# Patient Record
Sex: Female | Born: 1935 | Race: White | Hispanic: No | Smoking: Former smoker
Health system: Southern US, Community
[De-identification: ages and names within clinical notes are randomized; demographics above are authoritative.]

## PROBLEM LIST (undated history)

## (undated) DIAGNOSIS — M1990 Unspecified osteoarthritis, unspecified site: Secondary | ICD-10-CM

## (undated) DIAGNOSIS — E079 Disorder of thyroid, unspecified: Secondary | ICD-10-CM

## (undated) DIAGNOSIS — I639 Cerebral infarction, unspecified: Secondary | ICD-10-CM

## (undated) DIAGNOSIS — J449 Chronic obstructive pulmonary disease, unspecified: Secondary | ICD-10-CM

## (undated) DIAGNOSIS — H409 Unspecified glaucoma: Secondary | ICD-10-CM

## (undated) DIAGNOSIS — I10 Essential (primary) hypertension: Secondary | ICD-10-CM

## (undated) DIAGNOSIS — I499 Cardiac arrhythmia, unspecified: Secondary | ICD-10-CM

## (undated) DIAGNOSIS — M810 Age-related osteoporosis without current pathological fracture: Secondary | ICD-10-CM

## (undated) DIAGNOSIS — E785 Hyperlipidemia, unspecified: Secondary | ICD-10-CM

## (undated) HISTORY — PX: WRIST SURGERY: SHX841

## (undated) HISTORY — PX: COLONOSCOPY: SHX174

## (undated) HISTORY — DX: Age-related osteoporosis without current pathological fracture: M81.0

## (undated) HISTORY — DX: Unspecified glaucoma: H40.9

## (undated) HISTORY — DX: Disorder of thyroid, unspecified: E07.9

## (undated) HISTORY — DX: Chronic obstructive pulmonary disease, unspecified: J44.9

## (undated) HISTORY — DX: Unspecified osteoarthritis, unspecified site: M19.90

## (undated) HISTORY — DX: Essential (primary) hypertension: I10

## (undated) HISTORY — DX: Hyperlipidemia, unspecified: E78.5

## (undated) HISTORY — PX: OTHER SURGICAL HISTORY: SHX169

## (undated) HISTORY — DX: Cardiac arrhythmia, unspecified: I49.9

## (undated) HISTORY — PX: APPENDECTOMY: SHX54

---

## 2017-04-26 LAB — COMPREHENSIVE METABOLIC PANEL
Albumin: 4.2 (ref 3.5–5.0)
Calcium: 10.2 (ref 8.7–10.7)
GFR calc Af Amer: 85
GFR calc non Af Amer: 73
Globulin: 2.6

## 2017-04-26 LAB — CBC AND DIFFERENTIAL
HCT: 39 (ref 36–46)
Hemoglobin: 12.2 (ref 12.0–16.0)
Neutrophils Absolute: 5
WBC: 7.3

## 2017-04-26 LAB — BASIC METABOLIC PANEL
BUN: 12 (ref 4–21)
Chloride: 102 (ref 99–108)
Creatinine: 0.7 (ref 0.5–1.1)
Glucose: 90
Potassium: 4.9 (ref 3.4–5.3)
Sodium: 141 (ref 137–147)

## 2017-04-26 LAB — LIPID PANEL
Cholesterol: 139 (ref 0–200)
HDL: 55 (ref 35–70)
LDL Cholesterol: 57
Triglycerides: 133 (ref 40–160)

## 2017-04-26 LAB — CBC: RBC: 3.97 (ref 3.87–5.11)

## 2017-04-26 LAB — HEPATIC FUNCTION PANEL
ALT: 17 (ref 7–35)
AST: 30 (ref 13–35)
Bilirubin, Total: 0.3

## 2017-07-18 LAB — CBC AND DIFFERENTIAL
Neutrophils Absolute: 7
WBC: 8.4

## 2017-07-18 LAB — BASIC METABOLIC PANEL
BUN: 8 (ref 4–21)
Creatinine: 0.7 (ref 0.5–1.1)
Glucose: 94
Sodium: 140 (ref 137–147)

## 2017-07-18 LAB — HEPATIC FUNCTION PANEL: Bilirubin, Total: 0.2

## 2017-07-18 LAB — VITAMIN D 25 HYDROXY (VIT D DEFICIENCY, FRACTURES): Vit D, 25-Hydroxy: 37

## 2017-07-18 LAB — TSH: TSH: 0.4 — AB (ref 0.41–5.90)

## 2017-08-14 HISTORY — PX: UPPER GASTROINTESTINAL ENDOSCOPY: SHX188

## 2018-10-09 ENCOUNTER — Encounter: Payer: Self-pay | Admitting: Family Medicine

## 2018-10-09 ENCOUNTER — Ambulatory Visit (INDEPENDENT_AMBULATORY_CARE_PROVIDER_SITE_OTHER): Payer: Medicare Other

## 2018-10-09 ENCOUNTER — Ambulatory Visit (INDEPENDENT_AMBULATORY_CARE_PROVIDER_SITE_OTHER): Payer: Medicare Other | Admitting: Family Medicine

## 2018-10-09 VITALS — BP 148/68 | HR 73 | Temp 98.0°F | Ht 64.5 in | Wt 117.0 lb

## 2018-10-09 DIAGNOSIS — E88819 Insulin resistance, unspecified: Secondary | ICD-10-CM

## 2018-10-09 DIAGNOSIS — M546 Pain in thoracic spine: Secondary | ICD-10-CM | POA: Diagnosis not present

## 2018-10-09 DIAGNOSIS — K219 Gastro-esophageal reflux disease without esophagitis: Secondary | ICD-10-CM

## 2018-10-09 DIAGNOSIS — I10 Essential (primary) hypertension: Secondary | ICD-10-CM

## 2018-10-09 DIAGNOSIS — I351 Nonrheumatic aortic (valve) insufficiency: Secondary | ICD-10-CM

## 2018-10-09 DIAGNOSIS — E559 Vitamin D deficiency, unspecified: Secondary | ICD-10-CM | POA: Diagnosis not present

## 2018-10-09 DIAGNOSIS — E785 Hyperlipidemia, unspecified: Secondary | ICD-10-CM

## 2018-10-09 DIAGNOSIS — I4719 Other supraventricular tachycardia: Secondary | ICD-10-CM

## 2018-10-09 DIAGNOSIS — K589 Irritable bowel syndrome without diarrhea: Secondary | ICD-10-CM

## 2018-10-09 DIAGNOSIS — Z593 Problems related to living in residential institution: Secondary | ICD-10-CM

## 2018-10-09 DIAGNOSIS — I6529 Occlusion and stenosis of unspecified carotid artery: Secondary | ICD-10-CM

## 2018-10-09 DIAGNOSIS — H9193 Unspecified hearing loss, bilateral: Secondary | ICD-10-CM

## 2018-10-09 DIAGNOSIS — E8881 Metabolic syndrome: Secondary | ICD-10-CM

## 2018-10-09 DIAGNOSIS — I471 Supraventricular tachycardia: Secondary | ICD-10-CM

## 2018-10-09 DIAGNOSIS — J310 Chronic rhinitis: Secondary | ICD-10-CM

## 2018-10-09 DIAGNOSIS — G8929 Other chronic pain: Secondary | ICD-10-CM

## 2018-10-09 DIAGNOSIS — M810 Age-related osteoporosis without current pathological fracture: Secondary | ICD-10-CM

## 2018-10-09 DIAGNOSIS — R2681 Unsteadiness on feet: Secondary | ICD-10-CM

## 2018-10-09 DIAGNOSIS — Z8639 Personal history of other endocrine, nutritional and metabolic disease: Secondary | ICD-10-CM

## 2018-10-09 DIAGNOSIS — D509 Iron deficiency anemia, unspecified: Secondary | ICD-10-CM | POA: Diagnosis not present

## 2018-10-09 DIAGNOSIS — Z748 Other problems related to care provider dependency: Secondary | ICD-10-CM

## 2018-10-09 DIAGNOSIS — R5383 Other fatigue: Secondary | ICD-10-CM | POA: Diagnosis not present

## 2018-10-09 DIAGNOSIS — I361 Nonrheumatic tricuspid (valve) insufficiency: Secondary | ICD-10-CM

## 2018-10-09 DIAGNOSIS — M533 Sacrococcygeal disorders, not elsewhere classified: Secondary | ICD-10-CM

## 2018-10-09 DIAGNOSIS — F341 Dysthymic disorder: Secondary | ICD-10-CM

## 2018-10-09 LAB — CBC WITH DIFFERENTIAL/PLATELET
Basophils Absolute: 0 10*3/uL (ref 0.0–0.1)
Basophils Relative: 0.3 % (ref 0.0–3.0)
Eosinophils Absolute: 0.1 10*3/uL (ref 0.0–0.7)
Eosinophils Relative: 1.9 % (ref 0.0–5.0)
HCT: 39.2 % (ref 36.0–46.0)
Hemoglobin: 13.3 g/dL (ref 12.0–15.0)
Lymphocytes Relative: 20.3 % (ref 12.0–46.0)
Lymphs Abs: 1.6 10*3/uL (ref 0.7–4.0)
MCHC: 33.9 g/dL (ref 30.0–36.0)
MCV: 95.9 fl (ref 78.0–100.0)
Monocytes Absolute: 0.5 10*3/uL (ref 0.1–1.0)
Monocytes Relative: 6.9 % (ref 3.0–12.0)
Neutro Abs: 5.5 10*3/uL (ref 1.4–7.7)
Neutrophils Relative %: 70.6 % (ref 43.0–77.0)
Platelets: 229 10*3/uL (ref 150.0–400.0)
RBC: 4.09 Mil/uL (ref 3.87–5.11)
RDW: 13.1 % (ref 11.5–15.5)
WBC: 7.8 10*3/uL (ref 4.0–10.5)

## 2018-10-09 LAB — COMPREHENSIVE METABOLIC PANEL
ALT: 16 U/L (ref 0–35)
AST: 24 U/L (ref 0–37)
Albumin: 4.2 g/dL (ref 3.5–5.2)
Alkaline Phosphatase: 90 U/L (ref 39–117)
BUN: 19 mg/dL (ref 6–23)
CO2: 32 mEq/L (ref 19–32)
Calcium: 10.3 mg/dL (ref 8.4–10.5)
Chloride: 102 mEq/L (ref 96–112)
Creatinine, Ser: 0.81 mg/dL (ref 0.40–1.20)
GFR: 66.05 mL/min (ref >60.00)
Glucose, Bld: 95 mg/dL (ref 70–99)
Potassium: 4.1 mEq/L (ref 3.5–5.1)
Sodium: 140 mEq/L (ref 135–145)
Total Bilirubin: 0.3 mg/dL (ref 0.2–1.2)
Total Protein: 6.8 g/dL (ref 6.0–8.3)

## 2018-10-09 LAB — LIPID PANEL
Cholesterol: 160 mg/dL (ref 0–200)
HDL: 62.2 mg/dL (ref >39.00)
LDL Cholesterol: 66 mg/dL (ref 0–99)
NonHDL: 98.14
Total CHOL/HDL Ratio: 3
Triglycerides: 159 mg/dL — ABNORMAL HIGH (ref 0.0–149.0)
VLDL: 31.8 mg/dL (ref 0.0–40.0)

## 2018-10-09 IMAGING — DX DG THORACIC SPINE 3V
3 series · 3 of 3 positions shown · non-contrast
Comparison: None.

CLINICAL DATA: Chronic thoracic back pain.

EXAM:
THORACIC SPINE - 3 VIEWS

[thoracic spine ap]
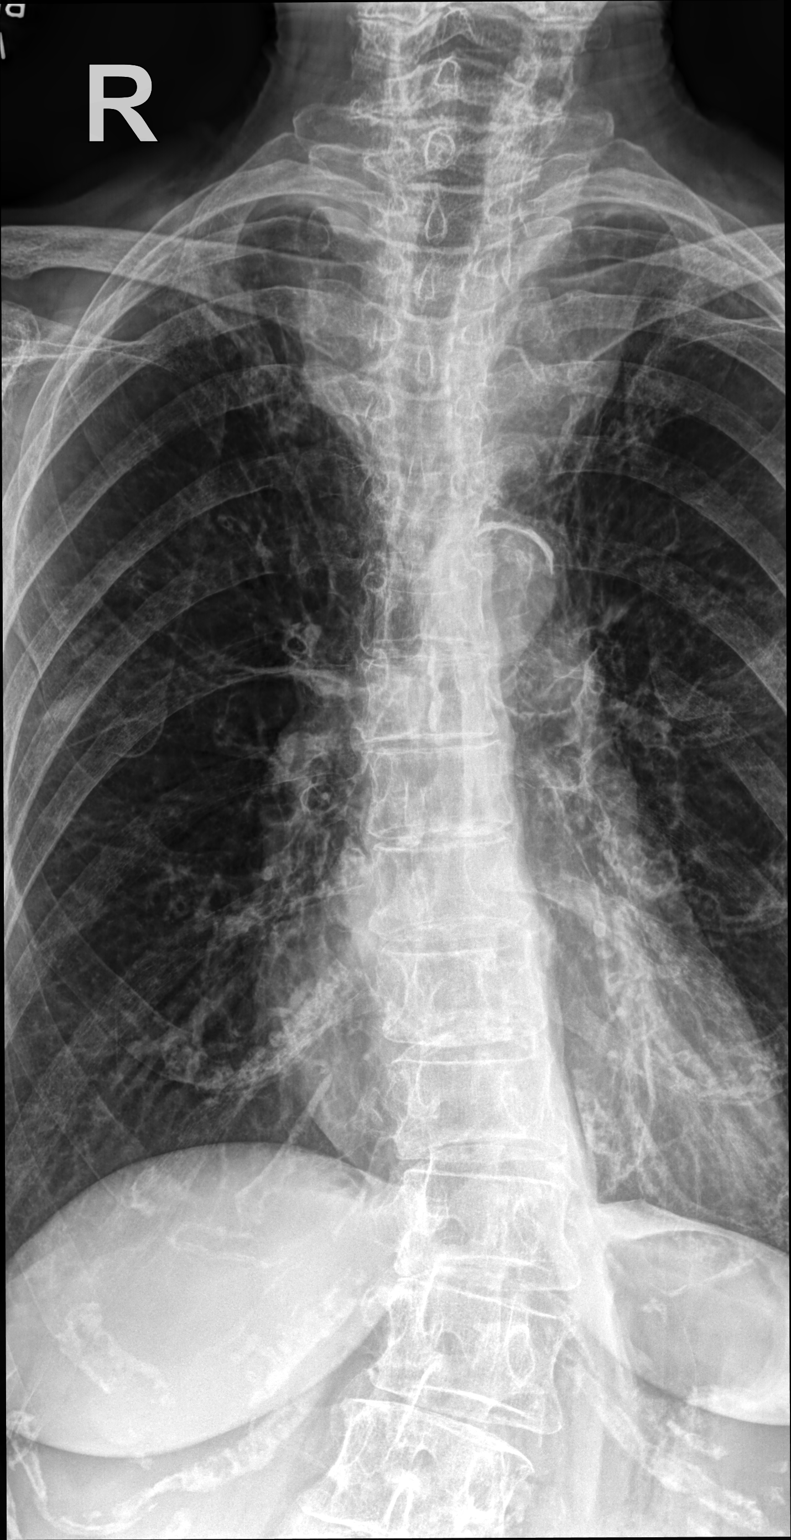

[thoracic spine lat (1 of 2)]
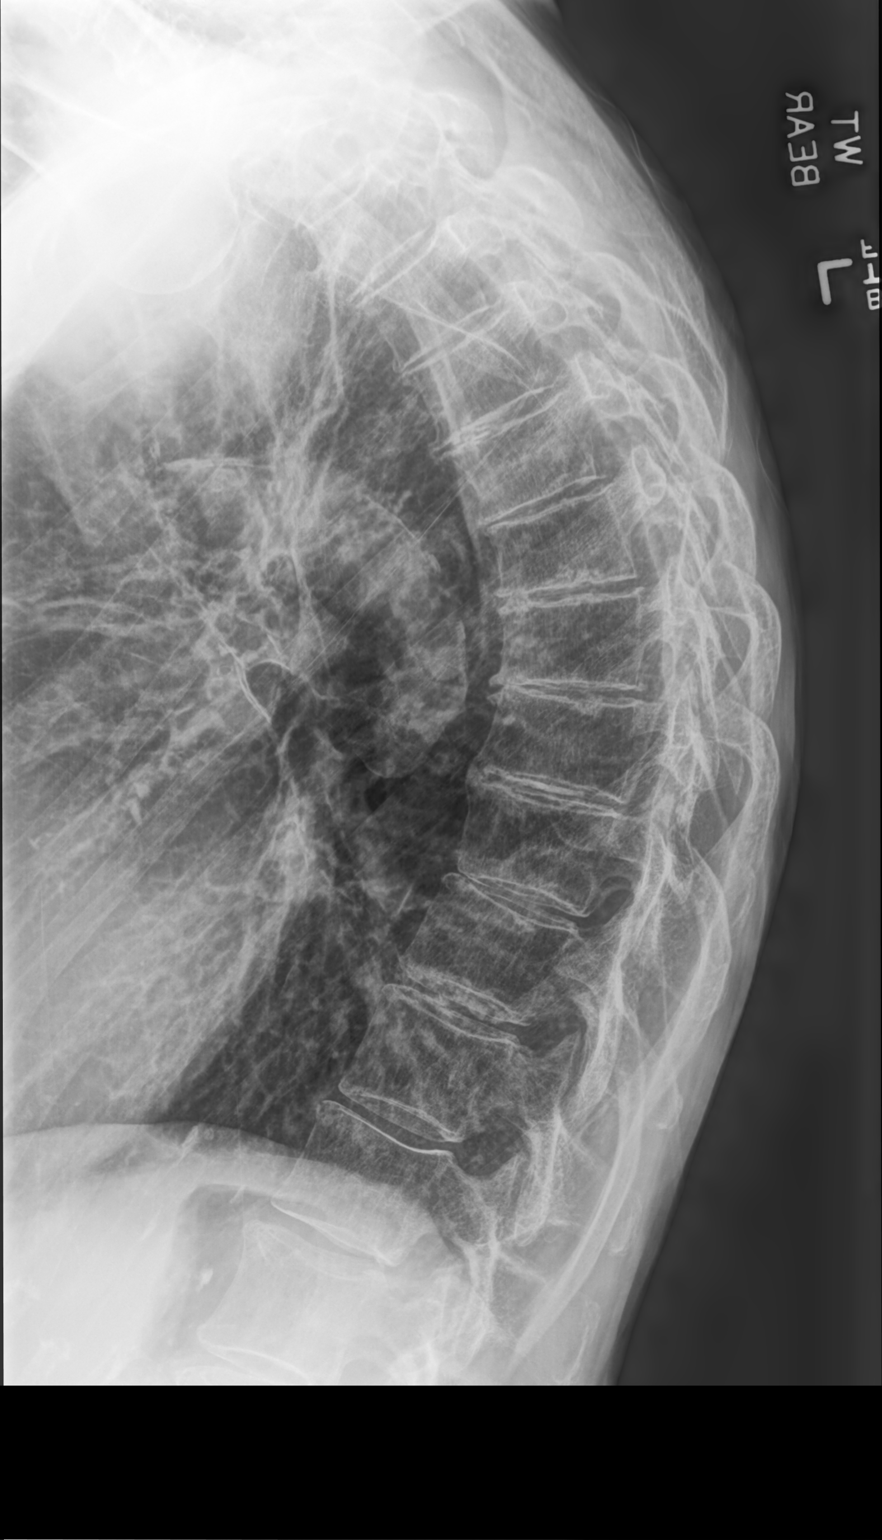

[thoracic spine lat (2 of 2)]
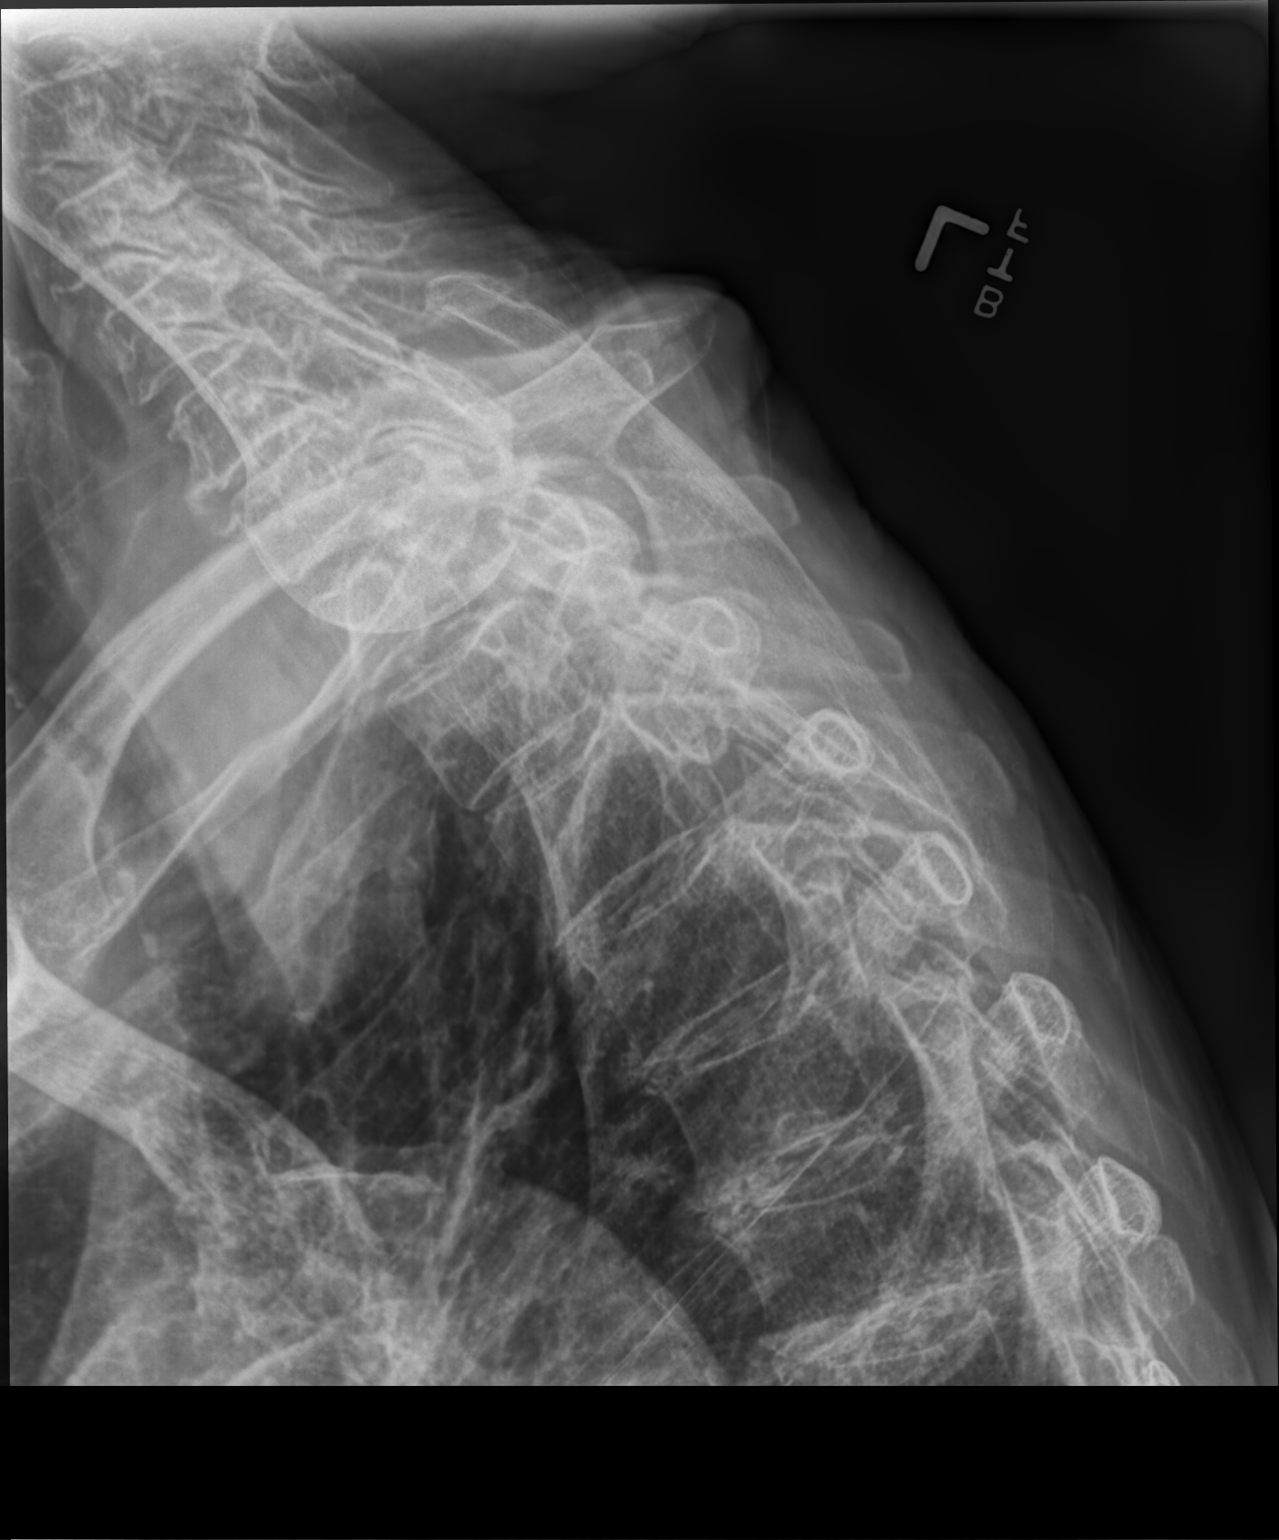

[3 of 3 positions shown; findings below may reference images not displayed]

FINDINGS: No fracture or spondylolisthesis is noted. Multilevel degenerative
disc disease is noted in midthoracic spine. Mild dextroscoliosis is
noted.
IMPRESSION: Mild multilevel degenerative disc disease. No acute abnormality seen
in the thoracic spine.

## 2018-10-09 IMAGING — DX DG LUMBAR SPINE 2-3V
3 series · 3 of 3 positions shown · non-contrast
Comparison: None.

CLINICAL DATA: Chronic lower back pain without known injury.

EXAM:
LUMBAR SPINE - 2-3 VIEW

[lumbar spine ap]
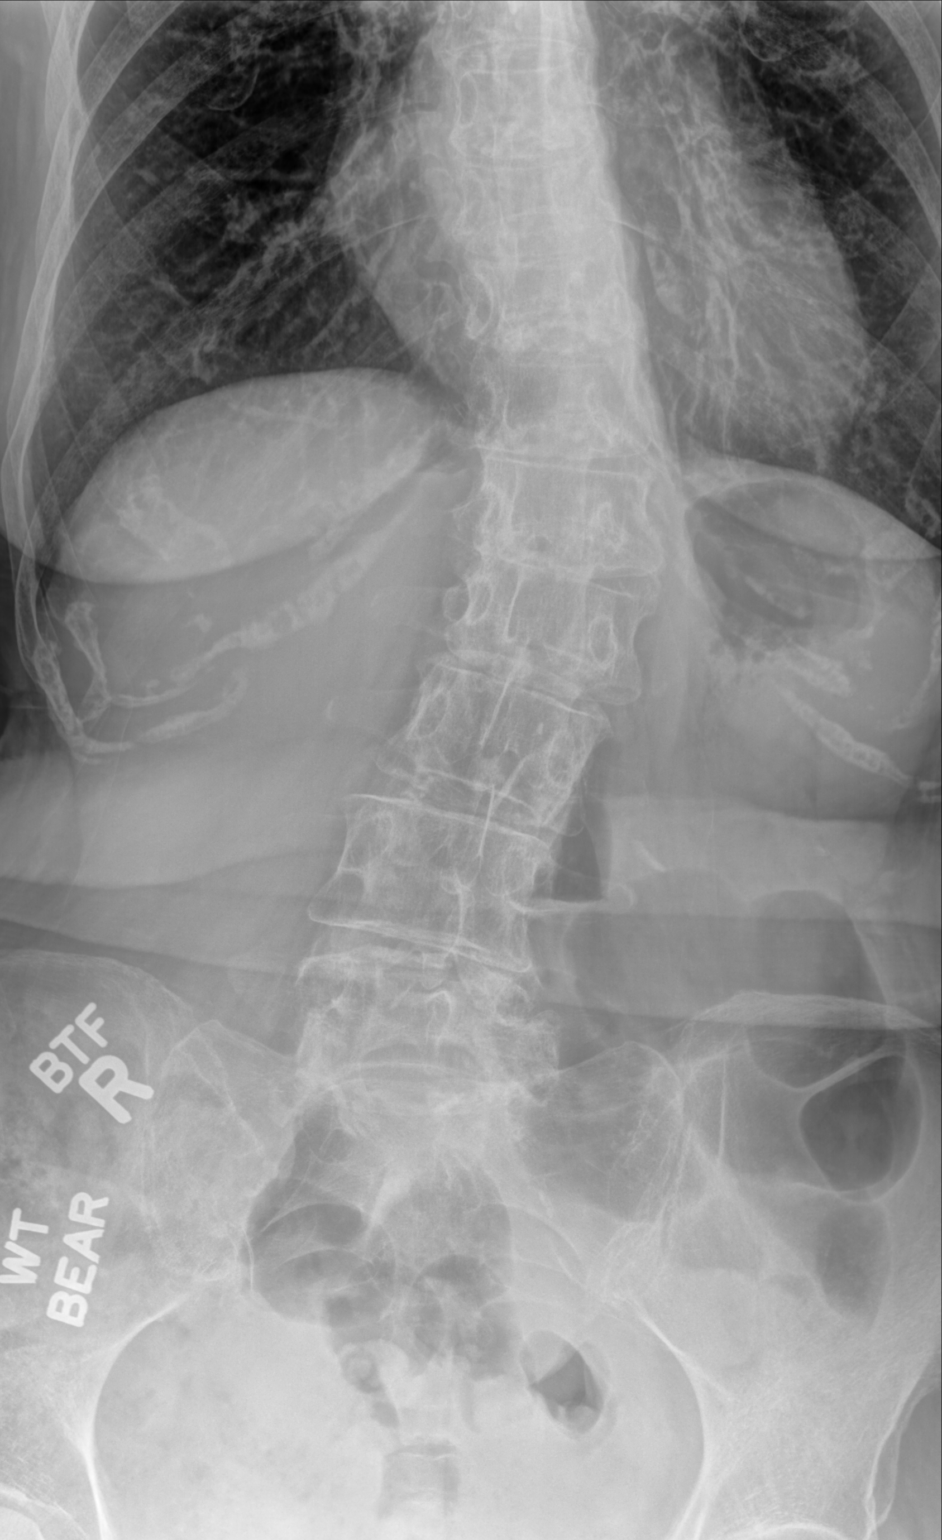

[lumbar spine lat (1 of 2)]
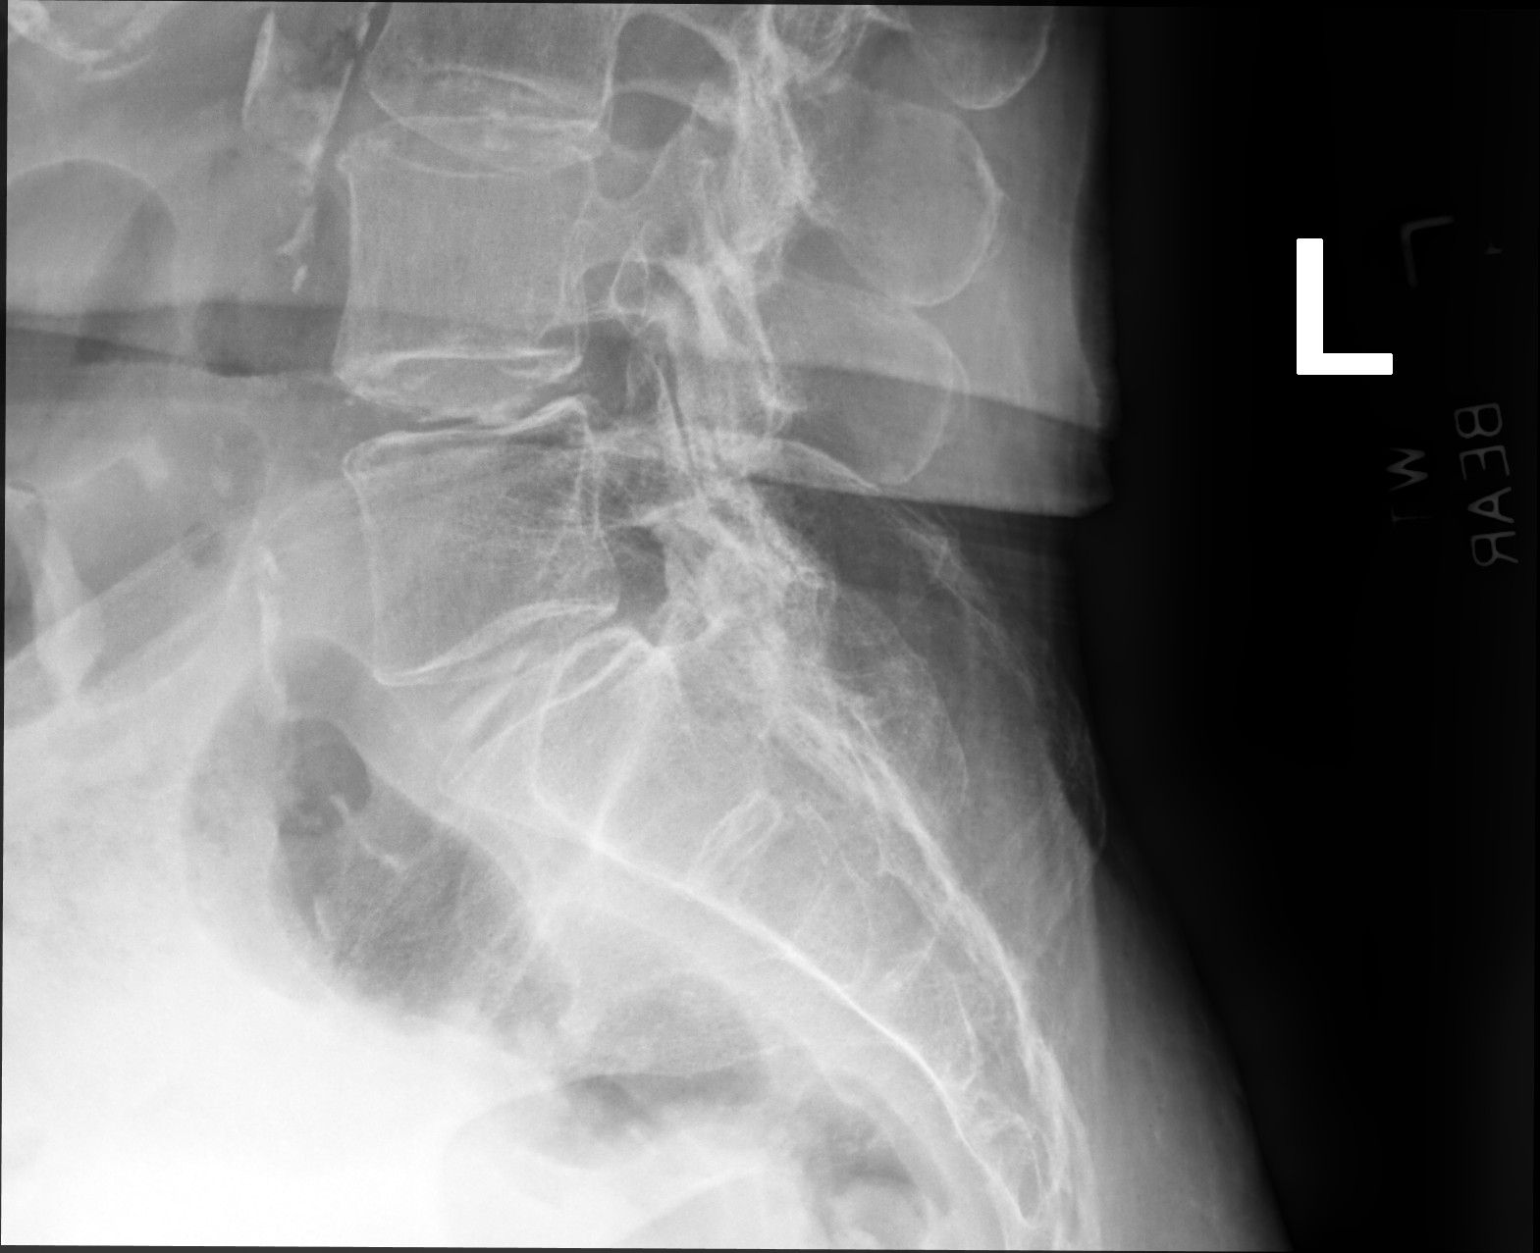

[lumbar spine lat (2 of 2)]
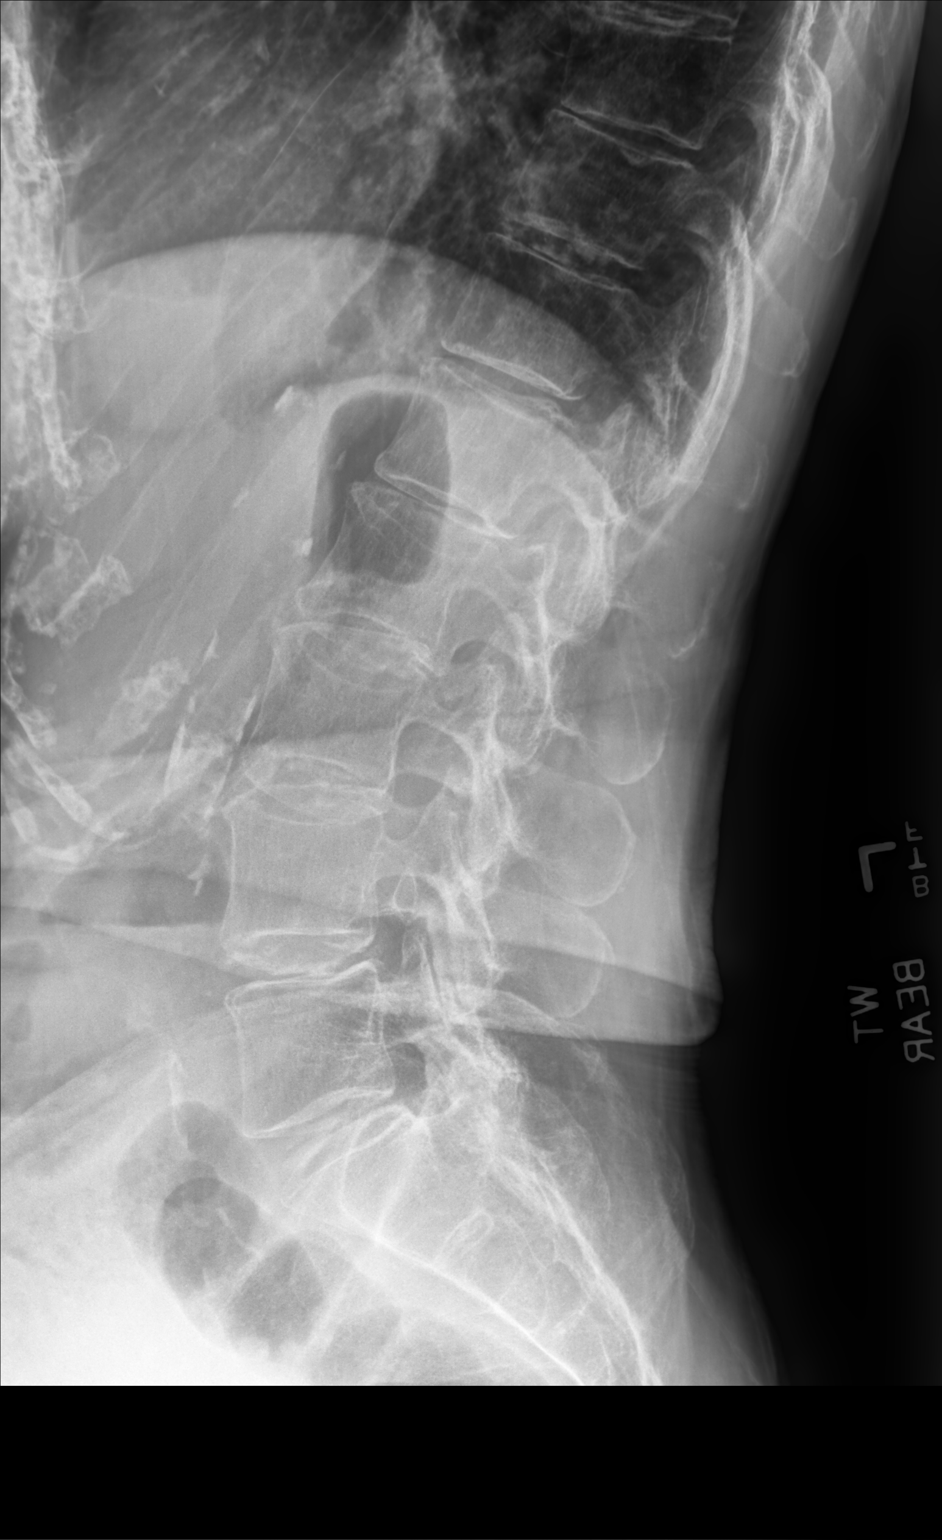

[3 of 3 positions shown; findings below may reference images not displayed]

FINDINGS: Moderate levoscoliosis of lower thoracic and upper lumbar spine is
noted. No fracture or spondylolisthesis is noted. Moderate
degenerative disc disease is noted at L1-2, L2-3 and L3-4.
Atherosclerosis of abdominal aorta is noted.
IMPRESSION: Moderate multilevel degenerative disc disease. No acute abnormality
seen in the lumbar spine.

## 2018-10-09 MED ORDER — ROSUVASTATIN CALCIUM 20 MG PO TABS: 20.0000 mg | ORAL_TABLET | Freq: Every day | ORAL | 1 refills | Status: DC

## 2018-10-09 MED ORDER — SOTALOL HCL 80 MG PO TABS: 80.0000 mg | ORAL_TABLET | Freq: Two times a day (BID) | ORAL | 3 refills | Status: DC

## 2018-10-09 NOTE — Progress Notes (Signed)
Linda [name] is a 85 y.o. female is here to establish care.  History of Present Illness:   HPI: See Assessment and Plan section for Problem Based Charting of issues discussed today.   Health Maintenance Due  Topic Date Due  . TETANUS/TDAP  03/11/1943  . DEXA SCAN  03/12/1991  . PNA vac Low Risk Adult (1 of 2 - PCV13) 03/12/1991  . INFLUENZA VACCINE  03/14/2018   No flowsheet data found. PMHx, SurgHx, SocialHx, FamHx, Medications, and Allergies were reviewed in the Visit Navigator and updated as appropriate.  There are no active problems to display for this patient.  Social History   Tobacco Use  . Smoking status: Former Smoker    Types: Cigarettes    Start date: 1950    Last attempt to quit: 1952    Years since quitting: 68.2  . Smokeless tobacco: Never Used  Substance Use Topics  . Alcohol use: Not Currently    Frequency: Never  . Drug use: Never   Current Medications and Allergies:   Current Outpatient Medications:  .  acetaminophen (TYLENOL) 325 MG tablet, Take 650 mg by mouth every 6 (six) hours as needed., Disp: , Rfl:  .  aspirin EC 81 MG tablet, Take 81 mg by mouth daily., Disp: , Rfl:  .  calcium carbonate (OSCAL) 1500 (600 Ca) MG TABS tablet, Take by mouth 2 (two) times daily with a meal., Disp: , Rfl:  .  calcium-vitamin D (OSCAL WITH D) 500-200 MG-UNIT tablet, Take 1 tablet by mouth., Disp: , Rfl:  .  ciclopirox (LOPROX) 0.77 % cream, Apply topically 2 (two) times daily., Disp: , Rfl:  .  clobetasol cream (TEMOVATE) 0.05 %, Apply 1 application topically as needed., Disp: , Rfl:  .  famotidine (PEPCID) 20 MG tablet, Take 20 mg by mouth as needed for heartburn or indigestion., Disp: , Rfl:  .  fluticasone (VERAMYST) 27.5 MCG/SPRAY nasal spray, Place 2 sprays into the nose daily., Disp: , Rfl:  .  Multiple Vitamin (MULTIVITAMIN) tablet, Take 1 tablet by mouth daily., Disp: , Rfl:  .  rosuvastatin (CRESTOR) 20 MG tablet, Take 1 tablet (20 mg total) by mouth  daily., Disp: 90 tablet, Rfl: 1 .  sotalol (BETAPACE) 80 MG tablet, Take 1 tablet (80 mg total) by mouth every 12 (twelve) hours., Disp: 180 tablet, Rfl: 3  No Known Allergies Review of Systems   Pertinent items are noted in the HPI. Otherwise, ROS is negative.  Vitals:   Vitals:   10/09/18 1315  BP: (!) 148/68  Pulse: 73  Temp: 98 F (36.7 C)  TempSrc: Oral  SpO2: 96%  Weight: 117 lb (53.1 kg)  Height: 5' 4.5" (1.638 m)     Body mass index is 19.77 kg/m.  Physical Exam:   Physical Exam Vitals signs and nursing note reviewed.  Constitutional:      General: She is not in acute distress.    Appearance: Normal appearance.  HENT:     Head: Normocephalic and atraumatic.     Right Ear: Tympanic membrane normal.     Left Ear: Tympanic membrane normal.     Nose: Nose normal.     Mouth/Throat:     Mouth: Mucous membranes are moist.  Eyes:     Pupils: Pupils are equal, round, and reactive to light.  Neck:     Musculoskeletal: Normal range of motion and neck supple.  Cardiovascular:     Rate and Rhythm: Normal rate and regular rhythm.  Pulmonary:     Effort: Pulmonary effort is normal.  Abdominal:     Palpations: Abdomen is soft.  Skin:    General: Skin is warm.     Capillary Refill: Capillary refill takes less than 2 seconds.  Neurological:     General: No focal deficit present.     Mental Status: She is alert.  Psychiatric:        Mood and Affect: Mood normal.        Behavior: Behavior normal.    Assessment and Plan:   Patient has long PMHx that we reviewed together today. Medications refilled. Labs ordered since overdue. Patient lives in ALF. From [place]. Just had to stop driving due to decreased vision. Seems resentful. Daughter is with her and supportive, but quiet. Patient's main complaint is chronic TL back pain since fall onto tailbone last year. She says that no one could tell her what was wrong or help her. Will get records. New images today. Will order PT  for ALF. Offered [employer] classes. Will set up for [facility].   Records requested if needed. Time spent with the patient: 45 minutes, of which >50% was spent in obtaining information about her symptoms, reviewing her previous labs, evaluations, and treatments, counseling her about her condition (please see the discussed topics above), and developing a plan to further investigate it; she had a number of questions which I addressed.   1. Thoracic back pain, unspecified back pain laterality, unspecified chronicity   2. Other fatigue   3. Hyperlipidemia, unspecified hyperlipidemia type   4. Vitamin D deficiency   5. Iron deficiency anemia, unspecified iron deficiency anemia type   6. Gastroesophageal reflux disease without esophagitis   7. Chronic midline thoracic back pain   8. Osteoporosis without current pathological fracture, unspecified osteoporosis type   9. Essential hypertension   10. Stenosis of carotid artery, unspecified laterality   11. History of thyroid nodule, s/p bx, benign   12. Living in assisted living   13. Aortic cusp regurgitation   14. Nonrheumatic tricuspid valve regurgitation   15. Coccyalgia   16. Insulin resistance   17. Gait instability   18. Assistance needed with transportation, no longer drives due to vision   19. Irritable bowel syndrome, unspecified type   20. Bilateral hearing loss, unspecified hearing loss type   21. Dysthymia   22. Chronic rhinitis   23. Paroxysmal atrial tachycardia (HCC)    Orders Placed This Encounter  Procedures  . DG Lumbar Spine 2-3 Views  . DG Thoracic Spine W/Swimmers  . CBC with Differential/Platelet  . Comprehensive metabolic panel  . Lipid panel  . TSH  . VITAMIN D 25 Hydroxy (Vit-D Deficiency, Fractures)  . IBC + Ferritin  . Ambulatory referral to Physical Therapy   Meds ordered this encounter  Medications  . rosuvastatin (CRESTOR) 20 MG tablet    Sig: Take 1 tablet (20 mg total) by mouth daily.    Dispense:  90 tablet     Refill:  1  . sotalol (BETAPACE) 80 MG tablet    Sig: Take 1 tablet (80 mg total) by mouth every 12 (twelve) hours.    Dispense:  180 tablet    Refill:  3    . Reviewed expectations re: course of current medical issues. . Discussed self-management of symptoms. . Outlined signs and symptoms indicating need for more acute intervention. . Patient verbalized understanding and all questions were answered. Marland Kitchen Health Maintenance issues including appropriate healthy diet, exercise, and smoking  avoidance were discussed with patient. . See orders for this visit as documented in the electronic medical record. . Patient received an After Visit Summary.  Helane Rima, DO [place] 10/13/2018

## 2018-10-10 LAB — IBC + FERRITIN
Ferritin: 48.6 ng/mL (ref 10.0–291.0)
Iron: 85 ug/dL (ref 42–145)
Saturation Ratios: 20.5 % (ref 20.0–50.0)
Transferrin: 296 mg/dL (ref 212.0–360.0)

## 2018-10-10 LAB — VITAMIN D 25 HYDROXY (VIT D DEFICIENCY, FRACTURES): VITD: 45.45 ng/mL (ref 30.00–100.00)

## 2018-10-10 LAB — TSH: TSH: 0.45 u[IU]/mL (ref 0.35–4.50)

## 2018-10-13 ENCOUNTER — Encounter: Payer: Self-pay | Admitting: Family Medicine

## 2018-10-13 DIAGNOSIS — K589 Irritable bowel syndrome without diarrhea: Secondary | ICD-10-CM | POA: Insufficient documentation

## 2018-10-13 DIAGNOSIS — K219 Gastro-esophageal reflux disease without esophagitis: Secondary | ICD-10-CM | POA: Insufficient documentation

## 2018-10-13 DIAGNOSIS — D509 Iron deficiency anemia, unspecified: Secondary | ICD-10-CM | POA: Insufficient documentation

## 2018-10-13 DIAGNOSIS — M810 Age-related osteoporosis without current pathological fracture: Secondary | ICD-10-CM | POA: Insufficient documentation

## 2018-10-13 DIAGNOSIS — Z593 Problems related to living in residential institution: Secondary | ICD-10-CM | POA: Insufficient documentation

## 2018-10-13 DIAGNOSIS — I351 Nonrheumatic aortic (valve) insufficiency: Secondary | ICD-10-CM | POA: Insufficient documentation

## 2018-10-13 DIAGNOSIS — Z748 Other problems related to care provider dependency: Secondary | ICD-10-CM | POA: Insufficient documentation

## 2018-10-13 DIAGNOSIS — I10 Essential (primary) hypertension: Secondary | ICD-10-CM | POA: Insufficient documentation

## 2018-10-13 DIAGNOSIS — E88819 Insulin resistance, unspecified: Secondary | ICD-10-CM | POA: Insufficient documentation

## 2018-10-13 DIAGNOSIS — M533 Sacrococcygeal disorders, not elsewhere classified: Secondary | ICD-10-CM | POA: Insufficient documentation

## 2018-10-13 DIAGNOSIS — F341 Dysthymic disorder: Secondary | ICD-10-CM | POA: Insufficient documentation

## 2018-10-13 DIAGNOSIS — I4719 Other supraventricular tachycardia: Secondary | ICD-10-CM | POA: Insufficient documentation

## 2018-10-13 DIAGNOSIS — E785 Hyperlipidemia, unspecified: Secondary | ICD-10-CM | POA: Insufficient documentation

## 2018-10-13 DIAGNOSIS — Z8639 Personal history of other endocrine, nutritional and metabolic disease: Secondary | ICD-10-CM | POA: Insufficient documentation

## 2018-10-13 DIAGNOSIS — I361 Nonrheumatic tricuspid (valve) insufficiency: Secondary | ICD-10-CM | POA: Insufficient documentation

## 2018-10-13 DIAGNOSIS — M546 Pain in thoracic spine: Secondary | ICD-10-CM | POA: Insufficient documentation

## 2018-10-13 DIAGNOSIS — E559 Vitamin D deficiency, unspecified: Secondary | ICD-10-CM | POA: Insufficient documentation

## 2018-10-13 DIAGNOSIS — J310 Chronic rhinitis: Secondary | ICD-10-CM | POA: Insufficient documentation

## 2018-10-13 DIAGNOSIS — H9193 Unspecified hearing loss, bilateral: Secondary | ICD-10-CM | POA: Insufficient documentation

## 2018-10-13 DIAGNOSIS — G8929 Other chronic pain: Secondary | ICD-10-CM | POA: Insufficient documentation

## 2018-10-13 DIAGNOSIS — I6529 Occlusion and stenosis of unspecified carotid artery: Secondary | ICD-10-CM | POA: Insufficient documentation

## 2018-10-13 DIAGNOSIS — E8881 Metabolic syndrome: Secondary | ICD-10-CM | POA: Insufficient documentation

## 2018-10-13 DIAGNOSIS — R5383 Other fatigue: Secondary | ICD-10-CM | POA: Insufficient documentation

## 2018-10-13 DIAGNOSIS — R2681 Unsteadiness on feet: Secondary | ICD-10-CM | POA: Insufficient documentation

## 2018-10-13 DIAGNOSIS — I471 Supraventricular tachycardia: Secondary | ICD-10-CM | POA: Insufficient documentation

## 2018-10-15 ENCOUNTER — Other Ambulatory Visit: Payer: Self-pay

## 2018-10-15 DIAGNOSIS — R059 Cough, unspecified: Secondary | ICD-10-CM

## 2018-10-15 DIAGNOSIS — M546 Pain in thoracic spine: Secondary | ICD-10-CM

## 2018-10-15 DIAGNOSIS — I10 Essential (primary) hypertension: Secondary | ICD-10-CM

## 2018-10-15 DIAGNOSIS — R05 Cough: Secondary | ICD-10-CM

## 2018-10-30 ENCOUNTER — Telehealth: Payer: Self-pay

## 2018-10-30 NOTE — Telephone Encounter (Signed)
-----   Message from Helane Rima, DO sent at 10/14/2018  6:25 AM EST ----- All good. Plus PT for ALF. ----- Message ----- From: Donnamarie Poag, CMA Sent: 10/10/2018  11:48 AM EST To: Helane Rima, DO  What other referral did I need to send for patient I know they need the following Cardiologist  GI  Pulmonology  Neurologist  Ortho Is that all?

## 2018-11-07 ENCOUNTER — Telehealth: Payer: Self-pay | Admitting: Family Medicine

## 2018-11-07 NOTE — Telephone Encounter (Signed)
Called pt and left vm to schedule webex.  °

## 2018-11-07 NOTE — Telephone Encounter (Signed)
Copied from CRM [id number]. Topic: General - Other >> Nov 07, 2018 10:00 AM Linda [name] wrote: Reason for CRM: Patient called to inform the doctor that her cough has flared up and she would like for the doctor to call in a script for her.  She does not want to come into the office.  CB# [phone]

## 2018-11-07 NOTE — Telephone Encounter (Signed)
Please call pt and schedule Webex visit with Dr. Earlene Plater or Lelon Mast for tomorrow for cough. Pt has to be evaluated can not just send in medication.

## 2018-11-07 NOTE — Telephone Encounter (Signed)
See note

## 2018-11-08 ENCOUNTER — Encounter: Payer: Self-pay | Admitting: Physician Assistant

## 2018-11-08 ENCOUNTER — Ambulatory Visit (INDEPENDENT_AMBULATORY_CARE_PROVIDER_SITE_OTHER): Payer: Medicare Other | Admitting: Physician Assistant

## 2018-11-08 DIAGNOSIS — R05 Cough: Secondary | ICD-10-CM | POA: Diagnosis not present

## 2018-11-08 DIAGNOSIS — R059 Cough, unspecified: Secondary | ICD-10-CM

## 2018-11-08 NOTE — Telephone Encounter (Signed)
Pt called and left a voicemail for someone to call her back about scheduling

## 2018-11-08 NOTE — Progress Notes (Signed)
TELEPHONE ENCOUNTER   Patient verbally agreed to telephone visit and is aware that copayment and coinsurance may apply. Patient was treated using telemedicine according to accepted telemedicine protocols.  Location of the patient: home Location of provider: office, [facility] location Names of all persons participating in the telemedicine service and role in the encounter: Jarold Motto, PA; Jamesetta So Domke  Subjective:   Chief Complaint  Patient presents with  . Cough     HPI   Patient reports 1 week history of cough.  She denies: Fever, chills, body aches, shortness of breath, abdominal pain, nausea, vomiting, diarrhea, sinus pain, ear pain, headache, inability to keep p.o.'s down  The only thing that she has tried for cough thus far is her Flonase nasal spray.  She has not been around anyone with sick symptoms.    Patient Active Problem List   Diagnosis Date Noted  . Thoracic back pain 10/13/2018  . Other fatigue 10/13/2018  . Hyperlipidemia 10/13/2018  . Vitamin D deficiency 10/13/2018  . Iron deficiency anemia 10/13/2018  . Gastroesophageal reflux disease without esophagitis 10/13/2018  . Chronic midline thoracic back pain 10/13/2018  . Osteoporosis without current pathological fracture 10/13/2018  . Essential hypertension 10/13/2018  . Stenosis of carotid artery 10/13/2018  . History of thyroid nodule, s/p bx, benign 10/13/2018  . Living in assisted living 10/13/2018  . Aortic cusp regurgitation 10/13/2018  . Nonrheumatic tricuspid valve regurgitation 10/13/2018  . Coccyalgia 10/13/2018  . Insulin resistance 10/13/2018  . Gait instability 10/13/2018  . Assistance needed with transportation, no longer drives due to vision 10/13/2018  . Irritable bowel syndrome 10/13/2018  . Bilateral hearing loss 10/13/2018  . Dysthymia 10/13/2018  . Chronic rhinitis 10/13/2018  . Paroxysmal atrial tachycardia (HCC) 10/13/2018   Social History   Tobacco Use  .  Smoking status: Former Smoker    Types: Cigarettes    Start date: 1950    Last attempt to quit: 1952    Years since quitting: 68.2  . Smokeless tobacco: Never Used  Substance Use Topics  . Alcohol use: Not Currently    Frequency: Never    Current Outpatient Medications:  .  acetaminophen (TYLENOL) 325 MG tablet, Take 650 mg by mouth every 6 (six) hours as needed., Disp: , Rfl:  .  aspirin EC 81 MG tablet, Take 81 mg by mouth daily., Disp: , Rfl:  .  calcium carbonate (OSCAL) 1500 (600 Ca) MG TABS tablet, Take by mouth 2 (two) times daily with a meal., Disp: , Rfl:  .  calcium-vitamin D (OSCAL WITH D) 500-200 MG-UNIT tablet, Take 1 tablet by mouth., Disp: , Rfl:  .  ciclopirox (LOPROX) 0.77 % cream, Apply topically 2 (two) times daily., Disp: , Rfl:  .  clobetasol cream (TEMOVATE) 0.05 %, Apply 1 application topically as needed., Disp: , Rfl:  .  famotidine (PEPCID) 20 MG tablet, Take 20 mg by mouth as needed for heartburn or indigestion., Disp: , Rfl:  .  FLUAD 0.5 ML SUSY, , Disp: , Rfl:  .  fluticasone (FLONASE) 50 MCG/ACT nasal spray, , Disp: , Rfl:  .  fluticasone (VERAMYST) 27.5 MCG/SPRAY nasal spray, Place 2 sprays into the nose daily., Disp: , Rfl:  .  Multiple Vitamin (MULTIVITAMIN) tablet, Take 1 tablet by mouth daily., Disp: , Rfl:  .  ranitidine (ZANTAC) 150 MG tablet, , Disp: , Rfl:  .  rosuvastatin (CRESTOR) 20 MG tablet, Take 1 tablet (20 mg total) by mouth daily., Disp: 90 tablet,  Rfl: 1 .  sotalol (BETAPACE) 80 MG tablet, Take 1 tablet (80 mg total) by mouth every 12 (twelve) hours., Disp: 180 tablet, Rfl: 3 .  Sotalol HCl AF (SOTALOL HYDROCHLORIDE) 80 MG TABS, , Disp: , Rfl:  No Known Allergies  While on the phone with patient, she was speaking clear sentences without any perceived shortness of breath.  She checked temperature on the phone and was 97.2.  No coughing spells on phone with patient.  Assessment & Plan:   1. Cough     No red flags on exam.  Discussed  supportive care including Delsym and or Mucinex, or may also try Coricidin products, to help with her symptoms.  While on the phone with patient, she was speaking clear sentences without any perceived shortness of breath.  No indication for antibiotics at this time.  Reviewed return precautions including worsening fever, SOB, worsening cough or other concerns. Push fluids and rest. I recommend that patient follow-up if symptoms worsen or persist despite treatment x 7-10 days, sooner if needed.  No orders of the defined types were placed in this encounter.  No orders of the defined types were placed in this encounter.   Jarold Motto, [place] 11/08/2018  Time spent with the patient: 7 minutes, spent in obtaining information about her symptoms, reviewing her previous labs, evaluations, and treatments, counseling her about her condition (please see the discussed topics above), and developing a plan to further investigate it; she had a number of questions which I addressed.

## 2018-11-08 NOTE — Telephone Encounter (Signed)
Spoke with pt, she reports worsening cough, productive/brown. Denies fever, body aches, chills, abd pain, n/v/d, sinus/ear pain/pressure, HA.   Similar sx in the past, improved x 4 months w/ Zpac.   OK to schedule for telephone visit per Jarold Motto, PA.

## 2018-11-22 ENCOUNTER — Ambulatory Visit: Payer: Self-pay | Admitting: Family Medicine

## 2018-11-22 NOTE — Telephone Encounter (Signed)
Pt. Reports she is still coughing and can't remember what Dr. Earlene Plater told her to try. Instructed her she said she could try Delsym, Mucinex or Coricidin. Not all of these though. She will check with her pharmacist.

## 2018-12-26 ENCOUNTER — Encounter: Payer: Self-pay | Admitting: Family Medicine

## 2019-01-24 ENCOUNTER — Telehealth: Payer: Self-pay | Admitting: Family Medicine

## 2019-01-24 NOTE — Telephone Encounter (Signed)
Copied from [place] [id number]. Topic: Referral - Request for Referral >> Jan 24, 2019 11:42 AM Gustavus Messing wrote: Has patient seen PCP for this complaint? Yes. In [place] *If NO, is insurance requiring patient see PCP for this issue before PCP can refer them? Referral for which specialty: Cardiologist  Preferred provider/office: [facility] Reason for referral: Daughter thinks that she needs to have a cardiologist because she had one in [place]

## 2019-01-28 NOTE — Telephone Encounter (Signed)
Pt returned call to office. Pt stated she does not need a referral for her insurance but she would like to know which cardiologist she was referred to. Pt requests call back

## 2019-01-28 NOTE — Telephone Encounter (Signed)
Referral was placed back in march office was not able to contact to make app. Does she want to resend it? I have l/m to call office back and let us know.

## 2019-01-29 NOTE — Telephone Encounter (Signed)
She was referred to [facility] on [address]. Please call and notify patient.

## 2019-01-29 NOTE — Telephone Encounter (Signed)
Left message on voicemail to inform patient about referral.

## 2019-02-03 ENCOUNTER — Ambulatory Visit (INDEPENDENT_AMBULATORY_CARE_PROVIDER_SITE_OTHER): Payer: Medicare Other | Admitting: Emergency Medicine

## 2019-02-03 ENCOUNTER — Encounter: Payer: Self-pay | Admitting: Emergency Medicine

## 2019-02-03 ENCOUNTER — Other Ambulatory Visit: Payer: Self-pay

## 2019-02-03 ENCOUNTER — Ambulatory Visit (INDEPENDENT_AMBULATORY_CARE_PROVIDER_SITE_OTHER): Payer: Medicare Other

## 2019-02-03 DIAGNOSIS — R05 Cough: Secondary | ICD-10-CM | POA: Diagnosis not present

## 2019-02-03 DIAGNOSIS — R053 Chronic cough: Secondary | ICD-10-CM | POA: Insufficient documentation

## 2019-02-03 DIAGNOSIS — I6529 Occlusion and stenosis of unspecified carotid artery: Secondary | ICD-10-CM

## 2019-02-03 IMAGING — DX CHEST - 2 VIEW
3 series · 3 of 3 positions shown · non-contrast
Comparison: [DATE]

CLINICAL DATA: [AGE] female with a history of cough

EXAM:
CHEST - 2 VIEW

[chest pa (1 of 2)]
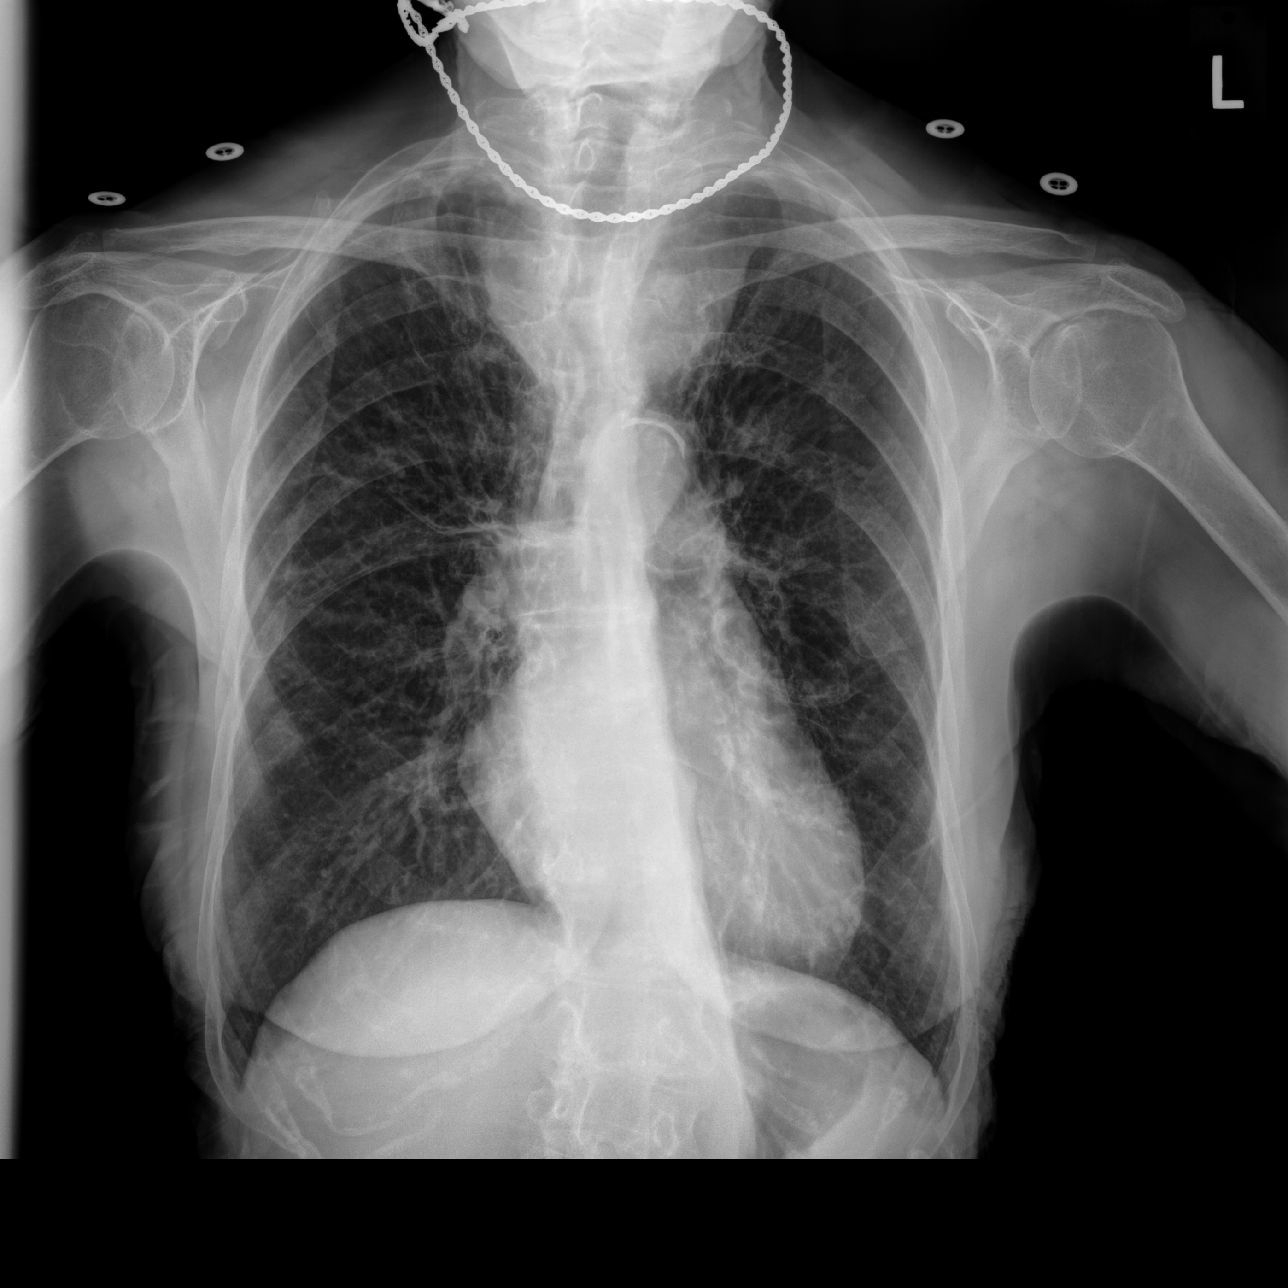

[chest pa (2 of 2)]
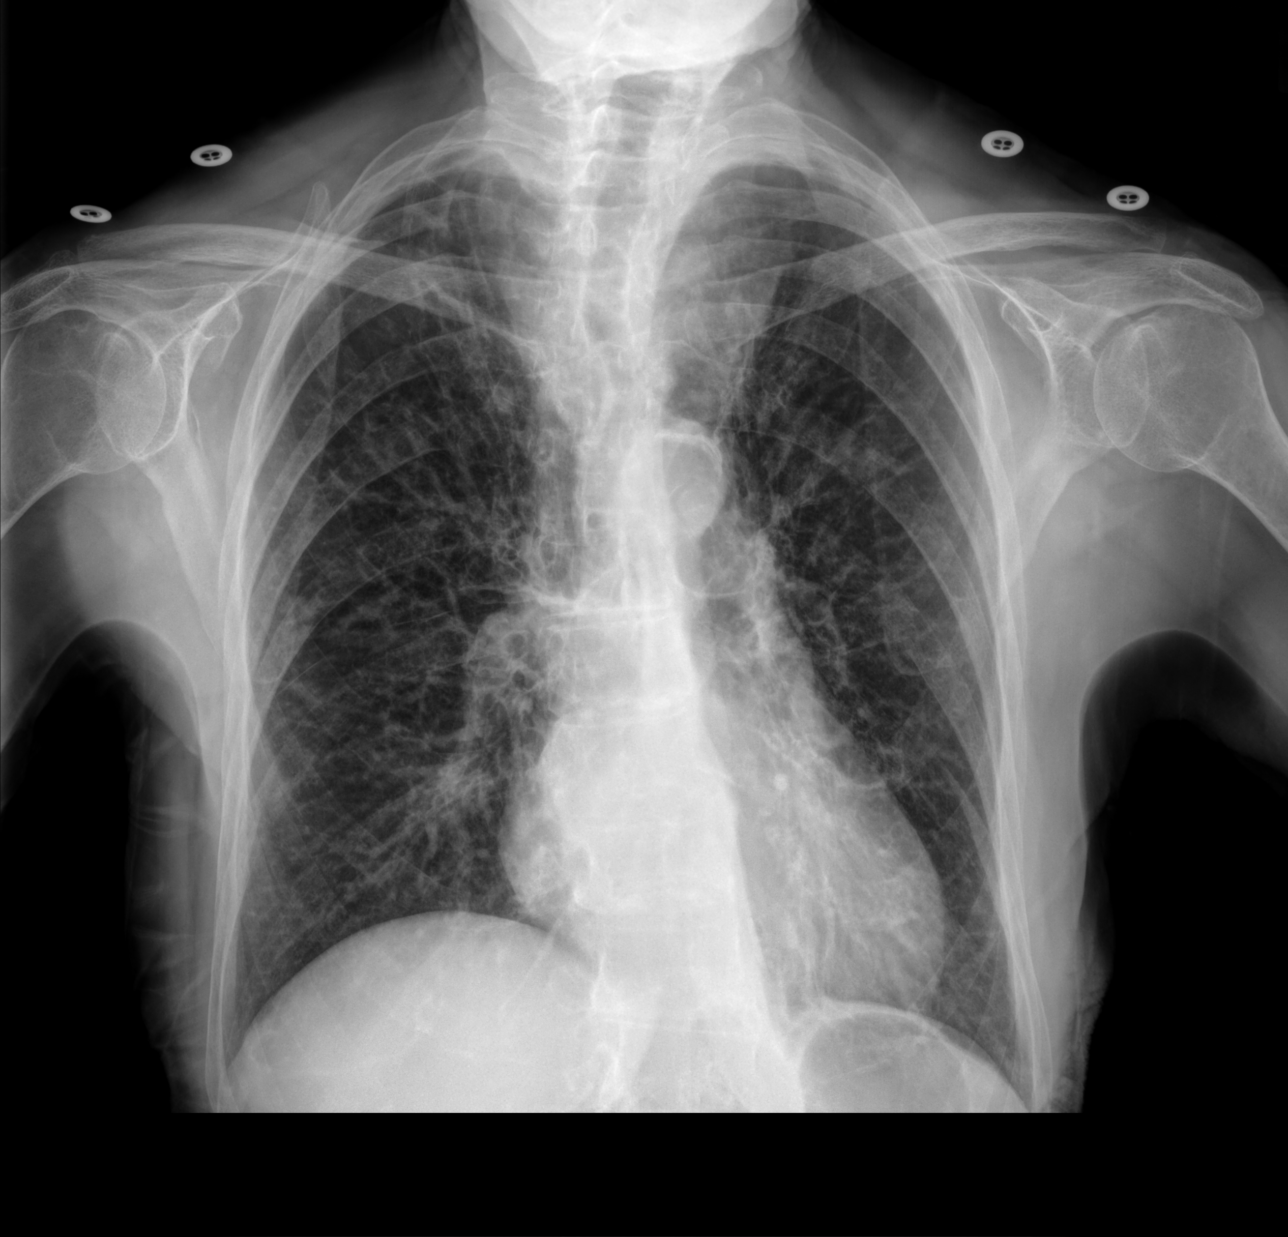

[chest lat]
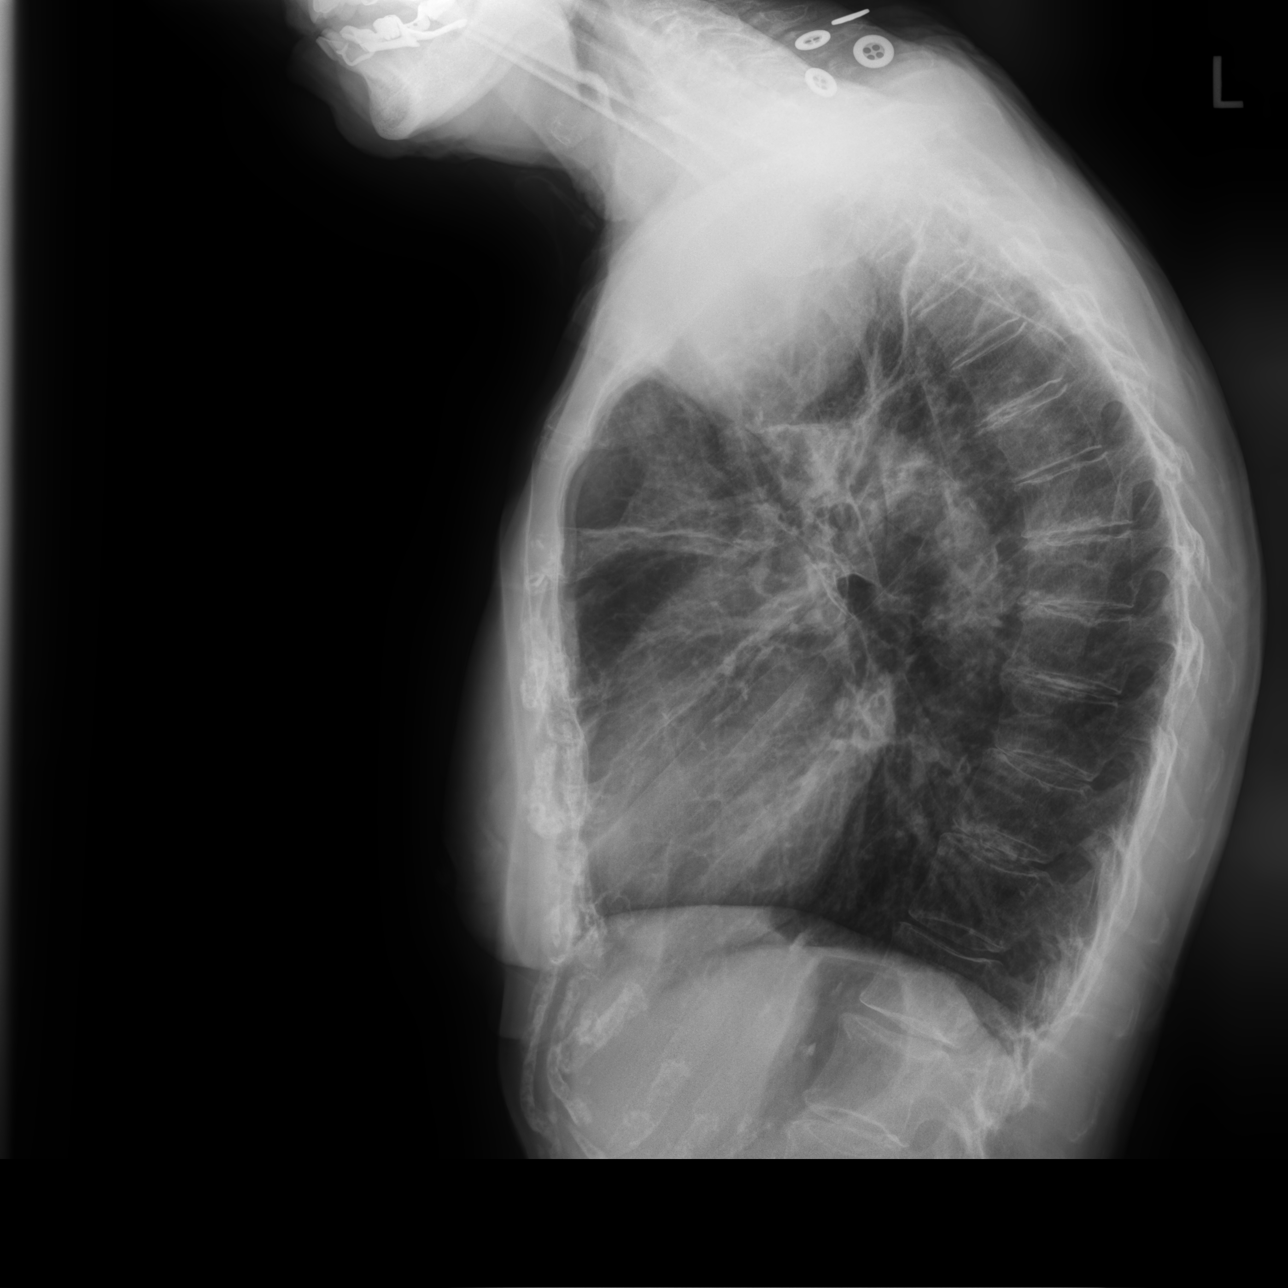

[3 of 3 positions shown; findings below may reference images not displayed]

FINDINGS: Cardiomediastinal silhouette unchanged in size and contour. Density
of the upper mediastinum with widening of the upper mediastinum.
Density projects on the lateral view as well as frontal view.

Coarsened interstitial markings throughout.

Stigmata of emphysema, with increased retrosternal airspace,
flattened hemidiaphragms, increased AP diameter, and hyperinflation
on the AP view.

No confluent airspace disease.  No pleural effusion or pneumothorax.
IMPRESSION: Density of the upper mediastinum. CT chest recommended for further
evaluation, to rule out malignancy/mediastinal mass.

Emphysema and chronic lung changes without evidence of pneumonia.

## 2019-02-03 NOTE — Progress Notes (Signed)
Subjective:    Patient ID: Linda [name], female    DOB: 30-Jun-1924, 85 y.o.   MRN: [id number]  HPI 85 year old former smoker (1-2 pack years) with a history of hypertension, hyperlipidemia, glaucoma, chronic rhinitis, chronic atrial tachycardia, GERD.  She is here today for chronic cough. She has some nasal congestion, but she feels saliva in her mouth that can sometimes choke her up. In retrospect this sounds more like mucous.  She coughs up yellowish mucous. She uses fluticasone NS daily. She is on pepcid also has zantac on her MAR, denies any overt GERD sx. She takes mucinex occasionally with some relief  PFT reviewed from 2014 [place]: FEV1 1.75 (92% predicted), possible mixed obstruction and restriction, but mild.   She had a CT chest 06/18/2018 in [place] and I have the report available.  There was some subtle biapical interstitial scar but otherwise no focal consolidations, masses, other parenchymal abnormalities.  No effusion or lymphadenopathy.  Overall reassuring study.    Review of Systems  Respiratory: Positive for cough.    Past Medical History:  Diagnosis Date  . Arthritis   . Glaucoma   . Hyperlipidemia   . Hypertension      Family History  Problem Relation Age of Onset  . Heart disease Mother   . Heart disease Father   . Heart disease Brother   . Heart disease Brother      Social History   Socioeconomic History  . Marital status: Widowed    Spouse name: Not on file  . Number of children: Not on file  . Years of education: Not on file  . Highest education level: Not on file  Occupational History  . Occupation: Retired  Engineer, productionocial Needs  . Financial resource strain: Not on file  . Food insecurity    Worry: Not on file    Inability: Not on file  . Transportation needs    Medical: Not on file    Non-medical: Not on file  Tobacco Use  . Smoking status: Former Smoker    Packs/day: 0.25    Years: 2.00    Pack years: 0.50    Types: Cigarettes    Start  date: 121950    Quit date: 1952    Years since quitting: 68.5  . Smokeless tobacco: Never Used  Substance and Sexual Activity  . Alcohol use: Not Currently    Frequency: Never  . Drug use: Never  . Sexual activity: Not on file  Lifestyle  . Physical activity    Days per week: Not on file    Minutes per session: Not on file  . Stress: Not on file  Relationships  . Social Musicianconnections    Talks on phone: Not on file    Gets together: Not on file    Attends religious service: Not on file    Active member of club or organization: Not on file    Attends meetings of clubs or organizations: Not on file    Relationship status: Not on file  . Intimate partner violence    Fear of current or ex partner: Not on file    Emotionally abused: Not on file    Physically abused: Not on file    Forced sexual activity: Not on file  Other Topics Concern  . Not on file  Social History Narrative  . Not on file     No Known Allergies   Outpatient Medications Prior to Visit  Medication Sig Dispense Refill  . acetaminophen (  TYLENOL) 325 MG tablet Take 650 mg by mouth every 6 (six) hours as needed.    Marland Kitchen aspirin EC 81 MG tablet Take 81 mg by mouth daily.    . calcium carbonate (OSCAL) 1500 (600 Ca) MG TABS tablet Take by mouth 2 (two) times daily with a meal.    . Cholecalciferol (VITAMIN D3) 25 MCG (1000 UT) CAPS Take 1 capsule by mouth daily.    . ciclopirox (LOPROX) 0.77 % cream Apply topically 2 (two) times daily.    . fluticasone (FLONASE) 50 MCG/ACT nasal spray     . Multiple Vitamin (MULTIVITAMIN) tablet Take 1 tablet by mouth daily.    . rosuvastatin (CRESTOR) 20 MG tablet Take 1 tablet (20 mg total) by mouth daily. 90 tablet 1  . sotalol (BETAPACE) 80 MG tablet Take 1 tablet (80 mg total) by mouth every 12 (twelve) hours. 180 tablet 3  . calcium-vitamin D (OSCAL WITH D) 500-200 MG-UNIT tablet Take 1 tablet by mouth.    . clobetasol cream (TEMOVATE) 0.25 % Apply 1 application topically as  needed.    . famotidine (PEPCID) 20 MG tablet Take 20 mg by mouth as needed for heartburn or indigestion.    Marland Kitchen FLUAD 0.5 ML SUSY     . ranitidine (ZANTAC) 150 MG tablet     . fluticasone (VERAMYST) 27.5 MCG/SPRAY nasal spray Place 2 sprays into the nose daily.    . Sotalol HCl AF (SOTALOL HYDROCHLORIDE) 80 MG TABS      No facility-administered medications prior to visit.           Objective:   Physical Exam Vitals:   02/03/19 1411  BP: (!) 160/72  Pulse: 65  Temp: 97.6 F (36.4 C)  TempSrc: Oral  SpO2: 98%  Weight: 117 lb 9.6 oz (53.3 kg)  Height: 5\' 4"  (1.626 m)   Gen: Pleasant, thin elderly woman, in no distress,  normal affect  ENT: No lesions,  mouth clear,  oropharynx clear, no postnasal drip  Neck: No JVD, no stridor  Lungs: No use of accessory muscles, no crackles or wheezing on normal respiration, no wheeze on forced expiration  Cardiovascular: RRR, heart sounds normal, no murmur or gallops, no peripheral edema  Musculoskeletal: No deformities, no cyanosis or clubbing  Neuro: alert, awake, non focal  Skin: Warm, no lesions or rash      Assessment & Plan:  Chronic cough Based on her description she appears to get mucus accumulated in the back of her throat.  Only occasional GERD symptoms.  Both might be contributing to her chronic longstanding cough.  She did not have any evidence for significant obstructive disease on her prior PFT from 2014.  I think will be reasonable to try more dedicated treatment of both rhinitis and GERD to see if we can decrease her cough frequency before we commit to repeating PFTs etc.  Chest x-ray today  Please continue your fluticasone nasal spray, 2 sprays each nostril once daily. Please start taking loratadine 10 mg (Claritin) once daily every day Please start taking your Pepcid (famotidine) 20 mg once a day every day until next visit Chest x-ray today to compare with your prior imaging from [place] Try using Mucinex as needed  for cough Follow with Dr Lamonte Sakai in 1 month to discuss how your cough is doing after these changes.  Baltazar Apo, MD, PhD 02/03/2019, 2:46 PM [facility] [phone] or if no answer [phone]

## 2019-02-03 NOTE — Patient Instructions (Signed)
Please continue your fluticasone nasal spray, 2 sprays each nostril once daily. Please start taking loratadine 10 mg (Claritin) once daily every day Please start taking your Pepcid (famotidine) 20 mg once a day every day until next visit Chest x-ray today to compare with your prior imaging from [place] Try using Mucinex as needed for cough Follow with Dr Lamonte Sakai in 1 month to discuss how your cough is doing after these changes.

## 2019-02-03 NOTE — Assessment & Plan Note (Signed)
Based on her description she appears to get mucus accumulated in the back of her throat.  Only occasional GERD symptoms.  Both might be contributing to her chronic longstanding cough.  She did not have any evidence for significant obstructive disease on her prior PFT from 2014.  I think will be reasonable to try more dedicated treatment of both rhinitis and GERD to see if we can decrease her cough frequency before we commit to repeating PFTs etc.  Chest x-ray today  Please continue your fluticasone nasal spray, 2 sprays each nostril once daily. Please start taking loratadine 10 mg (Claritin) once daily every day Please start taking your Pepcid (famotidine) 20 mg once a day every day until next visit Chest x-ray today to compare with your prior imaging from [place] Try using Mucinex as needed for cough Follow with Dr Lamonte Sakai in 1 month to discuss how your cough is doing after these changes.

## 2019-02-26 NOTE — Progress Notes (Signed)
Linda [name] is a 85 y.o. female here for a new problem.  I acted as a Education administrator for [employer], PA-C Anselmo Pickler, LPN  History of Present Illness:   Chief Complaint  Patient presents with  . Foot Pain    HPI    Foot pain Pt c/o pain right foot, overturned her foot 10 days ago. C/o pain top of right foot and on the side, the area was ecchymotic but has gone away. Right ankle slightly swollen on. Using Tylenol with relief. She has been keeping her food elevated. She denies: numbness/tingling, clicking/popping sensation, prior fracture in this ankle. She usually uses cane but is now using walker.    Past Medical History:  Diagnosis Date  . Arthritis   . Glaucoma   . Hyperlipidemia   . Hypertension      Social History   Socioeconomic History  . Marital status: Widowed    Spouse name: Not on file  . Number of children: Not on file  . Years of education: Not on file  . Highest education level: Not on file  Occupational History  . Occupation: Retired  Scientific laboratory technician  . Financial resource strain: Not on file  . Food insecurity    Worry: Not on file    Inability: Not on file  . Transportation needs    Medical: Not on file    Non-medical: Not on file  Tobacco Use  . Smoking status: Former Smoker    Packs/day: 0.25    Years: 2.00    Pack years: 0.50    Types: Cigarettes    Start date: 45    Quit date: 1952    Years since quitting: 68.5  . Smokeless tobacco: Never Used  Substance and Sexual Activity  . Alcohol use: Not Currently    Frequency: Never  . Drug use: Never  . Sexual activity: Not on file  Lifestyle  . Physical activity    Days per week: Not on file    Minutes per session: Not on file  . Stress: Not on file  Relationships  . Social Herbalist on phone: Not on file    Gets together: Not on file    Attends religious service: Not on file    Active member of club or organization: Not on file    Attends meetings of clubs or  organizations: Not on file    Relationship status: Not on file  . Intimate partner violence    Fear of current or ex partner: Not on file    Emotionally abused: Not on file    Physically abused: Not on file    Forced sexual activity: Not on file  Other Topics Concern  . Not on file  Social History Narrative  . Not on file    Past Surgical History:  Procedure Laterality Date  . APPENDECTOMY      Family History  Problem Relation Age of Onset  . Heart disease Mother   . Heart disease Father   . Heart disease Brother   . Heart disease Brother     No Known Allergies  Current Medications:   Current Outpatient Medications:  .  acetaminophen (TYLENOL) 325 MG tablet, Take 650 mg by mouth every 6 (six) hours as needed., Disp: , Rfl:  .  aspirin EC 81 MG tablet, Take 81 mg by mouth daily., Disp: , Rfl:  .  calcium carbonate (OSCAL) 1500 (600 Ca) MG TABS tablet, Take by mouth 2 (two) times  daily with a meal., Disp: , Rfl:  .  Cholecalciferol (VITAMIN D3) 25 MCG (1000 UT) CAPS, Take 1 capsule by mouth daily., Disp: , Rfl:  .  ciclopirox (LOPROX) 0.77 % cream, Apply topically 2 (two) times daily., Disp: , Rfl:  .  clobetasol cream (TEMOVATE) 0.05 %, Apply 1 application topically as needed., Disp: , Rfl:  .  famotidine (PEPCID) 20 MG tablet, Take 20 mg by mouth as needed for heartburn or indigestion., Disp: , Rfl:  .  FLUAD 0.5 ML SUSY, , Disp: , Rfl:  .  fluticasone (FLONASE) 50 MCG/ACT nasal spray, , Disp: , Rfl:  .  Multiple Vitamin (MULTIVITAMIN) tablet, Take 1 tablet by mouth daily., Disp: , Rfl:  .  rosuvastatin (CRESTOR) 20 MG tablet, Take 1 tablet (20 mg total) by mouth daily., Disp: 90 tablet, Rfl: 1 .  sotalol (BETAPACE) 80 MG tablet, Take 1 tablet (80 mg total) by mouth every 12 (twelve) hours., Disp: 180 tablet, Rfl: 3 .  ranitidine (ZANTAC) 150 MG tablet, , Disp: , Rfl:  .  timolol (TIMOPTIC) 0.25 % ophthalmic solution, , Disp: , Rfl:    Review of Systems:    ROS Negative unless otherwise specified per HPI.  Vitals:   Vitals:   02/27/19 0851  BP: 130/70  Pulse: 67  Temp: 98.2 F (36.8 C)  TempSrc: Oral  SpO2: 97%  Weight: 118 lb 6.1 oz (53.7 kg)  Height: 5\' 4"  (1.626 m)     Body mass index is 20.32 kg/m.  Physical Exam:   Physical Exam Constitutional:      Appearance: She is well-developed.  HENT:     Head: Normocephalic and atraumatic.  Eyes:     Conjunctiva/sclera: Conjunctivae normal.  Neck:     Musculoskeletal: Normal range of motion and neck supple.  Pulmonary:     Effort: Pulmonary effort is normal.  Musculoskeletal: Normal range of motion.     Comments: Foot & Ankle: Overall foot and ankle are well aligned. Significant TTP over the anterior aspect of the lateral malleolus. No TTP over base of the 5th metatarsal, navicular, or posterior aspect of medial or lateral malleolus. No significant pain or discomfort with isolated passive forefoot abduction. Inversion, eversion, dorsiflexion and plantar flexion strength 5+/5. Stable to ankle drawer.   Skin:    General: Skin is warm and dry.  Neurological:     Mental Status: She is alert and oriented to person, place, and time.  Psychiatric:        Behavior: Behavior normal.        Thought Content: Thought content normal.        Judgment: Judgment normal.      Assessment and Plan:   Linda [name] was seen today for foot pain.  Diagnoses and all orders for this visit:  Acute right ankle pain -     DG Ankle Complete Right; Future   Xray pending. Suspect sprain. Did wrap foot with ACE wrap in office. Continue to elevate and trial ice. Very low threshold to send to sports medicine if she has any issues with worsening pain, lack of improvement, or fracture.  . Reviewed expectations re: course of current medical issues. . Discussed self-management of symptoms. . Outlined signs and symptoms indicating need for more acute intervention. . Patient verbalized  understanding and all questions were answered. . See orders for this visit as documented in the electronic medical record. . Patient received an After-Visit Summary.  CMA or LPN served as Neurosurgeonscribe during this  visit. History, Physical, and Plan performed by medical provider. The above documentation has been reviewed and is accurate and complete.   Jarold MottoSamantha Barney Russomanno, PA-C

## 2019-02-27 ENCOUNTER — Ambulatory Visit (INDEPENDENT_AMBULATORY_CARE_PROVIDER_SITE_OTHER): Payer: Medicare Other | Admitting: Physician Assistant

## 2019-02-27 ENCOUNTER — Other Ambulatory Visit: Payer: Self-pay

## 2019-02-27 ENCOUNTER — Ambulatory Visit (INDEPENDENT_AMBULATORY_CARE_PROVIDER_SITE_OTHER): Payer: Medicare Other

## 2019-02-27 ENCOUNTER — Encounter: Payer: Self-pay | Admitting: Physician Assistant

## 2019-02-27 ENCOUNTER — Telehealth: Payer: Self-pay

## 2019-02-27 VITALS — BP 130/70 | HR 67 | Temp 98.2°F | Ht 64.0 in | Wt 118.4 lb

## 2019-02-27 DIAGNOSIS — M25571 Pain in right ankle and joints of right foot: Secondary | ICD-10-CM

## 2019-02-27 IMAGING — DX RIGHT ANKLE - COMPLETE 3+ VIEW
3 series · 3 of 3 positions shown · non-contrast
Comparison: None.

CLINICAL DATA: [AGE] presenting with persistent RIGHT ankle
pain after an inversion injury approximately 10 days ago. Initial
encounter.

EXAM:
RIGHT ANKLE - COMPLETE 3+ VIEW

[ankle ap]
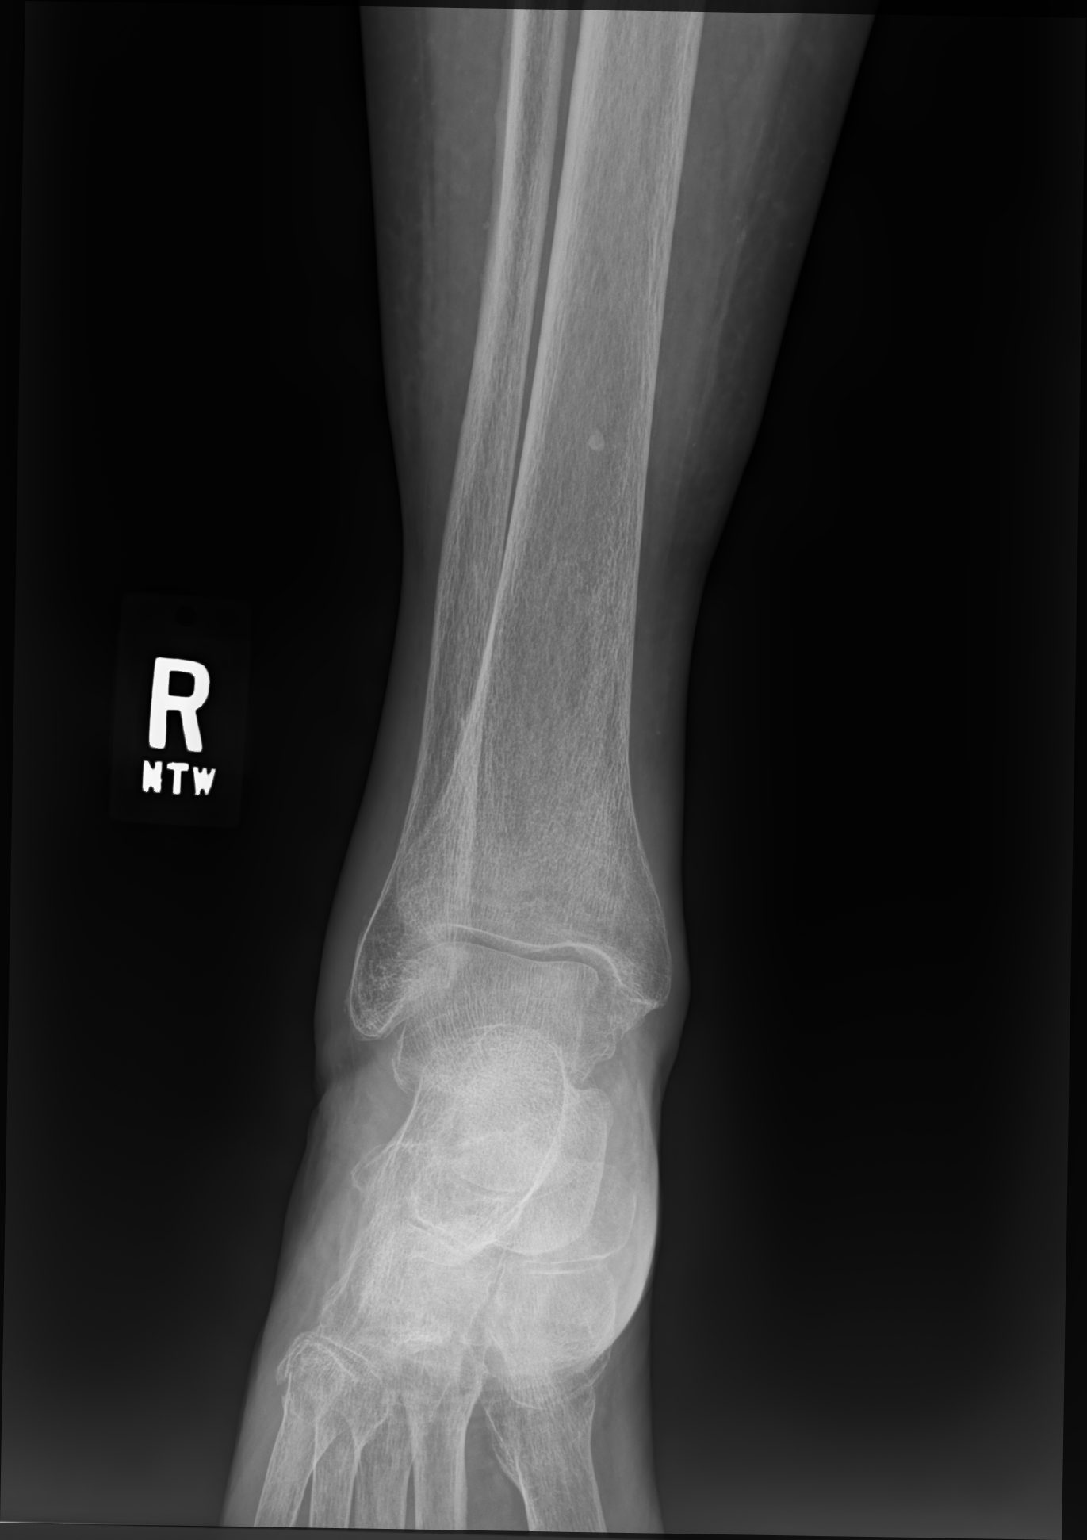

[ankle oblique]
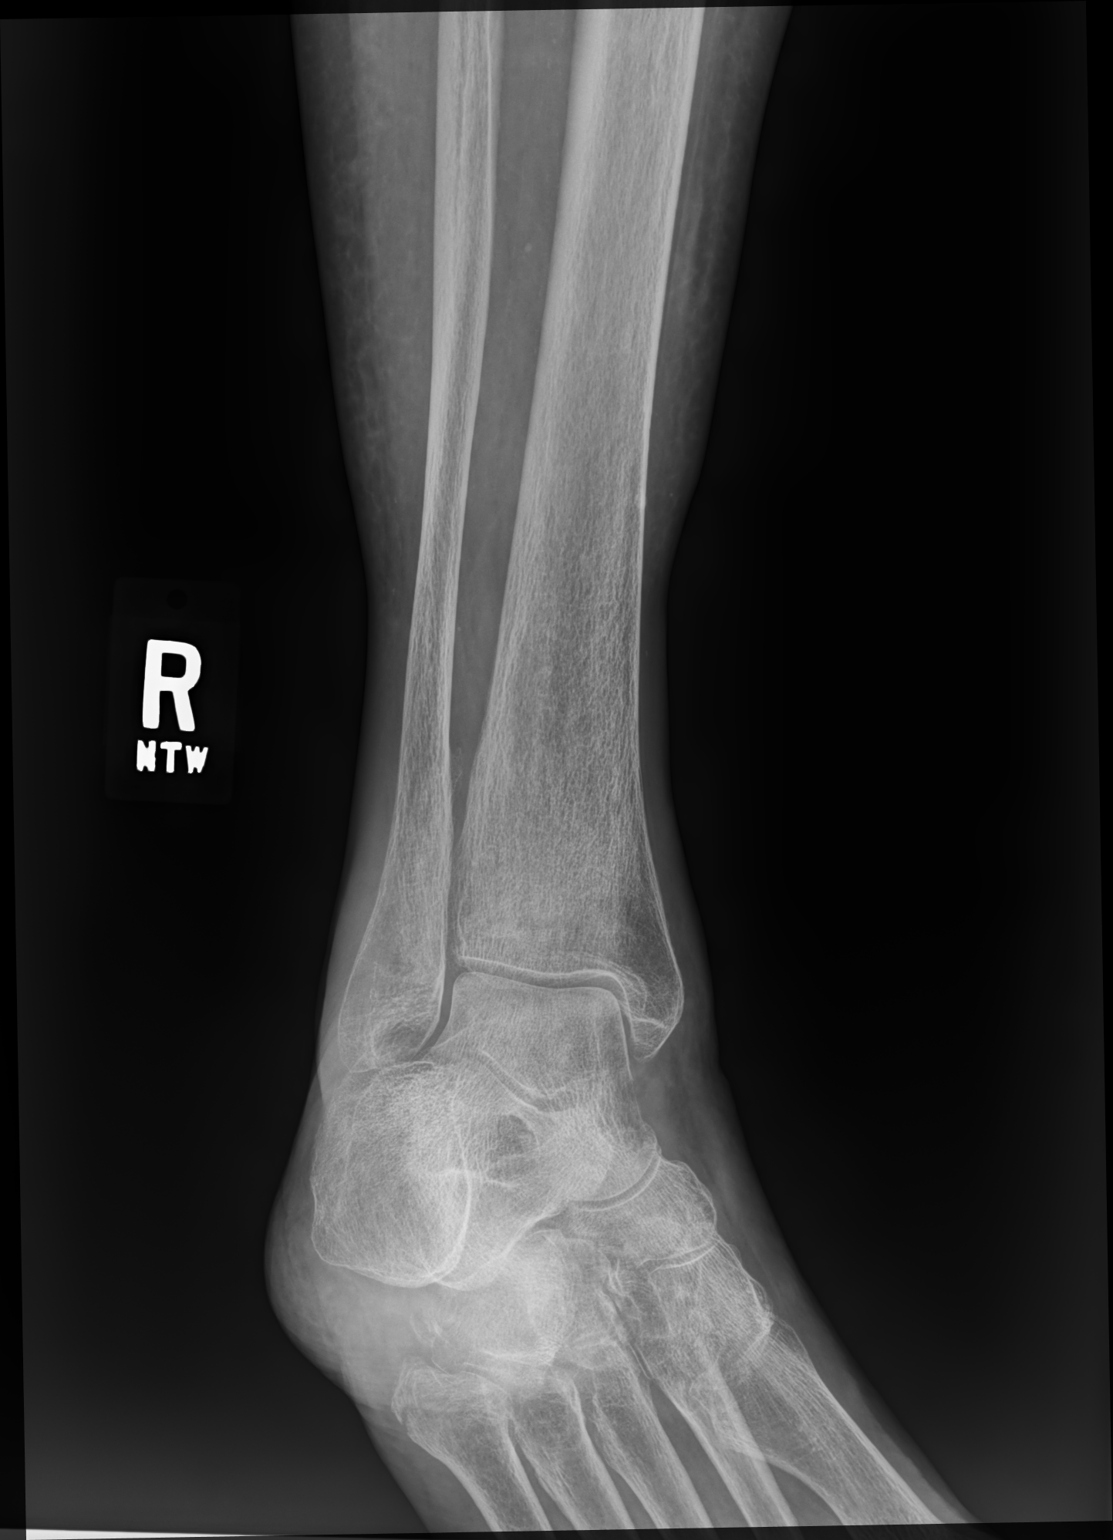

[ankle lat]
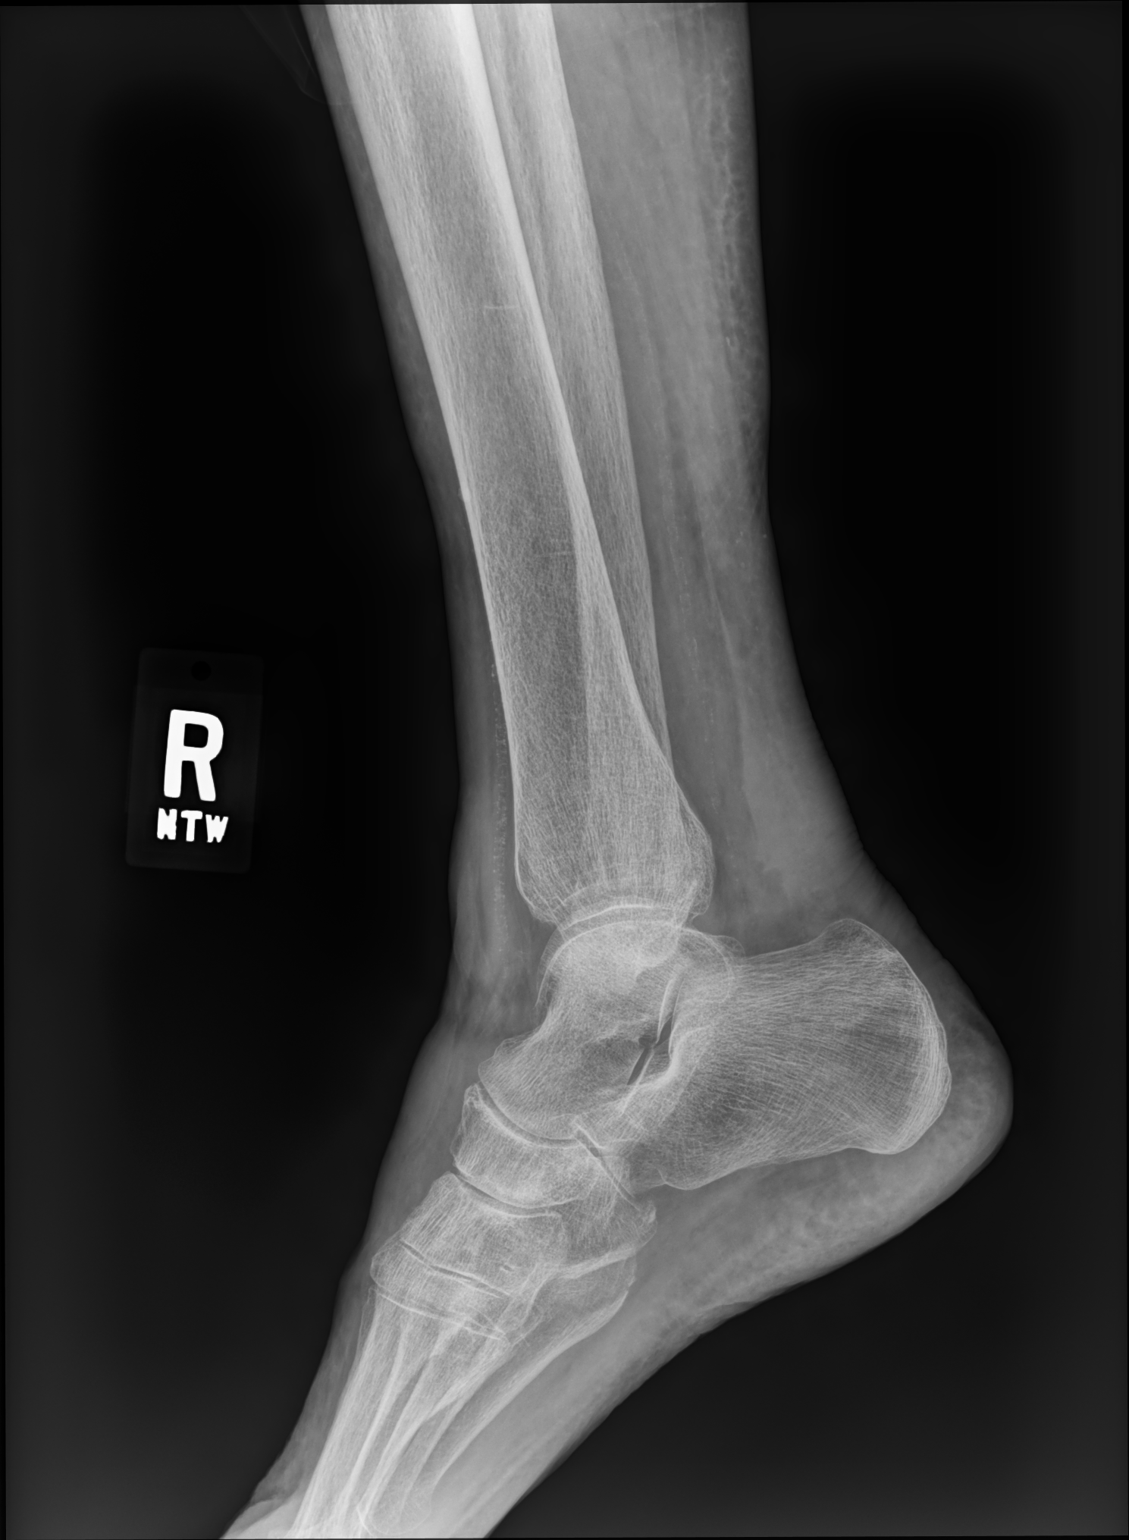

[3 of 3 positions shown; findings below may reference images not displayed]

FINDINGS: No evidence of acute or subacute fracture or dislocation involving
the ankle. Acute/subacute fracture involving the base of the fifth
metatarsal. Osseous demineralization. Tibiotalar joint anatomically
aligned with mild joint space narrowing.
IMPRESSION: 1. Acute/subacute fracture involving the base of the fifth
metatarsal.
2. No fractures elsewhere.
3. Mild osteoarthritis.
4. Osseous demineralization.

These results will be called to the ordering clinician or
representative by the Radiologist Assistant, and communication
documented in the PACS or zVision Dashboard.

## 2019-02-27 NOTE — Patient Instructions (Signed)
It was great to see you!  I will be in touch as soon as the xray has resulted.  If there is concern for fracture OR if the pain doesn't improve in a week OR if the pain gets worse, we are going to send you to a sports medicine provider.  Take care,  Inda Coke PA-C

## 2019-02-27 NOTE — Telephone Encounter (Signed)
Received call from [facility] patient has break at the base of fifth Metatarsal. Per Sam call patient and tell her to keep as no wight bearing  as possible and start stat referral to Dr. Arlice Colt  at [facility]

## 2019-02-28 ENCOUNTER — Encounter: Payer: Self-pay | Admitting: Family Medicine

## 2019-02-28 ENCOUNTER — Ambulatory Visit (INDEPENDENT_AMBULATORY_CARE_PROVIDER_SITE_OTHER): Payer: Medicare Other | Admitting: Family Medicine

## 2019-02-28 DIAGNOSIS — G8929 Other chronic pain: Secondary | ICD-10-CM | POA: Diagnosis not present

## 2019-02-28 DIAGNOSIS — M545 Low back pain, unspecified: Secondary | ICD-10-CM

## 2019-02-28 DIAGNOSIS — S92354A Nondisplaced fracture of fifth metatarsal bone, right foot, initial encounter for closed fracture: Secondary | ICD-10-CM | POA: Diagnosis not present

## 2019-02-28 DIAGNOSIS — M25562 Pain in left knee: Secondary | ICD-10-CM | POA: Diagnosis not present

## 2019-02-28 NOTE — Patient Instructions (Signed)
   Vitamin D3:  Take 5,000 IU daily  Vitamin K2:  100 mcg daily  Magnesium:  200-400 mg daily    

## 2019-02-28 NOTE — Progress Notes (Signed)
Office Visit Note   Patient: Linda [name]           Date of Birth: February 08, 1924           MRN: [id number] Visit Date: 02/28/2019 Requested by: Helane RimaWallace, Erica, DO [address] [place] [id number] PCP: Helane RimaWallace, Erica, DO  Subjective: Chief Complaint  Patient presents with  . Right Foot - Pain, Injury    Rolled foot over on 7/05 or 7/06. Pain on top and lateral side of foot since then. Ambulating with rolling walker today. Uses cane in the house.    HPI: She is here with right foot fracture.  About 10 days ago she stood up and inverted her foot and ankle.  She managed to keep from falling but had immediate pain on the side of her foot.  It was not getting better so she went to her PCP yesterday where x-rays revealed a nondisplaced proximal fifth metatarsal fracture.  She is using a walker for ordinarily she uses a x-ray and right foot before.  [place] from [place]  She states that she was having lots of problems with her low back and had x-rays, MRI scans, and multiple injections with no relief.  She has a 2 evaluate her low back once her foot problem subsides.  She plans to obtain her x-rays and MRI scans for my review.  She also has a history of left knee meniscus tear which improved after steroid injections about 15 years ago.  Is starting to bother her again and intermittently gives way.  She wonders whether another injection might help.                ROS: No fevers or chills.  All other systems were reviewed and are negative.  Objective: Vital Signs: There were no vitals taken for this visit.  Physical Exam:  General:  Alert and oriented, in no acute distress. Pulm:  Breathing unlabored. Psy:  Normal mood, congruent affect. Skin: No rash on her skin. Right foot: No tenderness proximal fibula, negative syndesmosis squeeze.  Minimal tenderness over the lateral malleolus.  No tenderness over the medial malleolus or the navicular.  Maximum tenderness at  the proximal fifth metatarsal.  Pain with eversion against resistance but tendon function intact.  Imaging: None today but x-rays from yesterday were reviewed showing a nondisplaced proximal fifth metatarsal avulsion type fracture.  Diffuse osteopenia.  Assessment & Plan: 1.  Approximately 10 days status post right foot and ankle sprain with nondisplaced proximal fifth metatarsal fracture. -Okay to keep wearing regular shoe, I do not think this is at risk for displacing since she has already been walking on it and x-rays showed good alignment. -She will go back to her PCP office and have x-rays in 2 weeks and I will call her with my interpretation.  Anticipate about 6 to 12 weeks total healing time. -We will treat with higher dose vitamin D3, vitamin K2 and magnesium to improve bone strength.  2.  Chronic low back pain - I will reevaluate her once we have her old records for review from [place].  3.  Chronic left knee pain with history of meniscus tear -Once her fracture heals we will consider cortisone injections again.      Procedures: No procedures performed  No notes on file     PMFS History: Patient Active Problem List   Diagnosis Date Noted  . Chronic cough 02/03/2019  . Thoracic back pain 10/13/2018  .  Other fatigue 10/13/2018  . Hyperlipidemia 10/13/2018  . Vitamin D deficiency 10/13/2018  . Iron deficiency anemia 10/13/2018  . Gastroesophageal reflux disease without esophagitis 10/13/2018  . Chronic midline thoracic back pain 10/13/2018  . Osteoporosis without current pathological fracture 10/13/2018  . Essential hypertension 10/13/2018  . Stenosis of carotid artery 10/13/2018  . History of thyroid nodule, s/p bx, benign 10/13/2018  . Living in assisted living 10/13/2018  . Aortic cusp regurgitation 10/13/2018  . Nonrheumatic tricuspid valve regurgitation 10/13/2018  . Coccyalgia 10/13/2018  . Insulin resistance 10/13/2018  . Gait instability 10/13/2018  .  Assistance needed with transportation, no longer drives due to vision 10/13/2018  . Irritable bowel syndrome 10/13/2018  . Bilateral hearing loss 10/13/2018  . Dysthymia 10/13/2018  . Chronic rhinitis 10/13/2018  . Paroxysmal atrial tachycardia ([place]) 10/13/2018   Past Medical History:  Diagnosis Date  . Arthritis   . Glaucoma   . Hyperlipidemia   . Hypertension     Family History  Problem Relation Age of Onset  . Heart disease Mother   . Heart disease Father   . Heart disease Brother   . Heart disease Brother     Past Surgical History:  Procedure Laterality Date  . APPENDECTOMY     Social History   Occupational History  . Occupation: Retired  Tobacco Use  . Smoking status: Former Smoker    Packs/day: 0.25    Years: 2.00    Pack years: 0.50    Types: Cigarettes    Start date: 40    Quit date: 1952    Years since quitting: 68.5  . Smokeless tobacco: Never Used  Substance and Sexual Activity  . Alcohol use: Not Currently    Frequency: Never  . Drug use: Never  . Sexual activity: Not on file

## 2019-03-04 ENCOUNTER — Telehealth: Payer: Self-pay | Admitting: Family Medicine

## 2019-03-04 NOTE — Telephone Encounter (Signed)
Relation to pt:  Self  Call back number: [phone]   Reason for call:  Patient orthopedic placed MRI order at [facility] patient would like to schedule an appointment preferable on 7/30 or 7/31 around  11am. Patient also seeking a follow up with PCP for the same day due to transportation being limited. Attempted to reach practice and was unsuccessful, please advise

## 2019-03-04 NOTE — Telephone Encounter (Signed)
Patient is scheduled for 7/31 at 11 am

## 2019-03-14 ENCOUNTER — Ambulatory Visit (INDEPENDENT_AMBULATORY_CARE_PROVIDER_SITE_OTHER): Payer: Medicare Other

## 2019-03-14 ENCOUNTER — Encounter: Payer: Self-pay | Admitting: Family Medicine

## 2019-03-14 ENCOUNTER — Ambulatory Visit (INDEPENDENT_AMBULATORY_CARE_PROVIDER_SITE_OTHER): Payer: Medicare Other | Admitting: Family Medicine

## 2019-03-14 ENCOUNTER — Telehealth: Payer: Self-pay | Admitting: Family Medicine

## 2019-03-14 VITALS — BP 146/80 | HR 62 | Temp 98.1°F | Ht 64.0 in | Wt 118.4 lb

## 2019-03-14 DIAGNOSIS — I6529 Occlusion and stenosis of unspecified carotid artery: Secondary | ICD-10-CM

## 2019-03-14 DIAGNOSIS — I10 Essential (primary) hypertension: Secondary | ICD-10-CM

## 2019-03-14 DIAGNOSIS — S92901D Unspecified fracture of right foot, subsequent encounter for fracture with routine healing: Secondary | ICD-10-CM | POA: Diagnosis not present

## 2019-03-14 DIAGNOSIS — R2689 Other abnormalities of gait and mobility: Secondary | ICD-10-CM

## 2019-03-14 DIAGNOSIS — E785 Hyperlipidemia, unspecified: Secondary | ICD-10-CM

## 2019-03-14 DIAGNOSIS — D509 Iron deficiency anemia, unspecified: Secondary | ICD-10-CM | POA: Diagnosis not present

## 2019-03-14 LAB — COMPREHENSIVE METABOLIC PANEL
ALT: 17 U/L (ref 0–35)
AST: 26 U/L (ref 0–37)
Albumin: 4.3 g/dL (ref 3.5–5.2)
Alkaline Phosphatase: 100 U/L (ref 39–117)
BUN: 15 mg/dL (ref 6–23)
CO2: 29 mEq/L (ref 19–32)
Calcium: 10.8 mg/dL — ABNORMAL HIGH (ref 8.4–10.5)
Chloride: 103 mEq/L (ref 96–112)
Creatinine, Ser: 0.67 mg/dL (ref 0.40–1.20)
GFR: 82.14 mL/min (ref >60.00)
Glucose, Bld: 81 mg/dL (ref 70–99)
Potassium: 5 mEq/L (ref 3.5–5.1)
Sodium: 139 mEq/L (ref 135–145)
Total Bilirubin: 0.4 mg/dL (ref 0.2–1.2)
Total Protein: 6.9 g/dL (ref 6.0–8.3)

## 2019-03-14 LAB — LIPID PANEL
Cholesterol: 148 mg/dL (ref 0–200)
HDL: 60.9 mg/dL (ref >39.00)
LDL Cholesterol: 60 mg/dL (ref 0–99)
NonHDL: 86.92
Total CHOL/HDL Ratio: 2
Triglycerides: 135 mg/dL (ref 0.0–149.0)
VLDL: 27 mg/dL (ref 0.0–40.0)

## 2019-03-14 LAB — CBC WITH DIFFERENTIAL/PLATELET
Basophils Absolute: 0 10*3/uL (ref 0.0–0.1)
Basophils Relative: 0.5 % (ref 0.0–3.0)
Eosinophils Absolute: 0.2 10*3/uL (ref 0.0–0.7)
Eosinophils Relative: 2.9 % (ref 0.0–5.0)
HCT: 39.8 % (ref 36.0–46.0)
Hemoglobin: 13.2 g/dL (ref 12.0–15.0)
Lymphocytes Relative: 21.6 % (ref 12.0–46.0)
Lymphs Abs: 1.6 10*3/uL (ref 0.7–4.0)
MCHC: 33.2 g/dL (ref 30.0–36.0)
MCV: 95.7 fl (ref 78.0–100.0)
Monocytes Absolute: 0.5 10*3/uL (ref 0.1–1.0)
Monocytes Relative: 6.8 % (ref 3.0–12.0)
Neutro Abs: 5.2 10*3/uL (ref 1.4–7.7)
Neutrophils Relative %: 68.2 % (ref 43.0–77.0)
Platelets: 224 10*3/uL (ref 150.0–400.0)
RBC: 4.16 Mil/uL (ref 3.87–5.11)
RDW: 13.6 % (ref 11.5–15.5)
WBC: 7.6 10*3/uL (ref 4.0–10.5)

## 2019-03-14 IMAGING — DX RIGHT FOOT COMPLETE - 3+ VIEW
3 series · 3 of 3 positions shown · non-contrast
Comparison: Plain films of the right ankle [DATE].

CLINICAL DATA: The patient suffered a fracture of the base of the
fifth metatarsal due to a twisting injury in early [DATE].
Subsequent encounter.

EXAM:
RIGHT FOOT COMPLETE - 3+ VIEW

[foot dp]
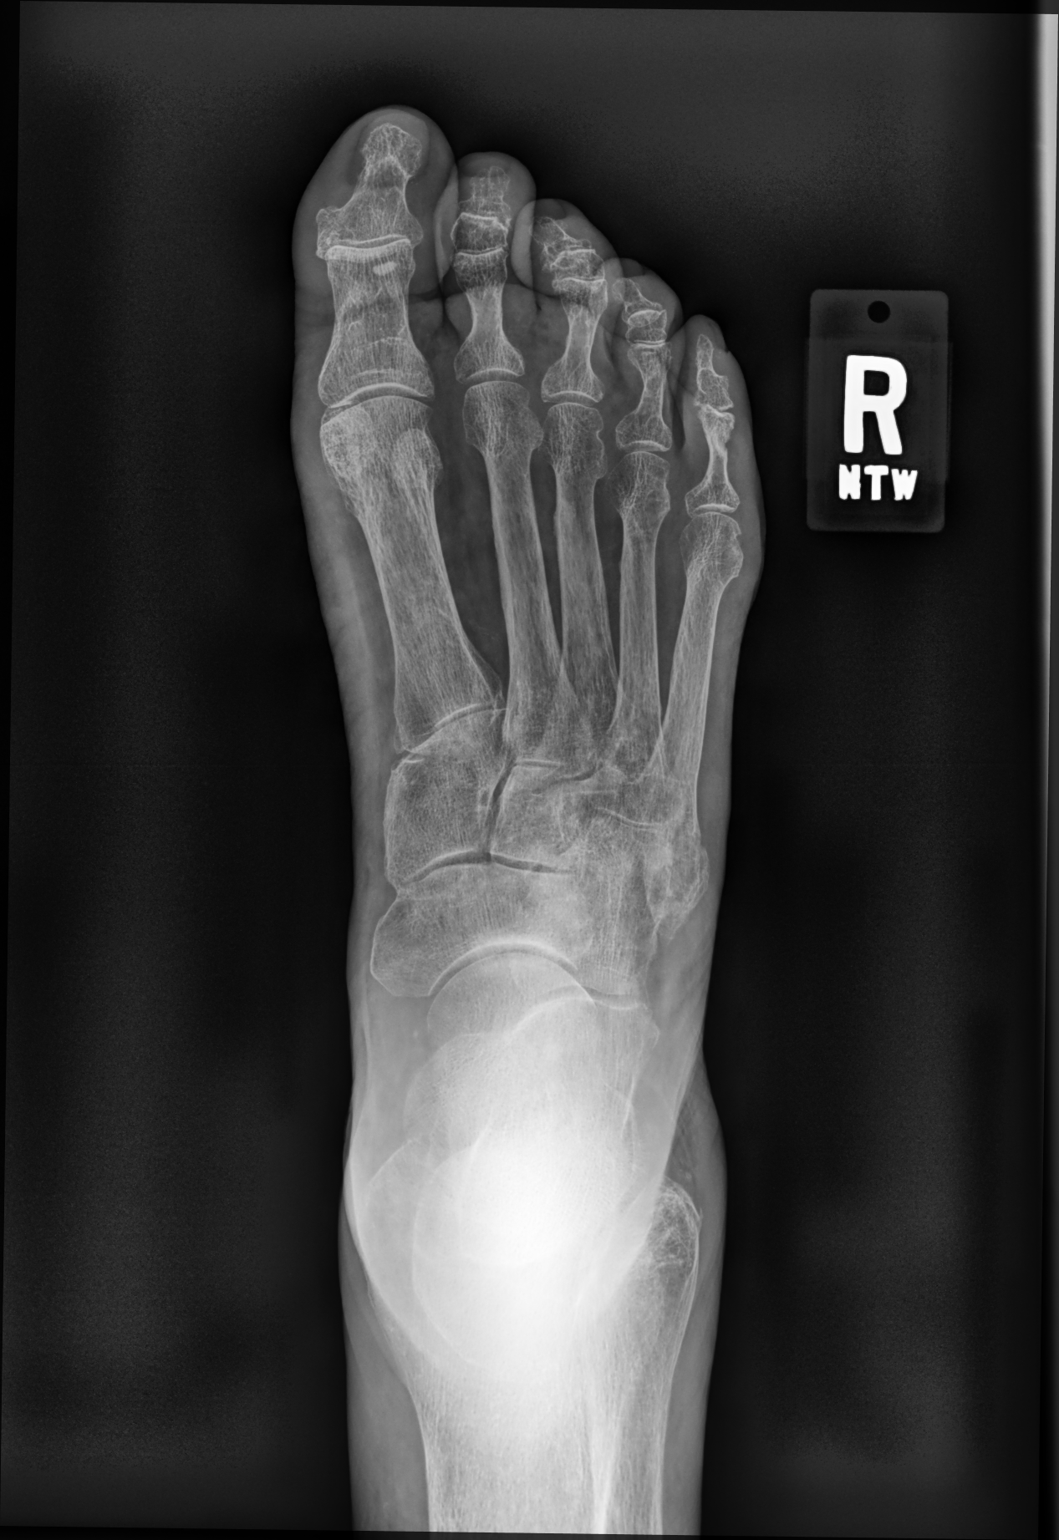

[foot oblique]
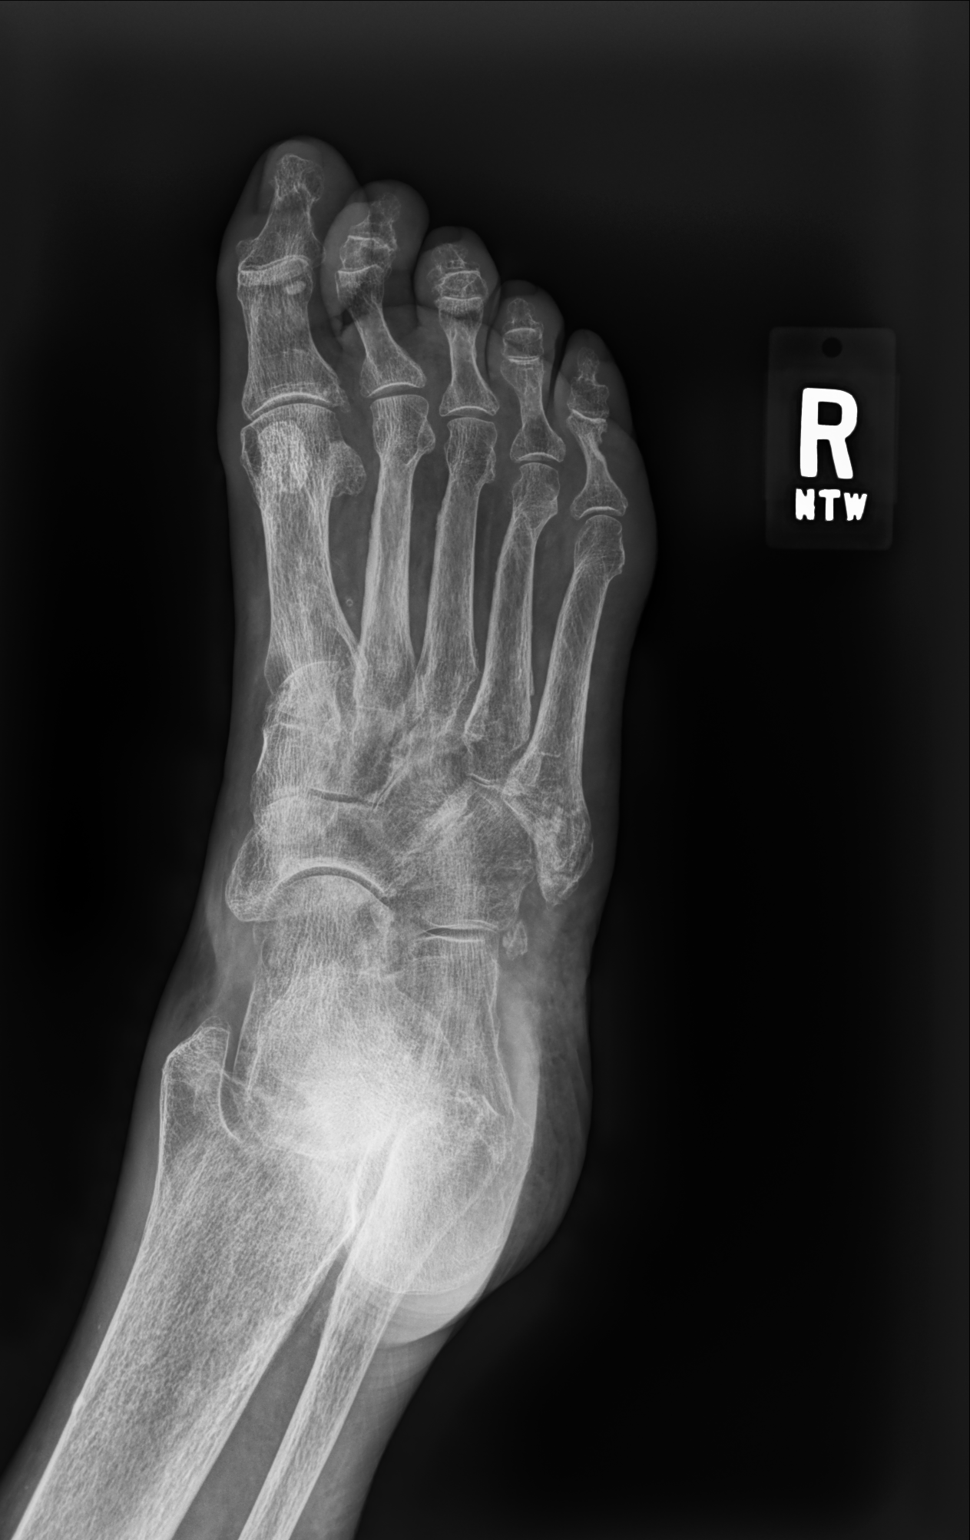

[foot lat]
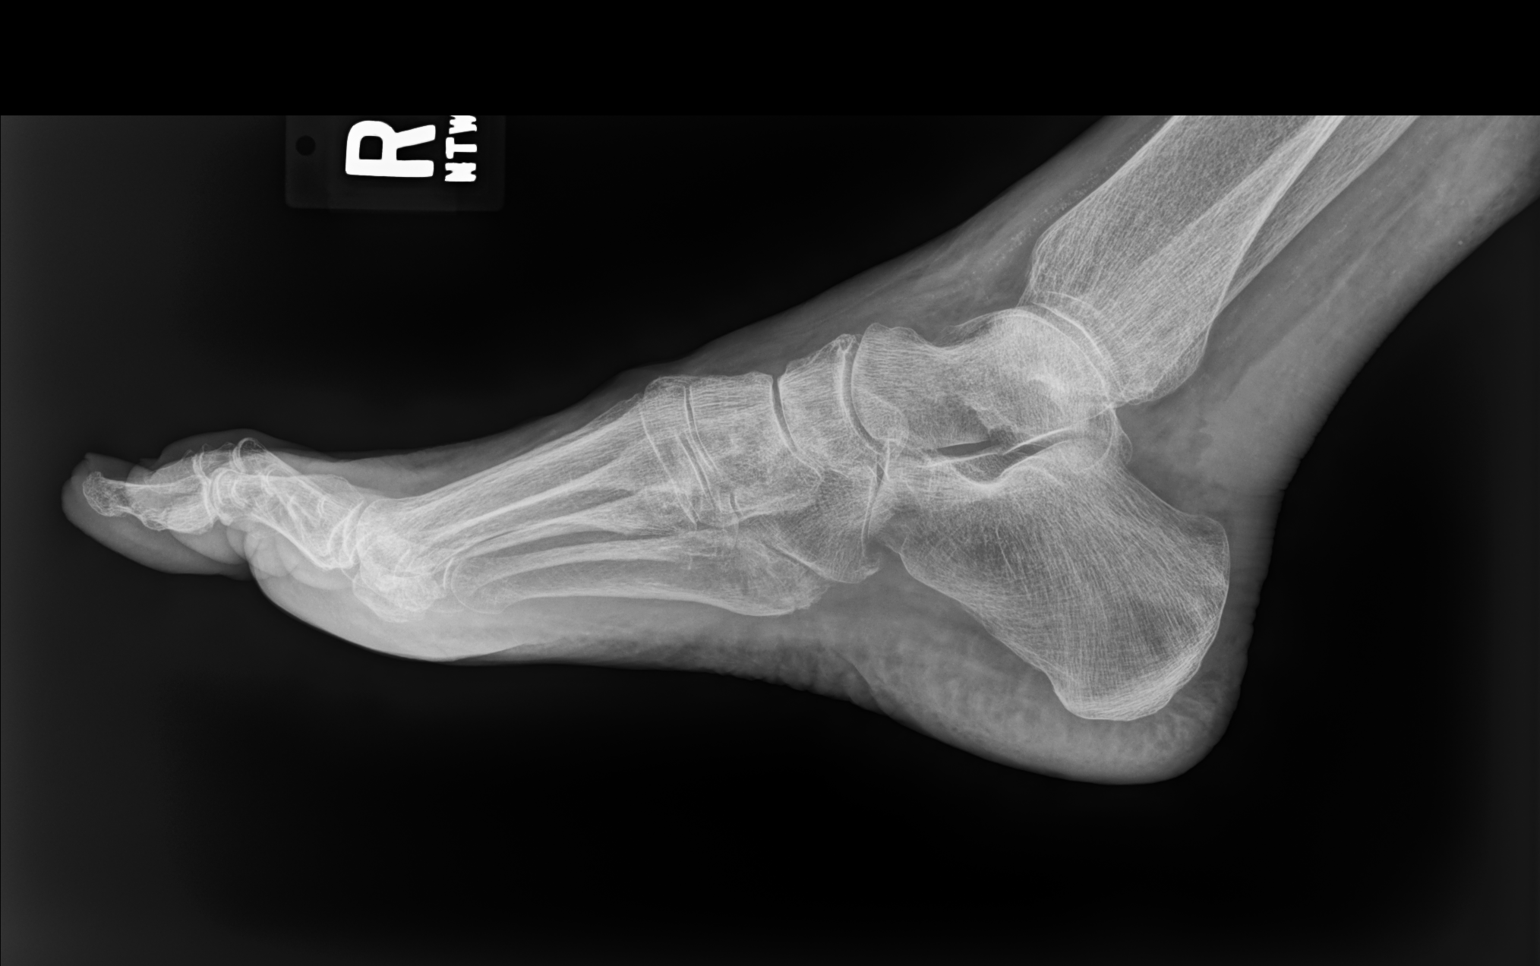

[3 of 3 positions shown; findings below may reference images not displayed]

FINDINGS: Nondisplaced fracture of the base of the fifth metatarsal is again
seen. Fracture margins are blurring consistent with healing. No new
abnormality.
IMPRESSION: Healing fracture base of the fifth metatarsal. No acute abnormality.

## 2019-03-14 NOTE — Telephone Encounter (Signed)
Pt called and stated that she would like a shower chair RX sent to her. Please advise

## 2019-03-14 NOTE — Patient Instructions (Signed)
Health Maintenance Due  Topic Date Due  . TETANUS/TDAP  03/11/1945  . DEXA SCAN  03/12/1991  . PNA vac Low Risk Adult (1 of 2 - PCV13) 03/12/1991  Will discuss during visit.  No flowsheet data found.

## 2019-03-14 NOTE — Telephone Encounter (Signed)
Rx sent 

## 2019-03-14 NOTE — Progress Notes (Signed)
Marceline Torbeck is a 85 y.o. female is here for follow up.  History of Present Illness:   HPI: Patient following up today for foot x-ray.  Fifth metatarsal fracture, evaluated here and with sports medicine.  Feels that it is healing.  She has been staying off her feet.  No new symptoms.  Patient endorses numbness in her legs bilaterally, worse in her thighs.  History of low back and sacroiliac pain.  No falls from this.  The patient says that she will be following up with sports medicine doctor once images are received.  Considering injections for left knee weakness.  Patient would like referral to ophthalmology and cardiology.  She did see either specialties in [place] before moving.  Patient would like a shower chair for safety reasons.  Health Maintenance Due  Topic Date Due  . TETANUS/TDAP  03/11/1945  . DEXA SCAN  03/12/1991  . PNA vac Low Risk Adult (1 of 2 - PCV13) 03/12/1991   No flowsheet data found. PMHx, SurgHx, SocialHx, FamHx, Medications, and Allergies were reviewed in the Visit Navigator and updated as appropriate.   Patient Active Problem List   Diagnosis Date Noted  . Chronic cough 02/03/2019  . Thoracic back pain 10/13/2018  . Other fatigue 10/13/2018  . Hyperlipidemia 10/13/2018  . Vitamin D deficiency 10/13/2018  . Iron deficiency anemia 10/13/2018  . Gastroesophageal reflux disease without esophagitis 10/13/2018  . Chronic midline thoracic back pain 10/13/2018  . Osteoporosis without current pathological fracture 10/13/2018  . Essential hypertension 10/13/2018  . Stenosis of carotid artery 10/13/2018  . History of thyroid nodule, s/p bx, benign 10/13/2018  . Living in assisted living 10/13/2018  . Aortic cusp regurgitation 10/13/2018  . Nonrheumatic tricuspid valve regurgitation 10/13/2018  . Coccyalgia 10/13/2018  . Insulin resistance 10/13/2018  . Gait instability 10/13/2018  . Assistance needed with transportation, no longer drives due to vision  10/13/2018  . Irritable bowel syndrome 10/13/2018  . Bilateral hearing loss 10/13/2018  . Dysthymia 10/13/2018  . Chronic rhinitis 10/13/2018  . Paroxysmal atrial tachycardia ([place]) 10/13/2018   Social History   Tobacco Use  . Smoking status: Former Smoker    Packs/day: 0.25    Years: 2.00    Pack years: 0.50    Types: Cigarettes    Start date: 37    Quit date: 1952    Years since quitting: 68.6  . Smokeless tobacco: Never Used  Substance Use Topics  . Alcohol use: Not Currently    Frequency: Never  . Drug use: Never   Current Medications and Allergies   .  acetaminophen (TYLENOL) 325 MG tablet, Take 650 mg by mouth every 6 (six) hours as needed., Disp: , Rfl:  .  aspirin EC 81 MG tablet, Take 81 mg by mouth daily., Disp: , Rfl:  .  calcium carbonate (OSCAL) 1500 (600 Ca) MG TABS tablet, Take by mouth 2 (two) times daily with a meal., Disp: , Rfl:  .  Cholecalciferol (VITAMIN D3) 25 MCG (1000 UT) CAPS, Take 1 capsule by mouth daily., Disp: , Rfl:  .  ciclopirox (LOPROX) 0.77 % cream, Apply topically 2 (two) times daily., Disp: , Rfl:  .  clobetasol cream (TEMOVATE) 2.59 %, Apply 1 application topically as needed., Disp: , Rfl:  .  famotidine (PEPCID) 20 MG tablet, Take 20 mg by mouth as needed for heartburn or indigestion., Disp: , Rfl:  .  FLUAD 0.5 ML SUSY, , Disp: , Rfl:  .  fluticasone (FLONASE) 50 MCG/ACT  nasal spray, , Disp: , Rfl:  .  Multiple Vitamin (MULTIVITAMIN) tablet, Take 1 tablet by mouth daily., Disp: , Rfl:  .  ranitidine (ZANTAC) 150 MG tablet, , Disp: , Rfl:  .  rosuvastatin (CRESTOR) 20 MG tablet, Take 1 tablet (20 mg total) by mouth daily., Disp: 90 tablet, Rfl: 1 .  sotalol (BETAPACE) 80 MG tablet, Take 1 tablet (80 mg total) by mouth every 12 (twelve) hours., Disp: 180 tablet, Rfl: 3 .  timolol (TIMOPTIC) 0.25 % ophthalmic solution, , Disp: , Rfl:   No Known Allergies   Review of Systems   Pertinent items are noted in the HPI. Otherwise, a complete  ROS is negative.  Vitals   Vitals:   03/14/19 1057  BP: (!) 146/80  Pulse: 62  Temp: 98.1 F (36.7 C)  TempSrc: Temporal  SpO2: 96%  Weight: 118 lb 6.1 oz (53.7 kg)  Height: 5\' 4"  (1.626 m)     Body mass index is 20.32 kg/m.  Physical Exam   Physical Exam Vitals signs and nursing note reviewed.  Constitutional:      General: She is not in acute distress.    Appearance: Normal appearance.  HENT:     Head: Normocephalic and atraumatic.     Right Ear: Tympanic membrane normal.     Left Ear: Tympanic membrane normal.     Nose: Nose normal.     Mouth/Throat:     Mouth: Mucous membranes are moist.  Eyes:     Pupils: Pupils are equal, round, and reactive to light.  Neck:     Musculoskeletal: Normal range of motion and neck supple.  Cardiovascular:     Rate and Rhythm: Normal rate and regular rhythm.  Pulmonary:     Effort: Pulmonary effort is normal.  Abdominal:     Palpations: Abdomen is soft.  Skin:    General: Skin is warm.     Capillary Refill: Capillary refill takes less than 2 seconds.  Neurological:     General: No focal deficit present.     Mental Status: She is alert.  Psychiatric:        Mood and Affect: Mood normal.        Behavior: Behavior normal.    Assessment and Plan   Jamesetta Sohyllis was seen today for follow-up and referral to eye doctor.  Diagnoses and all orders for this visit:  Hyperlipidemia, unspecified hyperlipidemia type -     Comprehensive metabolic panel -     Lipid panel  Essential hypertension  Closed fracture of right foot with routine healing, subsequent encounter -     DG Foot Complete Right  Iron deficiency anemia, unspecified iron deficiency anemia type -     CBC with Differential/Platelet  Imbalance -     For home use only DME Other see comment   . Orders and follow up as documented in EpicCare, reviewed diet, exercise and weight control, cardiovascular risk and specific lipid/LDL goals reviewed, reviewed medications and  side effects in detail.  . Reviewed expectations re: course of current medical issues. . Outlined signs and symptoms indicating need for more acute intervention. . Patient verbalized understanding and all questions were answered. . Patient received an After Visit Summary.   Helane RimaErica Mariajose Mow, DO [place] 03/14/2019

## 2019-03-17 ENCOUNTER — Encounter: Payer: Self-pay | Admitting: Family Medicine

## 2019-04-03 ENCOUNTER — Ambulatory Visit (INDEPENDENT_AMBULATORY_CARE_PROVIDER_SITE_OTHER): Payer: Medicare Other | Admitting: Family Medicine

## 2019-04-03 ENCOUNTER — Encounter: Payer: Self-pay | Admitting: Family Medicine

## 2019-04-03 DIAGNOSIS — M545 Low back pain: Secondary | ICD-10-CM

## 2019-04-03 DIAGNOSIS — M25562 Pain in left knee: Secondary | ICD-10-CM | POA: Diagnosis not present

## 2019-04-03 DIAGNOSIS — G8929 Other chronic pain: Secondary | ICD-10-CM | POA: Diagnosis not present

## 2019-04-03 NOTE — Progress Notes (Signed)
Office Visit Note   Patient: Linda [name]           Date of Birth: Oct 09, 1923           MRN: [id number] Visit Date: 04/03/2019 Requested by: Briscoe Deutscher, [place],  [place] [address] PCP: Briscoe Deutscher, DO  Subjective: Chief Complaint  Patient presents with  . Left Knee - Pain    HPI: Discuss her chronic left knee and low back pain.  Low back pain is described as tailbone pain, constant regardless of movement or whether she is sitting or lying down.  She has been to pain specialist in [place] who gave her seven different injections, none of which helped according to patient report.  She has had MRI scans which she brought in for my review on CD.  MRI in 2016 shows left-sided L3-4 and L4-5 bulges with possible nerve encroachment at both levels.  I did not see any obvious sacral abnormality.  Left knee pain is described as instability when walking.  She uses a compressive wrap with some improvement.  She does not point of much pain in the knee.  She has had injections for pain in the past which helped, but she does not want 1 today because her complaint is not pain but rather instability.  She feels like a new neoprene sleeve would benefit her.  Incidentally her right foot fracture pain has gotten steadily better.               ROS: No fevers or chills.  All other systems were reviewed and are negative.  Objective: Vital Signs: There were no vitals taken for this visit.  Physical Exam:  General:  Alert and oriented, in no acute distress. Pulm:  Breathing unlabored. Psy:  Normal mood, congruent affect. Skin: No rash on the skin. Low back: She is tender to palpation at the mid to lower sacrum. Left knee: 1+ effusion with no warmth or erythema.  Ligaments feel stable.  No significant joint line tenderness today.  Imaging: None today.  X-rays from [place] brought with her on CD were reviewed.  Knee x-rays from 3 years ago have a surprisingly good joint space.   Lumbar x-rays and MRI scan results are as described above.  Assessment & Plan: 1.  Chronic low back pain, really seems to be more sacral pain, could be from arthritis. -She states that her pain is manageable with Tylenol.  She is not sure she wants to try any more injections, but she would like me to have Dr. Ernestina Patches look at her films to see what he thinks about the possibility of a sacral injection.  2.  Left knee instability symptoms -She did not like our neoprene sleeves so she will look for something over-the-counter.     Procedures: No procedures performed  No notes on file     PMFS History: Patient Active Problem List   Diagnosis Date Noted  . Chronic cough 02/03/2019  . Thoracic back pain 10/13/2018  . Other fatigue 10/13/2018  . Hyperlipidemia 10/13/2018  . Vitamin D deficiency 10/13/2018  . Iron deficiency anemia 10/13/2018  . Gastroesophageal reflux disease without esophagitis 10/13/2018  . Chronic midline thoracic back pain 10/13/2018  . Osteoporosis without current pathological fracture 10/13/2018  . Essential hypertension 10/13/2018  . Stenosis of carotid artery 10/13/2018  . History of thyroid nodule, s/p bx, benign 10/13/2018  . Living in assisted living 10/13/2018  . Aortic cusp regurgitation 10/13/2018  . Nonrheumatic tricuspid  valve regurgitation 10/13/2018  . Coccyalgia 10/13/2018  . Insulin resistance 10/13/2018  . Gait instability 10/13/2018  . Assistance needed with transportation, no longer drives due to vision 10/13/2018  . Irritable bowel syndrome 10/13/2018  . Bilateral hearing loss 10/13/2018  . Dysthymia 10/13/2018  . Chronic rhinitis 10/13/2018  . Paroxysmal atrial tachycardia (HCC) 10/13/2018   Past Medical History:  Diagnosis Date  . Arthritis   . Glaucoma   . Hyperlipidemia   . Hypertension     Family History  Problem Relation Age of Onset  . Heart disease Mother   . Heart disease Father   . Heart disease Brother   . Heart  disease Brother     Past Surgical History:  Procedure Laterality Date  . APPENDECTOMY     Social History   Occupational History  . Occupation: Retired  Tobacco Use  . Smoking status: Former Smoker    Packs/day: 0.25    Years: 2.00    Pack years: 0.50    Types: Cigarettes    Start date: 131950    Quit date: 1952    Years since quitting: 68.6  . Smokeless tobacco: Never Used  Substance and Sexual Activity  . Alcohol use: Not Currently    Frequency: Never  . Drug use: Never  . Sexual activity: Not on file

## 2019-04-09 NOTE — Progress Notes (Signed)
Please tell pt that Dr. Ernestina Patches looked at the CDs.  We discussed doing an injection into the tailbone area.  He feels it would be worth trying.  If she wants to proceed, we can make a referral.

## 2019-04-10 ENCOUNTER — Telehealth: Payer: Self-pay

## 2019-04-10 DIAGNOSIS — G8929 Other chronic pain: Secondary | ICD-10-CM

## 2019-04-10 DIAGNOSIS — M7918 Myalgia, other site: Secondary | ICD-10-CM

## 2019-04-10 DIAGNOSIS — M545 Low back pain, unspecified: Secondary | ICD-10-CM

## 2019-04-10 NOTE — Telephone Encounter (Signed)
Patient called concerning her disc for her MRI.  Cb# is [phone].  Please advise.  Thank you.

## 2019-04-10 NOTE — Telephone Encounter (Signed)
I called the patient. I left a full message on her voice mail: Dr. Ernestina Patches looked at the CDs as well, and said she may benefit from an injection. She is to call back and let us know if she would like to proceed with the injection. I have her CDs - will await a return call from her.

## 2019-04-11 NOTE — Telephone Encounter (Signed)
I spoke with the patient: she would like to proceed with the injection with Dr. Ernestina Patches. She will pick up the CDs at that appointment.

## 2019-04-11 NOTE — Addendum Note (Signed)
Addended by: Hortencia Pilar on: 04/11/2019 03:58 PM   Modules accepted: Orders

## 2019-04-11 NOTE — Telephone Encounter (Signed)
Referral placed.

## 2019-04-14 ENCOUNTER — Encounter: Payer: Self-pay | Admitting: Family Medicine

## 2019-04-14 ENCOUNTER — Other Ambulatory Visit: Payer: Self-pay | Admitting: Family Medicine

## 2019-04-15 NOTE — Telephone Encounter (Signed)
Last fill 10/09/18  #90/1 Last OV 03/14/19 Next OV 06/20/19

## 2019-04-18 MED ORDER — SOTALOL HCL 80 MG PO TABS: 80.0000 mg | ORAL_TABLET | Freq: Two times a day (BID) | ORAL | 1 refills | Status: DC

## 2019-04-23 ENCOUNTER — Telehealth: Payer: Self-pay | Admitting: Physical Therapy

## 2019-04-23 NOTE — Telephone Encounter (Signed)
Copied from [place] [id number]. Topic: General - Other >> Apr 23, 2019  2:38 PM Rayann Heman wrote: Reason for CRM: pt called and stated that she would like to know when she has her last pneumonia shot. Please advise

## 2019-04-24 NOTE — Telephone Encounter (Signed)
L/m to call office if any questions. I have informed in message that I will send my chart with information requested. I have checked our records as well as [facility] and no Pneumonia shot has been given

## 2019-05-02 ENCOUNTER — Ambulatory Visit: Payer: Self-pay

## 2019-05-02 ENCOUNTER — Ambulatory Visit (INDEPENDENT_AMBULATORY_CARE_PROVIDER_SITE_OTHER): Payer: Medicare Other | Admitting: Physical Medicine and Rehabilitation

## 2019-05-02 ENCOUNTER — Encounter: Payer: Self-pay | Admitting: Physical Medicine and Rehabilitation

## 2019-05-02 DIAGNOSIS — M533 Sacrococcygeal disorders, not elsewhere classified: Secondary | ICD-10-CM | POA: Diagnosis not present

## 2019-05-02 DIAGNOSIS — R102 Pelvic and perineal pain: Secondary | ICD-10-CM

## 2019-05-02 MED ORDER — BUPIVACAINE HCL 0.5 % IJ SOLN
2.0000 mL | INTRAMUSCULAR | Status: AC | PRN
  Administered 2019-05-02: 2 mL via INTRA_ARTICULAR

## 2019-05-02 MED ORDER — METHYLPREDNISOLONE ACETATE 80 MG/ML IJ SUSP
80.0000 mg | INTRAMUSCULAR | Status: AC | PRN
  Administered 2019-05-02: 80 mg via INTRA_ARTICULAR

## 2019-05-02 NOTE — Progress Notes (Signed)
Linda [name] - 85 y.o. female MRN [id number]  Date of birth: 1924/07/31  Office Visit Note: Visit Date: 05/02/2019 PCP: Briscoe Deutscher, DO Referred by: Briscoe Deutscher, DO  Subjective: Chief Complaint  Patient presents with  . Pelvic Pain    Coccydynia   HPI:  Linda [name] is a 85 y.o. female who comes in today The request of Dr. Junius Roads today for fluoroscopically guided sacrococcygeal or coccygeal injection to treat chronic coccydynia.  Patient reports some history of trauma and falling on her backside with continued chronic pain.  She points really to an area somewhat lower than you typically see for the sacrococcyx junction but it is fairly close.  She had had a CD of images that Dr. Junius Roads had reviewed with me.  As an aside she asked today if the injection would help the numbness in her leg.  I was not sure of any numbness in her leg or any reason for her to have numbness.  Appears that she is maybe had some spinal injections performed in [place] and it sounded like maybe this was done at a surgery center with IV sedation by the way she talks about it.  She denies any specific back surgery.  If this is something that is really an issue I think it something Dr. Junius Roads could review and then ultimately and if we need to do look at that as well I be happy to do that.  This was not really evaluated today and I told the patient doing way to do that assertive figure out why she was having numbness.  We hope that will help the coccydynia pain today by doing fluoroscopically guided injection.  ROS Otherwise per HPI.  Assessment & Plan: Visit Diagnoses:  1. Coccydynia   2. Pelvic pain     Plan: Findings:  Follow-up with Dr. Junius Roads in terms of coccydynia and potential for any ideas about the numbness in the right leg.    Meds & Orders: No orders of the defined types were placed in this encounter.   Orders Placed This Encounter  Procedures  . Large Joint Inj  . XR C-ARM NO REPORT    Follow-up:  No follow-ups on file.   Procedures: Large Joint Inj (Coccyx - C1-2) on 05/02/2019 11:53 AM Indications: diagnostic evaluation and pain Details: 22 G 3.5 in needle, fluoroscopy-guided posterior approach  Arthrogram: No  Medications: 2 mL bupivacaine 0.5 %; 80 mg methylPREDNISolone acetate 80 MG/ML Outcome: tolerated well, no immediate complications  Biplanar imaging was utilized position of the needle tip along with midline of the sacrococcygeal area.  This was over the area of greatest pain.  Flow contrast did not show into intravascular or peritoneal flow. Procedure, treatment alternatives, risks and benefits explained, specific risks discussed. Consent was given by the patient. Immediately prior to procedure a time out was called to verify the correct patient, procedure, equipment, support staff and site/side marked as required. Patient was prepped and draped in the usual sterile fashion.      No notes on file   Clinical History: No specialty comments available.     Objective:  VS:  HT:    WT:   BMI:     BP:   HR: bpm  TEMP: ( )  RESP:  Physical Exam Musculoskeletal:     Comments: Patient ambulates with a rolling walker.  She is slow to rise from a seated position.  She does have focal tenderness over the midline of the sacrococcygeal junction.  No pain with hip movement.  No pain over the PSIS.     Ortho Exam Imaging: Xr C-arm No Report  Result Date: 05/02/2019 Please see Notes tab for imaging impression.

## 2019-05-02 NOTE — Progress Notes (Signed)
 .  Numeric Pain Rating Scale and Functional Assessment Average Pain 10   In the last MONTH (on 0-10 scale) has pain interfered with the following?  1. General activity like being  able to carry out your everyday physical activities such as walking, climbing stairs, carrying groceries, or moving a chair?  Rating(8)   -Dye Allergies.  

## 2019-05-08 ENCOUNTER — Other Ambulatory Visit: Payer: Self-pay

## 2019-05-08 ENCOUNTER — Encounter: Payer: Self-pay | Admitting: Emergency Medicine

## 2019-05-08 ENCOUNTER — Encounter: Payer: Self-pay | Admitting: Family Medicine

## 2019-05-08 ENCOUNTER — Ambulatory Visit (INDEPENDENT_AMBULATORY_CARE_PROVIDER_SITE_OTHER): Payer: Medicare Other | Admitting: Emergency Medicine

## 2019-05-08 VITALS — BP 112/66 | HR 70

## 2019-05-08 DIAGNOSIS — R053 Chronic cough: Secondary | ICD-10-CM

## 2019-05-08 DIAGNOSIS — I6529 Occlusion and stenosis of unspecified carotid artery: Secondary | ICD-10-CM

## 2019-05-08 DIAGNOSIS — R05 Cough: Secondary | ICD-10-CM

## 2019-05-08 MED ORDER — DOXYCYCLINE HYCLATE 100 MG PO TABS: 100.0000 mg | ORAL_TABLET | Freq: Two times a day (BID) | ORAL | 0 refills | Status: DC

## 2019-05-08 NOTE — Assessment & Plan Note (Signed)
No significant change in her rhinitis, thick saliva with the addition of loratadine to radicular nasal spray.  She states that her mucus does have some purulence.  May be some benefit to treating empirically for bronchitis.  Unclear why she has had recurrent symptoms but suspect some degree of obstructive lung disease, possible bronchiectasis.  Consider incomplete treatment of her rhinitis and also possible silent GERD.  She increased her Pepcid to daily last time and did not notice a difference in the cough.  Her chest x-ray shows a possible mediastinal density and she needs a CT to better characterize.    Please take doxycycline 100 mg twice a day for the next 7 days Continue your loratadine (Claritin), fluticasone nasal spray every day Take Pepcid 20 mg daily as needed for reflux, heartburn We will arrange for a CT scan of the chest We will perform pulmonary function testing in next office visit Follow with Dr. Lamonte Sakai next available with full pulmonary function testing on the same day

## 2019-05-08 NOTE — Patient Instructions (Addendum)
Please take doxycycline 100 mg twice a day for the next 7 days Continue your loratadine (Claritin), fluticasone nasal spray every day Take Pepcid 20 mg daily as needed for reflux, heartburn We will arrange for a CT scan of the chest We will perform pulmonary function testing in next office visit Follow with Dr. Lamonte Sakai next available with full pulmonary function testing on the same day.

## 2019-05-08 NOTE — Progress Notes (Signed)
Subjective:    Patient ID: Linda [name], female    DOB: 11/11/1923, 85 y.o.   MRN: [id number]  HPI 85 year old former smoker (1-2 pack years) with a history of hypertension, hyperlipidemia, glaucoma, chronic rhinitis, chronic atrial tachycardia, GERD.  She is here today for chronic cough. She has some nasal congestion, but she feels saliva in her mouth that can sometimes choke her up. In retrospect this sounds more like mucous.  She coughs up yellowish mucous. She uses fluticasone NS daily. She is on pepcid also has zantac on her MAR, denies any overt GERD sx. She takes mucinex occasionally with some relief  PFT reviewed from 2014 [place]: FEV1 1.75 (92% predicted), possible mixed obstruction and restriction, but mild.   She had a CT chest 06/18/2018 in [place] and I have the report available.  There was some subtle biapical interstitial scar but otherwise no focal consolidations, masses, other parenchymal abnormalities.  No effusion or lymphadenopathy.  Overall reassuring study.  ROV 05/08/2019 --this a follow-up visit for pleasant 85 year old woman with a minimal tobacco history for chronic cough.  She has hypertension, hyperlipidemia, glaucoma, chronic rhinitis, GERD.  Based on her initial evaluation I suspected that her rhinitis was active even on fluticasone nasal spray.  We added loratadine.  Also changed her Pepcid to once daily on a schedule - now back to prn.  She returns today reporting that her cough hasn't changed much at all - still with some colored mucous.  A chest x-ray done 02/04/2019 identified a questionable density in the upper mediastinum, some evidence for obstructive lung disease.  Continue flonase, loratdine. pepcid prn.  Full PFT CT chest.     Review of Systems  Respiratory: Positive for cough.    Past Medical History:  Diagnosis Date  . Arthritis   . Glaucoma   . Hyperlipidemia   . Hypertension      Family History  Problem Relation Age of Onset  . Heart  disease Mother   . Heart disease Father   . Heart disease Brother   . Heart disease Brother      Social History   Socioeconomic History  . Marital status: Widowed    Spouse name: Not on file  . Number of children: Not on file  . Years of education: Not on file  . Highest education level: Not on file  Occupational History  . Occupation: Retired  Engineer, productionocial Needs  . Financial resource strain: Not on file  . Food insecurity    Worry: Not on file    Inability: Not on file  . Transportation needs    Medical: Not on file    Non-medical: Not on file  Tobacco Use  . Smoking status: Former Smoker    Packs/day: 0.25    Years: 2.00    Pack years: 0.50    Types: Cigarettes    Start date: 781950    Quit date: 1952    Years since quitting: 68.7  . Smokeless tobacco: Never Used  Substance and Sexual Activity  . Alcohol use: Not Currently    Frequency: Never  . Drug use: Never  . Sexual activity: Not on file  Lifestyle  . Physical activity    Days per week: Not on file    Minutes per session: Not on file  . Stress: Not on file  Relationships  . Social Musicianconnections    Talks on phone: Not on file    Gets together: Not on file    Attends religious service:  Not on file    Active member of club or organization: Not on file    Attends meetings of clubs or organizations: Not on file    Relationship status: Not on file  . Intimate partner violence    Fear of current or ex partner: Not on file    Emotionally abused: Not on file    Physically abused: Not on file    Forced sexual activity: Not on file  Other Topics Concern  . Not on file  Social History Narrative  . Not on file     No Known Allergies   Outpatient Medications Prior to Visit  Medication Sig Dispense Refill  . acetaminophen (TYLENOL) 325 MG tablet Take 650 mg by mouth every 6 (six) hours as needed.    Marland Kitchen aspirin EC 81 MG tablet Take 81 mg by mouth daily.    . Cholecalciferol (VITAMIN D3) 25 MCG (1000 UT) CAPS Take 1  capsule by mouth daily.    . ciclopirox (LOPROX) 0.77 % cream Apply topically 2 (two) times daily.    . clobetasol cream (TEMOVATE) 0.05 % Apply 1 application topically as needed.    . famotidine (PEPCID) 20 MG tablet Take 20 mg by mouth as needed for heartburn or indigestion.    Marland Kitchen FLUAD 0.5 ML SUSY     . fluticasone (FLONASE) 50 MCG/ACT nasal spray     . Multiple Vitamin (MULTIVITAMIN) tablet Take 1 tablet by mouth daily.    . rosuvastatin (CRESTOR) 20 MG tablet TAKE 1 TABLET DAILY 90 tablet 3  . sotalol (BETAPACE) 80 MG tablet Take 1 tablet (80 mg total) by mouth every 12 (twelve) hours. 180 tablet 1  . timolol (TIMOPTIC) 0.25 % ophthalmic solution     . vitamin k 100 MCG tablet Take 100 mcg by mouth daily.    . ranitidine (ZANTAC) 150 MG tablet      No facility-administered medications prior to visit.           Objective:   Physical Exam Vitals:   05/08/19 1103  BP: 112/66  Pulse: 70  SpO2: 97%   Gen: Pleasant, thin elderly woman, in no distress,  normal affect  ENT: No lesions,  mouth clear,  oropharynx clear, no postnasal drip  Neck: No JVD, no stridor  Lungs: No use of accessory muscles, no crackles or wheezing on normal respiration, no wheeze on forced expiration  Cardiovascular: RRR, heart sounds normal, no murmur or gallops, no peripheral edema  Musculoskeletal: No deformities, no cyanosis or clubbing  Neuro: alert, awake, non focal  Skin: Warm, no lesions or rash      Assessment & Plan:  Chronic cough No significant change in her rhinitis, thick saliva with the addition of loratadine to radicular nasal spray.  She states that her mucus does have some purulence.  May be some benefit to treating empirically for bronchitis.  Unclear why she has had recurrent symptoms but suspect some degree of obstructive lung disease, possible bronchiectasis.  Consider incomplete treatment of her rhinitis and also possible silent GERD.  She increased her Pepcid to daily last  time and did not notice a difference in the cough.  Her chest x-ray shows a possible mediastinal density and she needs a CT to better characterize.    Please take doxycycline 100 mg twice a day for the next 7 days Continue your loratadine (Claritin), fluticasone nasal spray every day Take Pepcid 20 mg daily as needed for reflux, heartburn We will arrange for a  CT scan of the chest We will perform pulmonary function testing in next office visit Follow with Dr. Lamonte Sakai next available with full pulmonary function testing on the same day  Baltazar Apo, MD, PhD 05/08/2019, 11:31 AM [facility] [phone] or if no answer [phone]

## 2019-05-14 ENCOUNTER — Inpatient Hospital Stay: Admission: RE | Admit: 2019-05-14 | Payer: Medicare Other | Source: Ambulatory Visit

## 2019-05-16 ENCOUNTER — Ambulatory Visit: Payer: Medicare Other | Admitting: Family Medicine

## 2019-05-28 ENCOUNTER — Ambulatory Visit (INDEPENDENT_AMBULATORY_CARE_PROVIDER_SITE_OTHER)
Admission: RE | Admit: 2019-05-28 | Discharge: 2019-05-28 | Disposition: A | Discharge status: Home / Self Care | Payer: Medicare Other | Source: Ambulatory Visit | Attending: Emergency Medicine | Admitting: Emergency Medicine

## 2019-05-28 ENCOUNTER — Other Ambulatory Visit: Payer: Self-pay

## 2019-05-28 DIAGNOSIS — R05 Cough: Secondary | ICD-10-CM

## 2019-05-28 DIAGNOSIS — R053 Chronic cough: Secondary | ICD-10-CM

## 2019-05-28 IMAGING — CT CT CHEST W/O CM
2 of 3 series · 15 of 36 positions shown, 18 images · non-contrast
Comparison: None.

CLINICAL DATA: Chronic cough.

EXAM:
CT CHEST WITHOUT CONTRAST
TECHNIQUE: Multidetector CT imaging of the chest was performed following the
standard protocol without IV contrast.

[Series 2: thorax · axial · 0.58mm/px · z∈[-306,-34]mm · 12 of 160 slices shown, 15 images]
[im 12/160  mediastinal]
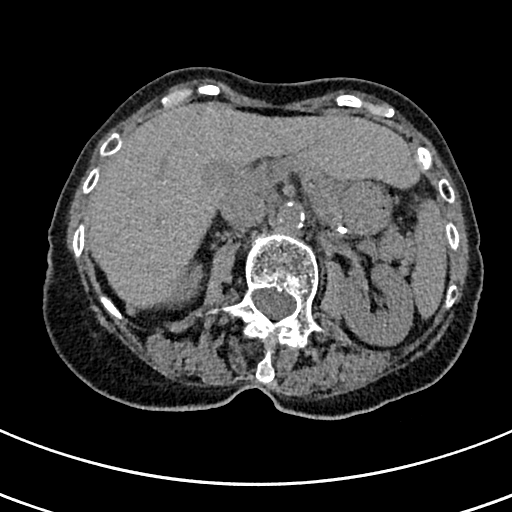
[im 12/160  lung]
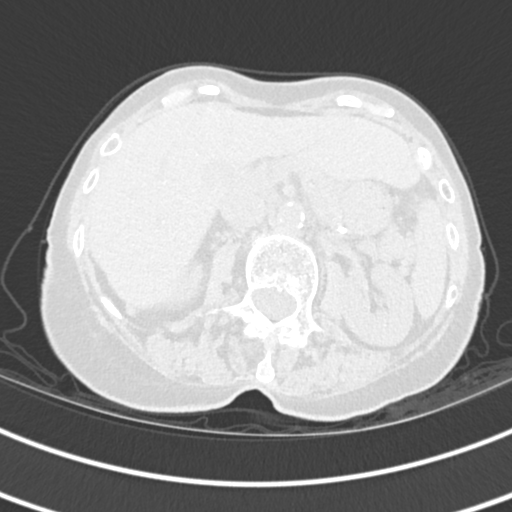
[im 24/160  lung]
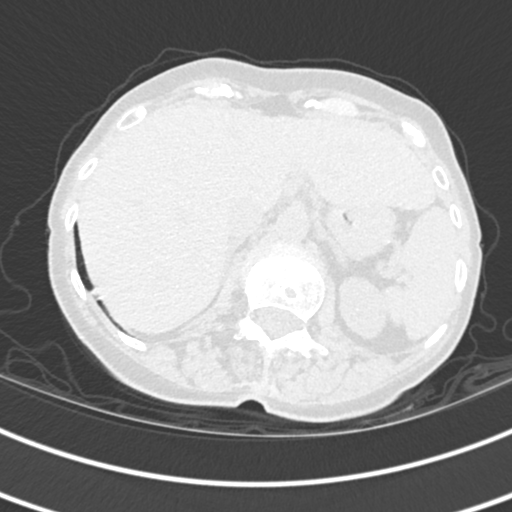
[im 36/160  lung]
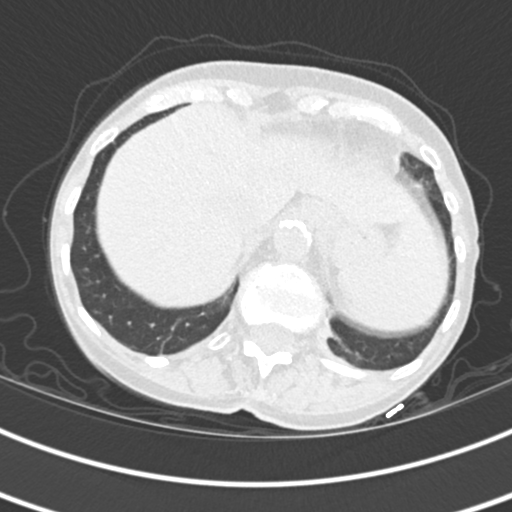
[im 48/160  lung]
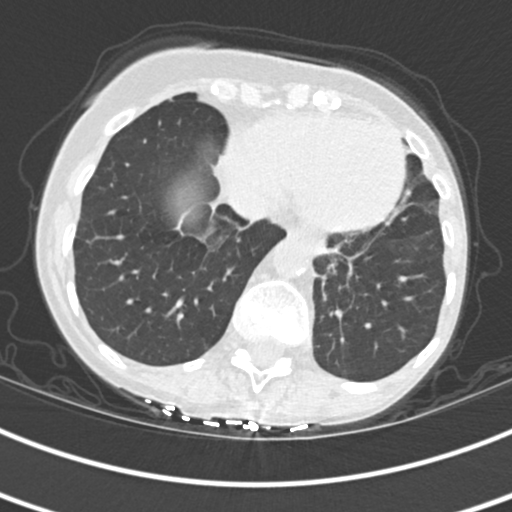
[im 59/160  mediastinal]
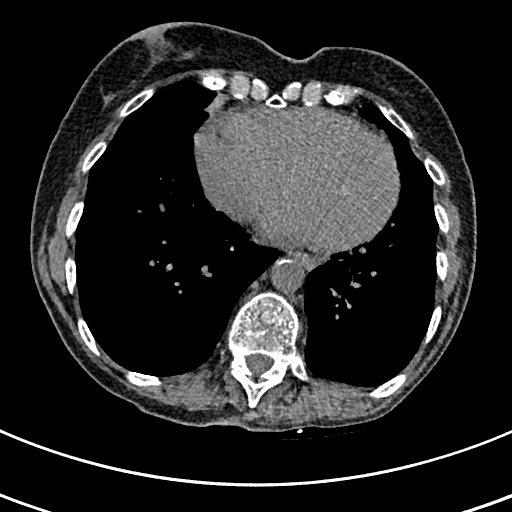
[im 59/160  lung]
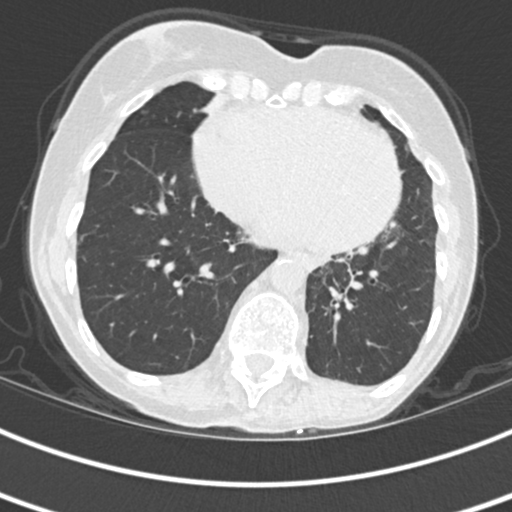
[im 71/160  lung]
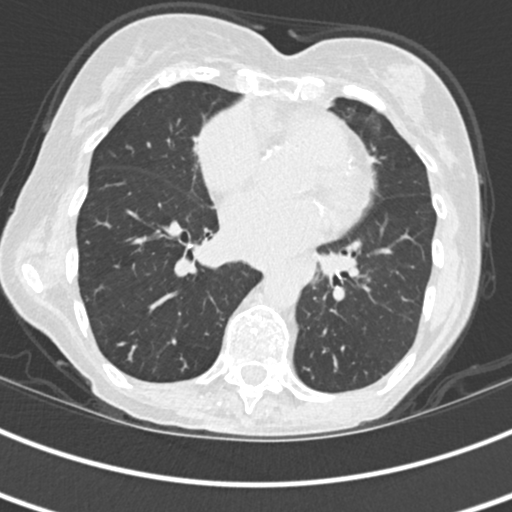
[im 89/160  lung]
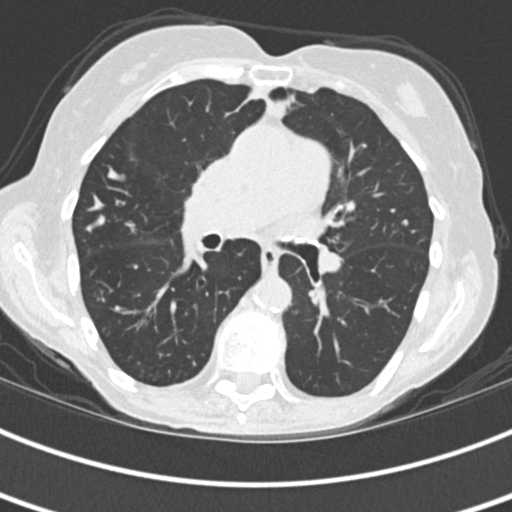
[im 101/160  lung]
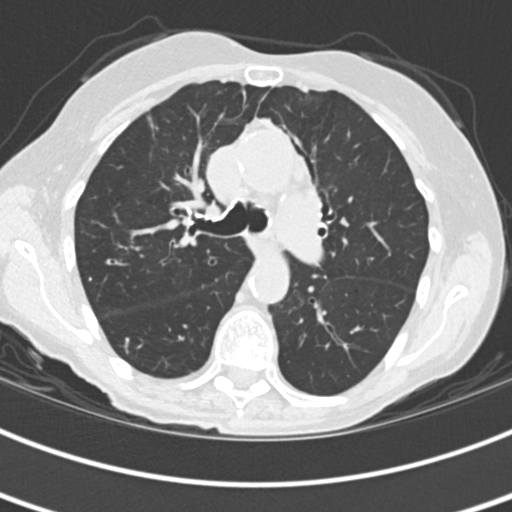
[im 112/160  mediastinal]
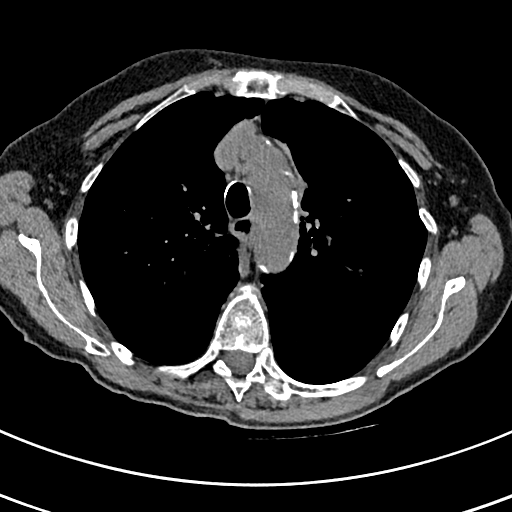
[im 112/160  lung]
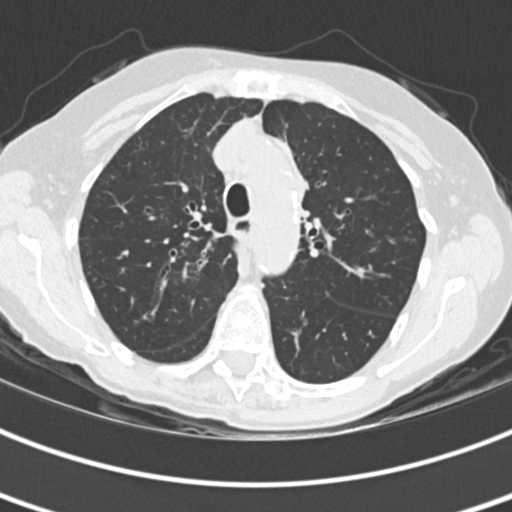
[im 124/160  lung]
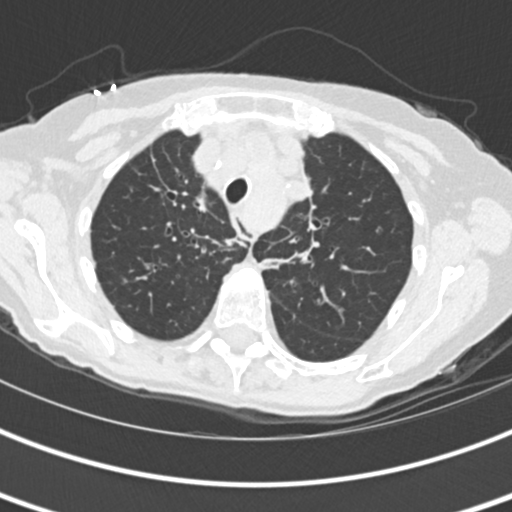
[im 136/160  lung]
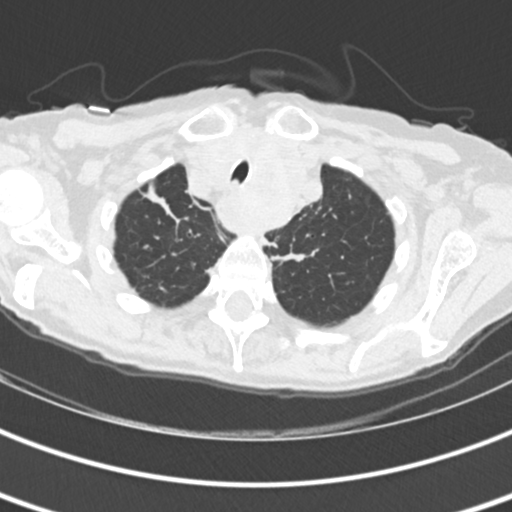
[im 148/160  lung]
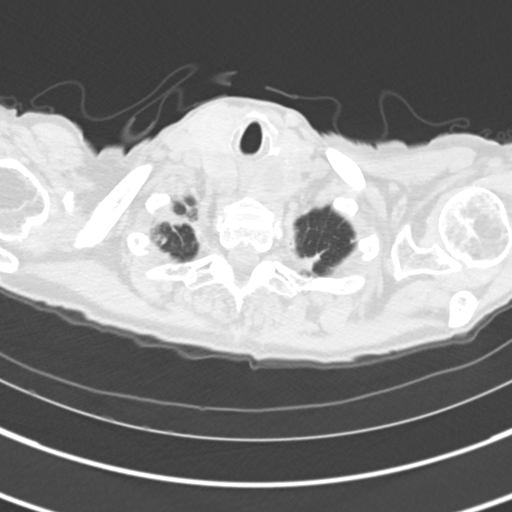

[Series 5: coronal · coronal · 0.62mm/px · 3 of 126 slices shown]
[im 26/126  lung]
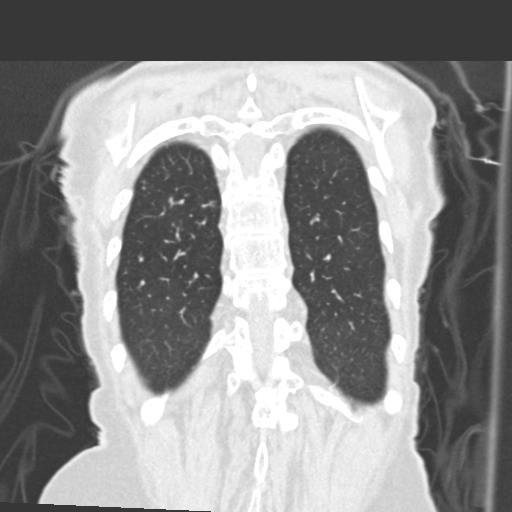
[im 51/126  lung]
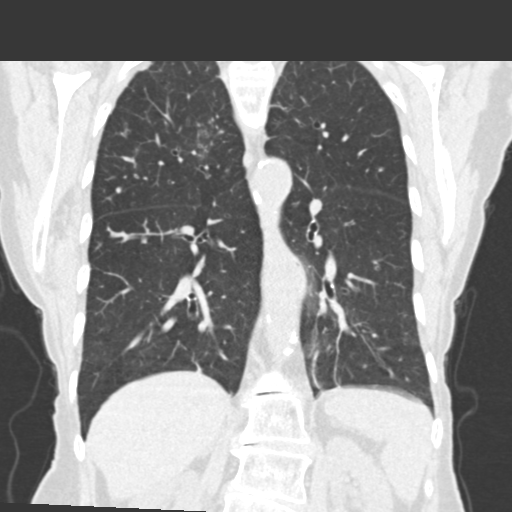
[im 76/126  lung]
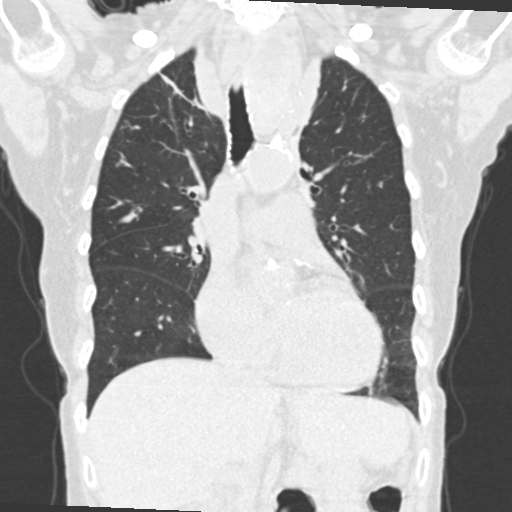

[15 of 36 positions shown; findings below may reference images not displayed]

FINDINGS: Cardiovascular: Atherosclerotic calcification of the aorta, aortic
valve and coronary arteries. Heart is at the upper limits of normal
in size. No pericardial effusion.

Mediastinum/Nodes: Thyroid is asymmetrically enlarged on the left
with a dominant low-attenuation nodule measuring approximately
cm ([DATE]). Rightward mass effect on the trachea and esophagus. No
pathologically enlarged mediastinal or axillary lymph nodes.
Esophagus is otherwise unremarkable.

Lungs/Pleura: Biapical pleuroparenchymal scarring. Mild diffuse
bronchiectasis, mucoid impaction and peribronchovascular nodularity
bilaterally. Scarring along the minor fissure. No pleural fluid.
Airway is otherwise unremarkable.

Upper Abdomen: Visualized portions of the liver, adrenal glands,
kidneys, spleen, pancreas, stomach and bowel are grossly
unremarkable. No upper abdominal adenopathy.

Musculoskeletal: Degenerative changes in the spine. No worrisome
lytic or sclerotic lesions.
IMPRESSION: 1. Mild diffuse bronchiectasis, mucoid impaction and
peribronchovascular nodularity, findings indicative of mycobacterium
avium complex.
2. Asymmetrically enlarged left thyroid with nodules measuring up to
2.9 cm. Given patient's age, further assessment with thyroid
ultrasound may not be clinically indicated.
3. Aortic atherosclerosis ([T8]-170.0). Coronary artery
calcification.

## 2019-06-17 NOTE — Telephone Encounter (Signed)
Copied from [place] [phone]. Topic: General - Other >> Jun 17, 2019  9:32 AM Leward Quan A wrote: Reason for CRM: Patient daughter Eric Morganti called to request that Dr Rogers Blocker become her moms new PCP she also stated that her mom gets blood work every three months due to her diagnosis and she was scheduled for a TOC appointment on 09/10/19 she would like to know if Dr Rogers Blocker can please order patients blood work so that she can be scheduled and come in and have this done. Please call Malachy Mood at Ph#  [phone]

## 2019-06-19 ENCOUNTER — Telehealth: Payer: Self-pay | Admitting: Family Medicine

## 2019-06-19 NOTE — Telephone Encounter (Signed)
Is it ok to schedule her for a f/u office visit before her TOC appt.   Copied from [place] [id number]. Topic: General - Other >> Jun 19, 2019  8:04 AM Lennox Solders wrote: Reason for CRM: pt daughter cheryl is calling and would  like her mother to be seen for  before jan 2021. Pt normally sees doctor every 3 months for blood work etc >> Jun 19, 2019  8:18 AM Richardo Priest, NT wrote: Pt's daughter called in again stating she is upset that mother cannot be seen sooner. Pt's daughter stated this is bad healthcare and her mother should not have to suffer or miss appointments due to a practitioner leaving. Please advise.

## 2019-06-19 NOTE — Telephone Encounter (Signed)
Spoke to patient's daughter, Malachy Mood.  Patient scheduled for doxy visit for 11/19 and also scheduled lab appointment on 11/16.  Please advise for lab orders.

## 2019-06-19 NOTE — Telephone Encounter (Signed)
jen is scheduling her sooner.  Orma Flaming, MD [place]

## 2019-06-20 ENCOUNTER — Ambulatory Visit: Payer: Medicare Other | Admitting: Family Medicine

## 2019-06-30 ENCOUNTER — Other Ambulatory Visit: Payer: Medicare Other

## 2019-07-01 ENCOUNTER — Telehealth: Payer: Self-pay | Admitting: Family Medicine

## 2019-07-01 NOTE — Telephone Encounter (Signed)
Spoke to daughter and r/s TOC appt from 11/19 to 11/23 for an in office visit @ 10:40.

## 2019-07-01 NOTE — Telephone Encounter (Signed)
Copied from [place] [phone]. Topic: General - Other >> Jul 01, 2019  8:29 AM Leward Quan A wrote: Reason for CRM: Patient daughter Malachy Mood called to say that her mothers insurance will not pay for a virtual visit so she needed to cancel her appointment on 07/03/2019 to get an in office visit some time next week anytime after 11 AM. She states that her mother have been on a regimen where she gets blood work every 3 months just to keep up with her proper care. Malachy Mood would like a call back please at Ph#  [phone]  Sending this to [facility], please advise.

## 2019-07-03 ENCOUNTER — Telehealth: Payer: Self-pay | Admitting: Emergency Medicine

## 2019-07-03 ENCOUNTER — Ambulatory Visit: Payer: Medicare Other | Admitting: Family Medicine

## 2019-07-03 NOTE — Telephone Encounter (Signed)
I spoke to dtr & scheduled pt's covid test.  Nothing further needed

## 2019-07-06 ENCOUNTER — Encounter: Payer: Self-pay | Admitting: Family Medicine

## 2019-07-07 ENCOUNTER — Telehealth: Payer: Self-pay | Admitting: Family Medicine

## 2019-07-07 ENCOUNTER — Ambulatory Visit: Payer: Medicare Other | Admitting: Family Medicine

## 2019-07-07 DIAGNOSIS — K219 Gastro-esophageal reflux disease without esophagitis: Secondary | ICD-10-CM

## 2019-07-07 MED ORDER — FAMOTIDINE 20 MG PO TABS: 20.0000 mg | ORAL_TABLET | ORAL | 1 refills | Status: DC | PRN

## 2019-07-07 NOTE — Telephone Encounter (Signed)
°  LAST APPOINTMENT DATE: @@LASTENCT @  NEXT APPOINTMENT DATE:@Visit  date not found  MEDICATION: famotidine (PEPCID) 20 MG tablet  PHARMACY: [place] [place] [phone] (Phone) [phone] (Fax)     **Let patient know to contact pharmacy at the end of the day to make sure medication is ready. **  ** Please notify patient to allow 48-72 hours to process**  **Encourage patient to contact the pharmacy for refills or they can request refills through [facility]**  CLINICAL FILLS OUT ALL BELOW:   LAST REFILL:  QTY:  REFILL DATE:    OTHER COMMENTS:    Okay for refill?  Please advise

## 2019-07-07 NOTE — Telephone Encounter (Signed)
Ok to refill? Last OV was 03/14/19.  Next OV is 12/14 with [place] for TOC.

## 2019-07-07 NOTE — Addendum Note (Signed)
Addended by: Orma Flaming on: 07/07/2019 11:36 AM   Modules accepted: Orders

## 2019-07-07 NOTE — Telephone Encounter (Signed)
Filled.  Lonald Troiani, MD [place]   

## 2019-07-07 NOTE — Telephone Encounter (Signed)
Patient has been r/s for in office visit for 12/14.

## 2019-07-11 ENCOUNTER — Other Ambulatory Visit (HOSPITAL_COMMUNITY)
Admission: RE | Admit: 2019-07-11 | Discharge: 2019-07-11 | Disposition: A | Discharge status: Home / Self Care | Payer: Medicare Other | Source: Ambulatory Visit | Attending: Emergency Medicine | Admitting: Emergency Medicine

## 2019-07-11 DIAGNOSIS — Z01812 Encounter for preprocedural laboratory examination: Secondary | ICD-10-CM | POA: Insufficient documentation

## 2019-07-11 DIAGNOSIS — Z20828 Contact with and (suspected) exposure to other viral communicable diseases: Secondary | ICD-10-CM | POA: Diagnosis not present

## 2019-07-11 LAB — SARS CORONAVIRUS 2 (TAT 6-24 HRS): SARS Coronavirus 2: NEGATIVE

## 2019-07-14 ENCOUNTER — Ambulatory Visit (INDEPENDENT_AMBULATORY_CARE_PROVIDER_SITE_OTHER): Payer: Medicare Other | Admitting: Emergency Medicine

## 2019-07-14 ENCOUNTER — Encounter: Payer: Self-pay | Admitting: Emergency Medicine

## 2019-07-14 ENCOUNTER — Other Ambulatory Visit: Payer: Self-pay

## 2019-07-14 DIAGNOSIS — R05 Cough: Secondary | ICD-10-CM | POA: Diagnosis not present

## 2019-07-14 DIAGNOSIS — R053 Chronic cough: Secondary | ICD-10-CM

## 2019-07-14 DIAGNOSIS — J479 Bronchiectasis, uncomplicated: Secondary | ICD-10-CM

## 2019-07-14 DIAGNOSIS — I6529 Occlusion and stenosis of unspecified carotid artery: Secondary | ICD-10-CM

## 2019-07-14 LAB — PULMONARY FUNCTION TEST
DL/VA: 3.96 ml/min/mmHg/L
DLCO unc: 12.92 ml/min/mmHg
FEF 25-75 Post: 0.94 L/sec
FEF 25-75 Pre: 0.73 L/sec
FEF2575-%Change-Post: 28 %
FEF2575-%Pred-Post: 132 %
FEF2575-%Pred-Pre: 102 %
FEV1-%Change-Post: 5 %
FEV1-%Pred-Post: 93 %
FEV1-%Pred-Pre: 88 %
FEV1-Post: 1.36 L
FEV1-Pre: 1.29 L
FEV1FVC-%Change-Post: 10 %
FEV1FVC-%Pred-Pre: 99 %
FEV6-%Change-Post: -3 %
FEV6-%Pred-Post: 94 %
FEV6-%Pred-Pre: 98 %
FEV6-Post: 1.75 L
FEV6-Pre: 1.82 L
FEV6FVC-%Change-Post: 0 %
FEV6FVC-%Pred-Post: 107 %
FEV6FVC-%Pred-Pre: 107 %
FVC-%Change-Post: -4 %
FVC-%Pred-Post: 87 %
FVC-%Pred-Pre: 91 %
FVC-Post: 1.75 L
FVC-Pre: 1.83 L
Post FEV1/FVC ratio: 78 %
Post FEV6/FVC ratio: 100 %
Pre FEV1/FVC ratio: 70 %
Pre FEV6/FVC Ratio: 99 %
RV % pred: 99 %
RV: 2.65 L
TLC % pred: 86 %
TLC: 4.36 L

## 2019-07-14 MED ORDER — AZITHROMYCIN 250 MG PO TABS: ORAL_TABLET | ORAL | 0 refills | Status: DC

## 2019-07-14 NOTE — Progress Notes (Signed)
Subjective:    Patient ID: Linda [name], female    DOB: [birth year], 85 y.o.   MRN: [id number]  HPI  ROV 07/14/2019 --Linda [name] is 89, follows up today for chronic cough.  She has continued to have symptoms, produces some colored mucus, occasional difficulty managing saliva.  She has been maintained on fluticasone nasal spray, loratadine, has used Pepcid as needed.  We performed a CT scan of her chest on 10/14 that I have reviewed, shows mild diffuse bronchiectatic change with some peribronchovascular nodularity, suspicious for possible MAIC. PFTs were done today and I have reviewed, shows overall normal lung function with some curve to her flow volume loop that could suggest mild obstruction.  Her lung volumes are normal.  Diffusion capacity was not done.  She continues to have cough, mostly UA in nature. Still has thick saliva and mucous, clear. No significant SOB. Rarely she will bring up some purulent sputum from deeper. No fevers, chills, constitutional sx.    Review of Systems  Respiratory: Positive for cough.     Past Medical History:  Diagnosis Date  . Arthritis   . Glaucoma   . Hyperlipidemia   . Hypertension      Family History  Problem Relation Age of Onset  . Heart disease Mother   . Heart disease Father   . Heart disease Brother   . Heart disease Brother      Social History   Socioeconomic History  . Marital status: Widowed    Spouse name: Not on file  . Number of children: Not on file  . Years of education: Not on file  . Highest education level: Not on file  Occupational History  . Occupation: Retired  Engineer, production  . Financial resource strain: Not on file  . Food insecurity    Worry: Not on file    Inability: Not on file  . Transportation needs    Medical: Not on file    Non-medical: Not on file  Tobacco Use  . Smoking status: Former Smoker    Packs/day: 0.25    Years: 2.00    Pack years: 0.50    Types: Cigarettes    Start date: 13    Quit  date: 1952    Years since quitting: 68.9  . Smokeless tobacco: Never Used  Substance and Sexual Activity  . Alcohol use: Not Currently    Frequency: Never  . Drug use: Never  . Sexual activity: Not on file  Lifestyle  . Physical activity    Days per week: Not on file    Minutes per session: Not on file  . Stress: Not on file  Relationships  . Social Musician on phone: Not on file    Gets together: Not on file    Attends religious service: Not on file    Active member of club or organization: Not on file    Attends meetings of clubs or organizations: Not on file    Relationship status: Not on file  . Intimate partner violence    Fear of current or ex partner: Not on file    Emotionally abused: Not on file    Physically abused: Not on file    Forced sexual activity: Not on file  Other Topics Concern  . Not on file  Social History Narrative  . Not on file     No Known Allergies   Outpatient Medications Prior to Visit  Medication Sig Dispense Refill  .  acetaminophen (TYLENOL) 325 MG tablet Take 650 mg by mouth every 6 (six) hours as needed.    Marland Kitchen aspirin EC 81 MG tablet Take 81 mg by mouth daily.    . Cholecalciferol (VITAMIN D3) 25 MCG (1000 UT) CAPS Take 1 capsule by mouth daily.    . ciclopirox (LOPROX) 0.77 % cream Apply topically 2 (two) times daily.    . clobetasol cream (TEMOVATE) 1.19 % Apply 1 application topically as needed.    . famotidine (PEPCID) 20 MG tablet Take 1 tablet (20 mg total) by mouth as needed for heartburn or indigestion. 90 tablet 1  . FLUAD 0.5 ML SUSY     . fluticasone (FLONASE) 50 MCG/ACT nasal spray     . Multiple Vitamin (MULTIVITAMIN) tablet Take 1 tablet by mouth daily.    . rosuvastatin (CRESTOR) 20 MG tablet TAKE 1 TABLET DAILY 90 tablet 3  . sotalol (BETAPACE) 80 MG tablet Take 1 tablet (80 mg total) by mouth every 12 (twelve) hours. 180 tablet 1  . timolol (TIMOPTIC) 0.25 % ophthalmic solution     . vitamin k 100 MCG tablet  Take 100 mcg by mouth daily.    Marland Kitchen doxycycline (VIBRA-TABS) 100 MG tablet Take 1 tablet (100 mg total) by mouth 2 (two) times daily. 14 tablet 0   No facility-administered medications prior to visit.           Objective:   Physical Exam Vitals:   07/14/19 1148  BP: (!) 144/60  Pulse: 60  Temp: (!) 97.2 F (36.2 C)  TempSrc: Oral  SpO2: 96%  Weight: 122 lb 6.4 oz (55.5 kg)  Height: 5\' 4"  (1.626 m)   Gen: Pleasant, thin elderly woman, in no distress,  normal affect  ENT: No lesions,  mouth clear,  oropharynx clear, no postnasal drip  Neck: No JVD, no stridor  Lungs: No use of accessory muscles, no crackles or wheezing on normal respiration, no wheeze on forced expiration  Cardiovascular: RRR, heart sounds normal, no murmur or gallops, no peripheral edema  Musculoskeletal: No deformities, no cyanosis or clubbing  Neuro: alert, awake, non focal  Skin: Warm, no lesions or rash      Assessment & Plan:  Chronic cough With evidence for mild bronchiectatic change on CT scan of the chest.  She has overall normal airflows by PFT although curve of her flow volume loop could suggest mild obstruction.  Her cough is clinically more upper airway in nature.  She is on fluticasone, has not been taking her loratadine.  I'd like to add this back and also possibly nasal saline.  She uses Pepcid as needed.  We can evaluate the bronchiectasis further, try to get mycobacterial cultures.  I would not pursue bronchoscopy at this time but we could consider going forward.  Bronchiectasis without acute exacerbation ([place]) Newly identified by CT chest October.  Some subtle evidence for mycobacterial disease.  She does not produce a significant amount of sputum but we may be able to get a culture.  Will defer bronchoscopy for now.  Okay to treat empirically with azithromycin to see if she gets benefit.  She did not have any change in her cough after empiric doxycycline.  I suspect that her day-to-day  symptoms are being driven by upper airway disease.  Baltazar Apo, MD, PhD 07/14/2019, 12:43 PM [facility] [phone] or if no answer [phone]

## 2019-07-14 NOTE — Addendum Note (Signed)
Addended by: Parke Poisson E on: 07/14/2019 01:46 PM   Modules accepted: Orders

## 2019-07-14 NOTE — Assessment & Plan Note (Signed)
With evidence for mild bronchiectatic change on CT scan of the chest.  She has overall normal airflows by PFT although curve of her flow volume loop could suggest mild obstruction.  Her cough is clinically more upper airway in nature.  She is on fluticasone, has not been taking her loratadine.  I'd like to add this back and also possibly nasal saline.  She uses Pepcid as needed.  We can evaluate the bronchiectasis further, try to get mycobacterial cultures.  I would not pursue bronchoscopy at this time but we could consider going forward.

## 2019-07-14 NOTE — Assessment & Plan Note (Signed)
Newly identified by CT chest October.  Some subtle evidence for mycobacterial disease.  She does not produce a significant amount of sputum but we may be able to get a culture.  Will defer bronchoscopy for now.  Okay to treat empirically with azithromycin to see if she gets benefit.  She did not have any change in her cough after empiric doxycycline.  I suspect that her day-to-day symptoms are being driven by upper airway disease.

## 2019-07-14 NOTE — Patient Instructions (Addendum)
Your CT scan of the chest showed some evidence for mild airways inflammation called bronchiectasis.  This can sometimes come from being colonized with Mycobacterium. We will check a sputum sample if you are able to produce to evaluate for either bacterial or mycobacterial organisms Please take azithromycin as directed until completely gone. Please continue your fluticasone (Flonase) nasal spray, 2 sprays each nostril once daily. Please restart your loratadine (Claritin) 10 mg once daily. Continue Pepcid as needed Follow with Dr Lamonte Sakai in 3 months or sooner if you have any problems.

## 2019-07-14 NOTE — Progress Notes (Signed)
PFT done today. 

## 2019-07-28 ENCOUNTER — Encounter: Payer: Self-pay | Admitting: Family Medicine

## 2019-07-28 ENCOUNTER — Other Ambulatory Visit: Payer: Self-pay

## 2019-07-28 ENCOUNTER — Ambulatory Visit (INDEPENDENT_AMBULATORY_CARE_PROVIDER_SITE_OTHER): Payer: Medicare Other | Admitting: Family Medicine

## 2019-07-28 VITALS — BP 150/70 | HR 62 | Temp 97.3°F | Ht 64.0 in | Wt 122.8 lb

## 2019-07-28 DIAGNOSIS — R600 Localized edema: Secondary | ICD-10-CM

## 2019-07-28 DIAGNOSIS — L570 Actinic keratosis: Secondary | ICD-10-CM

## 2019-07-28 DIAGNOSIS — I10 Essential (primary) hypertension: Secondary | ICD-10-CM | POA: Diagnosis not present

## 2019-07-28 LAB — CBC WITH DIFFERENTIAL/PLATELET
Basophils Absolute: 0 10*3/uL (ref 0.0–0.1)
Basophils Relative: 0.5 % (ref 0.0–3.0)
Eosinophils Absolute: 0.2 10*3/uL (ref 0.0–0.7)
Eosinophils Relative: 2.7 % (ref 0.0–5.0)
HCT: 39.3 % (ref 36.0–46.0)
Hemoglobin: 13 g/dL (ref 12.0–15.0)
Lymphocytes Relative: 22.3 % (ref 12.0–46.0)
Lymphs Abs: 1.7 10*3/uL (ref 0.7–4.0)
MCHC: 33.1 g/dL (ref 30.0–36.0)
MCV: 95.6 fl (ref 78.0–100.0)
Monocytes Absolute: 0.5 10*3/uL (ref 0.1–1.0)
Monocytes Relative: 7.3 % (ref 3.0–12.0)
Neutro Abs: 5 10*3/uL (ref 1.4–7.7)
Neutrophils Relative %: 67.2 % (ref 43.0–77.0)
Platelets: 216 10*3/uL (ref 150.0–400.0)
RBC: 4.11 Mil/uL (ref 3.87–5.11)
RDW: 13.8 % (ref 11.5–15.5)
WBC: 7.5 10*3/uL (ref 4.0–10.5)

## 2019-07-28 LAB — COMPREHENSIVE METABOLIC PANEL
ALT: 18 U/L (ref 0–35)
AST: 24 U/L (ref 0–37)
Albumin: 4.2 g/dL (ref 3.5–5.2)
Alkaline Phosphatase: 90 U/L (ref 39–117)
BUN: 11 mg/dL (ref 6–23)
CO2: 28 mEq/L (ref 19–32)
Calcium: 10.6 mg/dL — ABNORMAL HIGH (ref 8.4–10.5)
Chloride: 102 mEq/L (ref 96–112)
Creatinine, Ser: 0.63 mg/dL (ref 0.40–1.20)
GFR: 88.11 mL/min (ref >60.00)
Glucose, Bld: 84 mg/dL (ref 70–99)
Potassium: 4.5 mEq/L (ref 3.5–5.1)
Sodium: 138 mEq/L (ref 135–145)
Total Bilirubin: 0.4 mg/dL (ref 0.2–1.2)
Total Protein: 6.8 g/dL (ref 6.0–8.3)

## 2019-07-28 LAB — MICROALBUMIN / CREATININE URINE RATIO
Creatinine,U: 31.1 mg/dL
Microalb Creat Ratio: 3 mg/g (ref 0.0–30.0)
Microalb, Ur: 0.9 mg/dL (ref 0.0–1.9)

## 2019-07-28 LAB — BRAIN NATRIURETIC PEPTIDE: Pro B Natriuretic peptide (BNP): 253 pg/mL — ABNORMAL HIGH (ref 0.0–100.0)

## 2019-07-28 MED ORDER — TRIAMCINOLONE ACETONIDE 0.1 % EX CREA: 1.0000 "application " | TOPICAL_CREAM | Freq: Two times a day (BID) | CUTANEOUS | 1 refills | Status: DC

## 2019-07-28 NOTE — Progress Notes (Signed)
Patient: Linda [name] MRN: [id number] DOB: 06/12/1924 PCP: Orland Mustard, MD     Subjective:  Chief Complaint  Patient presents with  . Transitions Of Care    HPI: The patient is a 85 y.o. female who presents today for transition of care.   Hypertension: Here for follow up of hypertension.  Currently on no medication. Takes medication as prescribed and denies any side effects. Exercise includes none. Weight has been stable. Denies any chest pain, headaches, shortness of breath, vision changes, swelling in lower extremities.   Swelling of bilateral legs. Chronic issue. Since she fell and crushed some discs in her [name]. She denies any orthopnea, increased shortness of breath or cough from her baseline.     Lesions on face: she has a few spots on her face that have been tehre a long time. Sometimes they itch her. None have bled, they may have grown only slightly.   Review of Systems  Constitutional: Negative for fatigue.  Eyes: Negative for visual disturbance.  Respiratory: Negative for shortness of breath.   Cardiovascular: Positive for leg swelling. Negative for chest pain and palpitations.       C/o swelling in b/l lower legs  Gastrointestinal: Negative for abdominal pain, constipation, diarrhea, nausea and vomiting.  Endocrine: Negative for cold intolerance and heat intolerance.  Skin: Negative for rash.  Neurological: Negative for dizziness and headaches.  Psychiatric/Behavioral: Negative for sleep disturbance.    Allergies Patient has No Known Allergies.  Past Medical History Patient  has a past medical history of Arthritis, Glaucoma, Hyperlipidemia, and Hypertension.  Surgical History Patient  has a past surgical history that includes Appendectomy.  Family History Pateint's family history includes Heart disease in her brother, brother, father, and mother.  Social History Patient  reports that she quit smoking about 69 years ago. Her smoking use included  cigarettes. She started smoking about 71 years ago. She has a 0.50 pack-year smoking history. She has never used smokeless tobacco. She reports previous alcohol use. She reports that she does not use drugs.    Objective: Vitals:   07/28/19 1121  BP: (!) 150/70  Pulse: 62  Temp: (!) 97.3 F (36.3 C)  TempSrc: Skin  SpO2: 97%  Weight: 122 lb 12.8 oz (55.7 kg)  Height: 5\' 4"  (1.626 m)    Body mass index is 21.08 kg/m.  Physical Exam Vitals reviewed.  Constitutional:      Appearance: Normal appearance. She is normal weight.  HENT:     Head: Normocephalic and atraumatic.     Comments: Bilateral hearing aides.  Cardiovascular:     Rate and Rhythm: Normal rate and regular rhythm.  Pulmonary:     Effort: Pulmonary effort is normal.     Breath sounds: Normal breath sounds.  Abdominal:     General: Abdomen is flat. Bowel sounds are normal.     Palpations: Abdomen is soft.  Neurological:     Mental Status: She is alert.  Psychiatric:        Mood and Affect: Mood normal.        Behavior: Behavior normal.    Procedure note: verbal consent obtained. Risks discussed including blistering, pain, infection, scarring. 3 AK on left scalp/temple treated with 2 light cycles of cryotherapy. One Ak on left cheek treated with 2 light cycles of cyro. Wound care given. Patient tolerated well.      Depression screen PHQ 2/9 07/28/2019  Decreased Interest 0  Down, Depressed, Hopeless 0  PHQ - 2 Score 0  Assessment/plan: 1. Essential hypertension To goal for age. On no medication. Will continue to follow every 6 months. Routine lab work today. F/u in 6 months.   - Comprehensive metabolic panel - CBC with Differential/Platelet - Microalbumin / creatinine urine ratio  2. Lower extremity edema Very mild in nature. Likely venous stasis. Checking routine lab work. conservative therapy recommended with leg elevation/compression hose.  - Brain natriuretic peptide  3. Actinic  keratosis Cryotherapy. Wound care given. F/u in 6 months.   Will try and find records... can not find in chart at this time. reviewed chart/problem list/medication. Will try and look further into her chart for records as well.   This visit occurred during the SARS-CoV-2 public health emergency.  Safety protocols were in place, including screening questions prior to the visit, additional usage of staff PPE, and extensive cleaning of exam room while observing appropriate contact time as indicated for disinfecting solutions.     Return in about 6 months (around 01/26/2020) for routine appt. with fasting labs. Orma Flaming, MD [place]   07/28/2019

## 2019-07-28 NOTE — Patient Instructions (Signed)
I have sent in your cream for you. Sent in lower strength so let me know if doesn't work well for you.   -checking routine labs today.  -will look for your records.   -want you to come back in 6 months for fasting labs/appointment.   So nice to meet you! Merry Christmas! Orma Flaming, MD

## 2019-07-30 ENCOUNTER — Other Ambulatory Visit: Payer: Self-pay | Admitting: Family Medicine

## 2019-07-30 DIAGNOSIS — R600 Localized edema: Secondary | ICD-10-CM

## 2019-08-19 ENCOUNTER — Encounter: Payer: Self-pay | Admitting: Family Medicine

## 2019-08-28 ENCOUNTER — Ambulatory Visit: Payer: Medicare Other | Attending: Internal Medicine

## 2019-08-28 DIAGNOSIS — Z23 Encounter for immunization: Secondary | ICD-10-CM | POA: Insufficient documentation

## 2019-08-28 NOTE — Progress Notes (Signed)
   Covid-19 Vaccination Clinic  Name:  Linda [name]    MRN: [id number] DOB: [birth year]  08/28/2019  [name] was observed post Covid-19 immunization for 15 minutes without incidence. She was provided with Vaccine Information Sheet and instruction to access the V-Safe system.   [name] was instructed to call 911 with any severe reactions post vaccine: Marland Kitchen Difficulty breathing  . Swelling of your face and throat  . A fast heartbeat  . A bad rash all over your body  . Dizziness and weakness    Immunizations Administered    Name Date Dose VIS Date Route   Pfizer COVID-19 Vaccine 08/28/2019 10:54 AM 0.3 mL 07/25/2019 Intramuscular   Manufacturer: [employer], [employer]   Lot: [id number]   NDC: [address]-1

## 2019-09-02 ENCOUNTER — Encounter (HOSPITAL_COMMUNITY): Payer: Self-pay | Admitting: Radiology

## 2019-09-03 ENCOUNTER — Encounter: Payer: Self-pay | Admitting: Family Medicine

## 2019-09-04 MED ORDER — CLOBETASOL PROPIONATE 0.05 % EX CREA: 1.0000 "application " | TOPICAL_CREAM | CUTANEOUS | 1 refills | Status: DC | PRN

## 2019-09-04 MED ORDER — FLUTICASONE PROPIONATE 50 MCG/ACT NA SUSP: 1.0000 | Freq: Every day | NASAL | 1 refills | Status: DC | PRN

## 2019-09-04 NOTE — Telephone Encounter (Signed)
Okay to fill medications? 

## 2019-09-04 NOTE — Telephone Encounter (Signed)
Spoke to pt asked her what pharmacy she would she like her Rx's sent to? Pt said [facility]. Told her okay will send both Rx's as requested. Pt verbalized understanding. Rx's sent.

## 2019-09-10 ENCOUNTER — Encounter: Payer: Medicare Other | Admitting: Family Medicine

## 2019-09-17 ENCOUNTER — Ambulatory Visit: Payer: Medicare Other | Admitting: Emergency Medicine

## 2019-09-17 ENCOUNTER — Ambulatory Visit: Payer: Medicare Other | Attending: Internal Medicine

## 2019-09-17 DIAGNOSIS — Z23 Encounter for immunization: Secondary | ICD-10-CM | POA: Insufficient documentation

## 2019-09-17 NOTE — Progress Notes (Signed)
   Covid-19 Vaccination Clinic  Name:  Linda [name]    MRN: [id number] DOB: [birth year]  09/17/2019  [name] was observed post Covid-19 immunization for 15 minutes without incidence. She was provided with Vaccine Information Sheet and instruction to access the V-Safe system.   [name] was instructed to call 911 with any severe reactions post vaccine: Marland Kitchen Difficulty breathing  . Swelling of your face and throat  . A fast heartbeat  . A bad rash all over your body  . Dizziness and weakness    Immunizations Administered    Name Date Dose VIS Date Route   Pfizer COVID-19 Vaccine 09/17/2019 11:07 AM 0.3 mL 07/25/2019 Intramuscular   Manufacturer: [employer], [employer]   Lot: [id number]   NDC: [address]-4

## 2019-09-26 ENCOUNTER — Encounter: Payer: Self-pay | Admitting: Emergency Medicine

## 2019-09-26 ENCOUNTER — Other Ambulatory Visit: Payer: Self-pay

## 2019-09-26 ENCOUNTER — Ambulatory Visit (INDEPENDENT_AMBULATORY_CARE_PROVIDER_SITE_OTHER): Payer: Medicare Other | Admitting: Emergency Medicine

## 2019-09-26 VITALS — BP 116/70 | HR 65 | Temp 97.1°F | Ht 65.0 in | Wt 126.2 lb

## 2019-09-26 DIAGNOSIS — R05 Cough: Secondary | ICD-10-CM | POA: Diagnosis not present

## 2019-09-26 DIAGNOSIS — E041 Nontoxic single thyroid nodule: Secondary | ICD-10-CM

## 2019-09-26 DIAGNOSIS — J479 Bronchiectasis, uncomplicated: Secondary | ICD-10-CM

## 2019-09-26 DIAGNOSIS — R053 Chronic cough: Secondary | ICD-10-CM

## 2019-09-26 NOTE — Patient Instructions (Signed)
We will plan to repeat your CT scan of the chest in October 2021 We will also perform a CT scan of the neck at that time given your thyroid nodules and the sensation of a lump in the back of your throat Please increase your Pepcid to 20 mg twice a day every day Continue your fluticasone nasal spray once daily as you have been taking it Restart your loratadine 10 mg once daily. Follow with Dr Delton Coombes in October after the CT scan to review the results together.

## 2019-09-26 NOTE — Assessment & Plan Note (Signed)
She complains of a lump in her throat which I believe is a globus sensation.  She has a history of thyroid nodules with some rightward deviation of her esophagus and trachea but these are more distal and should not be impacting her upper airway.  She does have rhinitis, uses fluticasone nasal spray reliably and loratadine sporadically.  She also uses Pepcid most days.  We talked today about possible ENT evaluation or even bronchoscopy to visualize her upper airway and posterior pharynx.  At this time we agreed to try and more aggressively treat her potential irritating contributors, defer bronchoscopy or ENT evaluation.  I will check a CT scan of the neck when it is time for her next CT scan of the chest to ensure no anatomical abnormality.  Continue fluticasone nasal spray, add back loratadine every day, increase her Pepcid to twice a day.

## 2019-09-26 NOTE — Progress Notes (Signed)
Subjective:    Patient ID: Linda [name], female    DOB: 01/02/1924, 86 y.o.   MRN: [id number]  HPI  ROV 07/14/2019 --[name] is 89, follows up today for chronic cough.  She has continued to have symptoms, produces some colored mucus, occasional difficulty managing saliva.  She has been maintained on fluticasone nasal spray, loratadine, has used Pepcid as needed.  We performed a CT scan of her chest on 10/14 that I have reviewed, shows mild diffuse bronchiectatic change with some peribronchovascular nodularity, suspicious for possible MAIC. PFTs were done today and I have reviewed, shows overall normal lung function with some curve to her flow volume loop that could suggest mild obstruction.  Her lung volumes are normal.  Diffusion capacity was not done.  She continues to have cough, mostly UA in nature. Still has thick saliva and mucous, clear. No significant SOB. Rarely she will bring up some purulent sputum from deeper. No fevers, chills, constitutional sx.   ROV 09/26/19 --pleasant 86 year old woman with a history of chronic cough.  Much of this has been upper airway in nature and we have treated with fluticasone nasal spray, loratadine, Pepcid as needed to manage contributors. She is no longer on the loratadine, uses pepcid prn - almost every day.  She has fairly newly identified bronchiectatic change with some peribronchovascular nodularity consistent with MAIC colonization, also some possible mild obstruction on spirometry based on curve of her flow volume loop.  I treated her with a course of azithromycin in November to see if this would help with her cough burden.  She reports today that her mucous production improved, but that she has dry cough and a feeling of a "lump in her throat".  She denies overt GERD.    Review of Systems  Respiratory: Positive for cough.     Past Medical History:  Diagnosis Date  . Arthritis   . Glaucoma   . Hyperlipidemia   . Hypertension      Family  History  Problem Relation Age of Onset  . Heart disease Mother   . Heart disease Father   . Heart disease Brother   . Heart disease Brother      Social History   Socioeconomic History  . Marital status: Widowed    Spouse name: Not on file  . Number of children: Not on file  . Years of education: Not on file  . Highest education level: Not on file  Occupational History  . Occupation: Retired  Tobacco Use  . Smoking status: Former Smoker    Packs/day: 0.25    Years: 2.00    Pack years: 0.50    Types: Cigarettes    Start date: 26    Quit date: 1952    Years since quitting: 69.1  . Smokeless tobacco: Never Used  Substance and Sexual Activity  . Alcohol use: Not Currently  . Drug use: Never  . Sexual activity: Not on file  Other Topics Concern  . Not on file  Social History Narrative  . Not on file   Social Determinants of Health   Financial Resource Strain:   . Difficulty of Paying Living Expenses: Not on file  Food Insecurity:   . Worried About Charity fundraiser in the Last Year: Not on file  . Ran Out of Food in the Last Year: Not on file  Transportation Needs:   . Lack of Transportation (Medical): Not on file  . Lack of Transportation (Non-Medical): Not on file  Physical Activity:   . Days of Exercise per Week: Not on file  . Minutes of Exercise per Session: Not on file  Stress:   . Feeling of Stress : Not on file  Social Connections:   . Frequency of Communication with Friends and Family: Not on file  . Frequency of Social Gatherings with Friends and Family: Not on file  . Attends Religious Services: Not on file  . Active Member of Clubs or Organizations: Not on file  . Attends Banker Meetings: Not on file  . Marital Status: Not on file  Intimate Partner Violence:   . Fear of Current or Ex-Partner: Not on file  . Emotionally Abused: Not on file  . Physically Abused: Not on file  . Sexually Abused: Not on file     No Known Allergies     Outpatient Medications Prior to Visit  Medication Sig Dispense Refill  . acetaminophen (TYLENOL) 325 MG tablet Take 650 mg by mouth every 6 (six) hours as needed.    Marland Kitchen aspirin EC 81 MG tablet Take 81 mg by mouth daily.    . Cholecalciferol (VITAMIN D3) 25 MCG (1000 UT) CAPS Take 1 capsule by mouth daily.    . ciclopirox (LOPROX) 0.77 % cream Apply topically 2 (two) times daily.    . clobetasol cream (TEMOVATE) 0.05 % Apply 1 application topically as needed. 60 g 1  . famotidine (PEPCID) 20 MG tablet Take 1 tablet (20 mg total) by mouth as needed for heartburn or indigestion. 90 tablet 1  . fluticasone (FLONASE) 50 MCG/ACT nasal spray Place 1 spray into both nostrils daily as needed for allergies or rhinitis. 48 g 1  . Multiple Vitamin (MULTIVITAMIN) tablet Take 1 tablet by mouth daily.    . rosuvastatin (CRESTOR) 20 MG tablet TAKE 1 TABLET DAILY 90 tablet 3  . sotalol (BETAPACE) 80 MG tablet Take 1 tablet (80 mg total) by mouth every 12 (twelve) hours. 180 tablet 1  . timolol (TIMOPTIC) 0.25 % ophthalmic solution     . triamcinolone cream (KENALOG) 0.1 % Apply 1 application topically 2 (two) times daily. 30 g 1  . vitamin k 100 MCG tablet Take 100 mcg by mouth daily.    Marland Kitchen FLUAD 0.5 ML SUSY      No facility-administered medications prior to visit.          Objective:   Physical Exam Vitals:   09/26/19 1133  BP: 116/70  Pulse: 65  Temp: (!) 97.1 F (36.2 C)  TempSrc: Temporal  SpO2: 97%  Weight: 126 lb 3.2 oz (57.2 kg)  Height: 5\' 5"  (1.651 m)   Gen: Pleasant, thin elderly woman, in no distress,  normal affect  ENT: No lesions,  mouth clear,  oropharynx clear, no postnasal drip  Neck: No JVD, no stridor  Lungs: No use of accessory muscles, no crackles or wheezing on normal respiration, no wheeze on forced expiration  Cardiovascular: RRR, heart sounds normal, no murmur or gallops, no peripheral edema  Musculoskeletal: No deformities, no cyanosis or clubbing  Neuro:  alert, awake, non focal  Skin: Warm, no lesions or rash      Assessment & Plan:  Chronic cough She complains of a lump in her throat which I believe is a globus sensation.  She has a history of thyroid nodules with some rightward deviation of her esophagus and trachea but these are more distal and should not be impacting her upper airway.  She does have rhinitis, uses  fluticasone nasal spray reliably and loratadine sporadically.  She also uses Pepcid most days.  We talked today about possible ENT evaluation or even bronchoscopy to visualize her upper airway and posterior pharynx.  At this time we agreed to try and more aggressively treat her potential irritating contributors, defer bronchoscopy or ENT evaluation.  I will check a CT scan of the neck when it is time for her next CT scan of the chest to ensure no anatomical abnormality.  Continue fluticasone nasal spray, add back loratadine every day, increase her Pepcid to twice a day.  Bronchiectasis without acute exacerbation ([facility]) Still no clear evidence for lower airways component to her cough.  She is not creating mucus, does not require flutter valve or any pulmonary hygiene.  CT chest has been stable.  She is due for repeat scan in October 2021.  Levy Pupa, MD, PhD 09/26/2019, 1:31 PM [facility] [phone] or if no answer [phone]

## 2019-09-26 NOTE — Assessment & Plan Note (Signed)
Still no clear evidence for lower airways component to her cough.  She is not creating mucus, does not require flutter valve or any pulmonary hygiene.  CT chest has been stable.  She is due for repeat scan in October 2021.

## 2019-09-30 ENCOUNTER — Ambulatory Visit (HOSPITAL_COMMUNITY): Payer: Medicare Other | Attending: Cardiovascular Disease

## 2019-09-30 ENCOUNTER — Other Ambulatory Visit: Payer: Self-pay

## 2019-09-30 DIAGNOSIS — R600 Localized edema: Secondary | ICD-10-CM | POA: Diagnosis not present

## 2019-11-03 ENCOUNTER — Other Ambulatory Visit: Payer: Self-pay

## 2019-11-03 ENCOUNTER — Encounter: Payer: Self-pay | Admitting: Family Medicine

## 2019-11-03 DIAGNOSIS — K219 Gastro-esophageal reflux disease without esophagitis: Secondary | ICD-10-CM

## 2019-11-03 MED ORDER — FAMOTIDINE 20 MG PO TABS: 20.0000 mg | ORAL_TABLET | Freq: Two times a day (BID) | ORAL | 1 refills | Status: DC

## 2019-11-04 ENCOUNTER — Telehealth: Payer: Self-pay | Admitting: Family Medicine

## 2019-11-04 NOTE — Telephone Encounter (Signed)
Left message for patient to call back and schedule Medicare Annual Wellness Visit (AWV) either virtually/audio only OR in office. Whatever the patients preference is.  No hx; please schedule at anytime with [facility]Nurse Health Advisor at [facility].   

## 2019-11-07 ENCOUNTER — Encounter: Payer: Self-pay | Admitting: Family Medicine

## 2019-11-19 ENCOUNTER — Other Ambulatory Visit: Payer: Self-pay | Admitting: Family Medicine

## 2019-11-22 ENCOUNTER — Encounter: Payer: Self-pay | Admitting: Family Medicine

## 2019-11-22 DIAGNOSIS — I471 Supraventricular tachycardia: Secondary | ICD-10-CM

## 2019-11-22 DIAGNOSIS — F341 Dysthymic disorder: Secondary | ICD-10-CM

## 2019-11-24 MED ORDER — SOTALOL HCL 80 MG PO TABS: 80.0000 mg | ORAL_TABLET | Freq: Two times a day (BID) | ORAL | 1 refills | Status: DC

## 2019-12-08 ENCOUNTER — Encounter: Payer: Self-pay | Admitting: Family Medicine

## 2019-12-08 NOTE — Telephone Encounter (Signed)
Please schedule app with pt. ° ° °Thank You °

## 2019-12-25 ENCOUNTER — Ambulatory Visit (INDEPENDENT_AMBULATORY_CARE_PROVIDER_SITE_OTHER): Payer: Medicare Other

## 2019-12-25 ENCOUNTER — Encounter: Payer: Self-pay | Admitting: Family Medicine

## 2019-12-25 ENCOUNTER — Other Ambulatory Visit: Payer: Self-pay

## 2019-12-25 ENCOUNTER — Ambulatory Visit (INDEPENDENT_AMBULATORY_CARE_PROVIDER_SITE_OTHER): Payer: Medicare Other | Admitting: Family Medicine

## 2019-12-25 VITALS — BP 118/70 | HR 78 | Temp 97.8°F | Ht 65.0 in | Wt 125.6 lb

## 2019-12-25 VITALS — BP 118/70 | HR 78 | Temp 97.8°F | Ht 65.0 in | Wt 125.7 lb

## 2019-12-25 DIAGNOSIS — Z0000 Encounter for general adult medical examination without abnormal findings: Secondary | ICD-10-CM

## 2019-12-25 DIAGNOSIS — H6122 Impacted cerumen, left ear: Secondary | ICD-10-CM

## 2019-12-25 DIAGNOSIS — G8929 Other chronic pain: Secondary | ICD-10-CM | POA: Diagnosis not present

## 2019-12-25 DIAGNOSIS — R6889 Other general symptoms and signs: Secondary | ICD-10-CM

## 2019-12-25 DIAGNOSIS — D509 Iron deficiency anemia, unspecified: Secondary | ICD-10-CM | POA: Diagnosis not present

## 2019-12-25 LAB — CBC WITH DIFFERENTIAL/PLATELET
Basophils Absolute: 0 10*3/uL (ref 0.0–0.1)
Basophils Relative: 0.4 % (ref 0.0–3.0)
Eosinophils Absolute: 0.1 10*3/uL (ref 0.0–0.7)
Eosinophils Relative: 1.8 % (ref 0.0–5.0)
HCT: 38.2 % (ref 36.0–46.0)
Hemoglobin: 12.9 g/dL (ref 12.0–15.0)
Lymphocytes Relative: 19.6 % (ref 12.0–46.0)
Lymphs Abs: 1.5 10*3/uL (ref 0.7–4.0)
MCHC: 33.6 g/dL (ref 30.0–36.0)
MCV: 95.4 fl (ref 78.0–100.0)
Monocytes Absolute: 0.7 10*3/uL (ref 0.1–1.0)
Monocytes Relative: 9.3 % (ref 3.0–12.0)
Neutro Abs: 5.2 10*3/uL (ref 1.4–7.7)
Neutrophils Relative %: 68.9 % (ref 43.0–77.0)
Platelets: 225 10*3/uL (ref 150.0–400.0)
RBC: 4.01 Mil/uL (ref 3.87–5.11)
RDW: 13.3 % (ref 11.5–15.5)
WBC: 7.5 10*3/uL (ref 4.0–10.5)

## 2019-12-25 LAB — COMPREHENSIVE METABOLIC PANEL
ALT: 14 U/L (ref 0–35)
AST: 24 U/L (ref 0–37)
Albumin: 4.1 g/dL (ref 3.5–5.2)
Alkaline Phosphatase: 86 U/L (ref 39–117)
BUN: 12 mg/dL (ref 6–23)
CO2: 29 mEq/L (ref 19–32)
Calcium: 10.2 mg/dL (ref 8.4–10.5)
Chloride: 102 mEq/L (ref 96–112)
Creatinine, Ser: 0.65 mg/dL (ref 0.40–1.20)
GFR: 84.92 mL/min (ref >60.00)
Glucose, Bld: 94 mg/dL (ref 70–99)
Potassium: 4.2 mEq/L (ref 3.5–5.1)
Sodium: 137 mEq/L (ref 135–145)
Total Bilirubin: 0.4 mg/dL (ref 0.2–1.2)
Total Protein: 6.6 g/dL (ref 6.0–8.3)

## 2019-12-25 LAB — TSH: TSH: 0.62 u[IU]/mL (ref 0.35–4.50)

## 2019-12-25 MED ORDER — TRAMADOL HCL 50 MG PO TABS: 50.0000 mg | ORAL_TABLET | Freq: Every day | ORAL | 0 refills | Status: DC | PRN

## 2019-12-25 NOTE — Patient Instructions (Signed)
Linda [name] , Thank you for taking time to come for your Medicare Wellness Visit. I appreciate your ongoing commitment to your health goals. Please review the following plan we discussed and let me know if I can assist you in the future.   Screening recommendations/referrals: Colorectal Screening: No longer indicated  Mammogram: No longer indicated  Bone Density: No longer indicated   Vision and Dental Exams: Recommended annual ophthalmology exams for early detection of glaucoma and other disorders of the eye Recommended annual dental exams for proper oral hygiene  Vaccinations: Influenza vaccine: up to date; last 05/15/19 Pneumococcal vaccine: Prevnar recommended  Tdap vaccine: recommended every 10 years; Please call your insurance company to determine your out of pocket expense. You also receive this vaccine at your local pharmacy or Health Dept. Shingles vaccine:  You may receive this vaccine at your local pharmacy. (see handout)  Covid vaccine: completed   Advanced directives: Please bring a copy of your POA (Power of Attorney) and/or Living Will to your next appointment.  Goals: Recommend to drink at least 6-8 8oz glasses of water per day and consume a balanced diet rich in fresh fruits and vegetables.   Next appointment: Please schedule your Annual Wellness Visit with your Nurse Health Advisor in one year.  Preventive Care 82 Years and Older, Female Preventive care refers to lifestyle choices and visits with your health care provider that can promote health and wellness. What does preventive care include?  A yearly physical exam. This is also called an annual well check.  Dental exams once or twice a year.  Routine eye exams. Ask your health care provider how often you should have your eyes checked.  Personal lifestyle choices, including:  Daily care of your teeth and gums.  Regular physical activity.  Eating a healthy diet.  Avoiding tobacco and drug use.  Limiting  alcohol use.  Practicing safe sex.  Taking low-dose aspirin every day if recommended by your health care provider.  Taking vitamin and mineral supplements as recommended by your health care provider. What happens during an annual well check? The services and screenings done by your health care provider during your annual well check will depend on your age, overall health, lifestyle risk factors, and family history of disease. Counseling  Your health care provider may ask you questions about your:  Alcohol use.  Tobacco use.  Drug use.  Emotional well-being.  Home and relationship well-being.  Sexual activity.  Eating habits.  History of falls.  Memory and ability to understand (cognition).  Work and work Astronomer.  Reproductive health. Screening  You may have the following tests or measurements:  Height, weight, and BMI.  Blood pressure.  Lipid and cholesterol levels. These may be checked every 5 years, or more frequently if you are over 8 years old.  Skin check.  Lung cancer screening. You may have this screening every year starting at age 74 if you have a 30-pack-year history of smoking and currently smoke or have quit within the past 15 years.  Fecal occult blood test (FOBT) of the stool. You may have this test every year starting at age 30.  Flexible sigmoidoscopy or colonoscopy. You may have a sigmoidoscopy every 5 years or a colonoscopy every 10 years starting at age 81.  Hepatitis C blood test.  Hepatitis B blood test.  Sexually transmitted disease (STD) testing.  Diabetes screening. This is done by checking your blood sugar (glucose) after you have not eaten for a while (fasting). You  may have this done every 1-3 years.  Bone density scan. This is done to screen for osteoporosis. You may have this done starting at age 68.  Mammogram. This may be done every 1-2 years. Talk to your health care provider about how often you should have regular  mammograms. Talk with your health care provider about your test results, treatment options, and if necessary, the need for more tests. Vaccines  Your health care provider may recommend certain vaccines, such as:  Influenza vaccine. This is recommended every year.  Tetanus, diphtheria, and acellular pertussis (Tdap, Td) vaccine. You may need a Td booster every 10 years.  Zoster vaccine. You may need this after age 32.  Pneumococcal 13-valent conjugate (PCV13) vaccine. One dose is recommended after age 60.  Pneumococcal polysaccharide (PPSV23) vaccine. One dose is recommended after age 2. Talk to your health care provider about which screenings and vaccines you need and how often you need them. This information is not intended to replace advice given to you by your health care provider. Make sure you discuss any questions you have with your health care provider. Document Released: 08/27/2015 Document Revised: 04/19/2016 Document Reviewed: 06/01/2015 Elsevier Interactive Patient Education  2017 [place] Prevention in the Home Falls can cause injuries. They can happen to people of all ages. There are many things you can do to make your home safe and to help prevent falls. What can I do on the outside of my home?  Regularly fix the edges of walkways and driveways and fix any cracks.  Remove anything that might make you trip as you walk through a door, such as a raised step or threshold.  Trim any bushes or trees on the path to your home.  Use bright outdoor lighting.  Clear any walking paths of anything that might make someone trip, such as rocks or tools.  Regularly check to see if handrails are loose or broken. Make sure that both sides of any steps have handrails.  Any raised decks and porches should have guardrails on the edges.  Have any leaves, snow, or ice cleared regularly.  Use sand or salt on walking paths during winter.  Clean up any spills in your garage  right away. This includes oil or grease spills. What can I do in the bathroom?  Use night lights.  Install grab bars by the toilet and in the tub and shower. Do not use towel bars as grab bars.  Use non-skid mats or decals in the tub or shower.  If you need to sit down in the shower, use a plastic, non-slip stool.  Keep the floor dry. Clean up any water that spills on the floor as soon as it happens.  Remove soap buildup in the tub or shower regularly.  Attach bath mats securely with double-sided non-slip rug tape.  Do not have throw rugs and other things on the floor that can make you trip. What can I do in the bedroom?  Use night lights.  Make sure that you have a light by your bed that is easy to reach.  Do not use any sheets or blankets that are too big for your bed. They should not hang down onto the floor.  Have a firm chair that has side arms. You can use this for support while you get dressed.  Do not have throw rugs and other things on the floor that can make you trip. What can I do in the kitchen?  Clean  up any spills right away.  Avoid walking on wet floors.  Keep items that you use a lot in easy-to-reach places.  If you need to reach something above you, use a strong step stool that has a grab bar.  Keep electrical cords out of the way.  Do not use floor polish or wax that makes floors slippery. If you must use wax, use non-skid floor wax.  Do not have throw rugs and other things on the floor that can make you trip. What can I do with my stairs?  Do not leave any items on the stairs.  Make sure that there are handrails on both sides of the stairs and use them. Fix handrails that are broken or loose. Make sure that handrails are as long as the stairways.  Check any carpeting to make sure that it is firmly attached to the stairs. Fix any carpet that is loose or worn.  Avoid having throw rugs at the top or bottom of the stairs. If you do have throw rugs,  attach them to the floor with carpet tape.  Make sure that you have a light switch at the top of the stairs and the bottom of the stairs. If you do not have them, ask someone to add them for you. What else can I do to help prevent falls?  Wear shoes that:  Do not have high heels.  Have rubber bottoms.  Are comfortable and fit you well.  Are closed at the toe. Do not wear sandals.  If you use a stepladder:  Make sure that it is fully opened. Do not climb a closed stepladder.  Make sure that both sides of the stepladder are locked into place.  Ask someone to hold it for you, if possible.  Clearly mark and make sure that you can see:  Any grab bars or handrails.  First and last steps.  Where the edge of each step is.  Use tools that help you move around (mobility aids) if they are needed. These include:  Canes.  Walkers.  Scooters.  Crutches.  Turn on the lights when you go into a dark area. Replace any light bulbs as soon as they burn out.  Set up your furniture so you have a clear path. Avoid moving your furniture around.  If any of your floors are uneven, fix them.  If there are any pets around you, be aware of where they are.  Review your medicines with your doctor. Some medicines can make you feel dizzy. This can increase your chance of falling. Ask your doctor what other things that you can do to help prevent falls. This information is not intended to replace advice given to you by your health care provider. Make sure you discuss any questions you have with your health care provider. Document Released: 05/27/2009 Document Revised: 01/06/2016 Document Reviewed: 09/04/2014 Elsevier Interactive Patient Education  2017 [employer].

## 2019-12-25 NOTE — Patient Instructions (Addendum)
For pain.Marland KitchenMarland Kitchen  1) first I want you to get over the counter voltaren gel for your knees. Can rub on your knees four times a day for pain. It's a topical NSAID, like motrin/aleve/etc. But it won't hurt your stomach or kidneys.   2) can continue tylenol, but again, do NOT go over 3000mg /day.   3) for severe pain or breakthrough pain we will do tramadol. This is an opioid. We will start with one/day if needed. Please be cautious with drowsiness/falls/etc.   4) checking a few labs for being cold, but I do not think this is something pathological and common as we age.   5) see you back in 6 months for routine check up and fasting labs.   Good to see you!  Dr. 

## 2019-12-25 NOTE — Progress Notes (Signed)
Subjective:   Linda [name] is a 86 y.o. female who presents for an Initial Medicare Annual Wellness Visit.  Review of Systems    Cardiac Risk Factors include: advanced age (>39men, >45 women);dyslipidemia    Objective:    Today's Vitals   12/25/19 1019  BP: 118/70  Pulse: 78  Temp: 97.8 F (36.6 C)  TempSrc: Temporal  SpO2: 94%  Weight: 125 lb 10.6 oz (57 kg)  Height: 5\' 5"  (1.651 m)   Body mass index is 20.91 kg/m.  Advanced Directives 12/25/2019  Does Patient Have a Medical Advance Directive? Yes  Type of Advance Directive Living will;Healthcare Power of Attorney  Does patient want to make changes to medical advance directive? No - Patient declined  Copy of Healthcare Power of Attorney in Chart? No - copy requested    Current Medications (verified) Outpatient Encounter Medications as of 12/25/2019  Medication Sig  . acetaminophen (TYLENOL) 325 MG tablet Take 650 mg by mouth every 6 (six) hours as needed.  12/27/2019 aspirin EC 81 MG tablet Take 81 mg by mouth daily.  . Cholecalciferol (VITAMIN D3) 25 MCG (1000 UT) CAPS Take 1 capsule by mouth daily.  . ciclopirox (LOPROX) 0.77 % cream Apply topically 2 (two) times daily.  . clobetasol cream (TEMOVATE) 0.05 % Apply 1 application topically as needed.  . famotidine (PEPCID) 20 MG tablet Take 1 tablet (20 mg total) by mouth 2 (two) times daily.  Marland Kitchen FLUAD 0.5 ML SUSY   . fluticasone (FLONASE) 50 MCG/ACT nasal spray Place 1 spray into both nostrils daily as needed for allergies or rhinitis.  . Multiple Vitamin (MULTIVITAMIN) tablet Take 1 tablet by mouth daily.  . rosuvastatin (CRESTOR) 20 MG tablet TAKE 1 TABLET DAILY  . sotalol (BETAPACE) 80 MG tablet Take 1 tablet (80 mg total) by mouth every 12 (twelve) hours.  . timolol (TIMOPTIC) 0.25 % ophthalmic solution   . triamcinolone cream (KENALOG) 0.1 % Apply 1 application topically 2 (two) times daily. (Patient not taking: Reported on 12/25/2019)   No facility-administered  encounter medications on file as of 12/25/2019.    Allergies (verified) Patient has no known allergies.   History: Past Medical History:  Diagnosis Date  . Arthritis   . Glaucoma   . Hyperlipidemia   . Hypertension    Past Surgical History:  Procedure Laterality Date  . APPENDECTOMY     Family History  Problem Relation Age of Onset  . Heart disease Mother   . Heart disease Father   . Heart disease Brother   . Heart disease Brother    Social History   Socioeconomic History  . Marital status: Widowed    Spouse name: Not on file  . Number of children: Not on file  . Years of education: Not on file  . Highest education level: Not on file  Occupational History  . Occupation: Retired  Tobacco Use  . Smoking status: Former Smoker    Packs/day: 0.25    Years: 2.00    Pack years: 0.50    Types: Cigarettes    Start date: 3    Quit date: 1952    Years since quitting: 69.4  . Smokeless tobacco: Never Used  Substance and Sexual Activity  . Alcohol use: Not Currently  . Drug use: Never  . Sexual activity: Not on file  Other Topics Concern  . Not on file  Social History Narrative   Resident at 89    Enjoys playing cards  Social Determinants of Health   Financial Resource Strain:   . Difficulty of Paying Living Expenses:   Food Insecurity:   . Worried About Charity fundraiser in the Last Year:   . Arboriculturist in the Last Year:   Transportation Needs:   . Film/video editor (Medical):   Marland Kitchen Lack of Transportation (Non-Medical):   Physical Activity:   . Days of Exercise per Week:   . Minutes of Exercise per Session:   Stress:   . Feeling of Stress :   Social Connections:   . Frequency of Communication with Friends and Family:   . Frequency of Social Gatherings with Friends and Family:   . Attends Religious Services:   . Active Member of Clubs or Organizations:   . Attends Archivist Meetings:   Marland Kitchen Marital Status:      Tobacco Counseling Counseling given: Not Answered   Clinical Intake:  Pre-visit preparation completed: Yes  Pain : No/denies pain  Diabetes: No  How often do you need to have someone help you when you read instructions, pamphlets, or other written materials from your doctor or pharmacy?: 1 - Never  Interpreter Needed?: No  Information entered by :: Denman George LPN   Activities of Daily Living In your present state of health, do you have any difficulty performing the following activities: 12/25/2019 03/14/2019  Hearing? Tempie Donning  Vision? N Y  Difficulty concentrating or making decisions? N Y  Walking or climbing stairs? Y Y  Dressing or bathing? N N  Doing errands, shopping? Tempie Donning  Preparing Food and eating ? N -  Using the Toilet? N -  In the past six months, have you accidently leaked urine? N -  Do you have problems with loss of bowel control? N -  Managing your Medications? N -  Managing your Finances? N -  Housekeeping or managing your Housekeeping? N -  Some recent data might be hidden     Immunizations and Health Maintenance Immunization History  Administered Date(s) Administered  . Influenza, High Dose Seasonal PF 05/15/2019  . Influenza-Unspecified 06/04/2018  . PFIZER SARS-COV-2 Vaccination 08/28/2019, 09/17/2019   There are no preventive care reminders to display for this patient.  Patient Care Team: Orma Flaming, MD as PCP - General (Family Medicine) Collene Gobble, MD as Consulting Physician (Pulmonary Disease) Magnus Sinning, MD as Consulting Physician (Physical Medicine and Rehabilitation) Eunice Blase, MD as Consulting Physician (Family Medicine)  Indicate any recent Medical Services you may have received from other than [facility] providers in the past year (date may be approximate).     Assessment:   This is a routine wellness examination for Linda [name].  Hearing/Vision screen  Hearing Screening   125Hz  250Hz  500Hz  1000Hz  2000Hz  3000Hz   4000Hz  6000Hz  8000Hz   Right ear:           Left ear:           Comments: Bilateral hearing aids    Dietary issues and exercise activities discussed: Current Exercise Habits: The patient does not participate in regular exercise at present  Goals   None    Depression Screen PHQ 2/9 Scores 12/25/2019 12/25/2019 07/28/2019  PHQ - 2 Score 0 0 0    Fall Risk Fall Risk  12/25/2019 12/25/2019  Falls in the past year? 0 0  Number falls in past yr: 0 0  Injury with Fall? 0 0  Risk for fall due to : Impaired balance/gait;Impaired mobility -  Follow  up Falls evaluation completed;Education provided;Falls prevention discussed -    Is the patient's home free of loose throw rugs in walkways, pet beds, electrical cords, etc?   yes      Grab bars in the bathroom? yes      Handrails on the stairs?   yes      Adequate lighting?   yes  Timed Get Up and Go Performed completed and within normal timeframe; no gait abnormalities noted (utilizes walker with ambulation)    Cognitive Function: no cognitive concerns at this time    6CIT Screen 12/25/2019  What Year? 0 points  What month? 0 points  What time? 0 points  Count back from 20 0 points  Months in reverse 0 points  Repeat phrase 2 points  Total Score 2    Screening Tests Health Maintenance  Topic Date Due  . TETANUS/TDAP  12/24/2020 (Originally 03/11/1943)  . PNA vac Low Risk Adult (1 of 2 - PCV13) 12/24/2020 (Originally 03/12/1991)  . INFLUENZA VACCINE  03/14/2020  . COVID-19 Vaccine  Completed  . DEXA SCAN  Discontinued    Qualifies for Shingles Vaccine? Discussed and patient will check with pharmacy for coverage.  Patient education handout provided    Cancer Screenings: Lung: Low Dose CT Chest recommended if Age 18-80 years, 30 pack-year currently smoking OR have quit w/in 15years. Patient does not qualify. Breast: Up to date on Mammogram? Yes   Up to date of Bone Density/Dexa? Yes Colorectal: No longer indicated   Plan:  I  have personally reviewed and addressed the Medicare Annual Wellness questionnaire and have noted the following in the patient's chart:  A. Medical and social history B. Use of alcohol, tobacco or illicit drugs  C. Current medications and supplements D. Functional ability and status E.  Nutritional status F.  Physical activity G. Advance directives H. List of other physicians I.  Hospitalizations, surgeries, and ER visits in previous 12 months J.  Vitals K. Screenings such as hearing and vision if needed, cognitive and depression L. Referrals, records requested, and appointments- none   In addition, I have reviewed and discussed with patient certain preventive protocols, quality metrics, and best practice recommendations. A written personalized care plan for preventive services as well as general preventive health recommendations were provided to patient.   Signed,  Kandis Fantasia, LPN  Nurse Health Advisor   Nurse Notes: no additional

## 2019-12-25 NOTE — Progress Notes (Signed)
Patient: Linda [name] MRN: [id number] DOB: 1924-03-17 PCP: Linda Mustard, MD     Subjective:  Chief Complaint  Patient presents with  . Acute Visit    Patient mentioned that she is always cold. She also mentioned that she would like to discuss other medications she can take besides Tylenol for her bilateral knee pain and tailbone pain.   Marland Kitchen Hearing Loss    Patient's daughter mentioned that she needs to have her ears cleaned.     HPI: The patient is a 86 y.o. female who presents today for possible cerumen impaction. She states she feels like her hearing is muffled in both ears. She does wear hearing aides bilaterally. No pain/drainage and no tinnitus.   She also would like to know if she can take something else besides tylenol. She fell 5 years ago onto her tail bone and continues to have pain. She also states her knees "are going."   Cold feeling She states she is always cold. denies her hands/feet turning white/purple or toes. Her AL is kept at 72 degrees. She is cold natured. She has not lost weight.   Review of Systems  Constitutional: Positive for chills. Negative for fever.       Patient mentioned that she feels cold all the time.   HENT: Positive for hearing loss. Negative for sore throat.   Respiratory: Positive for cough. Negative for shortness of breath and wheezing.        Patient stated that she has a post nasal drip.     Allergies Patient has No Known Allergies.  Past Medical History Patient  has a past medical history of Arthritis, Glaucoma, Hyperlipidemia, and Hypertension.  Surgical History Patient  has a past surgical history that includes Appendectomy.  Family History Pateint's family history includes Heart disease in her brother, brother, father, and mother.  Social History Patient  reports that she quit smoking about 69 years ago. Her smoking use included cigarettes. She started smoking about 71 years ago. She has a 0.50 pack-year smoking history. She  has never used smokeless tobacco. She reports previous alcohol use. She reports that she does not use drugs.    Objective: Vitals:   12/25/19 0955  BP: 118/70  Pulse: 78  Temp: 97.8 F (36.6 C)  TempSrc: Temporal  SpO2: 94%  Weight: 125 lb 9.6 oz (57 kg)  Height: 5\' 5"  (1.651 m)    Body mass index is 20.9 kg/m.  Physical Exam Vitals reviewed.  Constitutional:      Appearance: Normal appearance.  HENT:     Head: Normocephalic and atraumatic.     Comments: Hard of hearing, bilateral HA    Right Ear: Tympanic membrane, ear canal and external ear normal.     Left Ear: External ear normal. There is impacted cerumen.  Cardiovascular:     Rate and Rhythm: Normal rate.  Pulmonary:     Effort: Pulmonary effort is normal.     Breath sounds: Normal breath sounds.  Abdominal:     General: Abdomen is flat. Bowel sounds are normal.     Palpations: Abdomen is soft.  Neurological:     General: No focal deficit present.     Mental Status: She is alert and oriented to person, place, and time.  Psychiatric:        Mood and Affect: Mood normal.        Behavior: Behavior normal.    Ceruminosis is noted.  Patient consent verbally given. Wax is removed by syringing  and manual debridement. Not able to completely clear out wax and remains impacted. instructions for home care to prevent wax buildup are given.     Assessment/plan: 1. Impacted cerumen of left ear Recommend they see ENT for full removal of wax. Daughter will call and set this up.   2. Other chronic pain Will do trial of voltaren gel on her knees qid, continue tylenol with max of 3000mg /day and trial of tramadol daily prn for breakthrough or severe pain. daughter will be with her to make sure she doesn't get too drowsy or dizzy when she takes this. May need this increased, but will try and keep her to as minimal amount as needed.   3. Cold feeling No red flags. Sounds like normal impairment with thermoregulation with age.  Will check basic labs.  - Comprehensive metabolic panel - TSH  4. Iron deficiency anemia, unspecified iron deficiency anemia type  - CBC with Differential/Platelet    This visit occurred during the SARS-CoV-2 public health emergency.  Safety protocols were in place, including screening questions prior to the visit, additional usage of staff PPE, and extensive cleaning of exam room while observing appropriate contact time as indicated for disinfecting solutions.     Return in about 6 months (around 06/26/2020) for routine htn/lipid. fasting labs .   Orma Flaming, MD [place]   12/25/2019

## 2019-12-25 NOTE — Progress Notes (Deleted)
Patient: Linda [name] MRN: [id number] DOB: 01/27/1924 PCP: Orland Mustard, MD     Subjective:  Chief Complaint  Patient presents with  . Annual Exam    Patient mentioned that she is always cold. She also mentioned that she would like to discuss other medications she can take besides Tylenol for her bilateral knee pain and tailbone pain.     HPI: The patient is a 85 y.o. female who presents today for annual exam. {He/she (caps):30048} denies any changes to past medical history. There have been no recent hospitalizations. They {Actions; are/are not:16769} following a well balanced diet and exercise plan. Weight has been {trend:16658}. No complaints today.   Immunization History  Administered Date(s) Administered  . Influenza, High Dose Seasonal PF 05/15/2019  . Influenza-Unspecified 06/04/2018  . PFIZER SARS-COV-2 Vaccination 08/28/2019, 09/17/2019   Colonoscopy: Mammogram:  Pap smear:  PSA:   Review of Systems  Constitutional: Positive for chills. Negative for fever.  HENT: Positive for hearing loss. Negative for sore throat.   Respiratory: Positive for cough.        Patient mentioned that she has a post nasal drip.    Allergies Patient has No Known Allergies.  Past Medical History Patient  has a past medical history of Arthritis, Glaucoma, Hyperlipidemia, and Hypertension.  Surgical History Patient  has a past surgical history that includes Appendectomy.  Family History Pateint's family history includes Heart disease in her brother, brother, father, and mother.  Social History Patient  reports that she quit smoking about 69 years ago. Her smoking use included cigarettes. She started smoking about 71 years ago. She has a 0.50 pack-year smoking history. She has never used smokeless tobacco. She reports previous alcohol use. She reports that she does not use drugs.    Objective: There were no vitals filed for this visit.  There is no height or weight on file to  calculate BMI.  Physical Exam     Assessment/plan:   No problem-specific Assessment & Plan notes found for this encounter.    No follow-ups on file.     Manuela Schwartz, MD [facility]  12/25/2019

## 2020-01-01 ENCOUNTER — Encounter: Payer: Self-pay | Admitting: Family Medicine

## 2020-01-22 ENCOUNTER — Encounter: Payer: Self-pay | Admitting: Family Medicine

## 2020-01-23 ENCOUNTER — Other Ambulatory Visit: Payer: Self-pay

## 2020-01-23 MED ORDER — ROSUVASTATIN CALCIUM 20 MG PO TABS: 20.0000 mg | ORAL_TABLET | Freq: Every day | ORAL | 3 refills | Status: DC

## 2020-01-28 ENCOUNTER — Ambulatory Visit: Payer: Medicare Other | Admitting: Family Medicine

## 2020-01-30 ENCOUNTER — Ambulatory Visit: Payer: Medicare Other | Admitting: Family Medicine

## 2020-02-03 ENCOUNTER — Encounter: Payer: Self-pay | Admitting: Family Medicine

## 2020-02-03 ENCOUNTER — Other Ambulatory Visit: Payer: Self-pay

## 2020-02-03 ENCOUNTER — Ambulatory Visit (INDEPENDENT_AMBULATORY_CARE_PROVIDER_SITE_OTHER): Payer: Medicare Other | Admitting: Family Medicine

## 2020-02-03 DIAGNOSIS — M1712 Unilateral primary osteoarthritis, left knee: Secondary | ICD-10-CM

## 2020-02-03 DIAGNOSIS — M1711 Unilateral primary osteoarthritis, right knee: Secondary | ICD-10-CM | POA: Diagnosis not present

## 2020-02-03 NOTE — Progress Notes (Signed)
I saw and examined the patient with Dr. Robby Sermon and agree with assessment and plan as outlined.    Bilateral knee pain, left greater than right.  Instability feeling on the left.  Will inject both with cortisone today.  Try glucosamine and turmeric as well.

## 2020-02-03 NOTE — Progress Notes (Signed)
Linda [name] - 86 y.o. female MRN [id number]  Date of birth: May 11, 1924  Office Visit Note: Visit Date: 02/03/2020 PCP: Orland Mustard, MD Referred by: Orland Mustard, MD  Subjective: Chief Complaint  Patient presents with  . Left Knee - Pain    Pain in both knees, left > right. Gives way on her. Requests cortisone in both knees.  . Right Knee - Pain   HPI: Linda [name] is a 86 y.o. female who comes in today with chronic bilateral knee pain, left worse than right. She reports that her left knee swells and gives out on her occasionally. She wears a knee sleeve which helps some. She takes tylenol. She requests cortisone injections in both knees today.  ROS Otherwise per HPI.  Assessment & Plan: Visit Diagnoses:  1. Unilateral primary osteoarthritis, left knee   2. Unilateral primary osteoarthritis, right knee     Plan: Patient elected for cortisone injections in bilateral knees. She felt good pain relief during anesthetic phase. Return as needed.   Meds & Orders: No orders of the defined types were placed in this encounter.  No orders of the defined types were placed in this encounter.   Follow-up: No follow-ups on file.   Procedures: Procedure performed: knee intraarticular corticosteroid injection  Noted no overlying erythema, induration, or other signs of local infection. Bilateral lateral suprapatellar joint spaces were palpated and marked. The overlying skin was prepped in a sterile fashion. Topical analgesic spray: Ethyl chloride. Joint: bilateral knees Needle: 25 gauge, 1.5 inch Completed without difficulty. Meds: 40 mg methylrprednisolone, 4 ml 1% lidocaine without epinephrine      Clinical History: No specialty comments available.   She reports that she quit smoking about 69 years ago. Her smoking use included cigarettes. She started smoking about 71 years ago. She has a 0.50 pack-year smoking history. She has never used smokeless tobacco. No results for  input(s): HGBA1C, LABURIC in the last 8760 hours.  Objective:  VS:  HT:    WT:   BMI:     BP:   HR: bpm  TEMP: ( )  RESP:  Physical Exam  PHYSICAL EXAM: Gen: NAD, alert, cooperative with exam, well-appearing HEENT: clear conjunctiva,  CV:  no edema, capillary refill brisk, normal rate Resp: non-labored Skin: no rashes, normal turgor  Neuro: no gross deficits.  Psych:  alert and oriented  Ortho Exam  Left Knee: - Inspection: no gross deformity. No swelling/effusion, erythema or bruising. Skin intact - Palpation: TTP along medial joint line and proximal medial patellar pole - ROM: full active ROM with flexion and extension in knee and hip - Strength: 5/5 strength - Neuro/vasc: NV intact - Special Tests: - LIGAMENTS: negative anterior and posterior drawer. Valgus stress with some opening bilaterally -- PF JOINT: nml patellar mobility bilaterally.  Right Knee: - Inspection: no gross deformity. No swelling/effusion, erythema or bruising. Skin intact - Palpation: no TTP - ROM: full active ROM with flexion and extension in knee and hip - Strength: 5/5 strength - Neuro/vasc: NV intact - Special Tests: - LIGAMENTS: negative anterior and posterior drawer. Valgus stress with some opening bilaterally -- PF JOINT: nml patellar mobility bilaterally.  Imaging: No results found.  Past Medical/Family/Surgical/Social History: Medications & Allergies reviewed per EMR, new medications updated. Patient Active Problem List   Diagnosis Date Noted  . Bronchiectasis without acute exacerbation (HCC) 07/14/2019  . Chronic cough 02/03/2019  . Thoracic back pain 10/13/2018  . Hyperlipidemia 10/13/2018  . Vitamin D deficiency 10/13/2018  .  Gastroesophageal reflux disease without esophagitis 10/13/2018  . Chronic midline thoracic back pain 10/13/2018  . Osteoporosis without current pathological fracture 10/13/2018  . Essential hypertension 10/13/2018  . Stenosis of carotid artery 10/13/2018   . History of thyroid nodule, s/p bx, benign 10/13/2018  . Aortic cusp regurgitation 10/13/2018  . Nonrheumatic tricuspid valve regurgitation 10/13/2018  . Coccyalgia 10/13/2018  . Assistance needed with transportation, no longer drives due to vision 10/13/2018  . Irritable bowel syndrome 10/13/2018  . Bilateral hearing loss 10/13/2018  . Chronic rhinitis 10/13/2018  . Paroxysmal atrial tachycardia ([place]) 10/13/2018   Past Medical History:  Diagnosis Date  . Arthritis   . Glaucoma   . Hyperlipidemia   . Hypertension    Family History  Problem Relation Age of Onset  . Heart disease Mother   . Heart disease Father   . Heart disease Brother   . Heart disease Brother    Past Surgical History:  Procedure Laterality Date  . APPENDECTOMY     Social History   Occupational History  . Occupation: Retired  Tobacco Use  . Smoking status: Former Smoker    Packs/day: 0.25    Years: 2.00    Pack years: 0.50    Types: Cigarettes    Start date: 89    Quit date: 1952    Years since quitting: 69.5  . Smokeless tobacco: Never Used  Vaping Use  . Vaping Use: Never used  Substance and Sexual Activity  . Alcohol use: Not Currently  . Drug use: Never  . Sexual activity: Not on file

## 2020-03-25 ENCOUNTER — Encounter: Payer: Self-pay | Admitting: Family Medicine

## 2020-03-25 ENCOUNTER — Other Ambulatory Visit: Payer: Self-pay

## 2020-03-25 ENCOUNTER — Ambulatory Visit (INDEPENDENT_AMBULATORY_CARE_PROVIDER_SITE_OTHER): Payer: Medicare Other | Admitting: Family Medicine

## 2020-03-25 VITALS — BP 167/55 | HR 68 | Temp 98.0°F | Ht 65.0 in | Wt 125.4 lb

## 2020-03-25 DIAGNOSIS — R197 Diarrhea, unspecified: Secondary | ICD-10-CM | POA: Diagnosis not present

## 2020-03-25 DIAGNOSIS — I10 Essential (primary) hypertension: Secondary | ICD-10-CM | POA: Diagnosis not present

## 2020-03-25 NOTE — Progress Notes (Signed)
Patient: Linda [name] MRN: [id number] DOB: April 14, 1924 PCP: Orland Mustard, MD     Subjective:  Chief Complaint  Patient presents with  . Diarrhea    Starting 2 weeks ago.    HPI: The patient is a 86 y.o. female who presents today for diarrhea. She says that this started 2 weeks ago after she ate a ham sandwich . She states she only has a loose stool in the AM when she gets up and it's yellow in nature, like mustard. No blood or dark stools. She has no other BM during the day. She has not changed anything in her diet, but [place] and [place]. No stomach pain or gas. No nausea/vomiting. She has been on no recent antibiotics. No sick contacts where she is at that she is aware of. Her daughter has been fine as well. Has not traveled. No weight loss. Normal urine output.   Review of Systems  Constitutional: Negative for chills and fever.  Eyes: Negative for visual disturbance.  Respiratory: Negative for cough and shortness of breath.   Cardiovascular: Negative for chest pain, palpitations and leg swelling.  Gastrointestinal: Positive for diarrhea. Negative for abdominal distention, abdominal pain, blood in stool, constipation, nausea and vomiting.  Genitourinary: Negative for pelvic pain.  Musculoskeletal: Negative for back pain.  Neurological: Negative for dizziness and headaches.    Allergies Patient has No Known Allergies.  Past Medical History Patient  has a past medical history of Arthritis, Glaucoma, Hyperlipidemia, and Hypertension.  Surgical History Patient  has a past surgical history that includes Appendectomy.  Family History Pateint's family history includes Heart disease in her brother, brother, father, and mother.  Social History Patient  reports that she quit smoking about 69 years ago. Her smoking use included cigarettes. She started smoking about 71 years ago. She has a 0.50 pack-year smoking history. She has never used smokeless tobacco. She reports  previous alcohol use. She reports that she does not use drugs.    Objective: Vitals:   03/25/20 1308  BP: (!) 167/55  Pulse: 68  Temp: 98 F (36.7 C)  TempSrc: Temporal  SpO2: 95%  Weight: 125 lb 6.4 oz (56.9 kg)  Height: 5\' 5"  (1.651 m)    Body mass index is 20.87 kg/m.  Physical Exam Vitals reviewed.  Constitutional:      Appearance: Normal appearance. She is well-developed and normal weight.  HENT:     Head: Normocephalic and atraumatic.     Comments: Hard of hearing     Right Ear: External ear normal.     Left Ear: External ear normal.  Eyes:     Conjunctiva/sclera: Conjunctivae normal.     Pupils: Pupils are equal, round, and reactive to light.  Neck:     Thyroid: No thyromegaly.  Cardiovascular:     Rate and Rhythm: Normal rate and regular rhythm.     Heart sounds: Normal heart sounds. No murmur heard.   Pulmonary:     Effort: Pulmonary effort is normal.     Breath sounds: Normal breath sounds.  Abdominal:     General: Abdomen is flat. Bowel sounds are normal. There is no distension.     Palpations: Abdomen is soft.     Tenderness: There is no abdominal tenderness.  Musculoskeletal:     Cervical back: Normal range of motion and neck supple.  Lymphadenopathy:     Cervical: No cervical adenopathy.  Skin:    General: Skin is warm and dry.  Findings: No rash.  Neurological:     General: No focal deficit present.     Mental Status: She is alert and oriented to person, place, and time.     Cranial Nerves: No cranial nerve deficit.     Coordination: Coordination normal.     Deep Tendon Reflexes: Reflexes normal.  Psychiatric:        Mood and Affect: Mood normal.        Behavior: Behavior normal.        Assessment/plan: 1. Diarrhea, unspecified type Discussed not so much diarrhea as more a change in stool consistency. Just checked labs 3 months ago and were normal including thyroid. Since not having increased frequency of stools will start with stool  studies. Encouraged her to start citrucel daily. If stool studies normal will check labs and send to GI.  - Ova and parasite examination; Future - Stool culture; Future - Clostridium difficile Toxin B, Qualitative, Real-Time PCR; Future - Fecal occult blood, imunochemical; Future - Fecal lactoferrin, quant; Future - Gastrointestinal Pathogen Panel PCR; Future  2. Essential hypertension Much higher today than she normally runs. Daughter is with her and will have her start checking it. She has f/u with me in 3 months but want them to let me know if consistently >160/90.   This visit occurred during the SARS-CoV-2 public health emergency.  Safety protocols were in place, including screening questions prior to the visit, additional usage of staff PPE, and extensive cleaning of exam room while observing appropriate contact time as indicated for disinfecting solutions.     Return if symptoms worsen or fail to improve.   Orland Mustard, MD  [facility]   03/25/2020

## 2020-03-25 NOTE — Patient Instructions (Addendum)
-  stool studies -start citrucel to see if this will help bulk up your stool some.   -if these are all normal and you are still still having issues will send to GI and do some lab work.   -watch your blood pressure as well.   See you back for regular appointment in 3 months.  Dr. Artis Flock

## 2020-03-26 ENCOUNTER — Other Ambulatory Visit: Payer: Self-pay

## 2020-03-26 ENCOUNTER — Other Ambulatory Visit: Payer: Medicare Other

## 2020-03-26 DIAGNOSIS — R197 Diarrhea, unspecified: Secondary | ICD-10-CM

## 2020-03-27 LAB — CLOSTRIDIUM DIFFICILE TOXIN B, QUALITATIVE, REAL-TIME PCR: Toxigenic C. Difficile by PCR: NOT DETECTED

## 2020-03-29 ENCOUNTER — Other Ambulatory Visit (INDEPENDENT_AMBULATORY_CARE_PROVIDER_SITE_OTHER): Payer: Medicare Other

## 2020-03-29 DIAGNOSIS — R197 Diarrhea, unspecified: Secondary | ICD-10-CM

## 2020-03-29 LAB — FECAL OCCULT BLOOD, IMMUNOCHEMICAL: Fecal Occult Bld: NEGATIVE

## 2020-03-31 ENCOUNTER — Other Ambulatory Visit: Payer: Self-pay | Admitting: Family Medicine

## 2020-03-31 ENCOUNTER — Encounter: Payer: Self-pay | Admitting: Family Medicine

## 2020-03-31 DIAGNOSIS — K219 Gastro-esophageal reflux disease without esophagitis: Secondary | ICD-10-CM

## 2020-03-31 DIAGNOSIS — R197 Diarrhea, unspecified: Secondary | ICD-10-CM

## 2020-04-05 LAB — OVA AND PARASITE EXAMINATION
CONCENTRATE RESULT:: NONE SEEN
MICRO NUMBER:: 10824662
SPECIMEN QUALITY:: ADEQUATE
TRICHROME RESULT:: NONE SEEN

## 2020-04-05 LAB — STOOL CULTURE
MICRO NUMBER:: 10824661
MICRO NUMBER:: 10824663
SHIGA RESULT:: NOT DETECTED
SPECIMEN QUALITY:: ADEQUATE
SPECIMEN QUALITY:: ADEQUATE

## 2020-04-05 LAB — FECAL LACTOFERRIN, QUANT

## 2020-04-05 LAB — GASTROINTESTINAL PATHOGEN PANEL PCR

## 2020-04-06 ENCOUNTER — Telehealth: Payer: Self-pay | Admitting: Family Medicine

## 2020-04-06 NOTE — Telephone Encounter (Signed)
Patient is requesting antibiotics for her loose bowels and she isnt feeling well and would like something sent to [id number] - [place], [place] - [address] AT [facility] [address] [facility]

## 2020-04-06 NOTE — Telephone Encounter (Signed)
FYI

## 2020-04-07 NOTE — Telephone Encounter (Signed)
She has no infection that warrants any antibiotics. Her stool culture and panel were negative for bacterial infection therefore antibiotics are not even indicated. I put in a referral to GI which is the next step to make sure she doesn't need a colonoscopy for the loose stools.  Orland Mustard, MD [facility]

## 2020-04-07 NOTE — Telephone Encounter (Signed)
I spoke with the pt to give the message below. Pt  expressed understanding.

## 2020-04-09 ENCOUNTER — Encounter: Payer: Self-pay | Admitting: Family Medicine

## 2020-04-14 ENCOUNTER — Telehealth: Payer: Self-pay

## 2020-04-14 NOTE — Telephone Encounter (Signed)
Let daughter know we can also see her this week and do some lab work to make sure no dehydration/white cell count and I can reexamine her belly to make sure no diverticulitis. Can they come in Thursday or Friday? I would fell better seeing her again and doing labs.  Thanks,  Dr. Artis Flock

## 2020-04-14 NOTE — Telephone Encounter (Signed)
Please let her know that a zpack is not used for any kind of diarrhea illness. It has no coverage for gut disease. Again, using antibiotics can make things worse when no indication. If she has a lot of other things, would advise appointment. We can see if we can get her into GI sooner as well. Would call [facility] and see about moving this up.  Thanks,  Dr. Artis Flock

## 2020-04-14 NOTE — Telephone Encounter (Signed)
Pt would like to be called about " loose bowels and lots of complicated things"

## 2020-04-14 NOTE — Telephone Encounter (Signed)
I spoke with the pt's daughter, to give message below. She says that she is not getting any better, and does not want to let this run her down. By this she means send her the hospital or Nursing Facility. I was also able to call [facility] to see if there were any cancellations, and to see if she can be moved up. There have been no cancellations, and I was informed that she is actually seeing a PA to give her an earlier slot. They are now booking in November.

## 2020-04-15 NOTE — Telephone Encounter (Signed)
Pt is scheduled for tomorrow 

## 2020-04-16 ENCOUNTER — Other Ambulatory Visit: Payer: Self-pay

## 2020-04-16 ENCOUNTER — Ambulatory Visit (INDEPENDENT_AMBULATORY_CARE_PROVIDER_SITE_OTHER): Payer: Medicare Other | Admitting: Family Medicine

## 2020-04-16 ENCOUNTER — Encounter: Payer: Self-pay | Admitting: Family Medicine

## 2020-04-16 VITALS — BP 156/64 | HR 64 | Temp 97.6°F | Ht 65.0 in | Wt 124.0 lb

## 2020-04-16 DIAGNOSIS — R197 Diarrhea, unspecified: Secondary | ICD-10-CM

## 2020-04-16 MED ORDER — AMOXICILLIN-POT CLAVULANATE 875-125 MG PO TABS: 1.0000 | ORAL_TABLET | Freq: Two times a day (BID) | ORAL | 0 refills | Status: DC

## 2020-04-16 NOTE — Patient Instructions (Signed)
-  I think a lot of this is your diet or lack there of. Really try to increase fiber in your diet and work on eating more during the day.   -make sure drinking water  -im going to check labs on you today and send in prescription to treat diverticulitis, although clinical suspicion is low for this. If white blood cell count is elevated I would start antibiotic. Typically with diarrhea we discuss a colonoscopy, but that conversation will be with the GI doc.   - I will also re order the stool panel that was cancelled.

## 2020-04-16 NOTE — Progress Notes (Signed)
Patient: Linda [name] MRN: [id number] DOB: 1924/02/28 PCP: Orland Mustard, MD     Subjective:  Chief Complaint  Patient presents with  . Diarrhea    HPI: The patient is a 86 y.o. female who presents today for diarrhea.  I saw her on 8/12 and did stool studies that have been unremarkable. She continues to have one episode of diarrhea in the AM and sometimes another episode in the later morning. After she goes to the bathroom she feels better. She also isn't eating much. She says that it wakes her up out of her sleep at 6 am. She goes again later in the morning.  She does have a history of IBS. She doesn't eat hardly anything for breakfast. She will only eat a dinner role and sometimes a few handfuls of cheerios. Poop is mustard yellow in color with dark green grass. Her diet is poor with limited fiber intake. She has had no fever/chills or change in abdomen. Doesn't really have any pain. No visible blood.   Stool studies have been negative including negative for blood and c.diff as well as stool culture. Her ova and parasites were negative. Stool panel was cancelled for some reason.   Review of Systems  Constitutional: Negative for chills and fever.  Respiratory: Negative for shortness of breath.   Gastrointestinal: Positive for diarrhea. Negative for abdominal pain, nausea and vomiting.  Neurological: Negative for dizziness and headaches.    Allergies Patient has No Known Allergies.  Past Medical History Patient  has a past medical history of Arthritis, Glaucoma, Hyperlipidemia, and Hypertension.  Surgical History Patient  has a past surgical history that includes Appendectomy.  Family History Pateint's family history includes Heart disease in her brother, brother, father, and mother.  Social History Patient  reports that she quit smoking about 69 years ago. Her smoking use included cigarettes. She started smoking about 71 years ago. She has a 0.50 pack-year smoking history. She  has never used smokeless tobacco. She reports previous alcohol use. She reports that she does not use drugs.    Objective: Vitals:   04/16/20 1257  BP: (!) 156/64  Pulse: 64  Temp: 97.6 F (36.4 C)  TempSrc: Temporal  SpO2: 96%  Weight: 124 lb (56.2 kg)  Height: 5\' 5"  (1.651 m)    Body mass index is 20.63 kg/m.  Physical Exam Vitals reviewed.  Constitutional:      Appearance: Normal appearance. She is normal weight.  HENT:     Head: Normocephalic and atraumatic.  Cardiovascular:     Rate and Rhythm: Normal rate and regular rhythm.     Heart sounds: Normal heart sounds.  Pulmonary:     Effort: Pulmonary effort is normal.     Breath sounds: Normal breath sounds.  Abdominal:     General: Abdomen is flat. Bowel sounds are normal.     Palpations: Abdomen is soft.     Tenderness: There is abdominal tenderness (RLQ and LLQ). There is no guarding or rebound.  Neurological:     Mental Status: She is alert.        Assessment/plan: 1. Diarrhea, unspecified type Symptoms have not changed much and she has a non acute abdomen. She is only have 1-2 episodes/day and feels better. She does have a history of IBS. Her fecal occult/ova and parasites and c.diff were negative. She does have ttp on exam in lower quadrants. Discussed we will check labs and send in augmentin to cover for diverticulitis. Already has appointment with GI  on 10/4. Diet is likely huge contributing factor and advised more fiber and water. Precautions given.  - CBC with Differential/Platelet; Future - Comprehensive metabolic panel; Future - Sedimentation rate; Future - C-reactive protein; Future - TSH; Future - Fecal lactoferrin, quant; Future - Gastrointestinal Pathogen Panel PCR; Future - TSH - C-reactive protein - Comprehensive metabolic panel - Sedimentation rate - CBC with Differential/Platelet  This visit occurred during the SARS-CoV-2 public health emergency.  Safety protocols were in place, including  screening questions prior to the visit, additional usage of staff PPE, and extensive cleaning of exam room while observing appropriate contact time as indicated for disinfecting solutions.     Return if symptoms worsen or fail to improve.   Orland Mustard, MD [place]   04/16/2020

## 2020-04-17 LAB — COMPREHENSIVE METABOLIC PANEL
AG Ratio: 1.6 (calc) (ref 1.0–2.5)
ALT: 14 U/L (ref 6–29)
AST: 24 U/L (ref 10–35)
Albumin: 4.3 g/dL (ref 3.6–5.1)
Alkaline phosphatase (APISO): 88 U/L (ref 37–153)
BUN: 13 mg/dL (ref 7–25)
CO2: 27 mmol/L (ref 20–32)
Calcium: 10.5 mg/dL — ABNORMAL HIGH (ref 8.6–10.4)
Chloride: 103 mmol/L (ref 98–110)
Creat: 0.69 mg/dL (ref 0.60–0.88)
Globulin: 2.7 g/dL (calc) (ref 1.9–3.7)
Glucose, Bld: 129 mg/dL — ABNORMAL HIGH (ref 65–99)
Potassium: 4.5 mmol/L (ref 3.5–5.3)
Sodium: 139 mmol/L (ref 135–146)
Total Bilirubin: 0.3 mg/dL (ref 0.2–1.2)
Total Protein: 7 g/dL (ref 6.1–8.1)

## 2020-04-17 LAB — CBC WITH DIFFERENTIAL/PLATELET
Absolute Monocytes: 480 cells/uL (ref 200–950)
Basophils Absolute: 40 cells/uL (ref 0–200)
Basophils Relative: 0.5 %
Eosinophils Absolute: 176 cells/uL (ref 15–500)
Eosinophils Relative: 2.2 %
HCT: 41.3 % (ref 35.0–45.0)
Hemoglobin: 13.5 g/dL (ref 11.7–15.5)
Lymphs Abs: 1656 cells/uL (ref 850–3900)
MCH: 31.9 pg (ref 27.0–33.0)
MCHC: 32.7 g/dL (ref 32.0–36.0)
MCV: 97.6 fL (ref 80.0–100.0)
MPV: 9.7 fL (ref 7.5–12.5)
Monocytes Relative: 6 %
Neutro Abs: 5648 cells/uL (ref 1500–7800)
Neutrophils Relative %: 70.6 %
Platelets: 241 10*3/uL (ref 140–400)
RBC: 4.23 10*6/uL (ref 3.80–5.10)
RDW: 12.3 % (ref 11.0–15.0)
Total Lymphocyte: 20.7 %
WBC: 8 10*3/uL (ref 3.8–10.8)

## 2020-04-17 LAB — TSH: TSH: 0.48 mIU/L (ref 0.40–4.50)

## 2020-04-17 LAB — SEDIMENTATION RATE: Sed Rate: 22 mm/h (ref 0–30)

## 2020-04-17 LAB — C-REACTIVE PROTEIN: CRP: 3.7 mg/L (ref <8.0)

## 2020-04-20 ENCOUNTER — Other Ambulatory Visit: Payer: Medicare Other

## 2020-04-20 DIAGNOSIS — R197 Diarrhea, unspecified: Secondary | ICD-10-CM

## 2020-04-21 LAB — FECAL LACTOFERRIN, QUANT
Fecal Lactoferrin: NEGATIVE
MICRO NUMBER:: 10917418
SPECIMEN QUALITY:: ADEQUATE

## 2020-04-26 LAB — GASTROINTESTINAL PATHOGEN PANEL PCR
C. difficile Tox A/B, PCR: NOT DETECTED
Campylobacter, PCR: NOT DETECTED
Cryptosporidium, PCR: NOT DETECTED
E coli (ETEC) LT/ST PCR: NOT DETECTED
E coli (STEC) stx1/stx2, PCR: NOT DETECTED
E coli 0157, PCR: NOT DETECTED
Giardia lamblia, PCR: NOT DETECTED
Norovirus, PCR: NOT DETECTED
Rotavirus A, PCR: NOT DETECTED
Salmonella, PCR: NOT DETECTED
Shigella, PCR: NOT DETECTED

## 2020-05-17 ENCOUNTER — Other Ambulatory Visit: Payer: Self-pay | Admitting: Physician Assistant

## 2020-05-17 ENCOUNTER — Ambulatory Visit (INDEPENDENT_AMBULATORY_CARE_PROVIDER_SITE_OTHER): Payer: Medicare Other | Admitting: Physician Assistant

## 2020-05-17 ENCOUNTER — Encounter: Payer: Self-pay | Admitting: Physician Assistant

## 2020-05-17 ENCOUNTER — Other Ambulatory Visit (INDEPENDENT_AMBULATORY_CARE_PROVIDER_SITE_OTHER): Payer: Medicare Other

## 2020-05-17 VITALS — BP 127/62 | HR 74 | Ht 65.0 in | Wt 124.6 lb

## 2020-05-17 DIAGNOSIS — R1030 Lower abdominal pain, unspecified: Secondary | ICD-10-CM | POA: Diagnosis not present

## 2020-05-17 DIAGNOSIS — R197 Diarrhea, unspecified: Secondary | ICD-10-CM | POA: Diagnosis not present

## 2020-05-17 DIAGNOSIS — R194 Change in bowel habit: Secondary | ICD-10-CM

## 2020-05-17 LAB — BASIC METABOLIC PANEL
BUN: 15 mg/dL (ref 6–23)
CO2: 30 mEq/L (ref 19–32)
Calcium: 10.2 mg/dL (ref 8.4–10.5)
Chloride: 103 mEq/L (ref 96–112)
Creatinine, Ser: 0.71 mg/dL (ref 0.40–1.20)
GFR: 76.63 mL/min (ref >60.00)
Glucose, Bld: 118 mg/dL — ABNORMAL HIGH (ref 70–99)
Potassium: 3.6 mEq/L (ref 3.5–5.1)
Sodium: 139 mEq/L (ref 135–145)

## 2020-05-17 NOTE — Patient Instructions (Signed)
If you are age 84 or older, your body mass index should be between 23-30. Your Body mass index is 20.73 kg/m. If this is out of the aforementioned range listed, please consider follow up with your Primary Care Provider.  If you are age 64 or younger, your body mass index should be between 19-25. Your Body mass index is 20.73 kg/m. If this is out of the aformentioned range listed, please consider follow up with your Primary Care Provider.   Your provider has requested that you go to the basement level for lab work before leaving today. Press "B" on the elevator. The lab is located at the first door on the left as you exit the elevator.  You have been scheduled for a CT scan of the abdomen and pelvis at [facility] ([address] ).   You are scheduled on 05/31/20 at 2:40 pm. You should arrive 20 minutes prior to your appointment time for registration. Please follow the written instructions below on the day of your exam:  WARNING: IF YOU ARE ALLERGIC TO IODINE/X-RAY DYE, PLEASE NOTIFY RADIOLOGY IMMEDIATELY AT [phone]! YOU WILL BE GIVEN A 13 HOUR PREMEDICATION PREP.  1) Do not eat or drink anything after 10:40 am  (4 hours prior to your test) 2) You have been given 2 bottles of oral contrast to drink. The solution may taste better if refrigerated, but do NOT add ice or any other liquid to this solution. Shake well before drinking.    Drink 1 bottle of contrast @ 12:40 pm (2 hours prior to your exam)  Drink 1 bottle of contrast @ 1:40 pm (1 hour prior to your exam)  You may take any medications as prescribed with a small amount of water, if necessary. If you take any of the following medications: METFORMIN, GLUCOPHAGE, GLUCOVANCE, AVANDAMET, RIOMET, FORTAMET, ACTOPLUS MET, JANUMET, GLUMETZA or METAGLIP, you MAY be asked to HOLD this medication 48 hours AFTER the exam.  The purpose of you drinking the oral contrast is to aid in the visualization of your intestinal  tract. The contrast solution may cause some diarrhea. Depending on your individual set of symptoms, you may also receive an intravenous injection of x-ray contrast/dye. Plan on being at [facility] for 30 minutes or longer, depending on the type of exam you are having performed.  This test typically takes 30-45 minutes to complete.  If you have any questions regarding your exam or if you need to reschedule, you may call [facility] department at [phone] between the hours of 8:00 am and 5:00 pm, Monday-Friday.  ________________________________________________________________________  START a trial of Culturelle or Nature's Bounty probiotic daily.  START a trial of IBGard 2 capsules every morning.  STOP artificial sweeteners.   Follow up pending the results of your CT or as needed. 

## 2020-05-18 ENCOUNTER — Encounter: Payer: Self-pay | Admitting: Physician Assistant

## 2020-05-18 NOTE — Progress Notes (Signed)
Subjective:    Patient ID: Linda [name], female    DOB: Apr 03, 1924, 86 y.o.   MRN: [id number]  HPI Linda [name] is a pleasant 86 year old white female, new to GI today referred by Orland Mustard MD for evaluation of complaints of diarrhea. Patient has history of hypertension, IBS, PAT, bronchiectasis and GERD. She recalls having prior colonoscopy but says this would have been many years ago perhaps from living in [place]. She says that her current symptoms started at the end of June 2021 and prior to that was not having any problems with abdominal discomfort and was having normal bowel movements.  She says at onset of symptoms she started having 2 diarrheal stools per day which has persisted ever since.  She has not had any associated fever, no nausea or vomiting.  Appetite has not been good which she attributes to her age, weight has been stable.  No melena or hematochezia.  She was seen by her PCP and has undergone labs which included CBC and chemistries which were normal, TSH within normal limits, sed rate 22, CRP 3.7, stool for lactoferrin negative, GI path panel negative prior to that stool for C. difficile and stool for O&P both negative and Hemoccult was negative. She was offered a course of Augmentin to cover for possible diverticulitis in early September but has declined to take that. She says she is now usually just having 1 bowel movement per day and on careful questioning she says the stool is soft but more formed than it was.  She says she just does not feel well in the mornings until after she has a bowel movement and is uncomfortable in her lower abdomen.  When she is able to have a bowel movement symptoms seem to ease but then recur the following morning.  She denies any excessive gassiness or bloating. She says "something just does not feel right" and she would "like something to kill it". She does not want to undergo colonoscopy. No recent abdominal imaging. No new medications,  supplements or recent antibiotics..  Review of Systems Pertinent positive and negative review of systems were noted in the above HPI section.  All other review of systems was otherwise negative.  Outpatient Encounter Medications as of 05/17/2020  Medication Sig  . acetaminophen (TYLENOL) 325 MG tablet Take 650 mg by mouth every 6 (six) hours as needed.  Marland Kitchen aspirin EC 81 MG tablet Take 81 mg by mouth in the morning and at bedtime.   . Cholecalciferol (VITAMIN D3) 25 MCG (1000 UT) CAPS Take 1 capsule by mouth daily.  . ciclopirox (LOPROX) 0.77 % cream Apply topically 2 (two) times daily.   . clobetasol cream (TEMOVATE) 0.05 % Apply 1 application topically as needed.  . famotidine (PEPCID) 20 MG tablet TAKE 1 TABLET TWICE A DAY  . FLUAD 0.5 ML SUSY   . fluticasone (FLONASE) 50 MCG/ACT nasal spray Place 1 spray into both nostrils daily as needed for allergies or rhinitis.  . Multiple Vitamin (MULTIVITAMIN) tablet Take 1 tablet by mouth daily.  . rosuvastatin (CRESTOR) 20 MG tablet Take 1 tablet (20 mg total) by mouth daily.  . sotalol (BETAPACE) 80 MG tablet Take 1 tablet (80 mg total) by mouth every 12 (twelve) hours.  . timolol (TIMOPTIC) 0.25 % ophthalmic solution   . triamcinolone cream (KENALOG) 0.1 % Apply 1 application topically 2 (two) times daily.  . [DISCONTINUED] amoxicillin-clavulanate (AUGMENTIN) 875-125 MG tablet Take 1 tablet by mouth 2 (two) times daily. (Patient not taking: Reported  on 05/17/2020)  . [DISCONTINUED] traMADol (ULTRAM) 50 MG tablet Take 1 tablet (50 mg total) by mouth daily as needed. For severe pain (Patient not taking: Reported on 05/17/2020)   No facility-administered encounter medications on file as of 05/17/2020.   No Known Allergies Patient Active Problem List   Diagnosis Date Noted  . Bronchiectasis without acute exacerbation (HCC) 07/14/2019  . Chronic cough 02/03/2019  . Thoracic back pain 10/13/2018  . Hyperlipidemia 10/13/2018  . Vitamin D deficiency  10/13/2018  . Gastroesophageal reflux disease without esophagitis 10/13/2018  . Chronic midline thoracic back pain 10/13/2018  . Osteoporosis without current pathological fracture 10/13/2018  . Essential hypertension 10/13/2018  . Stenosis of carotid artery 10/13/2018  . History of thyroid nodule, s/p bx, benign 10/13/2018  . Aortic cusp regurgitation 10/13/2018  . Nonrheumatic tricuspid valve regurgitation 10/13/2018  . Coccyalgia 10/13/2018  . Assistance needed with transportation, no longer drives due to vision 10/13/2018  . Irritable bowel syndrome 10/13/2018  . Bilateral hearing loss 10/13/2018  . Chronic rhinitis 10/13/2018  . Paroxysmal atrial tachycardia (HCC) 10/13/2018   Social History   Socioeconomic History  . Marital status: Widowed    Spouse name: Not on file  . Number of children: Not on file  . Years of education: Not on file  . Highest education level: Not on file  Occupational History  . Occupation: Retired  Tobacco Use  . Smoking status: Former Smoker    Packs/day: 0.25    Years: 2.00    Pack years: 0.50    Types: Cigarettes    Start date: 58    Quit date: 1952    Years since quitting: 69.8  . Smokeless tobacco: Never Used  Vaping Use  . Vaping Use: Never used  Substance and Sexual Activity  . Alcohol use: Not Currently  . Drug use: Never  . Sexual activity: Not on file  Other Topics Concern  . Not on file  Social History Narrative   Resident at [employer]    Enjoys playing cards    Social Determinants of Health   Financial Resource Strain:   . Difficulty of Paying Living Expenses: Not on file  Food Insecurity:   . Worried About Programme researcher, broadcasting/film/video in the Last Year: Not on file  . Ran Out of Food in the Last Year: Not on file  Transportation Needs:   . Lack of Transportation (Medical): Not on file  . Lack of Transportation (Non-Medical): Not on file  Physical Activity:   . Days of Exercise per Week: Not on file  . Minutes of  Exercise per Session: Not on file  Stress:   . Feeling of Stress : Not on file  Social Connections:   . Frequency of Communication with Friends and Family: Not on file  . Frequency of Social Gatherings with Friends and Family: Not on file  . Attends Religious Services: Not on file  . Active Member of Clubs or Organizations: Not on file  . Attends Banker Meetings: Not on file  . Marital Status: Not on file  Intimate Partner Violence:   . Fear of Current or Ex-Partner: Not on file  . Emotionally Abused: Not on file  . Physically Abused: Not on file  . Sexually Abused: Not on file    Ms. Milkovich's family history includes Heart disease in her brother, brother, father, and mother.      Objective:    Vitals:   05/17/20 1331  BP: 127/62  Pulse:  74  SpO2: 95%    Physical Exam Well-developed well-nourished elderly white female in no acute distress.  Height, Weight 124, BMI 20.7.  Patient accompanied by her daughterR  HEENT; nontraumatic normocephalic, EOMI, PE R LA, sclera anicteric. Oropharynx; not examined today Neck; supple, no JVD Cardiovascular; regular rate and rhythm with S1-S2, no murmur rub or gallop Pulmonary; Clear bilaterally Abdomen; soft, mild bilateral lower quadrant tenderness no guarding or rebound no palpable mass, nondistended, no hepatosplenomegaly, bowel sounds are active Rectal; not done today Skin; benign exam, no jaundice rash or appreciable lesions Extremities; no clubbing cyanosis or edema skin warm and dry Neuro/Psych; alert and oriented x4, grossly nonfocal mood and affect appropriate       Assessment & Plan:   #73 86 year old white female with 75-month history of change in bowel habits and vague lower abdominal discomfort generally relieved with a bowel movement.  Patient had diarrhea at onset of symptoms usually having 2 diarrheal stools per day.  She is having 1 bowel movement per day generally now loose to soft.  No melena or  hematochezia. Appetite not good Recent extensive lab evaluation reassuring. Recent infectious work-up including lactoferrin, C. difficile, GI path panel stool for O&P all negative.  Etiology of her symptoms is not clear, she seems to have bilateral lower abdominal discomfort associated. Rule out underlying malignancy, rule out low-grade diverticulitis, rule out possible mild microscopic colitis. Doubt SIBO but cannot rule out  #2 history of hypertension 3.  PAT 4.  Bronchiectasis  Plan; BMET Schedule for CT of the abdomen pelvis with contrast. Start IBgard 2 p.o. daily Had been using some Splenda, discontinue Start trial of probiotic over the next month.  Suggested Culturelle or natures bounty 10 If CT is negative and symptoms are persisting could consider an empiric course of Xifaxan 550 3 times daily x14 days. Patient will be established with Dr. Christell Constant PA-C 05/18/2020   Cc: Orland Mustard, MD

## 2020-05-19 ENCOUNTER — Other Ambulatory Visit: Payer: Medicare Other

## 2020-05-24 ENCOUNTER — Encounter: Payer: Self-pay | Admitting: Family Medicine

## 2020-05-26 ENCOUNTER — Ambulatory Visit
Admission: RE | Admit: 2020-05-26 | Discharge: 2020-05-26 | Disposition: A | Discharge status: Home / Self Care | Payer: Medicare Other | Source: Ambulatory Visit | Attending: Physician Assistant | Admitting: Physician Assistant

## 2020-05-26 ENCOUNTER — Ambulatory Visit
Admission: RE | Admit: 2020-05-26 | Discharge: 2020-05-26 | Disposition: A | Discharge status: Home / Self Care | Payer: Medicare Other | Source: Ambulatory Visit | Attending: Emergency Medicine | Admitting: Emergency Medicine

## 2020-05-26 DIAGNOSIS — R197 Diarrhea, unspecified: Secondary | ICD-10-CM

## 2020-05-26 DIAGNOSIS — E041 Nontoxic single thyroid nodule: Secondary | ICD-10-CM

## 2020-05-26 DIAGNOSIS — R194 Change in bowel habit: Secondary | ICD-10-CM

## 2020-05-26 DIAGNOSIS — J479 Bronchiectasis, uncomplicated: Secondary | ICD-10-CM

## 2020-05-26 DIAGNOSIS — R1030 Lower abdominal pain, unspecified: Secondary | ICD-10-CM

## 2020-05-26 IMAGING — CT CT CHEST W/O CM
2 of 4 series · 13 of 36 positions shown, 16 images · non-contrast
Comparison: [DATE].

CLINICAL DATA: Bronchiectasis and chronic cough.

EXAM:
CT CHEST WITHOUT CONTRAST
TECHNIQUE: Multidetector CT imaging of the chest was performed following the
standard protocol without IV contrast.

[Series 2: chest 2.00 br40 s3 · axial · 0.46mm/px · z∈[-920,-661]mm · 10 of 154 slices shown, 13 images (1 of 2)]
[im 12/154  mediastinal]
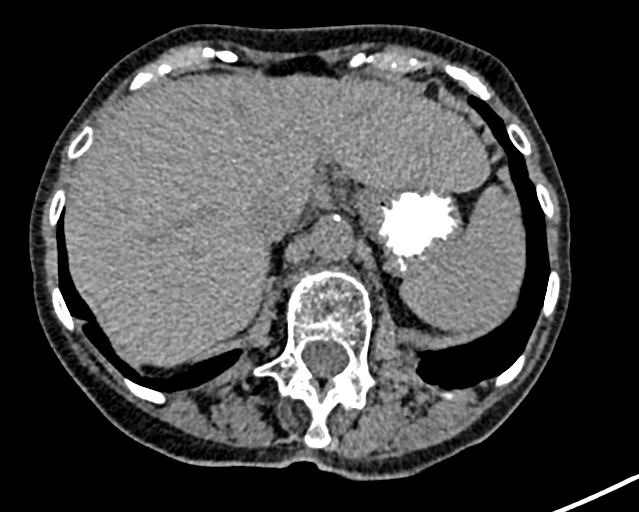
[im 12/154  lung]
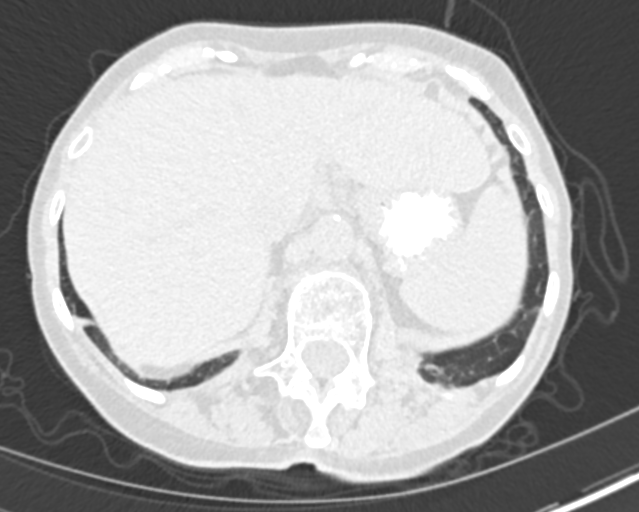
[im 24/154  lung]
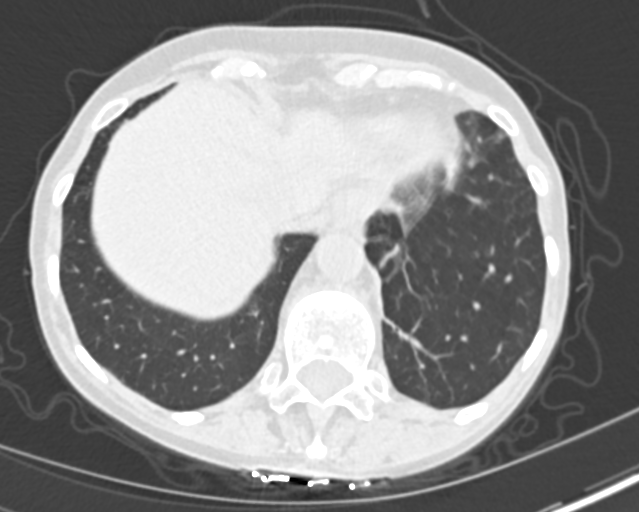
[im 48/154  lung]
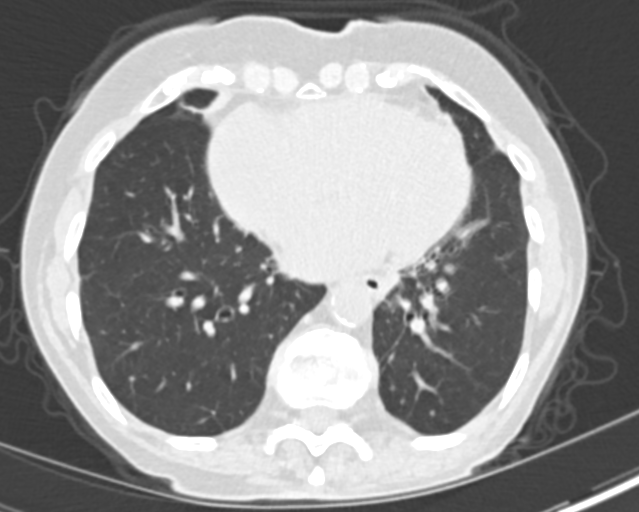
[im 59/154  lung]
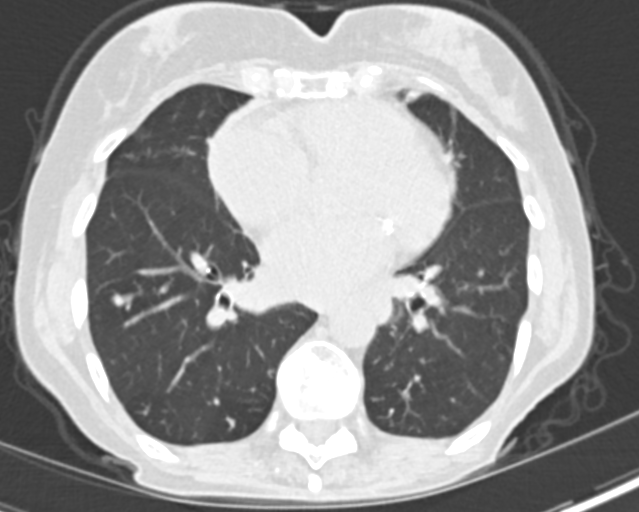
[im 71/154  mediastinal]
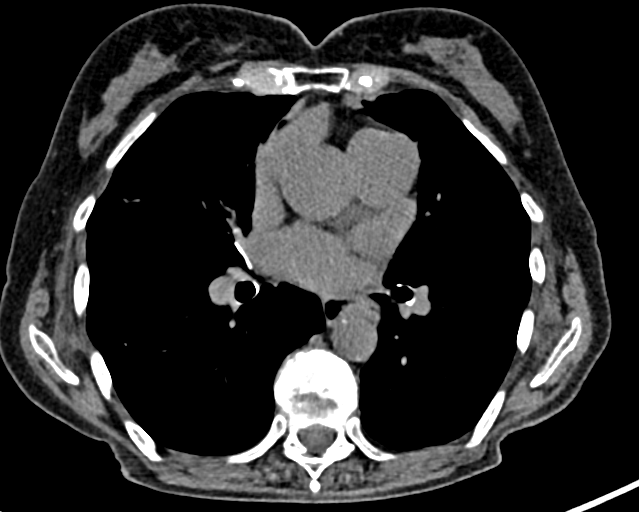
[im 71/154  lung]
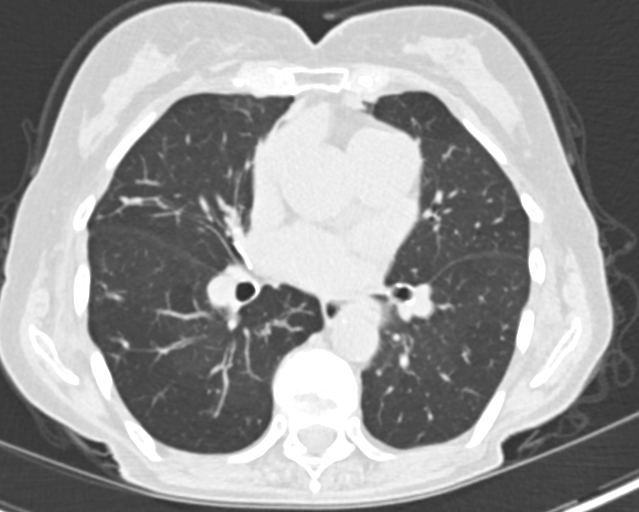
[im 83/154  lung]
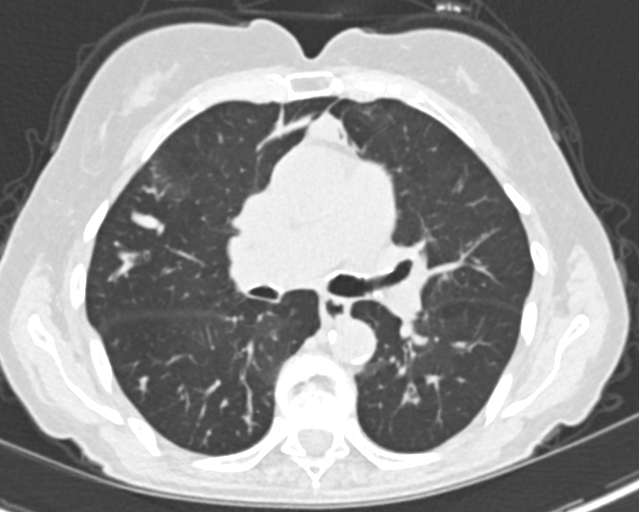
[im 95/154  lung]
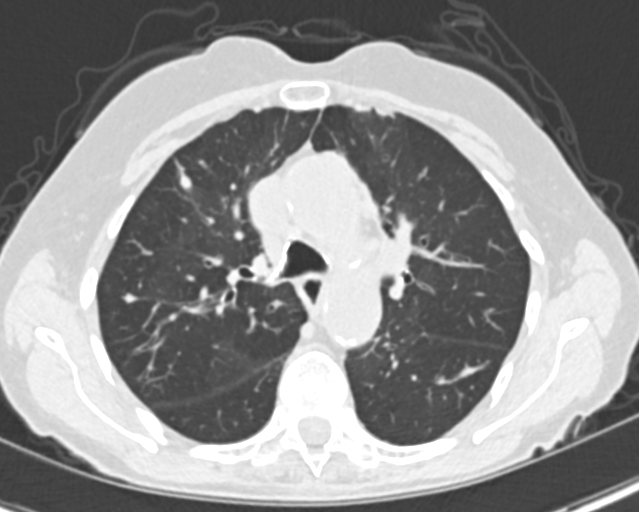
[im 118/154  lung]
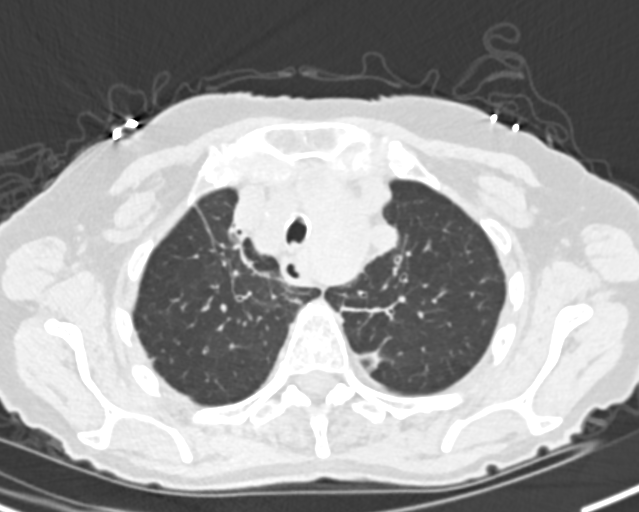
[im 130/154  mediastinal]
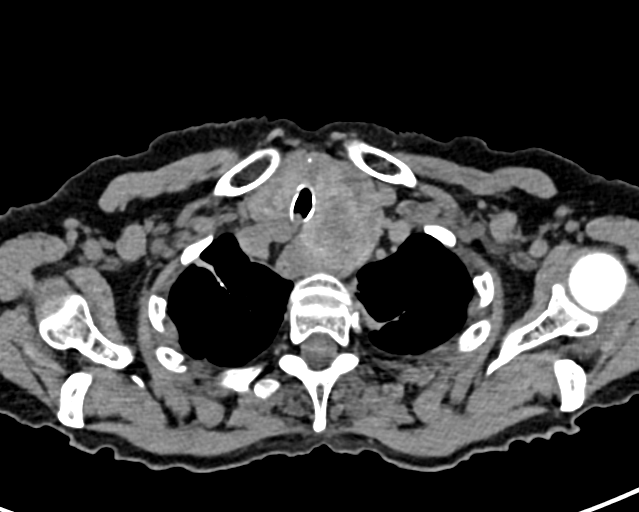
[im 130/154  lung]
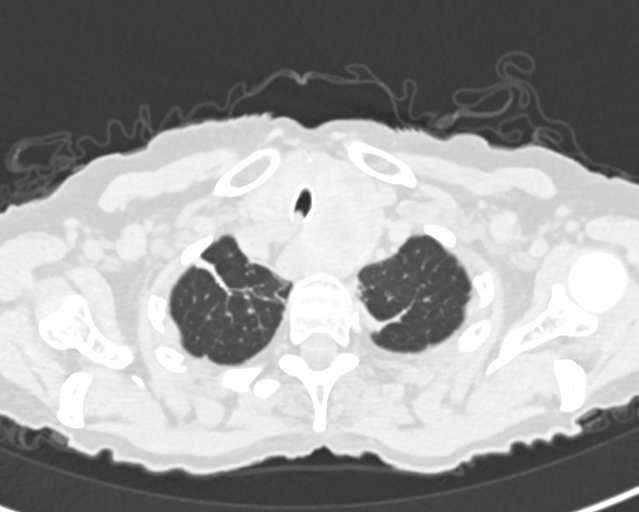
[im 142/154  lung]
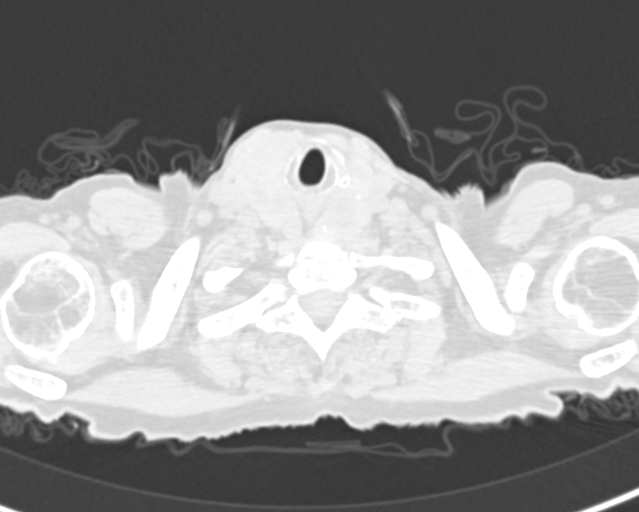

[Series 4: chest 2.00 br40 s3 · coronal · 0.58mm/px · 3 of 118 slices shown (2 of 2)]
[im 24/118  lung]
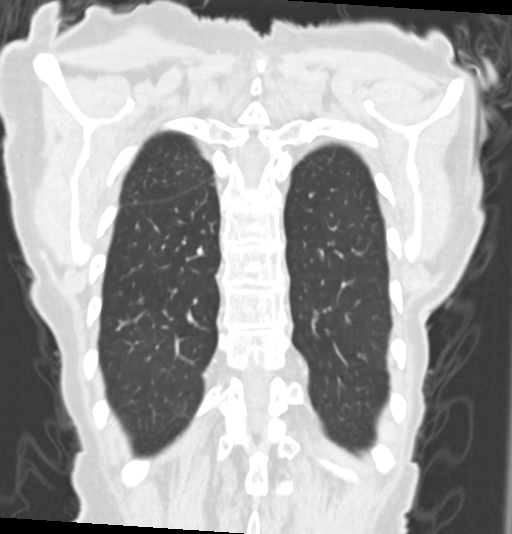
[im 47/118  lung]
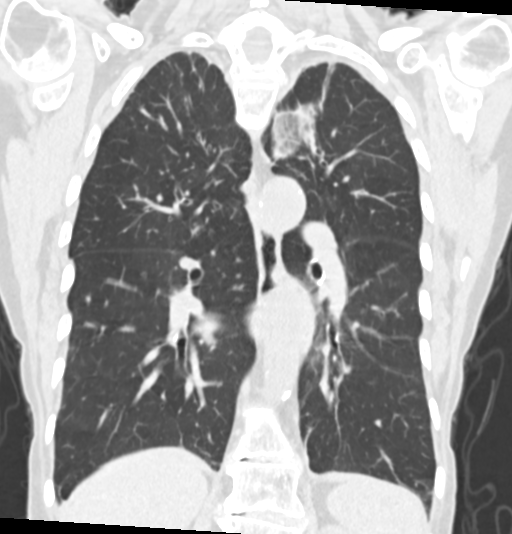
[im 71/118  lung]
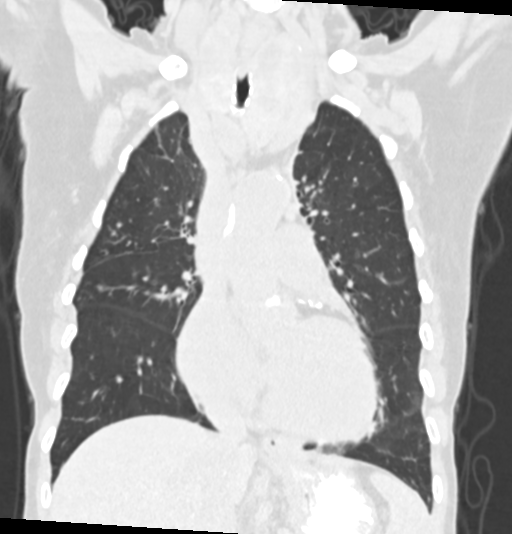

[13 of 36 positions shown; findings below may reference images not displayed]

FINDINGS: Cardiovascular: Atherosclerotic calcification of the aorta, aortic
valve and coronary arteries. Heart is enlarged. No pericardial
effusion.

Mediastinum/Nodes: Thyroid is heterogeneous, enlarged and nodular.
Nodules measure up to at least 2.0 x 2.3 cm on the left. There is
rightward mass effect on the upper esophagus. No pathologically
enlarged mediastinal or axillary lymph nodes. Hilar regions are
difficult to evaluate without IV contrast. Esophagus is somewhat
dilated along its course.

Lungs/Pleura: Biapical pleuroparenchymal scarring. Mild cylindrical
bronchiectasis, peribronchial thickening, peribronchovascular
nodularity, mucoid impaction and scattered volume loss. Findings are
minimally progressive from the prior exam, for example, in the
superior segment right lower lobe (8/64). No pleural fluid. Mild
rightward mass effect on the trachea related to thyroid enlargement.
Airway is otherwise unremarkable.

Upper Abdomen: Visualized portions of the liver, adrenal glands,
spleen and stomach are grossly unremarkable.

Musculoskeletal: Degenerative changes in the spine. No worrisome
lytic or sclerotic lesions.
IMPRESSION: 1. Pulmonary parenchymal pattern bronchiectasis, peribronchovascular
nodularity, mucoid impaction and scattered volume loss, minimally
progressive from [DATE] and in keeping with mycobacterium avium
complex.
2. Heterogeneous, enlarged and nodular thyroid. No follow-up
recommended unless clinically warranted (ref: [HOSPITAL]. [YV]
3. Aortic atherosclerosis ([YV]-[YV]). Coronary artery
calcification.

## 2020-05-26 IMAGING — CT CT NECK W/ CM
4 of 5 series · 14 of 33 positions shown, 16 images · IV contrast (iopamidol)
Comparison: None.

CLINICAL DATA: Thyroid nodule.

EXAM:
CT NECK WITH CONTRAST
TECHNIQUE: Multidetector CT imaging of the neck was performed using the
standard protocol following the bolus administration of intravenous
contrast.
CONTRAST:  100mL [L9] IOPAMIDOL ([L9]) INJECTION 61%

[Series 12: neck 2.00 br40 s3 axial soft · axial · 0.51mm/px · z∈[-666,-612]mm · 2 of 108 slices shown]
[im 27/108  soft-tissue]
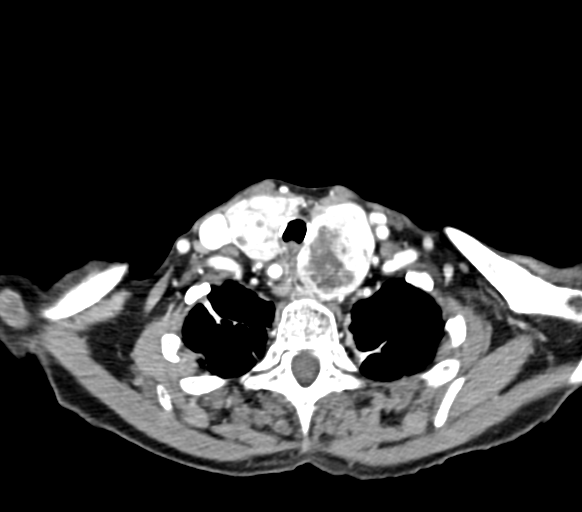
[im 54/108  soft-tissue]
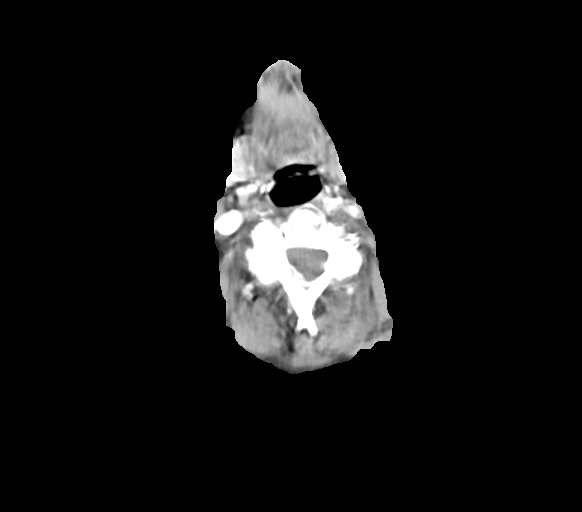

[Series 16: neck 2.00 br40 s3 angled axial (person_name) · axial · 0.51mm/px · z∈[-676,-548]mm · 4 of 108 slices shown, 5 images]
[im 22/108  soft-tissue]
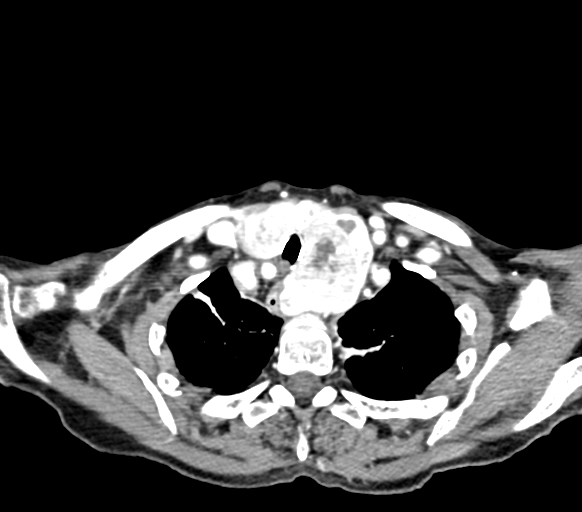
[im 22/108  bone]
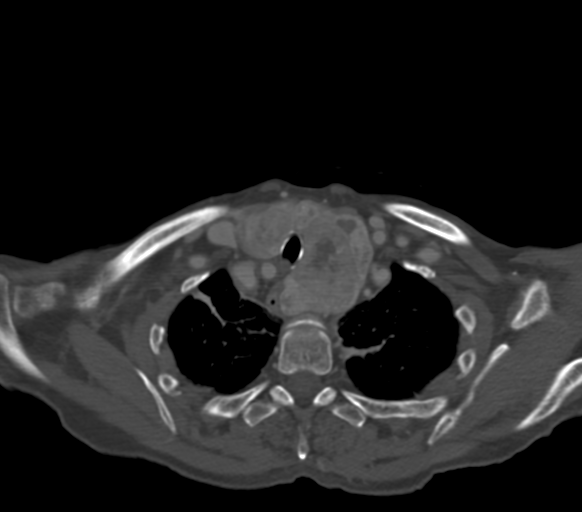
[im 43/108  bone]
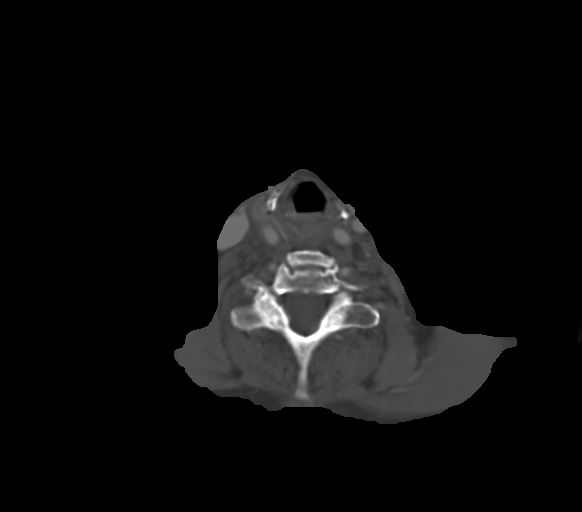
[im 65/108  bone]
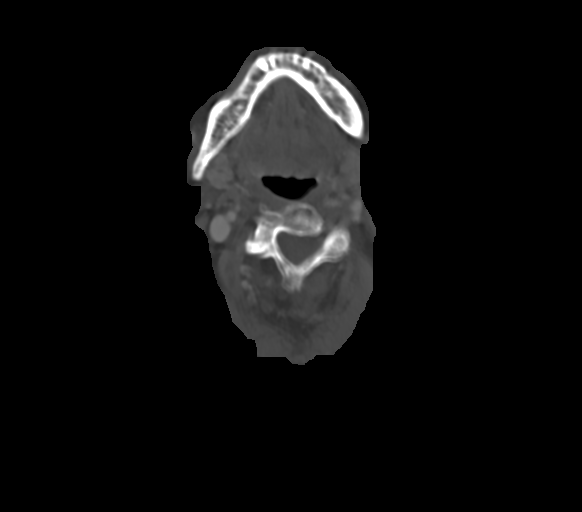
[im 86/108  bone]
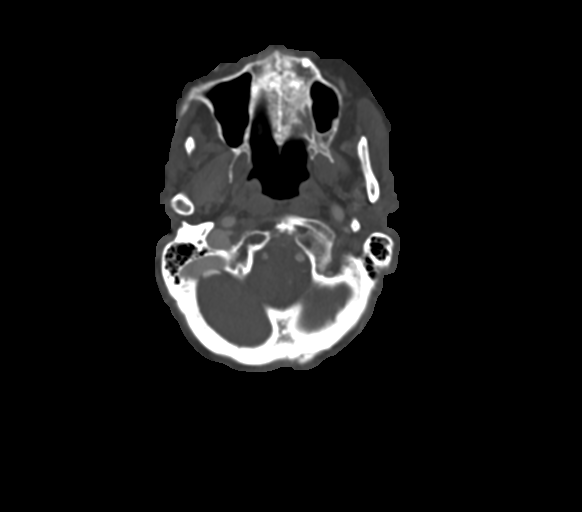

[Series 18: neck 2.00 br40 s3 angled (person_name) · coronal · 0.42mm/px · 3 of 129 slices shown]
[im 26/129  bone]
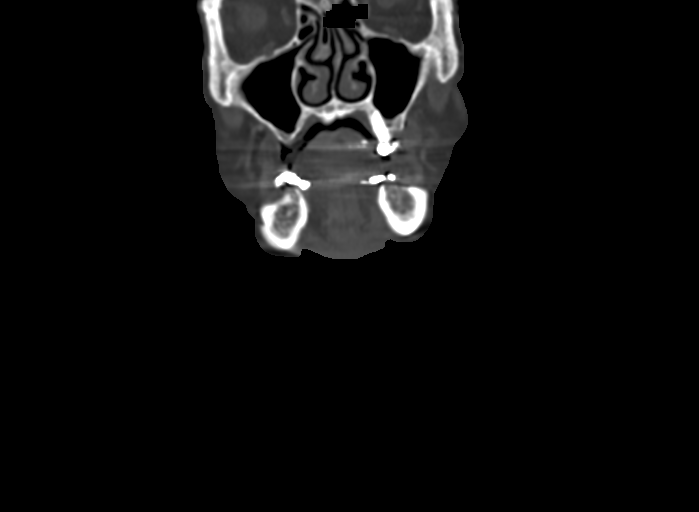
[im 52/129  bone]
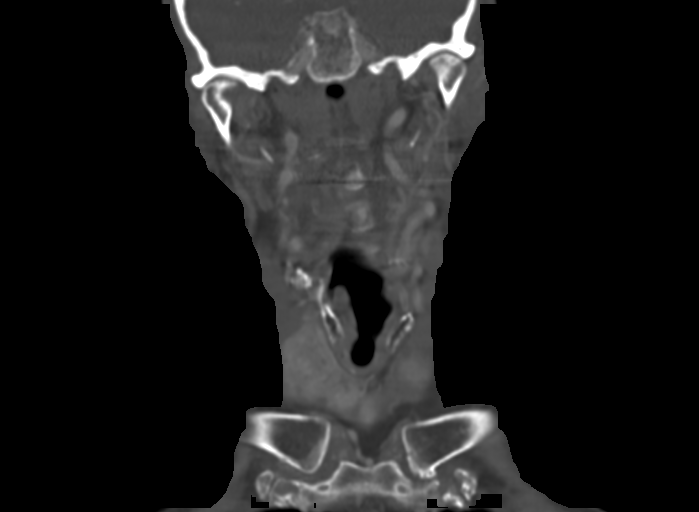
[im 77/129  bone]
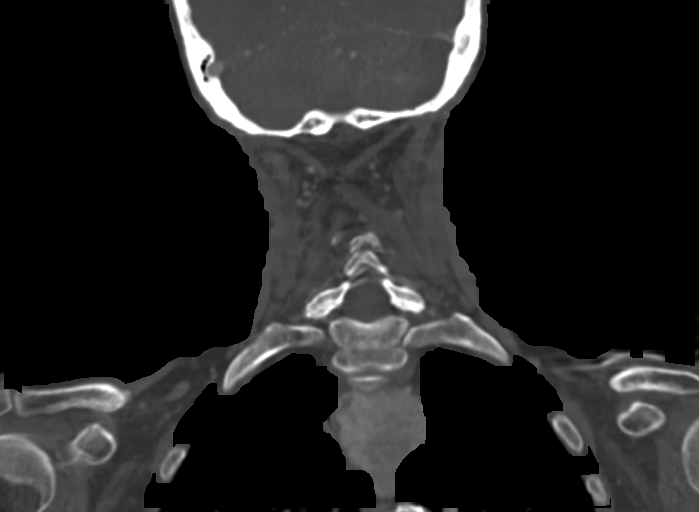

[Series 20: neck 2.00 br40 s3 sag (person_name) · sagittal · 0.42mm/px · 5 of 147 slices shown, 6 images]
[im 49/147  bone]
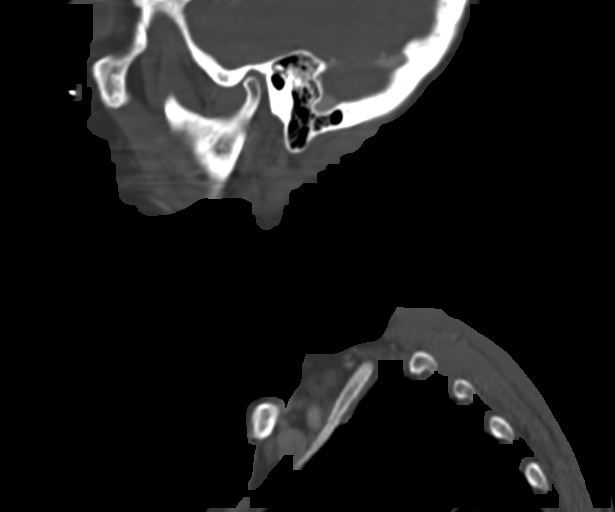
[im 61/147  bone]
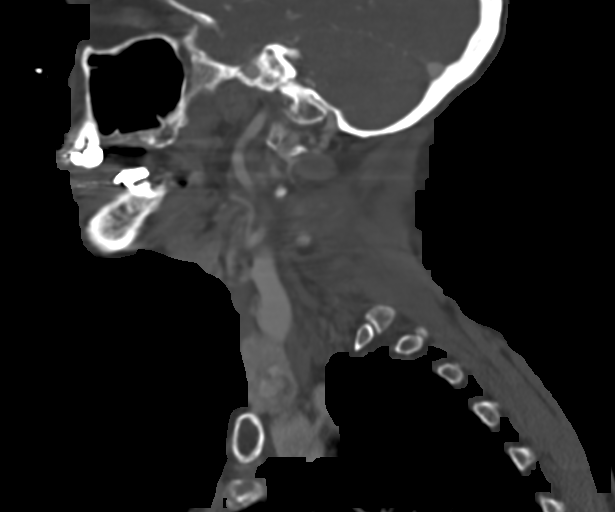
[im 74/147  soft-tissue]
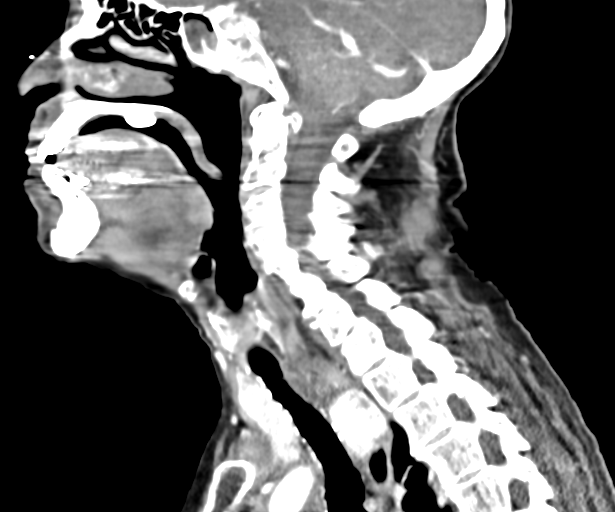
[im 74/147  bone]
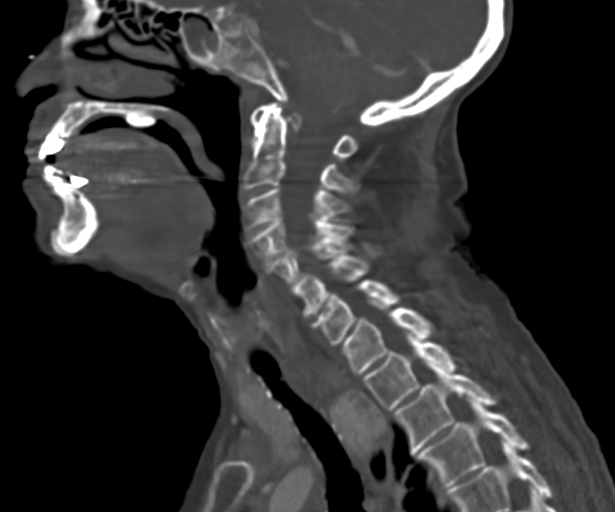
[im 86/147  bone]
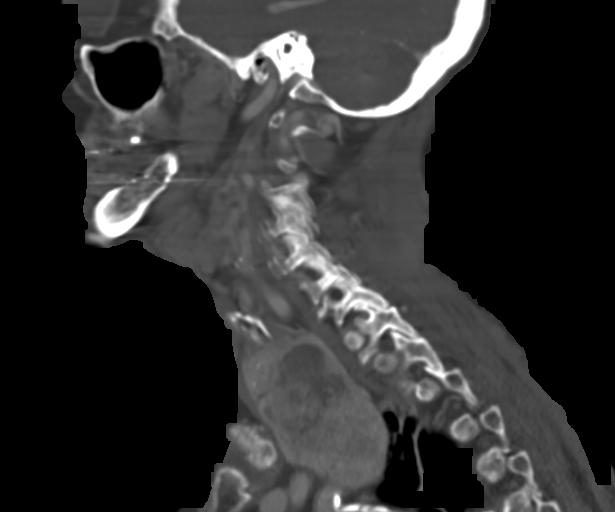
[im 98/147  bone]
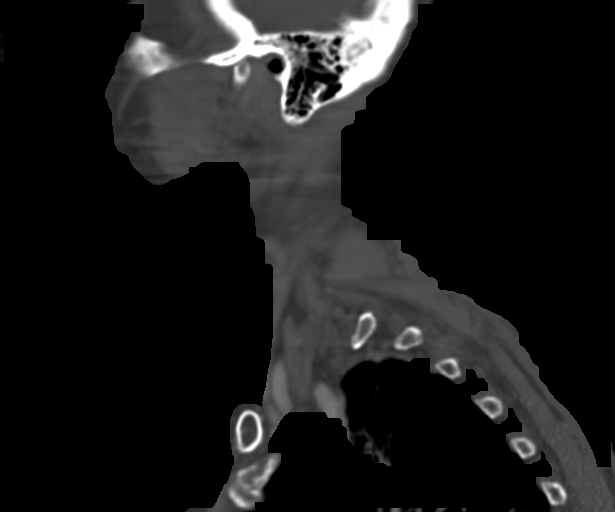

[14 of 33 positions shown; findings below may reference images not displayed]

FINDINGS: The study is degraded by motion.

Pharynx and larynx: Normal. No mass or swelling.

Salivary glands: No inflammation, mass, or stone.

Thyroid: Prominently enlarged multinodular thyroid, particularly the
left lobe, extending into the superior mediastinum and causing
displacement of the trachea and superior esophagus to the right.
Dominant necrotic nodule in the left lobe measuring 5.1 x 3.3 x
cm.

Lymph nodes: None enlarged or abnormal density.

Vascular: Calcified plaques in the aortic arch. Calcified plaques in
the bilateral carotid bifurcation. Evaluation for degree of stenosis
precluded from motion artifact at this level.

Limited intracranial: Decreased volume of the left cerebellar
hemisphere with large developmental venous anomaly and a 6 mm
enhancing focus.

Visualized orbits: Bilateral lens surgery.

Mastoids and visualized paranasal sinuses: Opacification of the left
sphenoid sinus. Mastoids are clear.

Skeleton: Degenerative changes of the cervical spine left
temporomandibular. No aggressive bone lesion.

Upper chest: Biapical pleural-parenchymal scaring.

Other: None.
IMPRESSION: 1. Prominently enlarged multinodular thyroid, particularly the left
lobe, extending into the superior mediastinum and causing
displacement of the trachea and superior esophagus to the right.
Dominant necrotic nodule in the left lobe measuring up to 5.1 cm.
2. Decreased volume of the left cerebellar hemisphere with large
developmental venous anomaly and a 6 mm enhancing focus. Correlate
with brain MRI if clinically indicated.

Aortic Atherosclerosis ([L9]-[L9]).

## 2020-05-26 IMAGING — CT CT ABD-PELV W/ CM
2 of 5 series · 12 of 46 positions shown, 14 images · IV contrast (iopamidol)
Comparison: None.

CLINICAL DATA: Abdominal pain, diarrhea.

EXAM:
CT ABDOMEN AND PELVIS WITH CONTRAST
TECHNIQUE: Multidetector CT imaging of the abdomen and pelvis was performed
using the standard protocol following bolus administration of
intravenous contrast.
CONTRAST:  125mL [UB] IOPAMIDOL ([UB]) INJECTION 61%

[Series 3: cap with 5.00 br40 s3 axial · axial · 0.50mm/px · z∈[-1235,-845]mm · 9 of 90 slices shown, 11 images]
[im 6/90  soft-tissue]
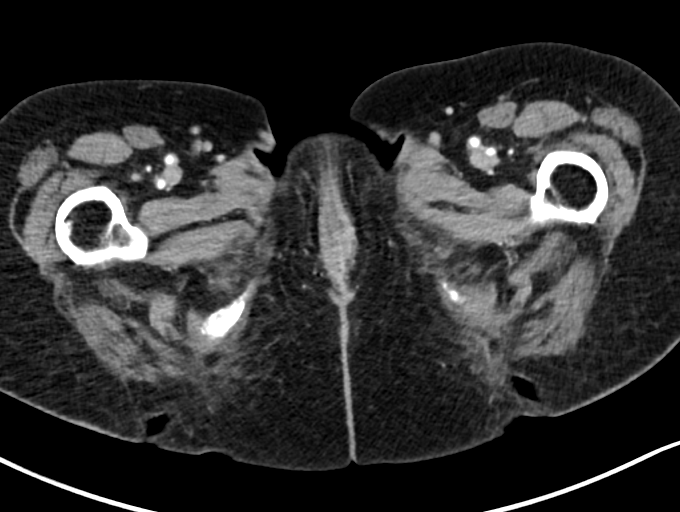
[im 6/90  bone]
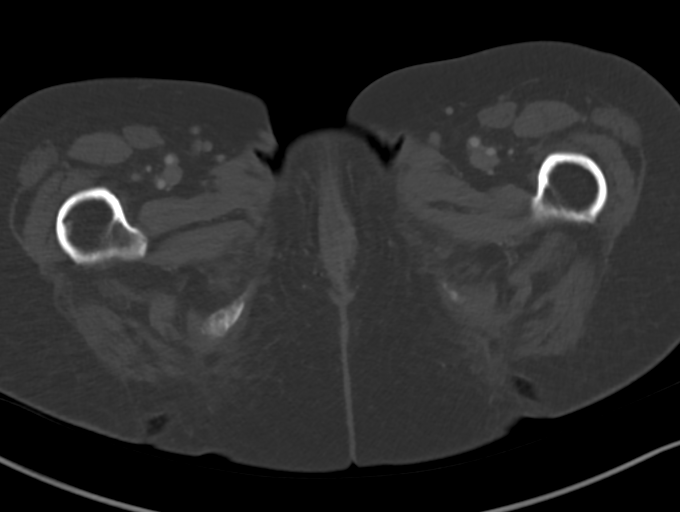
[im 18/90  soft-tissue]
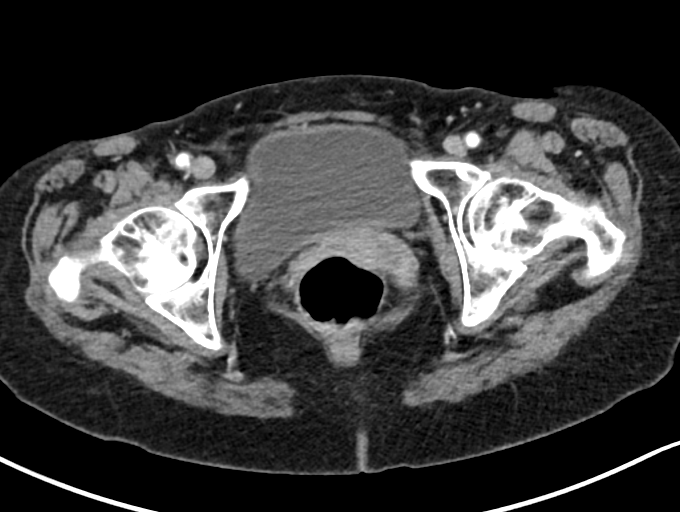
[im 24/90  soft-tissue]
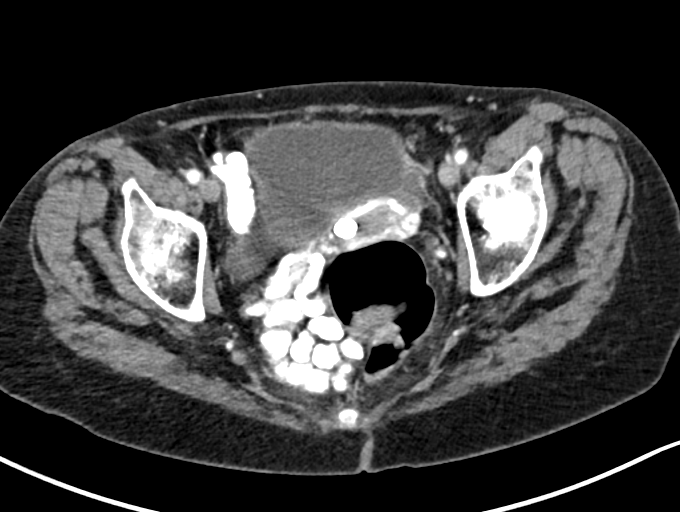
[im 36/90  soft-tissue]
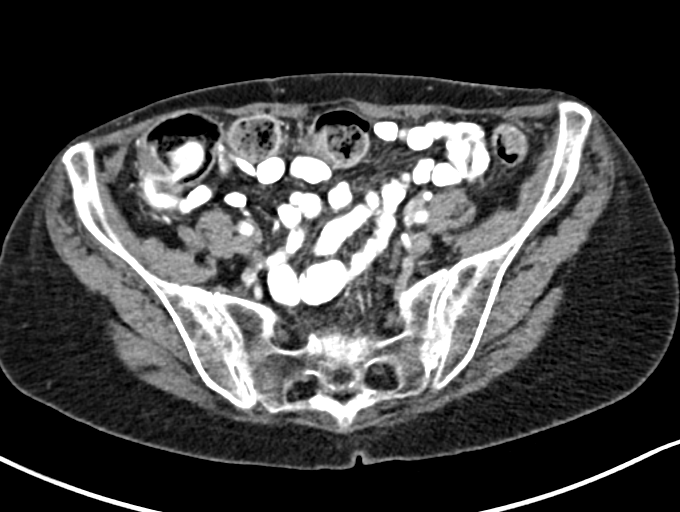
[im 48/90  soft-tissue]
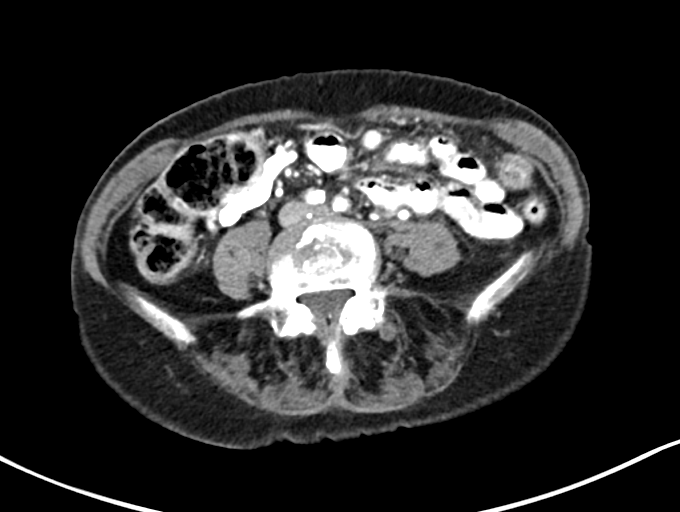
[im 54/90  soft-tissue]
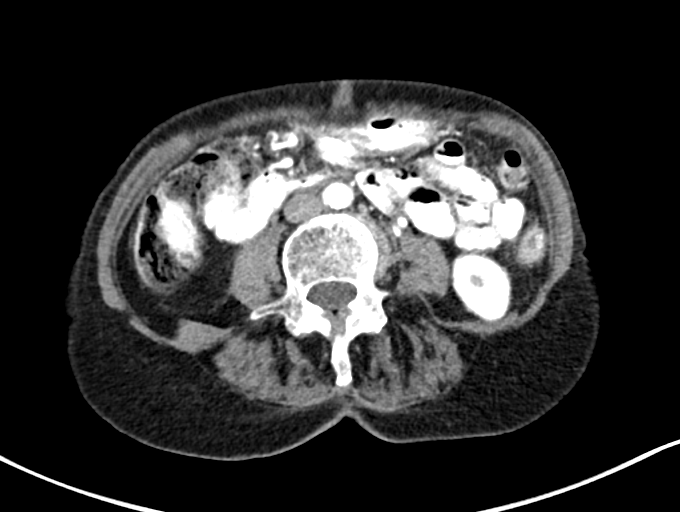
[im 66/90  soft-tissue]
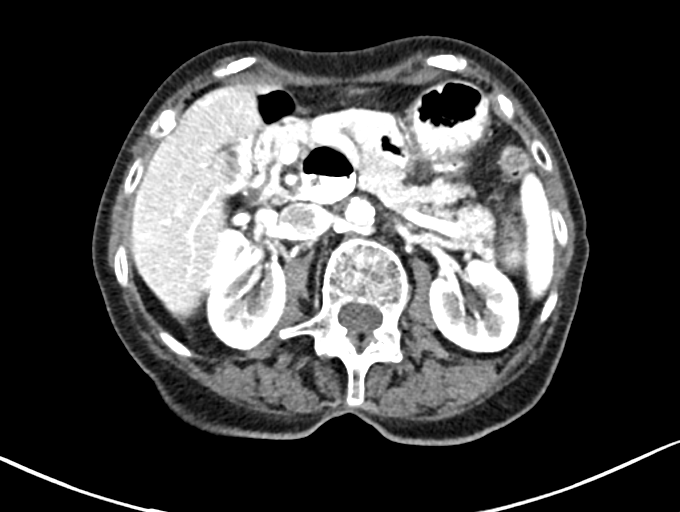
[im 72/90  soft-tissue]
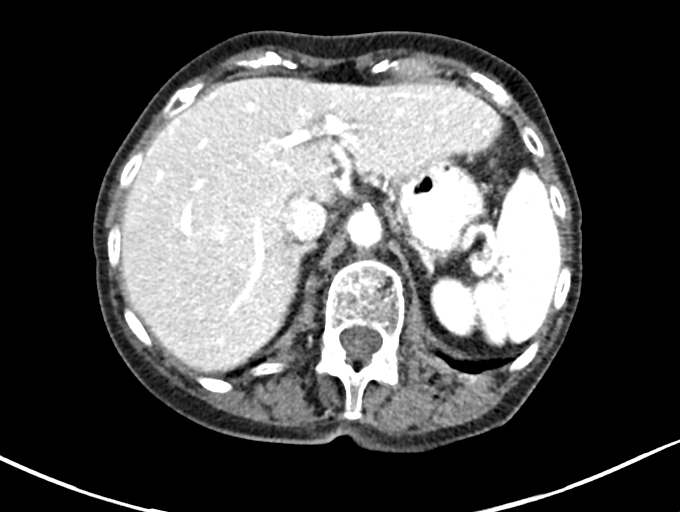
[im 84/90  soft-tissue]
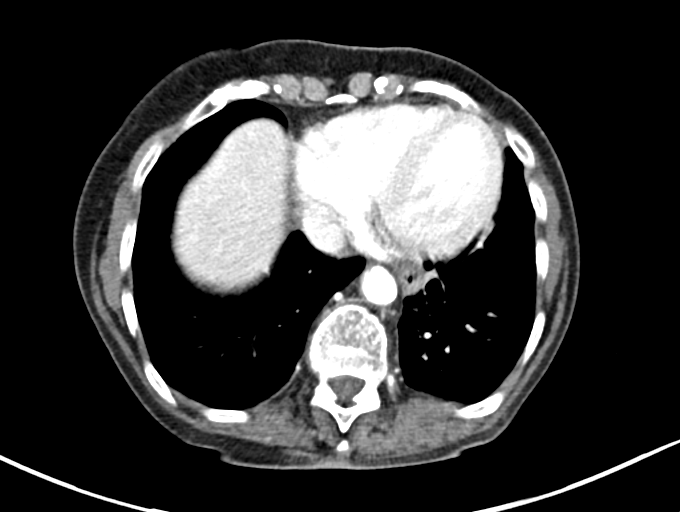
[im 84/90  bone]
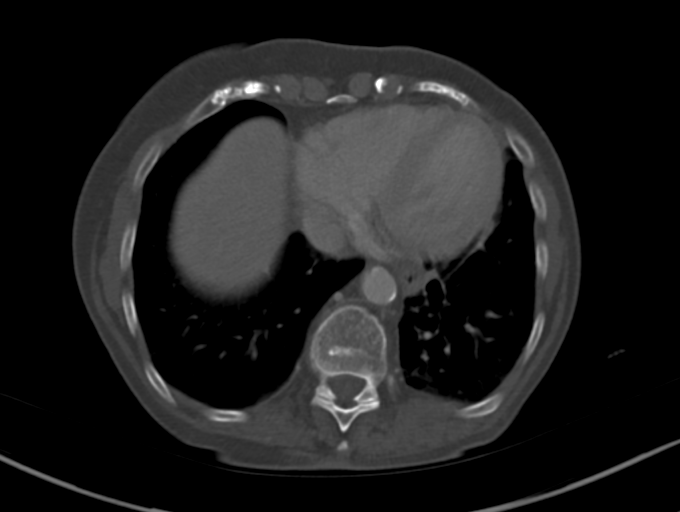

[Series 7: cap with 2.00 br40 s3 cor · coronal · 0.67mm/px · 3 of 128 slices shown]
[im 43/128  soft-tissue]
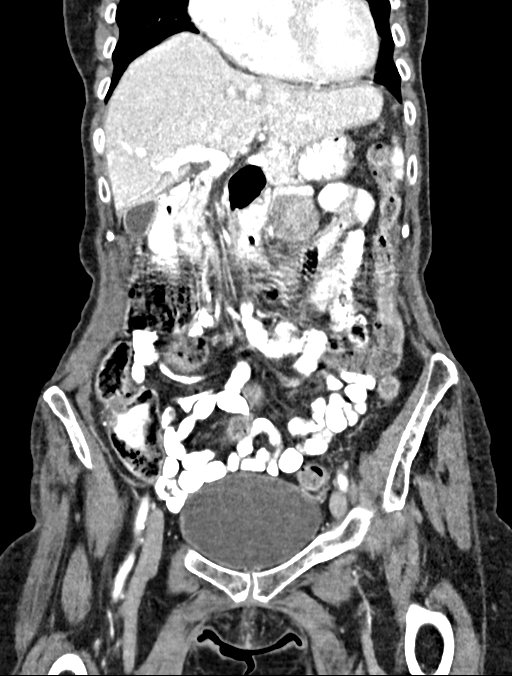
[im 57/128  soft-tissue]
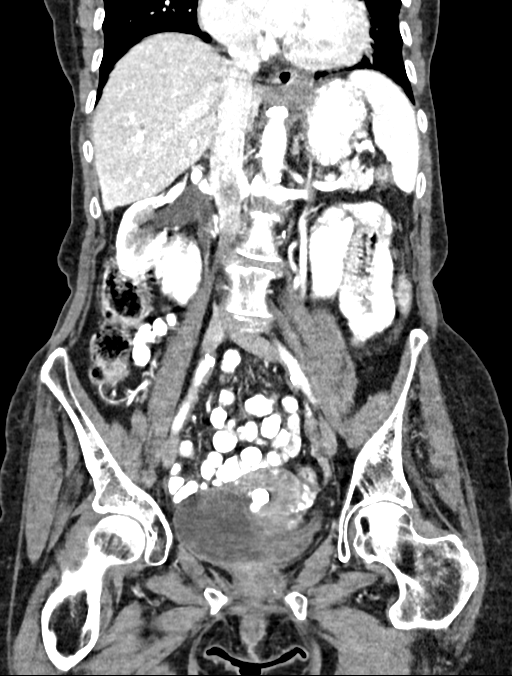
[im 71/128  soft-tissue]
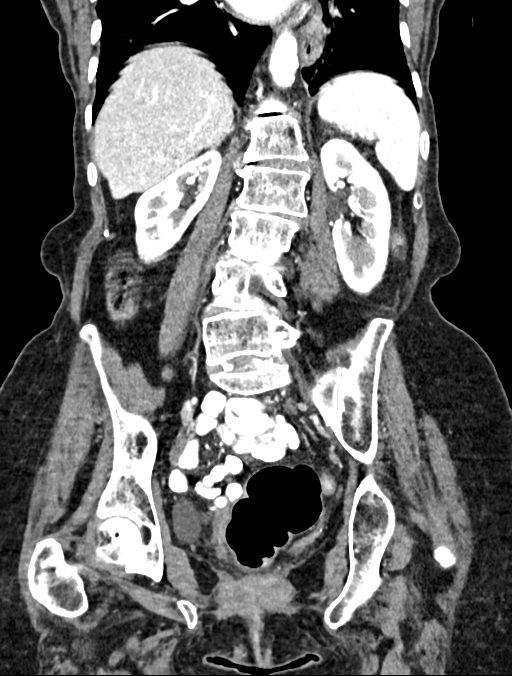

[12 of 46 positions shown; findings below may reference images not displayed]

FINDINGS: Lower chest: Lung bases show subsegmental atelectasis. Heart is
enlarged. No pericardial or pleural effusion. Distal esophagus is
unremarkable.

Hepatobiliary: Liver and gallbladder are unremarkable. No biliary
ductal dilatation.

Pancreas: Negative.

Spleen: Negative.

Adrenals/Urinary Tract: Adrenal glands and kidneys are unremarkable.
Pelvicaliectasis bilaterally, right greater than left. Ureters are
decompressed. Bladder is unremarkable.

Stomach/Bowel: There may be a small hiatal hernia. A heterogeneous
mass measuring 3.4 x 3.8 cm is seen along the lesser curvature of
the stomach (3/90 and coronal image 36). Stomach, small bowel and
colon are otherwise unremarkable. Appendix is reportedly absent.

Vascular/Lymphatic: Atherosclerotic calcification of the aorta
without aneurysm. Retroaortic left renal vein. Scattered lymph nodes
are not enlarged by CT size criteria.

Reproductive: Uterus is visualized and contains a calcified
fibroid. No adnexal mass.

Other: No free fluid. Mesenteries and peritoneum are otherwise
unremarkable.

Musculoskeletal: Degenerative changes in the spine. No worrisome
lytic or sclerotic lesions.
IMPRESSION: 1. Heterogeneous mass along the lesser curvature of the stomach may
represent a GI stromal tumor or lymphoma.
2.  Aortic atherosclerosis ([UB]-[UB]).

## 2020-05-26 MED ORDER — IOPAMIDOL (ISOVUE-300) INJECTION 61%
125.0000 mL | Freq: Once | INTRAVENOUS | Status: AC | PRN
  Administered 2020-05-26: 100 mL via INTRAVENOUS

## 2020-05-31 ENCOUNTER — Encounter: Payer: Self-pay | Admitting: Gastroenterology

## 2020-05-31 ENCOUNTER — Inpatient Hospital Stay: Admission: RE | Admit: 2020-05-31 | Payer: Medicare Other | Source: Ambulatory Visit

## 2020-06-17 ENCOUNTER — Other Ambulatory Visit: Payer: Self-pay | Admitting: Family Medicine

## 2020-06-17 DIAGNOSIS — I471 Supraventricular tachycardia: Secondary | ICD-10-CM

## 2020-06-17 DIAGNOSIS — F341 Dysthymic disorder: Secondary | ICD-10-CM

## 2020-06-18 ENCOUNTER — Ambulatory Visit (AMBULATORY_SURGERY_CENTER): Payer: Self-pay

## 2020-06-18 ENCOUNTER — Other Ambulatory Visit: Payer: Self-pay

## 2020-06-18 VITALS — Ht 65.0 in | Wt 125.0 lb

## 2020-06-18 DIAGNOSIS — R197 Diarrhea, unspecified: Secondary | ICD-10-CM

## 2020-06-18 DIAGNOSIS — R1030 Lower abdominal pain, unspecified: Secondary | ICD-10-CM

## 2020-06-18 DIAGNOSIS — R194 Change in bowel habit: Secondary | ICD-10-CM

## 2020-06-18 NOTE — Progress Notes (Signed)
No allergies to soy or egg Pt is not on blood thinners or diet pills Denies issues with sedation/intubation Denies atrial flutter/fib Denies constipation   Emmi instructions given to pt  Pt is aware of Covid safety and care partner requirements.  

## 2020-06-19 ENCOUNTER — Encounter: Payer: Self-pay | Admitting: Family Medicine

## 2020-06-23 ENCOUNTER — Ambulatory Visit (INDEPENDENT_AMBULATORY_CARE_PROVIDER_SITE_OTHER): Payer: Medicare Other | Admitting: Family Medicine

## 2020-06-23 ENCOUNTER — Encounter: Payer: Self-pay | Admitting: Family Medicine

## 2020-06-23 ENCOUNTER — Ambulatory Visit: Payer: Medicare Other | Admitting: Family Medicine

## 2020-06-23 ENCOUNTER — Other Ambulatory Visit: Payer: Self-pay

## 2020-06-23 VITALS — BP 174/60 | HR 68 | Temp 98.0°F | Ht 65.0 in | Wt 122.4 lb

## 2020-06-23 DIAGNOSIS — E782 Mixed hyperlipidemia: Secondary | ICD-10-CM

## 2020-06-23 DIAGNOSIS — I6529 Occlusion and stenosis of unspecified carotid artery: Secondary | ICD-10-CM | POA: Diagnosis not present

## 2020-06-23 DIAGNOSIS — I10 Essential (primary) hypertension: Secondary | ICD-10-CM | POA: Diagnosis not present

## 2020-06-23 LAB — COMPLETE METABOLIC PANEL WITH GFR
AG Ratio: 1.7 (calc) (ref 1.0–2.5)
ALT: 13 U/L (ref 6–29)
AST: 20 U/L (ref 10–35)
Albumin: 4.3 g/dL (ref 3.6–5.1)
Alkaline phosphatase (APISO): 80 U/L (ref 37–153)
BUN: 12 mg/dL (ref 7–25)
CO2: 29 mmol/L (ref 20–32)
Calcium: 10.9 mg/dL — ABNORMAL HIGH (ref 8.6–10.4)
Chloride: 103 mmol/L (ref 98–110)
Creat: 0.62 mg/dL (ref 0.60–0.88)
GFR, Est African American: 89 mL/min/{1.73_m2} (ref >=60)
GFR, Est Non African American: 77 mL/min/{1.73_m2} (ref >=60)
Globulin: 2.6 g/dL (calc) (ref 1.9–3.7)
Glucose, Bld: 109 mg/dL — ABNORMAL HIGH (ref 65–99)
Potassium: 4.5 mmol/L (ref 3.5–5.3)
Sodium: 139 mmol/L (ref 135–146)
Total Bilirubin: 0.5 mg/dL (ref 0.2–1.2)
Total Protein: 6.9 g/dL (ref 6.1–8.1)

## 2020-06-23 LAB — CBC WITH DIFFERENTIAL/PLATELET
Absolute Monocytes: 490 cells/uL (ref 200–950)
Basophils Absolute: 28 cells/uL (ref 0–200)
Basophils Relative: 0.4 %
Eosinophils Absolute: 99 cells/uL (ref 15–500)
Eosinophils Relative: 1.4 %
HCT: 39.9 % (ref 35.0–45.0)
Hemoglobin: 13.3 g/dL (ref 11.7–15.5)
Lymphs Abs: 1512 cells/uL (ref 850–3900)
MCH: 31.7 pg (ref 27.0–33.0)
MCHC: 33.3 g/dL (ref 32.0–36.0)
MCV: 95 fL (ref 80.0–100.0)
MPV: 9.9 fL (ref 7.5–12.5)
Monocytes Relative: 6.9 %
Neutro Abs: 4970 cells/uL (ref 1500–7800)
Neutrophils Relative %: 70 %
Platelets: 223 10*3/uL (ref 140–400)
RBC: 4.2 10*6/uL (ref 3.80–5.10)
RDW: 11.8 % (ref 11.0–15.0)
Total Lymphocyte: 21.3 %
WBC: 7.1 10*3/uL (ref 3.8–10.8)

## 2020-06-23 LAB — LIPID PANEL
Cholesterol: 158 mg/dL (ref <200)
HDL: 57 mg/dL (ref >=50)
LDL Cholesterol (Calc): 77 mg/dL (calc)
Non-HDL Cholesterol (Calc): 101 mg/dL (calc) (ref <130)
Total CHOL/HDL Ratio: 2.8 (calc) (ref <5.0)
Triglycerides: 139 mg/dL (ref <150)

## 2020-06-23 NOTE — Patient Instructions (Addendum)
Her blood pressure is much more elevated than normal.   1) start a blood pressure log for me and email me in a few weeks. If still high we will start very low dose medication.   2) keep me updated on her stomach biopsy.Marland Kitchen   3) so good seeing you! Happy thanksgiving and merry christmas!   Ill look into records regarding her carotid stenosis as well.   :) Dr .Artis Flock

## 2020-06-23 NOTE — Progress Notes (Signed)
157/65PatientDevri [name] MRN: [id number] DOB: 04-Sep-1923 PCP: Orland Mustard, MD     Subjective:  Chief Complaint  Patient presents with  . Hypertension  . Hyperlipidemia    HPI: The patient is a 86 y.o. female who presents today for HTN, and Hyperlipidemia. Pt is concerned about mass in her abdomen. Daughter says that she has an upcoming appointment with GI.   Hypertension: Here for follow up of hypertension.  Currently on no medication. Exercise includes none. Weight has been stable. Denies any chest pain, headaches, shortness of breath, vision changes, swelling in lower extremities. She states they do not check her BP at her assisted living.   Hyperlipidemia Currently on crestor 20mg /day. Takes as directed, no issues.   Carotid artery stenosis.  I do not have imaging of this. She is on daily ASA and statin.   CT of abdomen shows a 3.4x3.8cm mass along lesser curvature of the stomach. Has endoscopy scheduled 11/22. She states she has no pain eating in her stomach. Denies any nausea/vomiting. Denies any dysphagia type symptoms. Has no desire to eat because she thinks the food tastes bad at her AL. Has lost some weight, but looking back at her previous office visits, she was 118 in July of 2020 so she has actually put a few pounds on. Still has looser stool about twice a day. Denies any blood in stool.    Review of Systems  Constitutional: Negative for chills, fatigue, fever and unexpected weight change.  Eyes: Negative for visual disturbance.  Respiratory: Positive for cough. Negative for shortness of breath.   Cardiovascular: Negative for chest pain and palpitations.  Gastrointestinal: Positive for diarrhea. Negative for abdominal pain, blood in stool, constipation, nausea and vomiting.  Neurological: Negative for dizziness and headaches.    Allergies Patient has No Known Allergies.  Past Medical History Patient  has a past medical history of Arthritis, COPD (chronic  obstructive pulmonary disease) (HCC), Glaucoma, Hyperlipidemia, Hypertension, Irregular heartbeat, Osteoporosis, and Thyroid disease.  Surgical History Patient  has a past surgical history that includes Appendectomy; Colonoscopy; cateract surgery; Wrist surgery; and Upper gastrointestinal endoscopy (2019).  Family History Pateint's family history includes Heart disease in her brother, brother, father, and mother.  Social History Patient  reports that she quit smoking about 69 years ago. Her smoking use included cigarettes. She started smoking about 71 years ago. She has a 0.50 pack-year smoking history. She has never used smokeless tobacco. She reports previous alcohol use. She reports that she does not use drugs.    Objective: Vitals:   06/23/20 0931 06/23/20 1013  BP: (!) 157/65 (!) 174/60  Pulse: 68   Temp: 98 F (36.7 C)   TempSrc: Temporal   SpO2: 96%   Weight: 122 lb 6.4 oz (55.5 kg)   Height: 5\' 5"  (1.651 m)     Body mass index is 20.37 kg/m.  Physical Exam Vitals reviewed.  Constitutional:      General: She is not in acute distress.    Appearance: Normal appearance. She is not ill-appearing.  HENT:     Head: Normocephalic and atraumatic.  Cardiovascular:     Rate and Rhythm: Normal rate and regular rhythm.  Pulmonary:     Effort: Pulmonary effort is normal.     Breath sounds: Normal breath sounds.  Abdominal:     General: Abdomen is flat. Bowel sounds are normal.     Palpations: Abdomen is soft.  Neurological:     Mental Status: She is alert.  Assessment/plan: 1. Essential hypertension Blood pressure quite a bit elevated on repeat and even initial reading today. She is asymptomatic. Discussed with her daughter keeping a log vs. Starting a low dose medication today. We are going to have her daughter keep a log for me over the next few weeks and she will email me readings. Routine lab work today. F/u in 3 months and sooner if needed if we start  medication.  - Lipid panel; Future - CBC with Differential/Platelet; Future - Microalbumin / creatinine urine ratio; Future - COMPLETE METABOLIC PANEL WITH GFR; Future - COMPLETE METABOLIC PANEL WITH GFR - CBC with Differential/Platelet - Lipid panel  2. Stenosis of carotid artery, unspecified laterality Can not find any previous imaging in her chart from prior physician. On ASA and statin. Discussed imaging her again, but we will wait until after her endoscopy and get the tumor in her stomach sorted.   3. Mixed hyperlipidemia Continue statin. No side effects.   -reviewed media/records of chart. Only found past labs.   This visit occurred during the SARS-CoV-2 public health emergency.  Safety protocols were in place, including screening questions prior to the visit, additional usage of staff PPE, and extensive cleaning of exam room while observing appropriate contact time as indicated for disinfecting solutions.     Return in about 3 months (around 09/23/2020) for blood pressure .   Orland Mustard, MD [facility]   06/23/2020

## 2020-06-25 ENCOUNTER — Encounter: Payer: Self-pay | Admitting: Family Medicine

## 2020-06-25 ENCOUNTER — Ambulatory Visit (INDEPENDENT_AMBULATORY_CARE_PROVIDER_SITE_OTHER): Payer: Medicare Other | Admitting: Family Medicine

## 2020-06-25 ENCOUNTER — Ambulatory Visit: Payer: Medicare Other | Admitting: Family Medicine

## 2020-06-25 ENCOUNTER — Other Ambulatory Visit: Payer: Self-pay

## 2020-06-25 DIAGNOSIS — M1712 Unilateral primary osteoarthritis, left knee: Secondary | ICD-10-CM | POA: Diagnosis not present

## 2020-06-25 DIAGNOSIS — M1711 Unilateral primary osteoarthritis, right knee: Secondary | ICD-10-CM | POA: Diagnosis not present

## 2020-06-25 NOTE — Progress Notes (Signed)
Office Visit Note   Patient: Linda [name]           Date of Birth: 1923/09/24           MRN: [id number] Visit Date: 06/25/2020 Requested by: Orland Mustard, MD [address] [place],  [place] [address] PCP: Orland Mustard, MD  Subjective: No chief complaint on file.   HPI: She is here with recurrent bilateral knee pain.  Injections are helping for a couple months.  She would like to try them again.  Left knee hurts more than the right knee.              ROS:   All other systems were reviewed and are negative.  Objective: Vital Signs: There were no vitals taken for this visit.  Physical Exam:  General:  Alert and oriented, in no acute distress. Pulm:  Breathing unlabored. Psy:  Normal mood, congruent affect.  Knees: 1+ effusion on the left, trace on the right.  No warmth or erythema.  Imaging: No results found.  Assessment & Plan: 1.  Bilateral knee osteoarthritis -Steroid injections today.  Follow-up as needed.     Procedures: Bilateral knee steroid injections: After sterile prep with Betadine, injected 5 cc 1% lidocaine without epinephrine and 40 mg methylprednisolone from superolateral approach into each knee, a flash of clear yellow synovial fluid was obtained prior to injections.    PMFS History: Patient Active Problem List   Diagnosis Date Noted  . Bronchiectasis without acute exacerbation (HCC) 07/14/2019  . Chronic cough 02/03/2019  . Thoracic back pain 10/13/2018  . Hyperlipidemia 10/13/2018  . Vitamin D deficiency 10/13/2018  . Gastroesophageal reflux disease without esophagitis 10/13/2018  . Chronic midline thoracic back pain 10/13/2018  . Osteoporosis without current pathological fracture 10/13/2018  . Essential hypertension 10/13/2018  . Stenosis of carotid artery 10/13/2018  . History of thyroid nodule, s/p bx, benign 10/13/2018  . Aortic cusp regurgitation 10/13/2018  . Nonrheumatic tricuspid valve regurgitation 10/13/2018  . Coccyalgia  10/13/2018  . Assistance needed with transportation, no longer drives due to vision 10/13/2018  . Irritable bowel syndrome 10/13/2018  . Bilateral hearing loss 10/13/2018  . Chronic rhinitis 10/13/2018  . Paroxysmal atrial tachycardia (HCC) 10/13/2018   Past Medical History:  Diagnosis Date  . Arthritis   . COPD (chronic obstructive pulmonary disease) (HCC)    chronic cough  . Glaucoma   . Hyperlipidemia   . Hypertension   . Irregular heartbeat   . Osteoporosis   . Thyroid disease    enlarged thyroid    Family History  Problem Relation Age of Onset  . Heart disease Mother   . Heart disease Father   . Heart disease Brother   . Heart disease Brother   . Colon cancer Neg Hx   . Colon polyps Neg Hx   . Esophageal cancer Neg Hx     Past Surgical History:  Procedure Laterality Date  . APPENDECTOMY    . cateract surgery    . COLONOSCOPY     Done in [place]  yrs ago  . UPPER GASTROINTESTINAL ENDOSCOPY  2019  . WRIST SURGERY     Social History   Occupational History  . Occupation: Retired  Tobacco Use  . Smoking status: Former Smoker    Packs/day: 0.25    Years: 2.00    Pack years: 0.50    Types: Cigarettes    Start date: 4    Quit date: 1952    Years since quitting: 69.9  .  Smokeless tobacco: Never Used  Vaping Use  . Vaping Use: Never used  Substance and Sexual Activity  . Alcohol use: Not Currently  . Drug use: Never  . Sexual activity: Not on file

## 2020-07-05 ENCOUNTER — Other Ambulatory Visit: Payer: Self-pay

## 2020-07-05 ENCOUNTER — Ambulatory Visit (AMBULATORY_SURGERY_CENTER): Payer: Medicare Other | Admitting: Gastroenterology

## 2020-07-05 ENCOUNTER — Encounter: Payer: Self-pay | Admitting: Gastroenterology

## 2020-07-05 VITALS — BP 170/79 | HR 66 | Temp 96.9°F | Resp 13 | Ht 65.0 in | Wt 125.0 lb

## 2020-07-05 DIAGNOSIS — K2930 Chronic superficial gastritis without bleeding: Secondary | ICD-10-CM | POA: Diagnosis not present

## 2020-07-05 DIAGNOSIS — R933 Abnormal findings on diagnostic imaging of other parts of digestive tract: Secondary | ICD-10-CM

## 2020-07-05 DIAGNOSIS — K2100 Gastro-esophageal reflux disease with esophagitis, without bleeding: Secondary | ICD-10-CM

## 2020-07-05 DIAGNOSIS — K2931 Chronic superficial gastritis with bleeding: Secondary | ICD-10-CM

## 2020-07-05 DIAGNOSIS — K3189 Other diseases of stomach and duodenum: Secondary | ICD-10-CM

## 2020-07-05 MED ORDER — SODIUM CHLORIDE 0.9 % IV SOLN: 500.0000 mL | Freq: Once | INTRAVENOUS | Status: DC

## 2020-07-05 NOTE — Op Note (Signed)
[facility] Patient Name: Linda [name] Procedure Date: 07/05/2020 10:28 AM MRN: [id number] Endoscopist: Napoleon Form , MD Age: 86 Referring MD:  Date of Birth: [birth year] Gender: Female Account #: [id number] Procedure:                Upper GI endoscopy Indications:              Endoscopy to confirm suspected neoplastic lesion of                            the stomach seen on previous imaging study,                            Abnormal CT of the GI tract Medicines:                Monitored Anesthesia Care Procedure:                Pre-Anesthesia Assessment:                           - Prior to the procedure, a History and Physical                            was performed, and patient medications and                            allergies were reviewed. The patient's tolerance of                            previous anesthesia was also reviewed. The risks                            and benefits of the procedure and the sedation                            options and risks were discussed with the patient.                            All questions were answered, and informed consent                            was obtained. Prior Anticoagulants: The patient has                            taken no previous anticoagulant or antiplatelet                            agents. ASA Grade Assessment: III - A patient with                            severe systemic disease. After reviewing the risks                            and benefits, the patient was deemed in  satisfactory condition to undergo the procedure.                           After obtaining informed consent, the endoscope was                            passed under direct vision. Throughout the                            procedure, the patient's blood pressure, pulse, and                            oxygen saturations were monitored continuously. The                            Endoscope was  introduced through the mouth, and                            advanced to the second part of duodenum. The upper                            GI endoscopy was accomplished without difficulty.                            The patient tolerated the procedure well. Scope In: Scope Out: Findings:                 The Z-line was regular and was found 35 cm from the                            incisors.                           LA Grade B (one or more mucosal breaks greater than                            5 mm, not extending between the tops of two mucosal                            folds) esophagitis with no bleeding was found 34 to                            35 cm from the incisors.                           No other gross lesions were noted in the entire                            esophagus.                           Patchy mild inflammation characterized by adherent                            blood,  congestion (edema), erosions and erythema                            was found in the gastric antrum, in the prepyloric                            region of the stomach and at the pylorus. Biopsies                            were taken with a cold forceps for Helicobacter                            pylori testing. Otherwise no mass lesions                           The cardia and gastric fundus were normal on                            retroflexion.                           The examined duodenum was normal. Complications:            No immediate complications. Estimated Blood Loss:     Estimated blood loss was minimal. Impression:               - Z-line regular, 35 cm from the incisors.                           - No gross lesions in esophagus.                           - Erosive gastritis. Biopsied.                           - Normal examined duodenum. Recommendation:           - Patient has a contact number available for                            emergencies. The signs and symptoms of potential                             delayed complications were discussed with the                            patient. Return to normal activities tomorrow.                            Written discharge instructions were provided to the                            patient.                           - Resume previous diet.                           -  Continue present medications.                           - Await pathology results.                           - No high dose aspirin, ibuprofen, naproxen, or                            other non-steroidal anti-inflammatory drugs.                           - Follow an antireflux regimen. Napoleon Form, MD 07/05/2020 10:43:31 AM This report has been signed electronically.

## 2020-07-05 NOTE — Progress Notes (Signed)
C.W. vital signs. 

## 2020-07-05 NOTE — Progress Notes (Signed)
pt tolerated well. VSS. awake and to recovery. Report given to RN.  

## 2020-07-05 NOTE — Progress Notes (Signed)
Pt's states no medical or surgical changes since previsit or office visit. 

## 2020-07-05 NOTE — Progress Notes (Signed)
Called to room to assist during endoscopic procedure.  Patient ID and intended procedure confirmed with present staff. Received instructions for my participation in the procedure from the performing physician.  

## 2020-07-05 NOTE — Patient Instructions (Signed)
Resume previous diet Continue current medications Await pathology results NO HIGH DOSE ASPIRIN, IBUROFEN, NAPROXEN, OR OTHER NSAIDS Follow an antireflux regimen.  YOU HAD AN ENDOSCOPIC PROCEDURE TODAY AT THE [facility]:   Refer to the procedure report that was given to you for any specific questions about what was found during the examination.  If the procedure report does not answer your questions, please call your gastroenterologist to clarify.  If you requested that your care partner not be given the details of your procedure findings, then the procedure report has been included in a sealed envelope for you to review at your convenience later.  YOU SHOULD EXPECT: Some feelings of bloating in the abdomen. Passage of more gas than usual.  Walking can help get rid of the air that was put into your GI tract during the procedure and reduce the bloating. If you had a lower endoscopy (such as a colonoscopy or flexible sigmoidoscopy) you may notice spotting of blood in your stool or on the toilet paper. If you underwent a bowel prep for your procedure, you may not have a normal bowel movement for a few days.  Please Note:  You might notice some irritation and congestion in your nose or some drainage.  This is from the oxygen used during your procedure.  There is no need for concern and it should clear up in a day or so.  SYMPTOMS TO REPORT IMMEDIATELY:   Following upper endoscopy (EGD)  Vomiting of blood or coffee ground material  New chest pain or pain under the shoulder blades  Painful or persistently difficult swallowing  New shortness of breath  Fever of 100F or higher  Black, tarry-looking stools  For urgent or emergent issues, a gastroenterologist can be reached at any hour by calling [phone]. Do not use MyChart messaging for urgent concerns.   DIET:  We do recommend a small meal at first, but then you may proceed to your regular diet.  Drink plenty of fluids but you  should avoid alcoholic beverages for 24 hours.  ACTIVITY:  You should plan to take it easy for the rest of today and you should NOT DRIVE or use heavy machinery until tomorrow (because of the sedation medicines used during the test).    FOLLOW UP: Our staff will call the number listed on your records 48-72 hours following your procedure to check on you and address any questions or concerns that you may have regarding the information given to you following your procedure. If we do not reach you, we will leave a message.  We will attempt to reach you two times.  During this call, we will ask if you have developed any symptoms of COVID 19. If you develop any symptoms (ie: fever, flu-like symptoms, shortness of breath, cough etc.) before then, please call [phone].  If you test positive for Covid 19 in the 2 weeks post procedure, please call and report this information to [place].    If any biopsies were taken you will be contacted by phone or by letter within the next 1-3 weeks.  Please call us at [phone] if you have not heard about the biopsies in 3 weeks.   SIGNATURES/CONFIDENTIALITY: You and/or your care partner have signed paperwork which will be entered into your electronic medical record.  These signatures attest to the fact that that the information above on your After Visit Summary has been reviewed and is understood.  Full responsibility of the confidentiality of this discharge  information lies with you and/or your care-partner.

## 2020-07-06 ENCOUNTER — Telehealth: Payer: Self-pay | Admitting: *Deleted

## 2020-07-06 ENCOUNTER — Telehealth: Payer: Self-pay

## 2020-07-06 NOTE — Telephone Encounter (Signed)
  Follow up Call-  Call back number 07/05/2020  Post procedure Call Back phone  # [phone]  Permission to leave phone message Yes  Some recent data might be hidden    Daughter's number [facility]

## 2020-07-06 NOTE — Telephone Encounter (Signed)
  Follow up Call-  Call back number 07/05/2020  Post procedure Call Back phone  # [phone]  Permission to leave phone message Yes  Some recent data might be hidden     2nd follow up call made.  NALM

## 2020-07-26 ENCOUNTER — Other Ambulatory Visit: Payer: Self-pay | Admitting: Family Medicine

## 2020-07-26 ENCOUNTER — Encounter: Payer: Self-pay | Admitting: Gastroenterology

## 2020-08-04 ENCOUNTER — Ambulatory Visit (INDEPENDENT_AMBULATORY_CARE_PROVIDER_SITE_OTHER): Payer: Medicare Other | Admitting: Gastroenterology

## 2020-08-04 ENCOUNTER — Encounter: Payer: Self-pay | Admitting: Gastroenterology

## 2020-08-04 VITALS — BP 172/72 | HR 62 | Ht 65.0 in | Wt 121.0 lb

## 2020-08-04 DIAGNOSIS — R109 Unspecified abdominal pain: Secondary | ICD-10-CM | POA: Diagnosis not present

## 2020-08-04 DIAGNOSIS — K589 Irritable bowel syndrome without diarrhea: Secondary | ICD-10-CM | POA: Diagnosis not present

## 2020-08-04 DIAGNOSIS — I6529 Occlusion and stenosis of unspecified carotid artery: Secondary | ICD-10-CM

## 2020-08-04 DIAGNOSIS — R143 Flatulence: Secondary | ICD-10-CM

## 2020-08-04 DIAGNOSIS — R1032 Left lower quadrant pain: Secondary | ICD-10-CM | POA: Diagnosis not present

## 2020-08-04 NOTE — Progress Notes (Signed)
Linda [name]    [id number]    August 24, 1923  Primary Care Physician:Wolfe, Revonda Standard, MD  Referring Physician: Orland Mustard, MD [address] [place],  [place] [address]   Chief complaint: Abdominal discomfort, excessive gas, change in bowel habits  HPI: 86 year old very pleasant female here for follow-up visit accompanied by her daughter with complaints of change in bowel habits, excessive gas and abdominal discomfort  Abdominal discomfort and gas usually improves after she has a bowel movement She is taking Metamucil daily  She lives at an assisted living facility, meals are provided and she does not feel she has an option to request more fruits and vegetables, feels they are rich in sauces and gravy.  Denies any rectal bleeding, unintentional weight loss or decreased  Labs that were requested by PMD within normal range, stool fecal lactoferrin was negative.  Inflammatory markers were within normal limits.  CT abdomen and pelvis with contrast May 26, 2020: Heterogeneous mass along the lesser curvature of the stomach may represent GI stromal tumor or lymphoma  EGD July 05, 2020: Mild erosive gastritis, biopsies showed mild chronic gastritis with no H. pylori infection, dysplasia, intestinal metaplasia or malignancy.  No intramucosal lesion noted on endoscopic exam, otherwise rest of exam was unremarkable  Outpatient Encounter Medications as of 08/04/2020  Medication Sig  . acetaminophen (TYLENOL) 325 MG tablet Take 650 mg by mouth every 6 (six) hours as needed.  . Cholecalciferol (VITAMIN D3) 25 MCG (1000 UT) CAPS Take 1 capsule by mouth daily.  . clobetasol cream (TEMOVATE) 0.05 % APPLY 1 APPLICATION TOPICALLY AS NEEDED  . famotidine (PEPCID) 20 MG tablet TAKE 1 TABLET TWICE A DAY  . FLUAD 0.5 ML SUSY   . fluticasone (FLONASE) 50 MCG/ACT nasal spray USE 1 SPRAY IN EACH NOSTRIL DAILY AS NEEDED FOR ALLERGIES OR RHINITIS  . Multiple Vitamin (MULTIVITAMIN)  tablet Take 1 tablet by mouth daily.  . rosuvastatin (CRESTOR) 20 MG tablet Take 1 tablet (20 mg total) by mouth daily.  . sotalol (BETAPACE) 80 MG tablet TAKE 1 TABLET EVERY 12 HOURS  . timolol (TIMOPTIC) 0.25 % ophthalmic solution   . [DISCONTINUED] aspirin EC 81 MG tablet Take 81 mg by mouth in the morning and at bedtime.  (Patient not taking: Reported on 08/04/2020)  . [DISCONTINUED] ciclopirox (LOPROX) 0.77 % cream Apply topically 2 (two) times daily.  (Patient not taking: No sig reported)   No facility-administered encounter medications on file as of 08/04/2020.    Allergies as of 08/04/2020  . (No Known Allergies)    Past Medical History:  Diagnosis Date  . Arthritis   . COPD (chronic obstructive pulmonary disease) (HCC)    chronic cough  . Glaucoma   . Hyperlipidemia   . Hypertension   . Irregular heartbeat   . Osteoporosis   . Thyroid disease    enlarged thyroid    Past Surgical History:  Procedure Laterality Date  . APPENDECTOMY    . cateract surgery    . COLONOSCOPY     Done in [place]  yrs ago  . UPPER GASTROINTESTINAL ENDOSCOPY  2019  . WRIST SURGERY      Family History  Problem Relation Age of Onset  . Heart disease Mother   . Heart disease Father   . Heart disease Brother   . Heart disease Brother   . Colon cancer Neg Hx   . Colon polyps Neg Hx   . Esophageal cancer Neg  Hx     Social History   Socioeconomic History  . Marital status: Widowed    Spouse name: Not on file  . Number of children: Not on file  . Years of education: Not on file  . Highest education level: Not on file  Occupational History  . Occupation: Retired  Tobacco Use  . Smoking status: Former Smoker    Packs/day: 0.25    Years: 2.00    Pack years: 0.50    Types: Cigarettes    Start date: 48    Quit date: 1952    Years since quitting: 70.0  . Smokeless tobacco: Never Used  Vaping Use  . Vaping Use: Never used  Substance and Sexual Activity  . Alcohol use: Not  Currently  . Drug use: Never  . Sexual activity: Not on file  Other Topics Concern  . Not on file  Social History Narrative   Resident at [employer]    Enjoys playing cards    Social Determinants of Health   Financial Resource Strain: Not on file  Food Insecurity: Not on file  Transportation Needs: Not on file  Physical Activity: Not on file  Stress: Not on file  Social Connections: Not on file  Intimate Partner Violence: Not on file      Review of systems: All other review of systems negative except as mentioned in the HPI.   Physical Exam: Vitals:   08/04/20 1033  BP: (!) 172/72  Pulse: 62   Body mass index is 20.14 kg/m. Gen:      No acute distress HEENT:  sclera anicteric Abd:      soft, non-tender; no palpable masses, no distension Ext:    No edema Neuro: alert and oriented x 3 Psych: normal mood and affect  Data Reviewed:  Reviewed labs, radiology imaging, old records and pertinent past GI work up   Assessment and Plan/Recommendations:  86 year old very pleasant female here for follow-up visit with complaints of lower abdominal discomfort, change in bowel habits and excessive flatulence  Recent CT abdomen pelvis was negative for any acute intra-abdominal pathology other than gastric wall thickening along the lesser curvature.  Follow-up EGD was negative for any mucosal lesions or gastric adenocarcinoma.  Discussed dietary changes with increasing water intake and dietary fiber Add Benefiber 1 teaspoon 2-3 times daily with meals  Use IBgard 1 capsule up to 3 times daily as needed for abdominal discomfort and bloating  Mild gastritis: Use famotidine 20 mg twice daily as needed  Advised patient to call back if she continues to have persistent symptoms or worsening symptoms   This visit required >40 minutes of patient care (this includes precharting, chart review, review of results, face-to-face time used for counseling as well as treatment plan  and follow-up. The patient was provided an opportunity to ask questions and all were answered. The patient agreed with the plan and demonstrated an understanding of the instructions.  Iona Beard , MD    CC: Orland Mustard, MD

## 2020-08-04 NOTE — Patient Instructions (Signed)
Benefiber - 1 teaspoon 2-3 times daily.   Samples of IBgard- Take 1 capsule by mouth three times daily. IBagrd is purchased over the counter.   If you are age 84 or older, your body mass index should be between 23-30. Your Body mass index is 20.14 kg/m. If this is out of the aforementioned range listed, please consider follow up with your Primary Care Provider.  Follow-up as needed.   Thank you for choosing me and [facility].  Dr.Nandigam

## 2020-08-09 ENCOUNTER — Encounter: Payer: Self-pay | Admitting: Family Medicine

## 2020-08-09 NOTE — Telephone Encounter (Signed)
FYI

## 2020-09-02 ENCOUNTER — Encounter: Payer: Self-pay | Admitting: Family Medicine

## 2020-09-02 ENCOUNTER — Telehealth: Payer: Self-pay

## 2020-09-02 NOTE — Telephone Encounter (Signed)
Let her know I dont' call in antibiotics without seeing patient. Can cause more problems than good and increase risk of antibiotic resistance or c.diff from antibiotics.  Let me know if she can come in tomorrow,  dr. Artis Flock

## 2020-09-02 NOTE — Telephone Encounter (Signed)
We can see her tomorrow..  Dr. Artis Flock

## 2020-09-02 NOTE — Telephone Encounter (Signed)
Pt states she has a very bad cold that is moving to her chest. She wanted to be seen today. I told her we had no openings. Pt asked since her cold was so bad if Dr. Artis Flock would send her in a prescription. Told pt I would send Dr. Blair Heys team a message and someone would get back with her.

## 2020-09-02 NOTE — Telephone Encounter (Signed)
See below

## 2020-09-02 NOTE — Telephone Encounter (Signed)
Told pt we could get her scheduled tomorrow. She said " I don't think I can make it through the night. Can she not just call me a prescription in for today?". Pt states she has to rely on her daughter from [place] to bring her, and she does not know if she can bring her

## 2020-09-03 ENCOUNTER — Telehealth (INDEPENDENT_AMBULATORY_CARE_PROVIDER_SITE_OTHER): Payer: Medicare Other | Admitting: Family Medicine

## 2020-09-03 ENCOUNTER — Encounter: Payer: Self-pay | Admitting: Family Medicine

## 2020-09-03 DIAGNOSIS — R059 Cough, unspecified: Secondary | ICD-10-CM | POA: Diagnosis not present

## 2020-09-03 MED ORDER — BENZONATATE 100 MG PO CAPS: 100.0000 mg | ORAL_CAPSULE | Freq: Two times a day (BID) | ORAL | 0 refills | Status: DC | PRN

## 2020-09-03 MED ORDER — AMOXICILLIN 500 MG PO CAPS: 500.0000 mg | ORAL_CAPSULE | Freq: Two times a day (BID) | ORAL | 0 refills | Status: DC

## 2020-09-03 NOTE — Progress Notes (Signed)
Patient: Linda [name] MRN: [id number] DOB: 02/26/1924 PCP: Orland Mustard, MD     I connected with Linda [name] on 09/03/20 at 11:45am by a video enabled telemedicine application and verified that I am speaking with the correct person using two identifiers.  Location patient: Home Location provider: [facility], Office Persons participating in this virtual visit: Linda [name], daughter cheryl Seppala and Dr. Artis Flock   I discussed the limitations of evaluation and management by telemedicine and the availability of in person appointments. The patient expressed understanding and agreed to proceed.   Interactive audio and video telecommunications were attempted between this provider and patient, however failed, due to patient having technical difficulties OR patient did not have access to video capability.  We continued and completed visit with audio only.     Subjective:  Chief Complaint  Patient presents with   Cough   Nasal Congestion    Cough for 3 days. Brownish phlegm.  No fever. 96.7.  No sick contact.      HPI: The patient is a 87 y.o. female who presents today for cough and nasal congestion x 3 days.she started off with sore throat, runny nose/congestion about a week ago). She continued to have cough and congestion.  She wanted an antibiotic to just be sent in, but I asked that she be seen. She states she doesn't feel bad, she is just coughing up thick brown mucous. She has been taking mucinex DM x 6 days. She was tested for covid and it was negative, but she was tested on Sunday. She has no shortness of breath, wheezing or chest tightness. She doesn't feel like the mucinex is helping her much at all. She is in an independent living and she doesn't recall any sick contacts. No covid outbreaks. She does not smoke. Does have chronic rhinitis and a chronic cough, but she states this is different with the mucous production.   She is vaccinated and boosted.   Review of Systems   Constitutional: Negative for chills, fatigue and fever.  HENT: Positive for congestion. Negative for sinus pressure, sinus pain and sore throat.   Respiratory: Negative for cough, shortness of breath and wheezing.   Gastrointestinal: Negative for abdominal pain.  Neurological: Negative for dizziness and headaches.    Allergies Patient has No Known Allergies.  Past Medical History Patient  has a past medical history of Arthritis, COPD (chronic obstructive pulmonary disease) (HCC), Glaucoma, Hyperlipidemia, Hypertension, Irregular heartbeat, Osteoporosis, and Thyroid disease.  Surgical History Patient  has a past surgical history that includes Appendectomy; Colonoscopy; cateract surgery; Wrist surgery; and Upper gastrointestinal endoscopy (2019).  Family History Pateint's family history includes Heart disease in her brother, brother, father, and mother.  Social History Patient  reports that she quit smoking about 70 years ago. Her smoking use included cigarettes. She started smoking about 72 years ago. She has a 0.50 pack-year smoking history. She has never used smokeless tobacco. She reports previous alcohol use. She reports that she does not use drugs.    Objective: There were no vitals filed for this visit.  There is no height or weight on file to calculate BMI.  Physical Exam     Assessment/plan: 1. Cough -viral bronchitis vs. Chronic cough. Discussed no signs or symptoms that suggest bacterial origin. Discussed conservative therapy. Conservative therapy with cool mist humidifier at night, rest/fluids, and honey daily. Recommend 1 tablespoon/day. Also recommended over the counter anti tussive medication: robitussin DM during the day or continue her mucinex dm. Sending in  tessalon pearls prn for cough. Discussed viral in nature and no indication for antibiotics at this point. Precautions given for worsening symptoms, fever, shortness of breath to let us know immediatly.   Since  it's the weekend will send in pocket px for amoxicillin, but discussed I would not start this unless change in sputum, worsening symptoms and to let me know.      Audio for entire duration of appointment. 15 minutes was spent talking with patient, getting history, discussing treatment.   Return if symptoms worsen or fail to improve.    Orland Mustard, MD [facility]  09/03/2020

## 2020-09-29 ENCOUNTER — Other Ambulatory Visit: Payer: Self-pay

## 2020-09-29 ENCOUNTER — Ambulatory Visit (INDEPENDENT_AMBULATORY_CARE_PROVIDER_SITE_OTHER): Payer: Medicare Other | Admitting: Family Medicine

## 2020-09-29 ENCOUNTER — Encounter: Payer: Self-pay | Admitting: Family Medicine

## 2020-09-29 VITALS — BP 144/60 | HR 65 | Temp 96.2°F | Ht 65.0 in | Wt 123.0 lb

## 2020-09-29 DIAGNOSIS — R3915 Urgency of urination: Secondary | ICD-10-CM

## 2020-09-29 DIAGNOSIS — L659 Nonscarring hair loss, unspecified: Secondary | ICD-10-CM

## 2020-09-29 DIAGNOSIS — I10 Essential (primary) hypertension: Secondary | ICD-10-CM | POA: Diagnosis not present

## 2020-09-29 DIAGNOSIS — I6529 Occlusion and stenosis of unspecified carotid artery: Secondary | ICD-10-CM

## 2020-09-29 DIAGNOSIS — R053 Chronic cough: Secondary | ICD-10-CM | POA: Diagnosis not present

## 2020-09-29 DIAGNOSIS — E559 Vitamin D deficiency, unspecified: Secondary | ICD-10-CM

## 2020-09-29 DIAGNOSIS — L219 Seborrheic dermatitis, unspecified: Secondary | ICD-10-CM

## 2020-09-29 LAB — CBC WITH DIFFERENTIAL/PLATELET
Basophils Absolute: 0 10*3/uL (ref 0.0–0.1)
Basophils Relative: 0.6 % (ref 0.0–3.0)
Eosinophils Absolute: 0.2 10*3/uL (ref 0.0–0.7)
Eosinophils Relative: 3 % (ref 0.0–5.0)
HCT: 38.9 % (ref 36.0–46.0)
Hemoglobin: 13 g/dL (ref 12.0–15.0)
Lymphocytes Relative: 20.3 % (ref 12.0–46.0)
Lymphs Abs: 1.6 10*3/uL (ref 0.7–4.0)
MCHC: 33.5 g/dL (ref 30.0–36.0)
MCV: 95 fl (ref 78.0–100.0)
Monocytes Absolute: 0.7 10*3/uL (ref 0.1–1.0)
Monocytes Relative: 8.7 % (ref 3.0–12.0)
Neutro Abs: 5.3 10*3/uL (ref 1.4–7.7)
Neutrophils Relative %: 67.4 % (ref 43.0–77.0)
Platelets: 281 10*3/uL (ref 150.0–400.0)
RBC: 4.09 Mil/uL (ref 3.87–5.11)
RDW: 13.4 % (ref 11.5–15.5)
WBC: 7.8 10*3/uL (ref 4.0–10.5)

## 2020-09-29 LAB — COMPREHENSIVE METABOLIC PANEL
ALT: 19 U/L (ref 0–35)
AST: 25 U/L (ref 0–37)
Albumin: 4.2 g/dL (ref 3.5–5.2)
Alkaline Phosphatase: 102 U/L (ref 39–117)
BUN: 11 mg/dL (ref 6–23)
CO2: 30 mEq/L (ref 19–32)
Calcium: 11.1 mg/dL — ABNORMAL HIGH (ref 8.4–10.5)
Chloride: 99 mEq/L (ref 96–112)
Creatinine, Ser: 0.65 mg/dL (ref 0.40–1.20)
GFR: 75.3 mL/min (ref >60.00)
Glucose, Bld: 91 mg/dL (ref 70–99)
Potassium: 4.5 mEq/L (ref 3.5–5.1)
Sodium: 136 mEq/L (ref 135–145)
Total Bilirubin: 0.4 mg/dL (ref 0.2–1.2)
Total Protein: 7.9 g/dL (ref 6.0–8.3)

## 2020-09-29 LAB — URINALYSIS, ROUTINE W REFLEX MICROSCOPIC
Bilirubin Urine: NEGATIVE
Ketones, ur: NEGATIVE
Leukocytes,Ua: NEGATIVE
Nitrite: NEGATIVE
Specific Gravity, Urine: 1.01 (ref 1.000–1.030)
Total Protein, Urine: NEGATIVE
Urine Glucose: NEGATIVE
Urobilinogen, UA: 0.2 (ref 0.0–1.0)
pH: 6 (ref 5.0–8.0)

## 2020-09-29 LAB — TSH: TSH: 0.39 u[IU]/mL (ref 0.35–4.50)

## 2020-09-29 MED ORDER — AZITHROMYCIN 250 MG PO TABS: ORAL_TABLET | ORAL | 0 refills | Status: DC

## 2020-09-29 MED ORDER — KETOCONAZOLE 2 % EX SHAM: 1.0000 "application " | MEDICATED_SHAMPOO | CUTANEOUS | 0 refills | Status: DC

## 2020-09-29 NOTE — Progress Notes (Signed)
Patient: Linda [name] MRN: [id number] DOB: 1923-09-16 PCP: Orland Mustard, MD     Subjective:  Chief Complaint  Patient presents with  . Hypertension  . Nasal Congestion  . Cough    Pt says that she still has a cough. She has taken Mucinex, but has not subsided. She also has not taken [employer] that were prescribed.     HPI: The patient is a 87 y.o. female who presents today for HTN. Pt also complains of cough, and nasal congestion since having a bad cold.  Hypertension:  Here for follow up of hypertension.  Currently on no medication. When I saw her last time it was quite elevated and I had them keep a log. Her home log shows her readings to be to gaol form 124-138/60-72. Exercise includes none. Weight has been stable. Denies any chest pain, headaches, shortness of breath, vision changes, swelling in lower extremities. She states they do not check her BP at her assisted living.   Cough She is still having a cough. I saw her on a virtual visit. She has a chronic cough in her problem list and has seen pulmonology. She states it different from this cough. Her mucous production is better. She has no shortness of breath, states she is using her flonase, but only in the AM. She also has bronchiectasis and likely underlying issue.   Urinary issues She states about 4 or 5am she has the urge to urinate. She has to go 4-5x and has to really strain to urinate, but after this she is fine all day. NO dysuria, odor, foul smell. She is drinking more water and more so at night. No fever/chills. No new back pain.   Hair loss She noticed this about 10 years ago. Mainly just getting thinner and falling out faster. TSH checked less than 6 months ago and was normal. Her scalp also itches and she has red spots on her head.   Review of Systems  Constitutional: Negative for chills and fever.  HENT: Positive for congestion. Negative for sore throat.   Respiratory: Positive for cough. Negative for  shortness of breath and wheezing.   Genitourinary: Positive for difficulty urinating. Negative for dysuria, flank pain, frequency, hematuria, pelvic pain and urgency.    Allergies Patient has No Known Allergies.  Past Medical History Patient  has a past medical history of Arthritis, COPD (chronic obstructive pulmonary disease) (HCC), Glaucoma, Hyperlipidemia, Hypertension, Irregular heartbeat, Osteoporosis, and Thyroid disease.  Surgical History Patient  has a past surgical history that includes Appendectomy; Colonoscopy; cateract surgery; Wrist surgery; and Upper gastrointestinal endoscopy (2019).  Family History Pateint's family history includes Heart disease in her brother, brother, father, and mother.  Social History Patient  reports that she quit smoking about 70 years ago. Her smoking use included cigarettes. She started smoking about 72 years ago. She has a 0.50 pack-year smoking history. She has never used smokeless tobacco. She reports previous alcohol use. She reports that she does not use drugs.    Objective: Vitals:   09/29/20 1100  BP: (!) 144/60  Pulse: 65  Temp: (!) 96.2 F (35.7 C)  TempSrc: Temporal  SpO2: 96%  Weight: 123 lb (55.8 kg)  Height: 5\' 5"  (1.651 m)    Body mass index is 20.47 kg/m.  Physical Exam Vitals reviewed.  Constitutional:      Appearance: Normal appearance. She is normal weight.  HENT:     Head: Normocephalic and atraumatic.     Comments: Hard of hearing  Cardiovascular:     Rate and Rhythm: Normal rate and regular rhythm.  Pulmonary:     Effort: Pulmonary effort is normal.     Breath sounds: Normal breath sounds.  Abdominal:     General: Abdomen is flat. Bowel sounds are normal.     Palpations: Abdomen is soft.  Musculoskeletal:     Cervical back: Normal range of motion and neck supple.  Skin:    General: Skin is warm.     Comments: Thinning hair. Negative hair pull test. White, flaky spots on scalp.   Neurological:      General: No focal deficit present.     Mental Status: She is alert and oriented to person, place, and time.  Psychiatric:        Mood and Affect: Mood normal.        Behavior: Behavior normal.        Assessment/plan: 1. Essential hypertension Home readings to goal which have been manual by staff. Better today in office. Will continue to monitor. F/u in 6 months.   2. Vitamin D deficiency Check today since getting labs.   3. Chronic cough Discussed likely secondary to her bronchiectasis. Will have her take the zpack, but discussed not to take at same time as sotalol. Needs to f/u with pulm and they will call and make appointment. Ms. Lapaglia has not really wanted to see them again, but encouraged them since her cough has been persistent.   4. Hair loss Appears to be natural aging, but will check thyroid and basic labs again.  - PTH, Intact and Calcium - CBC with Differential/Platelet - Comprehensive metabolic panel - TSH  5. Seborrheic dermatitis of scalp Ketoconazole BID shampoo. Discussed this with her and daughter.   6. Urinary urgency Only has this feeling in the AM. Check ua and culture. No red flags in hx or on exam.  - Urinalysis, Routine w reflex microscopic - Urine Culture  7. Serum calcium elevated Has fluctuated up and down. Checking today with PTH. If elevated again will refer.  - PTH, Intact and Calcium  8. Carotid stenosis - could not find any imaging in chart. Will order ultrasound.   This visit occurred during the SARS-CoV-2 public health emergency.  Safety protocols were in place, including screening questions prior to the visit, additional usage of staff PPE, and extensive cleaning of exam room while observing appropriate contact time as indicated for disinfecting solutions.     No follow-ups on file.    Orland Mustard, MD [facility]   09/29/2020

## 2020-09-29 NOTE — Patient Instructions (Signed)
-  checking urine -shampoo sent in for your hair.   -I would go to pulmonology for your lungs. You have bronchiectasis and I think you should see them at least yearly.   -have zpack, just do not take at same time as your sotalol.   -change flonase to nightly.    -blood pressure high here, but home readings are to goal.   See you for regular f/u or as needed. Have fun today! Dr. Artis Flock

## 2020-09-30 ENCOUNTER — Other Ambulatory Visit: Payer: Self-pay | Admitting: Family Medicine

## 2020-10-01 LAB — PTH, INTACT AND CALCIUM
Calcium: 11.1 mg/dL — ABNORMAL HIGH (ref 8.6–10.4)
PTH: 29 pg/mL (ref 14–64)

## 2020-10-01 LAB — URINE CULTURE
MICRO NUMBER:: 11541999
Result:: NO GROWTH
SPECIMEN QUALITY:: ADEQUATE

## 2020-10-04 ENCOUNTER — Ambulatory Visit (HOSPITAL_COMMUNITY)
Admission: RE | Admit: 2020-10-04 | Discharge: 2020-10-04 | Disposition: A | Discharge status: Home / Self Care | Payer: Medicare Other | Source: Ambulatory Visit | Attending: Family Medicine | Admitting: Family Medicine

## 2020-10-04 ENCOUNTER — Other Ambulatory Visit: Payer: Self-pay

## 2020-10-04 DIAGNOSIS — I6529 Occlusion and stenosis of unspecified carotid artery: Secondary | ICD-10-CM

## 2020-10-05 ENCOUNTER — Encounter: Payer: Self-pay | Admitting: Family Medicine

## 2020-10-06 ENCOUNTER — Other Ambulatory Visit: Payer: Self-pay

## 2020-10-12 ENCOUNTER — Telehealth: Payer: Self-pay | Admitting: Emergency Medicine

## 2020-10-12 NOTE — Telephone Encounter (Signed)
Called and spoke to [place], daughter (on [place]). She states the pt has requested to change to a female provider. Elnita Maxwell is aware that we must clear the change with both providers. She verbalized understanding.   Dr. Delton Coombes, please advise if you are ok with the switch.   Dr. Desai/Dr. Everardo All, please advise if either of you would take pt on.   Thanks!!

## 2020-10-13 NOTE — Telephone Encounter (Signed)
This is ok with me  

## 2020-10-13 NOTE — Telephone Encounter (Signed)
10/13/2020  Attempted to contact the patient.  Left detailed voicemail that is perfectly okay for the patient to establish with a female provider in our office.  Instructed patient to give our office a call back and schedule a 30-minute office visit with either Dr. Everardo All or Dr. Celine Mans in order for this to happen.  Front Information systems manager,  If the patient or patient's daughter call back please schedule a 30-minute office visit/new patient appointment with Dr. Everardo All or Dr. Celine Mans.  This is a former Dr. Delton Coombes patient.  We will leave in triage for 1 more attempt on 10/14/2020 per protocol.  Elisha Headland, FNP

## 2020-10-15 NOTE — Telephone Encounter (Signed)
Pt has already been scheduled with [facility]  Nothing further needed

## 2020-10-20 ENCOUNTER — Encounter: Payer: Self-pay | Admitting: Family Medicine

## 2020-11-08 ENCOUNTER — Other Ambulatory Visit: Payer: Self-pay

## 2020-11-08 ENCOUNTER — Ambulatory Visit (INDEPENDENT_AMBULATORY_CARE_PROVIDER_SITE_OTHER): Payer: Medicare Other | Admitting: Family Medicine

## 2020-11-08 ENCOUNTER — Encounter: Payer: Self-pay | Admitting: Family Medicine

## 2020-11-08 ENCOUNTER — Telehealth: Payer: Self-pay | Admitting: Family Medicine

## 2020-11-08 DIAGNOSIS — H6123 Impacted cerumen, bilateral: Secondary | ICD-10-CM | POA: Insufficient documentation

## 2020-11-08 DIAGNOSIS — M1712 Unilateral primary osteoarthritis, left knee: Secondary | ICD-10-CM

## 2020-11-08 DIAGNOSIS — M1711 Unilateral primary osteoarthritis, right knee: Secondary | ICD-10-CM

## 2020-11-08 NOTE — Telephone Encounter (Signed)
Requesting approval for gel injections for bilateral knee OA. 

## 2020-11-08 NOTE — Progress Notes (Signed)
Office Visit Note   Patient: Linda [name]           Date of Birth: 1923-09-03           MRN: [id number] Visit Date: 11/08/2020 Requested by: Orland Mustard, MD [address] [place],  [place] [address] PCP: Orland Mustard, MD  Subjective: Chief Complaint  Patient presents with  . Right Knee - Pain    Requests cortisone injections. Last had on 06/25/20. They lasted 2-3 months.  . Left Knee - Pain    HPI: She is here with bilateral knee pain.  History of osteoarthritis.  Cortisone injections are giving about 2 or 3 months of relief.               ROS:   All other systems were reviewed and are negative.  Objective: Vital Signs: There were no vitals taken for this visit.  Physical Exam:  General:  Alert and oriented, in no acute distress. Pulm:  Breathing unlabored. Psy:  Normal mood, congruent affect. Skin: No erythema Knees: Trace effusion bilaterally with no warmth.  Imaging: No results found.  Assessment & Plan: 1.  Bilateral knee osteoarthritis -Discussed with patient and elected to inject with steroid today.  We will request approval for gel injections for future use.     Procedures: Bilateral knee steroid injections: After sterile prep with Betadine, injected 3 cc 0.25% bupivacaine and 6 mg betamethasone from lateral midpatellar approach on the right and medial midpatellar approach on the left.       PMFS History: Patient Active Problem List   Diagnosis Date Noted  . Bronchiectasis without acute exacerbation (HCC) 07/14/2019  . Chronic cough 02/03/2019  . Thoracic back pain 10/13/2018  . Hyperlipidemia 10/13/2018  . Vitamin D deficiency 10/13/2018  . Gastroesophageal reflux disease without esophagitis 10/13/2018  . Chronic midline thoracic back pain 10/13/2018  . Osteoporosis without current pathological fracture 10/13/2018  . Essential hypertension 10/13/2018  . Stenosis of carotid artery 10/13/2018  . History of thyroid nodule, s/p bx, benign  10/13/2018  . Aortic cusp regurgitation 10/13/2018  . Nonrheumatic tricuspid valve regurgitation 10/13/2018  . Coccyalgia 10/13/2018  . Irritable bowel syndrome 10/13/2018  . Bilateral hearing loss 10/13/2018  . Chronic rhinitis 10/13/2018  . Paroxysmal atrial tachycardia (HCC) 10/13/2018   Past Medical History:  Diagnosis Date  . Arthritis   . COPD (chronic obstructive pulmonary disease) (HCC)    chronic cough  . Glaucoma   . Hyperlipidemia   . Hypertension   . Irregular heartbeat   . Osteoporosis   . Thyroid disease    enlarged thyroid    Family History  Problem Relation Age of Onset  . Heart disease Mother   . Heart disease Father   . Heart disease Brother   . Heart disease Brother   . Colon cancer Neg Hx   . Colon polyps Neg Hx   . Esophageal cancer Neg Hx     Past Surgical History:  Procedure Laterality Date  . APPENDECTOMY    . cateract surgery    . COLONOSCOPY     Done in [place]  yrs ago  . UPPER GASTROINTESTINAL ENDOSCOPY  2019  . WRIST SURGERY     Social History   Occupational History  . Occupation: Retired  Tobacco Use  . Smoking status: Former Smoker    Packs/day: 0.25    Years: 2.00    Pack years: 0.50    Types: Cigarettes    Start date: 1950  Quit date: 70    Years since quitting: 70.2  . Smokeless tobacco: Never Used  Vaping Use  . Vaping Use: Never used  Substance and Sexual Activity  . Alcohol use: Not Currently  . Drug use: Never  . Sexual activity: Not on file

## 2020-11-10 ENCOUNTER — Encounter: Payer: Self-pay | Admitting: Internal Medicine

## 2020-11-10 ENCOUNTER — Other Ambulatory Visit: Payer: Self-pay

## 2020-11-10 ENCOUNTER — Ambulatory Visit (INDEPENDENT_AMBULATORY_CARE_PROVIDER_SITE_OTHER): Payer: Medicare Other | Admitting: Internal Medicine

## 2020-11-10 VITALS — BP 128/78 | HR 70 | Temp 97.3°F | Ht 65.0 in | Wt 124.0 lb

## 2020-11-10 DIAGNOSIS — R053 Chronic cough: Secondary | ICD-10-CM

## 2020-11-10 DIAGNOSIS — J479 Bronchiectasis, uncomplicated: Secondary | ICD-10-CM | POA: Diagnosis not present

## 2020-11-10 DIAGNOSIS — A319 Mycobacterial infection, unspecified: Secondary | ICD-10-CM

## 2020-11-10 NOTE — Patient Instructions (Signed)
The patient should have follow up scheduled with myself in 6 months.   Please let us know if your coughing gets worse before your next appointment. If you are able to bring up sputum, let us know and we can send it to the lab for culture.

## 2020-11-10 NOTE — Progress Notes (Signed)
Linda [name]    [id number]    26-Dec-1923  Primary Care Physician:[name], Linda Hail, MD Date of Appointment: 11/10/2020 Established Patient Visit  Chief complaint:   Chief Complaint  Patient presents with  . Cough    Reports feels at baseline today and would like to discuss last CT results. Reports cough improved since completing z-pack     HPI: Linda [name] is a 87 y.o. woman with history of bronchiectasis and MAI. Former patient of Dr. Lamonte [name], here to establish care with me today.   Interval Updates: Recently completed Z-pack for cough in January. Cough is feeling much better.   She still has cough but it's dry. She occasionally brings up some brownish mucus - no blood comes up.  She denies dyspnea chest tightness, hemoptysis. She does not think she could bring up a spoonful.  She seems to have some response to azithromycin which she gets about once a year for flares.  She is on any kind of airway clearance therapy.  She is not on any inhalers.  She does have nasal congestion and takes Flonase.  I have reviewed the patient's family social and past medical history and updated as appropriate.   Past Medical History:  Diagnosis Date  . Arthritis   . COPD (chronic obstructive pulmonary disease) (HCC)    chronic cough  . Glaucoma   . Hyperlipidemia   . Hypertension   . Irregular heartbeat   . Osteoporosis   . Thyroid disease    enlarged thyroid    Past Surgical History:  Procedure Laterality Date  . APPENDECTOMY    . cateract surgery    . COLONOSCOPY     Done in [place]  yrs ago  . UPPER GASTROINTESTINAL ENDOSCOPY  2019  . WRIST SURGERY      Family History  Problem Relation Age of Onset  . Heart disease Mother   . Heart disease Father   . Heart disease Brother   . Heart disease Brother   . Colon cancer Neg Hx   . Colon polyps Neg Hx   . Esophageal cancer Neg Hx     Social History   Occupational History  . Occupation: Retired  Tobacco Use  .  Smoking status: Former Smoker    Packs/day: 0.25    Years: 2.00    Pack years: 0.50    Types: Cigarettes    Start date: 47    Quit date: 1952    Years since quitting: 70.2  . Smokeless tobacco: Never Used  Vaping Use  . Vaping Use: Never used  Substance and Sexual Activity  . Alcohol use: Not Currently  . Drug use: Never  . Sexual activity: Not on file     Physical Exam: Blood pressure 128/78, pulse 70, temperature (!) 97.3 F (36.3 C), height _0  (1.651 m), weight 124 lb (56.2 kg), SpO2 94 %.  Gen:      No acute distress Lungs:    Kyphosis, No increased respiratory effort, symmetric chest wall excursion, clear to auscultation bilaterally, no wheezes or crackles CV:         Regular rate and rhythm; no murmurs, rubs, or gallops. Thin,  No pedal edema   Data Reviewed: Imaging: I have personally reviewed the CT Chest October 2021 which shows bronchiectasis with nodule densities consistent with MAI infection.  PFTs:  PFT Results Latest Ref Rng & Units 07/14/2019  FVC-Pre L 1.83  FVC-Predicted Pre % 91  FVC-Post L  1.75  FVC-Predicted Post % 87  Pre FEV1/FVC % % 70  Post FEV1/FCV % % 78  FEV1-Pre L 1.29  FEV1-Predicted Pre % 88  FEV1-Post L 1.36  DLCO uncorrected ml/min/mmHg 12.92  TLC L 4.36  TLC % Predicted % 86  RV % Predicted % 99   I have personally reviewed the patient's PFTs and there is mild airflow limitation without BD response. Lung volumes normal, diffusion capacity is moderately reduced.   Labs:  Immunization status: Immunization History  Administered Date(s) Administered  . Influenza, High Dose Seasonal PF 05/15/2019  . Influenza-Unspecified 06/04/2018, 05/07/2020  . PFIZER(Purple Top)SARS-COV-2 Vaccination 08/28/2019, 09/17/2019, 06/02/2020  . 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  At this point she prefers to monitor her bronchiectasis expectantly.  She is not having daily symptoms of cough congestion shortness of breath.  We discussed obtaining sputum samples which she is not bringing up enough sputum.  We also discussed bronchoscopy but she feels this might be too aggressive and I agree.  We discussed how if she develops a flare again of her symptoms and is able to bring up mucus we can send that to the lab for culture.  Ultimately decision to treat MAI if this is what is in her lungs after her.  We discussed that it would be 3 antibiotics for at least 9 months which can be arduous for many patients especially at advanced age.   I spent 30 minutes on 11/10/2020 in care of this patient including face to face time and non-face to face time spent charting, review of outside records, and coordination of care.   Return to Care: Return in about 6 months (around 05/13/2021).   Lenice Llamas, MD Pulmonary and [place]

## 2020-11-11 NOTE — Telephone Encounter (Signed)
Noted  

## 2020-11-18 ENCOUNTER — Encounter: Payer: Self-pay | Admitting: Family Medicine

## 2020-11-18 ENCOUNTER — Other Ambulatory Visit: Payer: Self-pay

## 2020-11-18 ENCOUNTER — Ambulatory Visit (INDEPENDENT_AMBULATORY_CARE_PROVIDER_SITE_OTHER): Payer: Medicare Other | Admitting: Family Medicine

## 2020-11-18 VITALS — BP 190/80 | HR 69 | Temp 97.6°F | Ht 65.0 in | Wt 122.6 lb

## 2020-11-18 DIAGNOSIS — I10 Essential (primary) hypertension: Secondary | ICD-10-CM | POA: Diagnosis not present

## 2020-11-18 DIAGNOSIS — R5381 Other malaise: Secondary | ICD-10-CM | POA: Diagnosis not present

## 2020-11-18 DIAGNOSIS — R350 Frequency of micturition: Secondary | ICD-10-CM

## 2020-11-18 LAB — CBC WITH DIFFERENTIAL/PLATELET
Basophils Absolute: 0 10*3/uL (ref 0.0–0.1)
Basophils Relative: 0.3 % (ref 0.0–3.0)
Eosinophils Absolute: 0.1 10*3/uL (ref 0.0–0.7)
Eosinophils Relative: 0.9 % (ref 0.0–5.0)
HCT: 40.9 % (ref 36.0–46.0)
Hemoglobin: 13.6 g/dL (ref 12.0–15.0)
Lymphocytes Relative: 18.3 % (ref 12.0–46.0)
Lymphs Abs: 1.5 10*3/uL (ref 0.7–4.0)
MCHC: 33.2 g/dL (ref 30.0–36.0)
MCV: 95.6 fl (ref 78.0–100.0)
Monocytes Absolute: 0.5 10*3/uL (ref 0.1–1.0)
Monocytes Relative: 6.1 % (ref 3.0–12.0)
Neutro Abs: 6.3 10*3/uL (ref 1.4–7.7)
Neutrophils Relative %: 74.4 % (ref 43.0–77.0)
Platelets: 267 10*3/uL (ref 150.0–400.0)
RBC: 4.28 Mil/uL (ref 3.87–5.11)
RDW: 12.6 % (ref 11.5–15.5)
WBC: 8.4 10*3/uL (ref 4.0–10.5)

## 2020-11-18 LAB — COMPREHENSIVE METABOLIC PANEL
ALT: 25 U/L (ref 0–35)
AST: 29 U/L (ref 0–37)
Albumin: 4.3 g/dL (ref 3.5–5.2)
Alkaline Phosphatase: 87 U/L (ref 39–117)
BUN: 10 mg/dL (ref 6–23)
CO2: 31 mEq/L (ref 19–32)
Calcium: 11.1 mg/dL — ABNORMAL HIGH (ref 8.4–10.5)
Chloride: 100 mEq/L (ref 96–112)
Creatinine, Ser: 0.67 mg/dL (ref 0.40–1.20)
GFR: 74.68 mL/min (ref >60.00)
Glucose, Bld: 101 mg/dL — ABNORMAL HIGH (ref 70–99)
Potassium: 4.8 mEq/L (ref 3.5–5.1)
Sodium: 136 mEq/L (ref 135–145)
Total Bilirubin: 0.5 mg/dL (ref 0.2–1.2)
Total Protein: 7.5 g/dL (ref 6.0–8.3)

## 2020-11-18 LAB — URINALYSIS, ROUTINE W REFLEX MICROSCOPIC
Bilirubin Urine: NEGATIVE
Hgb urine dipstick: NEGATIVE
Ketones, ur: NEGATIVE
Leukocytes,Ua: NEGATIVE
Nitrite: NEGATIVE
RBC / HPF: NONE SEEN (ref 0–?)
Specific Gravity, Urine: <=1.005 — AB (ref 1.000–1.030)
Total Protein, Urine: NEGATIVE
Urine Glucose: NEGATIVE
Urobilinogen, UA: 0.2 (ref 0.0–1.0)
WBC, UA: NONE SEEN (ref 0–?)
pH: 7 (ref 5.0–8.0)

## 2020-11-18 LAB — TSH: TSH: 0.38 u[IU]/mL (ref 0.35–4.50)

## 2020-11-18 NOTE — Progress Notes (Signed)
Patient: Linda [name] MRN: [id number] DOB: [birth year] PCP: Orland Mustard, MD     Subjective:  Chief Complaint  Patient presents with  . not feeling well    HPI: The patient is a 87 y.o. female who presents today for a week of "just not feeling well." She states she just doesn't feel good. She denies any pain, no fever/chills, no worsening cough, no dysuria. She states her stomach doesn't feel good, but can't really tell me more than this. Normal bowel movements. No nausea, vomiting, diarrhea. No blood in her stool. She is eating and drinking normally. She has no new weakness or fatigue out of the ordinary. She states she feels worse in the AM and then is better throughout the day. She eats dinner at 5:30pm and then she doesn't eat again until the AM.   -she was seen at [facility] this past month and her BP 199/80 and then recent visit to pulmonologist was 128/78 on 11/10/20.   -she has no symptoms. Blood pressure readings at home: 118-136/62-70  -has had elevated calcium with normal PTH. Seeing endocrinology tomorrow.   Review of Systems  Constitutional: Negative for chills, diaphoresis, fatigue and fever.  Eyes: Negative for visual disturbance.  Respiratory: Positive for cough. Negative for shortness of breath and wheezing.   Cardiovascular: Negative for chest pain, palpitations and leg swelling.  Gastrointestinal: Positive for abdominal pain. Negative for abdominal distention, blood in stool, diarrhea, nausea and vomiting.  Genitourinary: Negative for dysuria.  Musculoskeletal: Negative for arthralgias and neck pain.  Neurological: Negative for dizziness, facial asymmetry, weakness, light-headedness, numbness and headaches.    Allergies Patient has No Known Allergies.  Past Medical History Patient  has a past medical history of Arthritis, COPD (chronic obstructive pulmonary disease) (HCC), Glaucoma, Hyperlipidemia, Hypertension, Irregular heartbeat, Osteoporosis, and Thyroid  disease.  Surgical History Patient  has a past surgical history that includes Appendectomy; Colonoscopy; cateract surgery; Wrist surgery; and Upper gastrointestinal endoscopy (2019).  Family History Pateint's family history includes Heart disease in her brother, brother, father, and mother.  Social History Patient  reports that she quit smoking about 70 years ago. Her smoking use included cigarettes. She started smoking about 72 years ago. She has a 0.50 pack-year smoking history. She has never used smokeless tobacco. She reports previous alcohol use. She reports that she does not use drugs.    Objective: Vitals:   11/18/20 1135 11/18/20 1212  BP: (!) 213/79 (!) 190/80  Pulse: 69   Temp: 97.6 F (36.4 C)   TempSrc: Temporal   SpO2: 95%   Weight: 122 lb 9.6 oz (55.6 kg)   Height: 5\' 5"  (1.651 m)     Body mass index is 20.4 kg/m.  Physical Exam Vitals reviewed.  Constitutional:      Appearance: Normal appearance. She is normal weight.  HENT:     Head: Normocephalic and atraumatic.     Comments: HOH, bilateral hearing aides Cardiovascular:     Rate and Rhythm: Normal rate and regular rhythm.     Heart sounds: Normal heart sounds.  Pulmonary:     Effort: Pulmonary effort is normal.     Breath sounds: Normal breath sounds.  Abdominal:     General: Bowel sounds are normal.     Palpations: Abdomen is soft.  Musculoskeletal:     Cervical back: Normal range of motion and neck supple.     Right lower leg: No edema.     Left lower leg: No edema.  Skin:  General: Skin is warm.     Capillary Refill: Capillary refill takes less than 2 seconds.     Findings: No rash.  Neurological:     General: No focal deficit present.     Mental Status: She is alert and oriented to person, place, and time.  Psychiatric:        Mood and Affect: Mood normal.        Behavior: Behavior normal.        Assessment/plan: 1. Malaise Obscure history. Discussed with patient and daughter that  history is difficult. Will start with labs and urine, have her see endocrinology tomorrow for her elevated calcium and have close f/u with me for her blood pressure. precautions given.  - CBC with Differential/Platelet - Comprehensive metabolic panel - TSH  2. Urinary frequency Do not think she has a urine infection, but will check since her symptoms are so vague.  - Urinalysis, Routine w reflex microscopic - Urine Culture  3. Essential hypertension Extremely above goal today, but home readings are perfect as was her reading at pulmnology last week. She has no periods of palpitations, sweating, etc. She is asymptomatic. I did discuss if blood pressure is this high can make her not feel good. Her daughter is with her today and is going to get a cuff and try to keep more of a log. Discuss if any symptoms they are to go to ER and discussed importance of this. Close f/u with me in 2 weeks. Asked that they bring cuff as well to make sure calibrated okay.  4. Elevated calcium -seeing endo tomorrow. Checking today, ? If trending upward if contributing to her symptoms.     Return in about 2 weeks (around 12/02/2020) for blood pressure check BRIING CUFF. with [name]. Orland Mustard, MD [facility]   11/18/2020

## 2020-11-18 NOTE — Patient Instructions (Signed)
-  labs/urine today  -blood pressure improved on repeat. Get cuff and come back in 2 weeks for nurse recheck.  If running this high could be cause of your symtpoms.   -any headaches, vision changes, chest pain, leg swelling: go to ER as we call this hypertensive urgency. You have no symptoms right now and your home readings have been so good.   See you in 2 weeks.Linda [name]  Aw

## 2020-11-19 ENCOUNTER — Ambulatory Visit (INDEPENDENT_AMBULATORY_CARE_PROVIDER_SITE_OTHER): Payer: Medicare Other | Admitting: Internal Medicine

## 2020-11-19 ENCOUNTER — Encounter: Payer: Self-pay | Admitting: Internal Medicine

## 2020-11-19 VITALS — BP 126/70 | HR 70 | Ht 65.0 in | Wt 123.2 lb

## 2020-11-19 DIAGNOSIS — E213 Hyperparathyroidism, unspecified: Secondary | ICD-10-CM | POA: Diagnosis not present

## 2020-11-19 DIAGNOSIS — E042 Nontoxic multinodular goiter: Secondary | ICD-10-CM | POA: Diagnosis not present

## 2020-11-19 LAB — BASIC METABOLIC PANEL
BUN: 13 mg/dL (ref 6–23)
CO2: 30 mEq/L (ref 19–32)
Calcium: 10.7 mg/dL — ABNORMAL HIGH (ref 8.4–10.5)
Chloride: 97 mEq/L (ref 96–112)
Creatinine, Ser: 0.79 mg/dL (ref 0.40–1.20)
GFR: 63.91 mL/min (ref >60.00)
Glucose, Bld: 132 mg/dL — ABNORMAL HIGH (ref 70–99)
Potassium: 4.2 mEq/L (ref 3.5–5.1)
Sodium: 133 mEq/L — ABNORMAL LOW (ref 135–145)

## 2020-11-19 LAB — URINE CULTURE
MICRO NUMBER:: 11743138
Result:: NO GROWTH
SPECIMEN QUALITY:: ADEQUATE

## 2020-11-19 LAB — ALBUMIN: Albumin: 3.9 g/dL (ref 3.5–5.2)

## 2020-11-19 LAB — VITAMIN D 25 HYDROXY (VIT D DEFICIENCY, FRACTURES): VITD: 23.75 ng/mL — ABNORMAL LOW (ref 30.00–100.00)

## 2020-11-19 NOTE — Patient Instructions (Signed)
-   Please stay hydrated  - Avoid over the counter calcium tablets including multivitamins - Make sure you are consuming calcium in your diet ( 2-3 servings a day )     24-Hour Urine Collection   You will be collecting your urine for a 24-hour period of time.  Your timer starts with your first urine of the morning (For example - If you first pee at 9AM, your timer will start at 9AM)  Throw away your first urine of the morning  Collect your urine every time you pee for the next 24 hours STOP your urine collection 24 hours after you started the collection (For example - You would stop at 9AM the day after you started)

## 2020-11-19 NOTE — Progress Notes (Signed)
Name: Linda [name]  MRN/ DOB: [id number], May 27, 1924    Age/ Sex: 87 y.o., female    PCP: Orland Mustard, MD   Reason for Endocrinology Evaluation: Hypercalcemia      Date of Initial Endocrinology Evaluation: 11/19/2020     HPI: Linda [name] is a 87 y.o. female with a past medical history of HTN, PAT and dyslipidemia . The patient presented for initial endocrinology clinic visit on 11/19/2020 for consultative assistance with her Hypercalcemia .    Pt is accompanied by her daughter today    Linda [name] indicates that she was first diagnosed with hypercalcemia in 2020, with a max level of 11.1 mg/dL in 1937 ( non-corrected) and a PTH level inappropriately normal at 29 pg/mL in 09/2020.  She  Denies experienced symptoms of constipation, polyuria, polydipsia, generalized weakness, diffuse muscle pains.  She is on multivitamin but on , lithium,or  HCTZ. She is on vitamin D supplements 1000 iu daily    CT Scan in 05/2020 did not show evidence of renal stones   She denies  kidney disease, liver disease, granulomatous disease. She denies  osteoporosis but had a fracture of the wrist and took antiresorptive therapy for ~ 5 yrs . Daily dietary calcium intake: 1 servings. She denies  family history of osteoporosis, parathyroid disease, thyroid disease.    THYROMEGALY: This was diagnosed many years ago. Had a benign FNA ~ 10 yrs, she is not sure if she is having local neck symptoms  Has odynophagia Occasional dysphagia      HISTORY:  Past Medical History:  Past Medical History:  Diagnosis Date  . Arthritis   . COPD (chronic obstructive pulmonary disease) (HCC)    chronic cough  . Glaucoma   . Hyperlipidemia   . Hypertension   . Irregular heartbeat   . Osteoporosis   . Thyroid disease    enlarged thyroid    Past Surgical History:  Past Surgical History:  Procedure Laterality Date  . APPENDECTOMY    . cateract surgery    . COLONOSCOPY     Done in [place]  yrs ago  . UPPER  GASTROINTESTINAL ENDOSCOPY  2019  . WRIST SURGERY        Social History:  reports that she quit smoking about 70 years ago. Her smoking use included cigarettes. She started smoking about 72 years ago. She has a 0.50 pack-year smoking history. She has never used smokeless tobacco. She reports previous alcohol use. She reports that she does not use drugs.  Family History: family history includes Heart disease in her brother, brother, father, and mother.   HOME MEDICATIONS: Allergies as of 11/19/2020   No Known Allergies     Medication List       Accurate as of November 19, 2020 12:46 PM. If you have any questions, ask your nurse or doctor.        clobetasol cream 0.05 % Commonly known as: TEMOVATE APPLY 1 APPLICATION TOPICALLY AS NEEDED   famotidine 20 MG tablet Commonly known as: PEPCID TAKE 1 TABLET TWICE A DAY   Fluad 0.5 ML Susy Generic drug: Influenza Vac A&B Surf Ant Adj   fluticasone 50 MCG/ACT nasal spray Commonly known as: FLONASE USE 1 SPRAY IN EACH NOSTRIL DAILY AS NEEDED FOR ALLERGIES OR RHINITIS   ketoconazole 2 % shampoo Commonly known as: NIZORAL Apply 1 application topically 2 (two) times a week.   multivitamin tablet Take 1 tablet by mouth daily.   rosuvastatin 20 MG tablet  Commonly known as: CRESTOR Take 1 tablet (20 mg total) by mouth daily.   sotalol 80 MG tablet Commonly known as: BETAPACE TAKE 1 TABLET EVERY 12 HOURS   timolol 0.25 % ophthalmic solution Commonly known as: TIMOPTIC   Vitamin D3 25 MCG (1000 UT) Caps Take 1 capsule by mouth daily.         REVIEW OF SYSTEMS: A comprehensive ROS was conducted with the patient and is negative except as per HPI     OBJECTIVE:  VS: BP 126/70   Pulse 70   Ht 5\' 5"  (1.651 m)   Wt 123 lb 4 oz (55.9 kg)   SpO2 95%   BMI 20.51 kg/m    Wt Readings from Last 3 Encounters:  11/18/20 122 lb 9.6 oz (55.6 kg)  11/10/20 124 lb (56.2 kg)  09/29/20 123 lb (55.8 kg)     EXAM: General: Pt  appears well and is in NAD  Neck: General: Supple without adenopathy. Thyroid: Left thyroid nodule appreciated   Lungs: Clear with good BS bilat with no rales, rhonchi, or wheezes  Heart: Auscultation: RRR.  Abdomen: Normoactive bowel sounds, soft, nontender, without masses or organomegaly palpable  Extremities: Gait and station: Normal gait  BL LE: No pretibial edema normal ROM and strength.  Mental Status: Judgment, insight: Intact Memory: Intact for recent and remote events Mood and affect: No depression, anxiety, or agitation     DATA REVIEWED: Results for PALMIRA, STICKLE (MRN [name]) as of 11/22/2020 14:20  Ref. Range 11/19/2020 14:17  Sodium Latest Ref Range: 135 - 145 mEq/L 133 (L)  Potassium Latest Ref Range: 3.5 - 5.1 mEq/L 4.2  Chloride Latest Ref Range: 96 - 112 mEq/L 97  CO2 Latest Ref Range: 19 - 32 mEq/L 30  Glucose Latest Ref Range: 70 - 99 mg/dL 01/19/2021 (H)  BUN Latest Ref Range: 6 - 23 mg/dL 13  Creatinine Latest Ref Range: 0.40 - 1.20 mg/dL 518  Calcium Latest Ref Range: 8.4 - 10.5 mg/dL 8.41 (H)  Calcium Ionized Latest Ref Range: 4.8 - 5.6 mg/dL 66.0 (H)  Albumin Latest Ref Range: 3.5 - 5.2 g/dL 3.9  GFR Latest Ref Range: >60.00 mL/min 63.91  VITD Latest Ref Range: 30.00 - 100.00 ng/mL 23.75 (L)  PTH, Intact Latest Ref Range: 16 - 77 pg/mL 50    CT neck 05/26/2020    IMPRESSION: 1. Prominently enlarged multinodular thyroid, particularly the left lobe, extending into the superior mediastinum and causing displacement of the trachea and superior esophagus to the right. Dominant necrotic nodule in the left lobe measuring up to 5.1 cm. 2. Decreased volume of the left cerebellar hemisphere with large developmental venous anomaly and a 6 mm enhancing focus. Correlate with brain MRI if clinically indicated.  ASSESSMENT/PLAN/RECOMMENDATIONS:   1. Hypercalcemia :  - Most likely Primary hyperparathyroidism  - I explained to the Patient that she needs further  evaluation to determine surgical candidacy, pt questioned surgery at her advance age, but I explained to her that it will ultimately be her decision and we can always treat her medically if needed  - Will proceed with 24 hr urinary excretion of calcium  - Will proceed with DXA for osteoporosis screening   Recommendations    -  Encouraged hydration  - AVOID CALCIUM SUPPLEMENTS, AVOID LOW CALCIUM DIET - Maintain normal dietary calcium intake (2-3 servings of dairy a day)     2. MNG :   - Her main complaint is odynophagia, we discussed the CT scan  results from 05/2020 with a left thyroid nodule 5.1 cm causing tracheal deviation, she also has esophageal deviation which could explain the odynophagia - I have recommended proceeding with thyroid ultrasound  - Most likely an FNA would be recommended given the size and it will be up to the pt/family how invasive they would like to be   3. Vitamin D Insufficiency  :  - Will increase Vitamin D to 2000 iu daily     Signed electronically by: Lyndle Herrlich, MD  [facility] Endocrinology  [facility] [address] [place] [place], [place] [address] Phone: [phone] FAX: [phone]   CC: Orland Mustard, MD [address] [place] [address] Phone: [phone] Fax: [phone]   Return to Endocrinology clinic as below: Future Appointments  Date Time Provider Department Center  11/19/2020  1:40 PM Abbey Veith, Konrad Dolores, MD [facility] None  12/02/2020 11:30 AM Orland Mustard, MD [facility]  01/04/2021 11:00 AM Janalyn Harder, MD [facility]

## 2020-11-22 LAB — CALCIUM, IONIZED: Calcium, Ion: 5.77 mg/dL — ABNORMAL HIGH (ref 4.8–5.6)

## 2020-11-22 LAB — PARATHYROID HORMONE, INTACT (NO CA): PTH: 50 pg/mL (ref 16–77)

## 2020-11-23 ENCOUNTER — Inpatient Hospital Stay: Admission: RE | Admit: 2020-11-23 | Payer: Medicare Other | Source: Ambulatory Visit

## 2020-12-02 ENCOUNTER — Encounter: Payer: Self-pay | Admitting: Family Medicine

## 2020-12-02 ENCOUNTER — Ambulatory Visit (INDEPENDENT_AMBULATORY_CARE_PROVIDER_SITE_OTHER): Payer: Medicare Other | Admitting: Family Medicine

## 2020-12-02 ENCOUNTER — Other Ambulatory Visit: Payer: Self-pay

## 2020-12-02 VITALS — BP 150/65 | HR 57 | Temp 97.2°F | Ht 65.0 in | Wt 122.8 lb

## 2020-12-02 DIAGNOSIS — I10 Essential (primary) hypertension: Secondary | ICD-10-CM

## 2020-12-02 NOTE — Progress Notes (Signed)
Patient: Linda [name] MRN: [id number] DOB: Dec 20, 1923 PCP: Orland Mustard, MD     Subjective:  Chief Complaint  Patient presents with  . Hypertension    2 weeks f/u; used pt's cuff in office today, reading was 148/68.     HPI: The patient is a 87 y.o. female who presents today for HTN. Pt says she still has stomach issues.. I saw her two weeks ago and her blood pressure was incredibly high; however, her home readings have been to goal. She feels better from when I saw her last time. No other complaints or issues. She remains asymptomatic.   Home log: 122/57, 138/55 and then 165/86. Just recently got a cuff. All her readings by PT have been normal at her [place].     Review of Systems  Constitutional: Negative for chills, fatigue and fever.  HENT: Negative for dental problem, ear pain, hearing loss and trouble swallowing.   Eyes: Negative for visual disturbance.  Respiratory: Negative for cough, chest tightness and shortness of breath.   Cardiovascular: Negative for chest pain, palpitations and leg swelling.  Gastrointestinal: Negative for abdominal pain, blood in stool, diarrhea and nausea.  Endocrine: Negative for cold intolerance, polydipsia, polyphagia and polyuria.  Genitourinary: Negative for dysuria and hematuria.  Musculoskeletal: Negative for arthralgias.  Skin: Negative for rash.  Neurological: Negative for dizziness and headaches.  Psychiatric/Behavioral: Negative for dysphoric mood and sleep disturbance. The patient is not nervous/anxious.     Allergies Patient has No Known Allergies.  Past Medical History Patient  has a past medical history of Arthritis, COPD (chronic obstructive pulmonary disease) (HCC), Glaucoma, Hyperlipidemia, Hypertension, Irregular heartbeat, Osteoporosis, and Thyroid disease.  Surgical History Patient  has a past surgical history that includes Appendectomy; Colonoscopy; cateract surgery; Wrist surgery; and Upper gastrointestinal endoscopy  (2019).  Family History Pateint's family history includes Heart disease in her brother, brother, father, and mother.  Social History Patient  reports that she quit smoking about 70 years ago. Her smoking use included cigarettes. She started smoking about 72 years ago. She has a 0.50 pack-year smoking history. She has never used smokeless tobacco. She reports previous alcohol use. She reports that she does not use drugs.    Objective: Vitals:   12/02/20 1136 12/02/20 1155  BP: (!) 175/71 (!) 150/65  Pulse: (!) 57   Temp: (!) 97.2 F (36.2 C)   TempSrc: Temporal   SpO2: 97%   Weight: 122 lb 12.8 oz (55.7 kg)   Height: 5\' 5"  (1.651 m)     Body mass index is 20.43 kg/m.  Physical Exam Vitals reviewed.  Constitutional:      Appearance: Normal appearance. She is normal weight.  HENT:     Head: Normocephalic and atraumatic.  Cardiovascular:     Rate and Rhythm: Normal rate and regular rhythm.     Heart sounds: Normal heart sounds.  Pulmonary:     Effort: Pulmonary effort is normal.     Breath sounds: Normal breath sounds.  Abdominal:     General: Abdomen is flat. Bowel sounds are normal.     Palpations: Abdomen is soft.  Neurological:     General: No focal deficit present.     Mental Status: She is alert.  Psychiatric:        Mood and Affect: Mood normal.        Behavior: Behavior normal.        Assessment/plan: 1. Essential hypertension Home readings to goal, good on repeat for her age. Will continue  to monitor for now. Gave parameters to daughter and to let me know.   As for her stomach. Sees GI. Has had CT/EGD done. Sounds like IBS and recommended she follow up with them. Glad she is feeling better though from when I saw her 2 weeks ago.      Return if symptoms worsen or fail to improve.    Orland Mustard, MD [facility] [place] [facility]   12/02/2020

## 2020-12-13 ENCOUNTER — Ambulatory Visit
Admission: RE | Admit: 2020-12-13 | Discharge: 2020-12-13 | Disposition: A | Discharge status: Home / Self Care | Payer: Medicare Other | Source: Ambulatory Visit | Attending: Internal Medicine | Admitting: Internal Medicine

## 2020-12-13 DIAGNOSIS — E042 Nontoxic multinodular goiter: Secondary | ICD-10-CM

## 2020-12-13 IMAGING — US US THYROID
1 series · 12 of 25 positions shown · non-contrast
Comparison: Neck CT-[DATE]

CLINICAL DATA: Incidental on CT.  Multinodular goiter on neck CT.

EXAM:
THYROID ULTRASOUND
TECHNIQUE: Ultrasound examination of the thyroid gland and adjacent soft
tissues was performed.

[Series 1: us thyroid · 0.07mm/px · 12 of 48 slices shown]
[im 2/48]
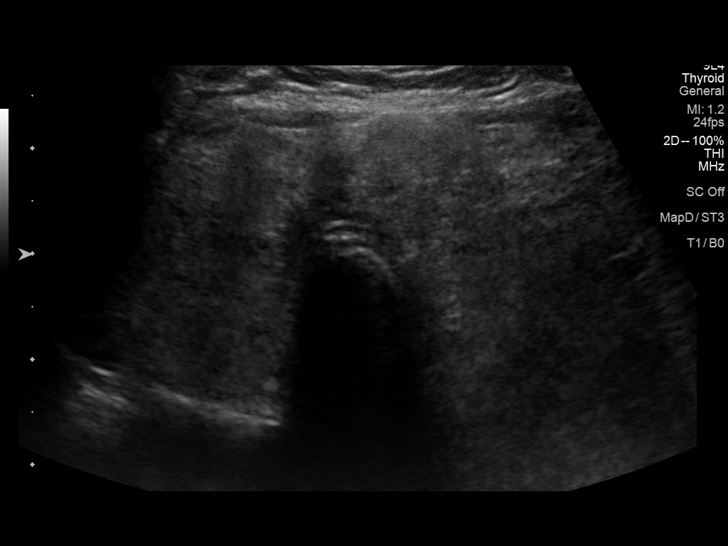
[im 6/48]
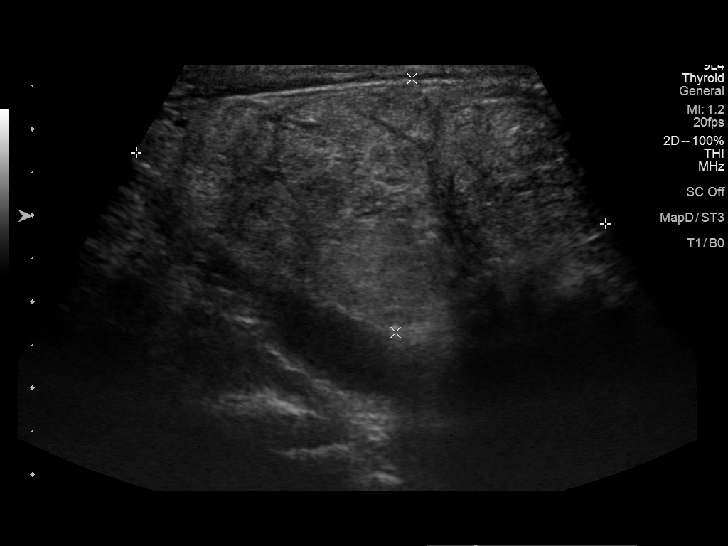
[im 10/48]
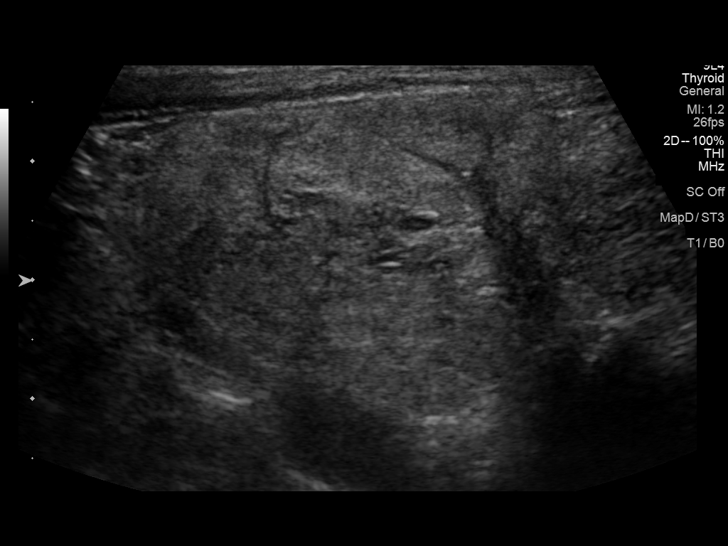
[im 14/48]
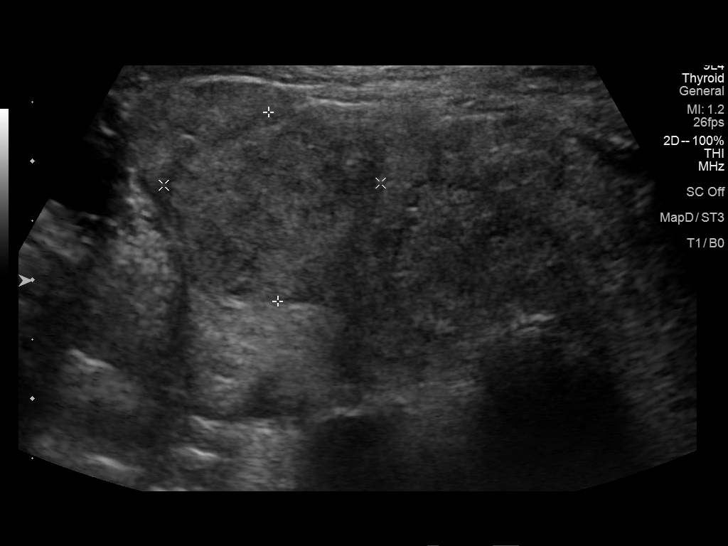
[im 18/48]
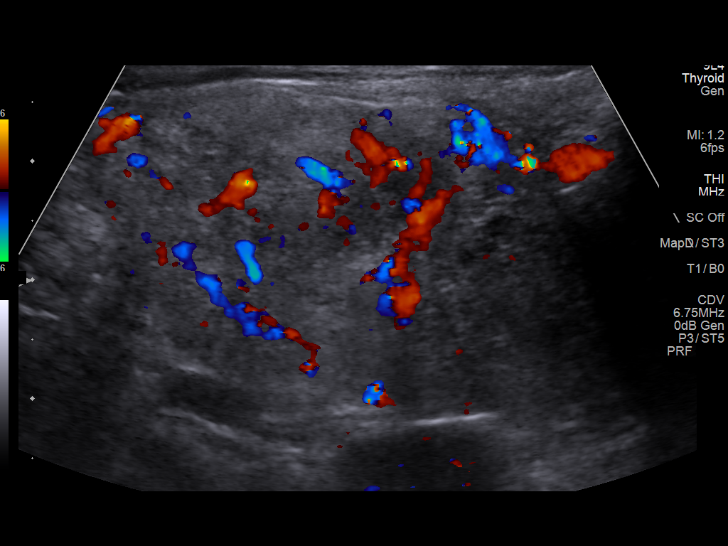
[im 22/48]
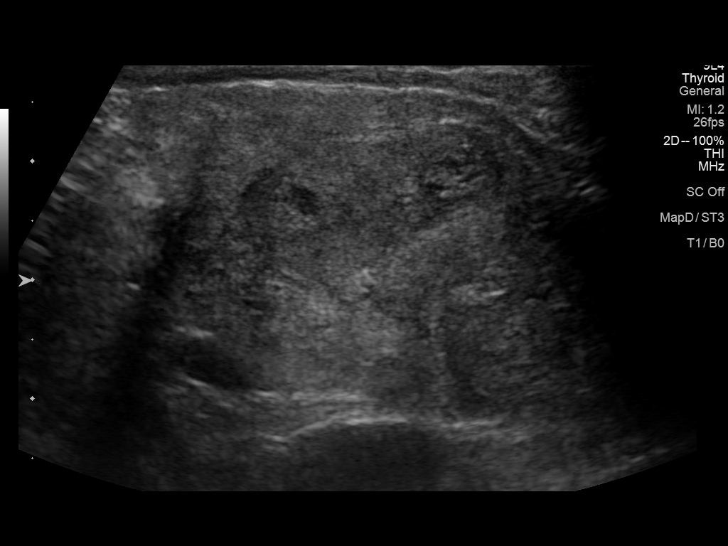
[im 26/48]
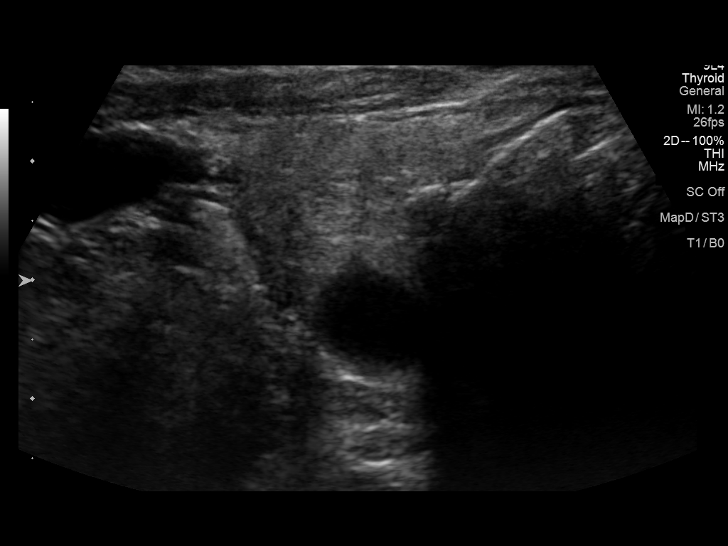
[im 30/48]
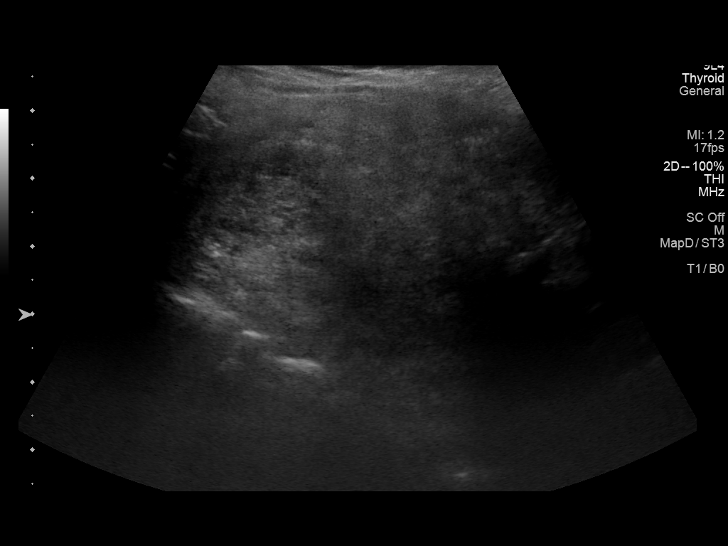
[im 34/48]
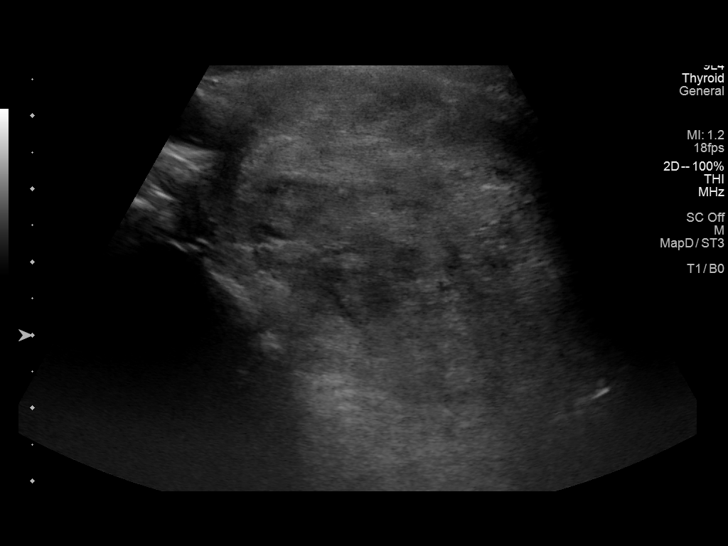
[im 38/48]
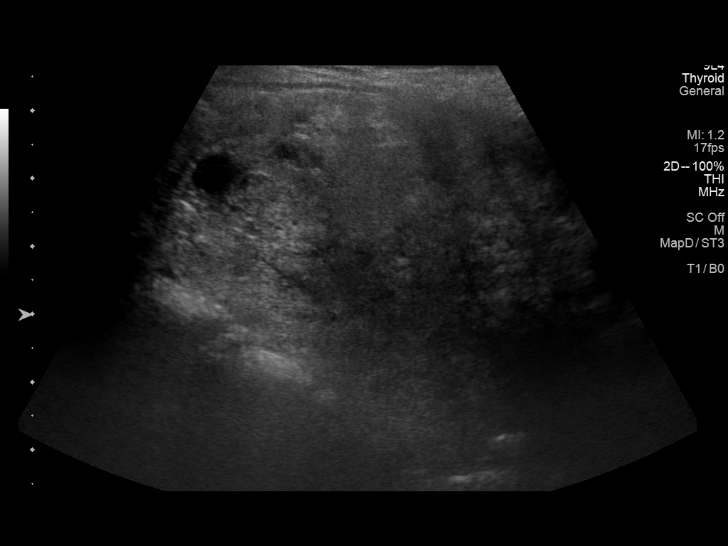
[im 42/48]
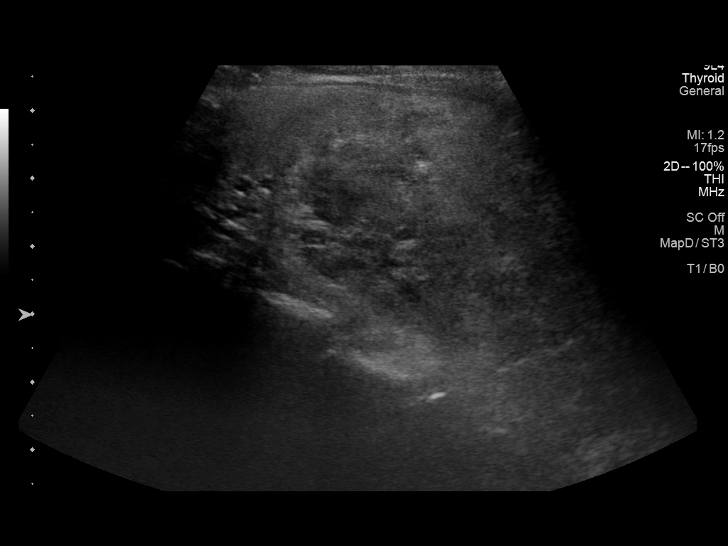
[im 46/48]
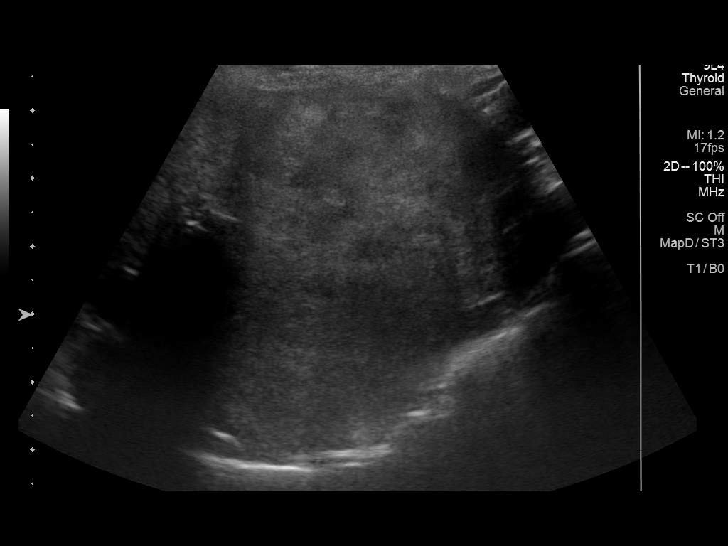

[12 of 25 positions shown; findings below may reference images not displayed]

FINDINGS: Parenchymal Echotexture: Moderately heterogenous

Isthmus: Enlarged measuring at least 8 mm in diameter.

Right lobe: Enlarged measuring 5.5 x 3.0 x 3.3 cm

Left lobe: Enlarged measuring 7.3 x 4.5 x 4.1 cm.

_________________________________________________________

Estimated total number of nodules >/= 1 cm: 4

Number of spongiform nodules >/=  2 cm not described below (TR1): 0

Number of mixed cystic and solid nodules >/= 1.5 cm not described
below (TR2): 0

_________________________________________________________

Nodule # 1:

Location: Right; Superior

Maximum size: 2.0 cm; Other 2 dimensions: 1.9 x 1.2 cm

Composition: solid/almost completely solid (2)

Echogenicity: isoechoic (1)

Shape: not taller-than-wide (0)

Margins: ill-defined (0)

Echogenic foci: none (0)

ACR TI-RADS total points: 3.

ACR TI-RADS risk category: TR3 (3 points).

ACR TI-RADS recommendations:

*Given size (>/= 1.5 - 2.4 cm) and appearance, a follow-up
ultrasound in 1 year should be considered based on TI-RADS criteria.

_________________________________________________________

Nodule # 2:

Location: Right; Superior

Maximum size: 2.2 cm; Other 2 dimensions: 1.8 x 1.6 cm

Composition: solid/almost completely solid (2)

Echogenicity: hypoechoic (2)

Shape: not taller-than-wide (0)

Margins: smooth (0)

Echogenic foci: none (0)

ACR TI-RADS total points: 4.

ACR TI-RADS risk category: TR4 (4-6 points).

ACR TI-RADS recommendations:

**Given size (>/= 1.5 cm) and appearance, fine needle aspiration of
this moderately suspicious nodule should be considered based on
TI-RADS criteria.

_________________________________________________________

Nodule # 3:

Location: Right; Inferior

Maximum size: 2.1 cm; Other 2 dimensions: 2.0 x 1.9 cm

Composition: solid/almost completely solid (2)

Echogenicity: hypoechoic (2)

Shape: not taller-than-wide (0)

Margins: ill-defined (0)

Echogenic foci: none (0)

ACR TI-RADS total points: 4.

ACR TI-RADS risk category: TR4 (4-6 points).

ACR TI-RADS recommendations:

**Given size (>/= 1.5 cm) and appearance, fine needle aspiration of
this moderately suspicious nodule should be considered based on
TI-RADS criteria.

_________________________________________________________

Nodule # 4:

Location: Left; Mid correlates with the dominant mass seen on
preceding neck CT

Maximum size: 5.5 cm; Other 2 dimensions: 4.2 x 4.2 cm

Composition: solid/almost completely solid (2)

Echogenicity: isoechoic (1)

Shape: not taller-than-wide (0)

Margins: ill-defined (0)

Echogenic foci: none (0)

ACR TI-RADS total points: 3.

ACR TI-RADS risk category: TR3 (3 points).

ACR TI-RADS recommendations:

**Given size (>/= 2.5 cm) and appearance, fine needle aspiration of
this mildly suspicious nodule should be considered based on TI-RADS
criteria.

_________________________________________________________
IMPRESSION: 1. Thyromegaly with findings suggestive of multinodular goiter.
2. Nodules labeled #2, #3 and #4 all meet imaging criteria to
recommend percutaneous sampling, however current imaging
recommendations are to only biopsy the two most worrisome thyroid
nodules at a given time. As such, nodules #2 and #4 could undergo
ultrasound-guided biopsy as indicated, however note, given this
patient's advanced age, an additional imaging strategies could
entail a 1 year follow-up as clinically indicated.
3. Nodule #1 meets imaging criteria to recommend a 1 year follow-up.

## 2020-12-14 ENCOUNTER — Telehealth: Payer: Self-pay | Admitting: Internal Medicine

## 2020-12-14 NOTE — Telephone Encounter (Signed)
Left a message for a call back on 12/14/2020 at 1210 pm.    A portal message will be sent   Abby Raelyn Mora, MD  [facility] Endocrinology  [facility] [address] [place], [place] [address] Phone: [phone] FAX: [phone]

## 2020-12-17 ENCOUNTER — Telehealth: Payer: Self-pay | Admitting: Internal Medicine

## 2020-12-17 NOTE — Telephone Encounter (Signed)
Spoke to the pt on 12/17/2020. Discussed ultrasound results with MNG, discussed recommendations of proceeding with FNA    She is not sure at this time if she would like to proceed with this due to advance age.  She will discuss with daughter and let me know She understands that a thyroid cancer can not be excluded without a biopsy.     Abby Raelyn Mora, MD  [facility] Endocrinology  [facility] [address] [place], [place] [address] Phone: [phone] FAX: [phone]

## 2020-12-17 NOTE — Telephone Encounter (Signed)
Left a message for the pt to call back as I missed her call yesterday  Called on 12/17/2020 at 950 AM    Abby Raelyn Mora, MD  [facility] Endocrinology  [facility] [address] [place], [place] [address] Phone: [phone] FAX: [phone]

## 2020-12-20 ENCOUNTER — Telehealth: Payer: Self-pay

## 2020-12-20 NOTE — Telephone Encounter (Signed)
VOB has been submitted for Monovisc, bilateral knee. Pending BV. 

## 2020-12-22 ENCOUNTER — Other Ambulatory Visit: Payer: Self-pay

## 2020-12-22 ENCOUNTER — Ambulatory Visit (INDEPENDENT_AMBULATORY_CARE_PROVIDER_SITE_OTHER)
Admission: RE | Admit: 2020-12-22 | Discharge: 2020-12-22 | Disposition: A | Discharge status: Home / Self Care | Payer: Medicare Other | Source: Ambulatory Visit | Attending: Internal Medicine | Admitting: Internal Medicine

## 2020-12-22 ENCOUNTER — Telehealth: Payer: Self-pay

## 2020-12-22 DIAGNOSIS — E213 Hyperparathyroidism, unspecified: Secondary | ICD-10-CM | POA: Diagnosis not present

## 2020-12-22 NOTE — Telephone Encounter (Signed)
Called and left a message for patient to call back to schedule an appointment with Dr. Junius Roads for gel injections.  Approved for Monovisc, bilateral knee. [place] deductible has been met Secondary insurance [employer] will pick up remaining eligible expenses at 100%. No Co-pay No PA required

## 2021-01-04 ENCOUNTER — Ambulatory Visit (INDEPENDENT_AMBULATORY_CARE_PROVIDER_SITE_OTHER): Payer: Medicare Other | Admitting: Dermatology

## 2021-01-04 ENCOUNTER — Encounter: Payer: Self-pay | Admitting: Dermatology

## 2021-01-04 ENCOUNTER — Encounter: Payer: Self-pay | Admitting: Family Medicine

## 2021-01-04 ENCOUNTER — Other Ambulatory Visit: Payer: Self-pay

## 2021-01-04 DIAGNOSIS — Z1283 Encounter for screening for malignant neoplasm of skin: Secondary | ICD-10-CM

## 2021-01-04 DIAGNOSIS — I6529 Occlusion and stenosis of unspecified carotid artery: Secondary | ICD-10-CM

## 2021-01-04 DIAGNOSIS — Q828 Other specified congenital malformations of skin: Secondary | ICD-10-CM

## 2021-01-06 ENCOUNTER — Ambulatory Visit: Payer: Medicare Other

## 2021-01-18 ENCOUNTER — Encounter: Payer: Self-pay | Admitting: Dermatology

## 2021-01-18 NOTE — Progress Notes (Signed)
   New Patient   Subjective  Linda [name] is a 87 y.o. female who presents for the following: Annual Exam (Patient has a list and don't want to be undressed ).  Several areas to check, most concern is a spot for the past 1+ year on the left lower neck Location:  Duration:  Quality:  Associated Signs/Symptoms: Modifying Factors:  Severity:  Timing: Context:    The following portions of the chart were reviewed this encounter and updated as appropriate:  Tobacco  Allergies  Meds  Problems  Med Hx  Surg Hx  Fam Hx      Objective  Well appearing patient in no apparent distress; mood and affect are within normal limits. Objective  Left Anterior Neck: 1 cm flat tan-pink keratotic papule with sharply defined edge suggestive of solitary porokeratosis.  Historically stable.  Objective  Left Forearm - Posterior, Right Forearm - Posterior: Limited examination per patient preference.  Sun exposed areas plus back examined; multiple benign keratoses on torso.  No atypical pigmented lesions or recurrent nonmelanoma skin cancer.    A focused examination was performed including Head, neck, back, upper chest, arms.. Relevant physical exam findings are noted in the Assessment and Plan.   Assessment & Plan  Porokeratosis Left Anterior Neck  Family told that this type lesion is similar to large actinic keratosis with a rather small risk of evolution to nonmelanoma skin cancer.  They are comfortable leaving this unless there is clinical change.  Encounter for screening for malignant neoplasm of skin (2) Left Forearm - Posterior; Right Forearm - Posterior  I would be pleased to see Linda [name] on either an annual or as needed basis in the future.

## 2021-01-20 ENCOUNTER — Other Ambulatory Visit: Payer: Self-pay

## 2021-02-01 ENCOUNTER — Telehealth: Payer: Self-pay

## 2021-02-01 NOTE — Telephone Encounter (Signed)
Patient states she hates to have to switch offices again with her mother so used to coming to [facility]. Would like to know if you know of any good female doctors at [facility] who her mom could look into seeing. Says [place] is too far for them to drive

## 2021-02-01 NOTE — Telephone Encounter (Signed)
Patient also wants a MD and not a PA. The only MD accepting patients at [facility] is Dr.Hernandez. Are you familiar with her?

## 2021-02-01 NOTE — Telephone Encounter (Signed)
Patients daughter cheryl is requesting to transfer from [place] to [place]. I explained to her dr.andy is not accepting new patients at the moment. She still requested I asked. Please return call  [phone]

## 2021-02-02 NOTE — Telephone Encounter (Signed)
Patient called Linda [name] and they stated Dr.Hernandez is no longer accepting new patients either. Patient has chose to get her mom in with Dr.Parker at [facility].

## 2021-03-03 ENCOUNTER — Telehealth: Payer: Self-pay | Admitting: Family Medicine

## 2021-03-03 NOTE — Telephone Encounter (Signed)
error 

## 2021-03-08 ENCOUNTER — Encounter: Payer: Self-pay | Admitting: Family Medicine

## 2021-03-09 ENCOUNTER — Other Ambulatory Visit: Payer: Self-pay

## 2021-03-09 DIAGNOSIS — K219 Gastro-esophageal reflux disease without esophagitis: Secondary | ICD-10-CM

## 2021-03-09 MED ORDER — FAMOTIDINE 20 MG PO TABS: 20.0000 mg | ORAL_TABLET | Freq: Two times a day (BID) | ORAL | 3 refills | Status: DC

## 2021-03-09 MED ORDER — ROSUVASTATIN CALCIUM 20 MG PO TABS: 20.0000 mg | ORAL_TABLET | Freq: Every day | ORAL | 3 refills | Status: DC

## 2021-03-10 ENCOUNTER — Ambulatory Visit (INDEPENDENT_AMBULATORY_CARE_PROVIDER_SITE_OTHER): Payer: Medicare Other | Admitting: Family Medicine

## 2021-03-10 ENCOUNTER — Other Ambulatory Visit: Payer: Self-pay

## 2021-03-10 DIAGNOSIS — M170 Bilateral primary osteoarthritis of knee: Secondary | ICD-10-CM | POA: Diagnosis not present

## 2021-03-10 DIAGNOSIS — M1712 Unilateral primary osteoarthritis, left knee: Secondary | ICD-10-CM

## 2021-03-10 DIAGNOSIS — M1711 Unilateral primary osteoarthritis, right knee: Secondary | ICD-10-CM

## 2021-03-10 NOTE — Progress Notes (Signed)
Subjective: She is here for planned bilateral Monovisc injections for osteoarthritis.  Objective: Trace effusion bilaterally with no warmth or erythema.  Procedure: Bilateral knee injections: After sterile prep with Betadine, injected 3 cc 1% lidocaine without epinephrine and Monovisc from lateral midpatellar approach.  Follow-up as needed.

## 2021-03-22 ENCOUNTER — Other Ambulatory Visit: Payer: Self-pay

## 2021-03-22 ENCOUNTER — Ambulatory Visit (INDEPENDENT_AMBULATORY_CARE_PROVIDER_SITE_OTHER): Payer: Medicare Other | Admitting: Internal Medicine

## 2021-03-22 ENCOUNTER — Encounter: Payer: Self-pay | Admitting: Internal Medicine

## 2021-03-22 VITALS — BP 120/80 | HR 72 | Temp 98.2°F | Ht 65.0 in | Wt 126.6 lb

## 2021-03-22 DIAGNOSIS — A310 Pulmonary mycobacterial infection: Secondary | ICD-10-CM | POA: Diagnosis not present

## 2021-03-22 DIAGNOSIS — R053 Chronic cough: Secondary | ICD-10-CM | POA: Diagnosis not present

## 2021-03-22 NOTE — Patient Instructions (Addendum)
Please schedule follow up scheduled with myself in 6 months.  If my schedule is not open yet, we will contact you with a reminder closer to that time.   Try mouth drops, lubricating mouth wash, tea with honey to help with the cough and dry mouth.

## 2021-03-22 NOTE — Progress Notes (Signed)
Linda [name]    [id number]    01/01/1924  Primary Care Physician:Wolfe, Ebony Hail, MD Date of Appointment: 11/10/2020 Established Patient Visit  Chief complaint:   Chief Complaint  Patient presents with   Follow-up    SOB/ whezzing/ productive cough increased      HPI: Linda [name] is a 87 y.o. woman with history of bronchiectasis and MAI.   Interval Updates: Here with worsening cough. Her cough is now waking her up at night. Dry, no mucus.  Denies shortness of breath, but she is minimally active. Daughter notes that she has dyspnea after a long coughing fit. Has episodes like this usually in the morning. No hemoptysis.  No fevers, chills, no night sweats, or weight loss.   Last treated with antibiotics was for a respiratory infection. She also notes having dry mouth and thickened saliva which she has difficulty clearing. Daughter notes she was given some gum for this by dentist which helps, but she stopped taking it.   I have reviewed the patient's family social and past medical history and updated as appropriate.   Past Medical History:  Diagnosis Date   Arthritis    COPD (chronic obstructive pulmonary disease) (HCC)    chronic cough   Glaucoma    Hyperlipidemia    Hypertension    Irregular heartbeat    Osteoporosis    Thyroid disease    enlarged thyroid    Past Surgical History:  Procedure Laterality Date   APPENDECTOMY     cateract surgery     COLONOSCOPY     Done in [place]  yrs ago   UPPER GASTROINTESTINAL ENDOSCOPY  2019   WRIST SURGERY      Family History  Problem Relation Age of Onset   Heart disease Mother    Heart disease Father    Heart disease Brother    Heart disease Brother    Colon cancer Neg Hx    Colon polyps Neg Hx    Esophageal cancer Neg Hx     Social History   Occupational History   Occupation: Retired  Tobacco Use   Smoking status: Former Smoker    Packs/day: 0.25    Years: 2.00    Pack years: 0.50    Types:  Cigarettes    Start date: 1950    Quit date: 1952    Years since quitting: 70.2   Smokeless tobacco: Never Used  Scientific laboratory technician Use: Never used  Substance and Sexual Activity   Alcohol use: Not Currently   Drug use: Never   Sexual activity: Not on file     Physical Exam: Blood pressure 128/78, pulse 70, temperature (!) 97.3 F (36.3 C), height _0  (1.651 m), weight 124 lb (56.2 kg), SpO2 94 %.  Gen:      No acute distress, thin, well preserved  Lungs:    Kyphosis, No increased respiratory effort, symmetric chest wall excursion, clear to auscultation bilaterally, no wheezes or crackles CV:         Regular rate and rhythm; no murmurs, rubs, or gallops. Thin,  No pedal edema   Data Reviewed: Imaging: I have personally reviewed the CT Chest October 2021 which shows bronchiectasis with nodule densities consistent with MAI infection.  PFTs:  PFT Results Latest Ref Rng & Units 07/14/2019  FVC-Pre L 1.83  FVC-Predicted Pre % 91  FVC-Post L 1.75  FVC-Predicted Post % 87  Pre FEV1/FVC % % 70  Post  FEV1/FCV % % 78  FEV1-Pre L 1.29  FEV1-Predicted Pre % 88  FEV1-Post L 1.36  DLCO uncorrected ml/min/mmHg 12.92  TLC L 4.36  TLC % Predicted % 86  RV % Predicted % 99   I have personally reviewed the patient's PFTs and there is mild airflow limitation without BD response. Lung volumes normal, diffusion capacity is moderately reduced.   Labs:  Immunization status: Immunization History  Administered Date(s) Administered   Influenza, High Dose Seasonal PF 05/15/2019   Influenza-Unspecified 06/04/2018, 05/07/2020   PFIZER(Purple Top)SARS-COV-2 Vaccination 08/28/2019, 09/17/2019, 06/02/2020   PNEUMOCOCCAL CONJUGATE-20 10/19/2020    Assessment:  Chronic cough Bronchiectasis without exacerbation Concern for Mycobacterium Avium Complex infection  Plan/Recommendations: Chronic cough is likely secondary to ongoing MAI infection. We discussed obtaining sputum cultures as  previously discussed - she doesn't feel she is bringing up enough. Discussed option for bronchoscopy which she would not want to go through. Also discussed treatment for MAI potentially including 4 drugs for 12 months - that does not sound palatable for her either. After review of risks and benefits would elect for watchful waiting and if she develops worsening dyspnea, cough, hemoptysis, fever, decreased appetite we would consider a course of abx 7-10 days to try to reduce the burden of bacteria while acknowledging risks for resistance and that it will never completely clear.  Regarding dry mouth  - consider OTC mouth wash, honey can help with natural cough suppression.   I spent 30 minutes in the care of this patient today including pre-charting, chart review, review of results, face-to-face care, coordination of care and communication with consultants etc.).  Return to Care: Return in about 6 months (around 05/13/2021).   Lenice Llamas, MD Pulmonary and [place]

## 2021-03-23 ENCOUNTER — Ambulatory Visit: Payer: Medicare Other | Admitting: Internal Medicine

## 2021-04-01 ENCOUNTER — Encounter: Payer: Self-pay | Admitting: Family Medicine

## 2021-04-04 ENCOUNTER — Encounter: Payer: Self-pay | Admitting: Family Medicine

## 2021-04-08 ENCOUNTER — Ambulatory Visit: Payer: Medicare Other | Admitting: Internal Medicine

## 2021-04-13 ENCOUNTER — Encounter: Payer: Self-pay | Admitting: Family Medicine

## 2021-04-13 ENCOUNTER — Ambulatory Visit (INDEPENDENT_AMBULATORY_CARE_PROVIDER_SITE_OTHER): Payer: Medicare Other | Admitting: Family Medicine

## 2021-04-13 ENCOUNTER — Other Ambulatory Visit: Payer: Self-pay

## 2021-04-13 DIAGNOSIS — E782 Mixed hyperlipidemia: Secondary | ICD-10-CM

## 2021-04-13 DIAGNOSIS — I10 Essential (primary) hypertension: Secondary | ICD-10-CM

## 2021-04-13 DIAGNOSIS — J479 Bronchiectasis, uncomplicated: Secondary | ICD-10-CM

## 2021-04-13 DIAGNOSIS — E611 Iron deficiency: Secondary | ICD-10-CM

## 2021-04-13 DIAGNOSIS — G8929 Other chronic pain: Secondary | ICD-10-CM

## 2021-04-13 DIAGNOSIS — M546 Pain in thoracic spine: Secondary | ICD-10-CM | POA: Diagnosis not present

## 2021-04-13 DIAGNOSIS — I4719 Other supraventricular tachycardia: Secondary | ICD-10-CM

## 2021-04-13 DIAGNOSIS — E213 Hyperparathyroidism, unspecified: Secondary | ICD-10-CM | POA: Diagnosis not present

## 2021-04-13 DIAGNOSIS — R5383 Other fatigue: Secondary | ICD-10-CM

## 2021-04-13 DIAGNOSIS — I471 Supraventricular tachycardia: Secondary | ICD-10-CM

## 2021-04-13 DIAGNOSIS — M533 Sacrococcygeal disorders, not elsewhere classified: Secondary | ICD-10-CM

## 2021-04-13 DIAGNOSIS — L309 Dermatitis, unspecified: Secondary | ICD-10-CM | POA: Insufficient documentation

## 2021-04-13 DIAGNOSIS — E559 Vitamin D deficiency, unspecified: Secondary | ICD-10-CM

## 2021-04-13 LAB — COMPREHENSIVE METABOLIC PANEL
ALT: 12 U/L (ref 0–35)
AST: 22 U/L (ref 0–37)
Albumin: 4.2 g/dL (ref 3.5–5.2)
Alkaline Phosphatase: 81 U/L (ref 39–117)
BUN: 14 mg/dL (ref 6–23)
CO2: 28 mEq/L (ref 19–32)
Calcium: 10.7 mg/dL — ABNORMAL HIGH (ref 8.4–10.5)
Chloride: 101 mEq/L (ref 96–112)
Creatinine, Ser: 0.69 mg/dL (ref 0.40–1.20)
GFR: 73.94 mL/min (ref >60.00)
Glucose, Bld: 83 mg/dL (ref 70–99)
Potassium: 4.2 mEq/L (ref 3.5–5.1)
Sodium: 135 mEq/L (ref 135–145)
Total Bilirubin: 0.4 mg/dL (ref 0.2–1.2)
Total Protein: 7.4 g/dL (ref 6.0–8.3)

## 2021-04-13 LAB — IBC + FERRITIN
Ferritin: 65.2 ng/mL (ref 10.0–291.0)
Iron: 68 ug/dL (ref 42–145)
Saturation Ratios: 16.4 % — ABNORMAL LOW (ref 20.0–50.0)
TIBC: 414.4 ug/dL (ref 250.0–450.0)
Transferrin: 296 mg/dL (ref 212.0–360.0)

## 2021-04-13 LAB — CBC
HCT: 39.5 % (ref 36.0–46.0)
Hemoglobin: 13.1 g/dL (ref 12.0–15.0)
MCHC: 33.1 g/dL (ref 30.0–36.0)
MCV: 96.1 fl (ref 78.0–100.0)
Platelets: 217 10*3/uL (ref 150.0–400.0)
RBC: 4.12 Mil/uL (ref 3.87–5.11)
RDW: 12.9 % (ref 11.5–15.5)
WBC: 6.5 10*3/uL (ref 4.0–10.5)

## 2021-04-13 LAB — VITAMIN D 25 HYDROXY (VIT D DEFICIENCY, FRACTURES): VITD: 24.53 ng/mL — ABNORMAL LOW (ref 30.00–100.00)

## 2021-04-13 LAB — TSH: TSH: 0.87 u[IU]/mL (ref 0.35–5.50)

## 2021-04-13 NOTE — Assessment & Plan Note (Signed)
Uses clobetasol as needed.  Also use topical CeraVe lotion.  No apparent rashes on exam today.  She will take a picture next time she has a rash and send it via MyChart.

## 2021-04-13 NOTE — Assessment & Plan Note (Signed)
Sinus rhythm today.  She declined referral to cardiology.  Continue sotalol 80 mg twice daily.

## 2021-04-13 NOTE — Assessment & Plan Note (Signed)
Continue management for sports medicine.

## 2021-04-13 NOTE — Assessment & Plan Note (Signed)
Follows with pulmonology.  Was diagnosed with MAI however declined antibiotics.  Continued with supportive care.  Normal lung exam today.

## 2021-04-13 NOTE — Assessment & Plan Note (Signed)
Has seen endocrinology.  Does not wish to have further work-up at this point.  We will check calcium with c-Met.

## 2021-04-13 NOTE — Assessment & Plan Note (Signed)
Elevated today though at home readings typically in the 140s to 150s over 50s to 60s.  Discussed difficulty with treating isolated systolic hypertension.  We will continue with watchful waiting.  Will avoid adding on antihypertensives at this point due to concerns for provoking syncopal episode.  Asked patient to check a few standing blood pressures at home as well.

## 2021-04-13 NOTE — Patient Instructions (Signed)
It was very nice to see you today!  We will check blood work today.  Please keep an eye on standing blood pressures.  Let me know if it continues to be persistently elevated.  No medication changes today.  I will see you back in 4 months for your next checkup.  Come back to see me sooner if needed.  Take care, Dr Jimmey Ralph  PLEASE NOTE:  If you had any lab tests please let us know if you have not heard back within a few days. You may see your results on mychart before we have a chance to review them but we will give you a call once they are reviewed by [place]. If we ordered any referrals today, please let us know if you have not heard from their office within the next week.   Please try these tips to maintain a healthy lifestyle:  Eat at least 3 REAL meals and 1-2 snacks per day.  Aim for no more than 5 hours between eating.  If you eat breakfast, please do so within one hour of getting up.   Each meal should contain half fruits/vegetables, one quarter protein, and one quarter carbs (no bigger than a computer mouse)  Cut down on sweet beverages. This includes juice, soda, and sweet tea.   Drink at least 1 glass of water with each meal and aim for at least 8 glasses per day  Exercise at least 150 minutes every week.

## 2021-04-13 NOTE — Assessment & Plan Note (Signed)
Check calcium and c-Met. 

## 2021-04-13 NOTE — Assessment & Plan Note (Signed)
Follows with orthopedics.  Can consider referral to physiatry at some point in the future.

## 2021-04-13 NOTE — Assessment & Plan Note (Signed)
Crestor 20 mg daily.  Tolerating well.

## 2021-04-13 NOTE — Progress Notes (Signed)
Linda [name] is a 87 y.o. female who presents today for an office visit.  Assessment/Plan:  Chronic Problems Addressed Today: Dermatitis Uses clobetasol as needed.  Also use topical CeraVe lotion.  No apparent rashes on exam today.  She will take a picture next time she has a rash and send it via MyChart.  Hyperparathyroidism [facility]) Has seen endocrinology.  Does not wish to have further work-up at this point.  We will check calcium with c-Met.  Hypercalcemia Check calcium and c-Met.  Bronchiectasis without acute exacerbation ([place]) Follows with pulmonology.  Was diagnosed with MAI however declined antibiotics.  Continued with supportive care.  Normal lung exam today.  Paroxysmal atrial tachycardia (HCC) Sinus rhythm today.  She declined referral to cardiology.  Continue sotalol 80 mg twice daily.  Coccyalgia Follows with orthopedics.  Can consider referral to physiatry at some point in the future.  Essential hypertension Elevated today though at home readings typically in the 140s to 150s over 50s to 60s.  Discussed difficulty with treating isolated systolic hypertension.  We will continue with watchful waiting.  Will avoid adding on antihypertensives at this point due to concerns for provoking syncopal episode.  Asked patient to check a few standing blood pressures at home as well.  Chronic midline thoracic back pain Continue management for sports medicine.  Hyperlipidemia Crestor 20 mg daily.  Tolerating well.     Subjective:  HPI:  Patient here with her daughter.  She is transferring care.  Previous PCP left the office.  She complains that her legs and feet are always swollen. Her daughter states that the issue has become worse recently.  She feels cold most of the time, stating it is freezing.   Her daughter expresses concerns about her elevated calcium levels.  Back pain and knee pain, stating she has a torn meniscus in her left leg which she takes tylenol for,  which manages the pain well enough. She also sees a sports medicine doctor for her knees. Her back is a longstanding issue which has not been relieved with any sort of treatments including injections. This occurred after she had tripped and fell, which caused her to fall hard on her coccyx, where the pain afterwards started, and is still located.  Also, they express concern about her blood pressure fluctuations. It can go from 177/99  one reading, to 147/66 another reading, at rest.  She experiences sensitivity to sunlight. On the base of her neck when she does exercise, or otherwise it out in the sun, a rash forms which is sensitive and itchy which she treats with clobetasol. Additionally, she occasionally has a rash across her lower left abdomen, which the clobetasol was prescribed for.  She denies having had palpitations since taking the medication to manage them.  There is a cough that she has been dealing with, and experiences soreness and coughing up phlegm. Apparently she has something growing in her lungs but she has not coughed up anything to get a culture of to treat properly.  ROS: Per HPI, otherwise a complete review of systems was negative.   PMH:  The following were reviewed and entered/updated in epic: Past Medical History:  Diagnosis Date   Arthritis    COPD (chronic obstructive pulmonary disease) (HCC)    chronic cough   Glaucoma    Hyperlipidemia    Hypertension    Irregular heartbeat    Osteoporosis    Thyroid disease    enlarged thyroid   Patient Active Problem List  Diagnosis Date Noted   Dermatitis 04/13/2021   Multinodular goiter 11/19/2020   Hypercalcemia 11/19/2020   Hyperparathyroidism ([place]) 11/19/2020   Bronchiectasis without acute exacerbation ([place]) 07/14/2019   Chronic cough 02/03/2019   Thoracic back pain 10/13/2018   Hyperlipidemia 10/13/2018   Vitamin D deficiency 10/13/2018   Gastroesophageal reflux disease without esophagitis 10/13/2018    Chronic midline thoracic back pain 10/13/2018   Osteoporosis without current pathological fracture 10/13/2018   Essential hypertension 10/13/2018   Stenosis of carotid artery 10/13/2018   History of thyroid nodule, s/p bx, benign 10/13/2018   Aortic cusp regurgitation 10/13/2018   Nonrheumatic tricuspid valve regurgitation 10/13/2018   Coccyalgia 10/13/2018   Irritable bowel syndrome 10/13/2018   Bilateral hearing loss 10/13/2018   Chronic rhinitis 10/13/2018   Paroxysmal atrial tachycardia ([place]) 10/13/2018   Past Surgical History:  Procedure Laterality Date   APPENDECTOMY     cateract surgery     COLONOSCOPY     Done in [place]  yrs ago   UPPER GASTROINTESTINAL ENDOSCOPY  2019   WRIST SURGERY      Family History  Problem Relation Age of Onset   Heart disease Mother    Heart disease Father    Heart disease Brother    Heart disease Brother    Colon cancer Neg Hx    Colon polyps Neg Hx    Esophageal cancer Neg Hx     Medications- reviewed and updated Current Outpatient Medications  Medication Sig Dispense Refill   Cholecalciferol (VITAMIN D3) 25 MCG (1000 UT) CAPS Take 1 capsule by mouth daily.     clobetasol cream (TEMOVATE) 7.07 % APPLY 1 APPLICATION TOPICALLY AS NEEDED 60 g 5   famotidine (PEPCID) 20 MG tablet Take 1 tablet (20 mg total) by mouth 2 (two) times daily. 180 tablet 3   FLUAD 0.5 ML SUSY      fluticasone (FLONASE) 50 MCG/ACT nasal spray USE 1 SPRAY IN EACH NOSTRIL DAILY AS NEEDED FOR ALLERGIES OR RHINITIS 48 g 5   rosuvastatin (CRESTOR) 20 MG tablet Take 1 tablet (20 mg total) by mouth daily. 90 tablet 3   sotalol (BETAPACE) 80 MG tablet TAKE 1 TABLET EVERY 12 HOURS 180 tablet 3   timolol (TIMOPTIC) 0.25 % ophthalmic solution      No current facility-administered medications for this visit.    Allergies-reviewed and updated No Known Allergies  Social History   Socioeconomic History   Marital status: Widowed    Spouse name: Not on file   Number of  children: Not on file   Years of education: Not on file   Highest education level: Not on file  Occupational History   Occupation: Retired  Tobacco Use   Smoking status: Former    Packs/day: 0.25    Years: 2.00    Pack years: 0.50    Types: Cigarettes    Start date: 1950    Quit date: 1952    Years since quitting: 70.7   Smokeless tobacco: Never  Vaping Use   Vaping Use: Never used  Substance and Sexual Activity   Alcohol use: Not Currently   Drug use: Never   Sexual activity: Not on file  Other Topics Concern   Not on file  Social History Narrative   Resident at [employer]    Enjoys playing cards    Social Determinants of Health   Financial Resource Strain: Not on file  Food Insecurity: Not on file  Transportation Needs: Not on file  Physical Activity:  Not on file  Stress: Not on file  Social Connections: Not on file           Objective:  Physical Exam: BP (!) 174/61   Pulse 60   Temp 97.6 F (36.4 C) (Temporal)   Ht 5' 5"  (1.651 m)   Wt 125 lb 6.4 oz (56.9 kg)   SpO2 96%   BMI 20.87 kg/m   Gen: No acute distress, resting comfortably CV: Regular rate and rhythm with no murmurs appreciated Pulm: Normal work of breathing, clear to auscultation bilaterally with no crackles, wheezes, or rhonchi Neuro: Grossly normal, moves all extremities Psych: Normal affect and thought content      I,Jordan Kelly,acting as a scribe for Dimas Chyle, MD.,have documented all relevant documentation on the behalf of Dimas Chyle, MD,as directed by  Dimas Chyle, MD while in the presence of Dimas Chyle, MD.  I, Dimas Chyle, MD, have reviewed all documentation for this visit. The documentation on 04/13/21 for the exam, diagnosis, procedures, and orders are all accurate and complete.  Time Spent: 45 minutes of total time was spent on the date of the encounter performing the following actions: chart review prior to seeing the patient including recent visits with previous  PCP and specialists, obtaining history, performing a medically necessary exam, counseling on the treatment plan, placing orders, and documenting in our EHR.    Algis Greenhouse. Jerline Pain, MD 04/13/2021 11:46 AM

## 2021-04-15 NOTE — Progress Notes (Signed)
Please inform patient of the following:  Vitamin D is a bit low but everything else is stable. She should be taking 2000 IU of vitamin D daily. We can recheck again in a few months.

## 2021-04-19 ENCOUNTER — Encounter: Payer: Self-pay | Admitting: Family Medicine

## 2021-04-21 ENCOUNTER — Other Ambulatory Visit: Payer: Self-pay

## 2021-04-21 ENCOUNTER — Encounter: Payer: Self-pay | Admitting: Family Medicine

## 2021-04-21 DIAGNOSIS — Z0110 Encounter for examination of ears and hearing without abnormal findings: Secondary | ICD-10-CM

## 2021-05-24 ENCOUNTER — Encounter: Payer: Self-pay | Admitting: Family Medicine

## 2021-05-24 DIAGNOSIS — F341 Dysthymic disorder: Secondary | ICD-10-CM

## 2021-05-24 DIAGNOSIS — I471 Supraventricular tachycardia: Secondary | ICD-10-CM

## 2021-05-25 MED ORDER — SOTALOL HCL 80 MG PO TABS: 80.0000 mg | ORAL_TABLET | Freq: Two times a day (BID) | ORAL | 3 refills | Status: DC

## 2021-06-03 ENCOUNTER — Ambulatory Visit: Payer: Medicare Other | Admitting: Internal Medicine

## 2021-06-05 ENCOUNTER — Encounter: Payer: Self-pay | Admitting: Family Medicine

## 2021-06-06 NOTE — Telephone Encounter (Signed)
Immunization record updated   Ok to placed referral?

## 2021-06-17 NOTE — Telephone Encounter (Signed)
See note

## 2021-06-20 ENCOUNTER — Other Ambulatory Visit: Payer: Self-pay | Admitting: *Deleted

## 2021-06-20 DIAGNOSIS — I471 Supraventricular tachycardia: Secondary | ICD-10-CM

## 2021-06-20 NOTE — Telephone Encounter (Signed)
Referral placed.

## 2021-06-27 ENCOUNTER — Encounter (HOSPITAL_BASED_OUTPATIENT_CLINIC_OR_DEPARTMENT_OTHER): Payer: Self-pay | Admitting: Cardiology

## 2021-06-27 ENCOUNTER — Ambulatory Visit (INDEPENDENT_AMBULATORY_CARE_PROVIDER_SITE_OTHER): Payer: Medicare Other | Admitting: Cardiology

## 2021-06-27 ENCOUNTER — Other Ambulatory Visit: Payer: Self-pay

## 2021-06-27 VITALS — BP 140/72 | HR 64 | Ht 65.0 in | Wt 126.0 lb

## 2021-06-27 DIAGNOSIS — I6529 Occlusion and stenosis of unspecified carotid artery: Secondary | ICD-10-CM | POA: Diagnosis not present

## 2021-06-27 DIAGNOSIS — Z7189 Other specified counseling: Secondary | ICD-10-CM

## 2021-06-27 DIAGNOSIS — R011 Cardiac murmur, unspecified: Secondary | ICD-10-CM | POA: Diagnosis not present

## 2021-06-27 DIAGNOSIS — R0989 Other specified symptoms and signs involving the circulatory and respiratory systems: Secondary | ICD-10-CM | POA: Diagnosis not present

## 2021-06-27 DIAGNOSIS — I361 Nonrheumatic tricuspid (valve) insufficiency: Secondary | ICD-10-CM

## 2021-06-27 DIAGNOSIS — I471 Supraventricular tachycardia: Secondary | ICD-10-CM

## 2021-06-27 NOTE — Patient Instructions (Signed)

## 2021-06-27 NOTE — Progress Notes (Signed)
Cardiology Office Note:    Date:  06/27/2021   ID:  Linda [name], DOB [birth year], MRN [id number]  PCP:  Vivi Barrack, MD  Cardiologist:  Buford Dresser, MD  Referring MD: Vivi Barrack, MD   CC: new patient evaluation for history of atrial tachycardia   History of Present Illness:    Linda [name] is a 87 y.o. female with a hx of hypertension, hyperlipidemia, COPD, and arthritis, who is seen as a new consult at the request of Vivi Barrack, MD for the evaluation and management of paroxysmal atrial tachycardia.  I reviewed Dr. Marigene Ehlers notes. She was previously under the care of Dr. Alessandra Bevels, [facility], [facility], with cardiology (placed on sotalol in 2019).   Her prior PCP was Dr. Roselee Culver, [place].   None of these records are available in [place].  Cardiovascular risk factors: Prior clinical ASCVD: None Comorbid conditions: hypertension, hyperlipidemia,  Prior cardiac testing and/or incidental findings on other testing (ie coronary calcium): No invasive testing, Monitor at one time, Echo 09/2019 (LVEF 60-65%, age-related delayed relaxation)  She is accompanied by a family member, who also provides some history. Overall, she is feeling okay aside from a persistent, bothersome cough. Sometimes she will produce sputum. This has been a chronic issue, and she follows with pulmonology.  While living in [place], she was started on sotalol, which she has been taking for at least 3 years. Prior to this, she was experiencing palpitations that she attributed to anxiety before going on a cruise.   She has mild LE edema, and notes having a previously torn muscle in her left knee.  At this time she is tolerating her medication regimen.  She denies any chest pain, or shortness of breath. No lightheadedness, headaches, syncope, orthopnea, PND, or exertional symptoms.  Past Medical History:  Diagnosis Date   Arthritis     COPD (chronic obstructive pulmonary disease) (HCC)    chronic cough   Glaucoma    Hyperlipidemia    Hypertension    Irregular heartbeat    Osteoporosis    Thyroid disease    enlarged thyroid    Past Surgical History:  Procedure Laterality Date   APPENDECTOMY     cateract surgery     COLONOSCOPY     Done in [place]  yrs ago   UPPER GASTROINTESTINAL ENDOSCOPY  2019   WRIST SURGERY      Current Medications: Current Outpatient Medications on File Prior to Visit  Medication Sig   Cholecalciferol (VITAMIN D3) 25 MCG (1000 UT) CAPS Take 1 capsule by mouth daily.   clobetasol cream (TEMOVATE) 9.79 % APPLY 1 APPLICATION TOPICALLY AS NEEDED   famotidine (PEPCID) 20 MG tablet Take 1 tablet (20 mg total) by mouth 2 (two) times daily.   FLUAD 0.5 ML SUSY    fluticasone (FLONASE) 50 MCG/ACT nasal spray USE 1 SPRAY IN EACH NOSTRIL DAILY AS NEEDED FOR ALLERGIES OR RHINITIS   rosuvastatin (CRESTOR) 20 MG tablet Take 1 tablet (20 mg total) by mouth daily.   sotalol (BETAPACE) 80 MG tablet Take 1 tablet (80 mg total) by mouth every 12 (twelve) hours.   timolol (TIMOPTIC) 0.25 % ophthalmic solution    No current facility-administered medications on file prior to visit.     Allergies:   Patient has no known allergies.   Social History   Tobacco Use   Smoking status: Former    Packs/day: 0.25    Years: 2.00  Pack years: 0.50    Types: Cigarettes    Start date: 40    Quit date: 45    Years since quitting: 70.9   Smokeless tobacco: Never  Vaping Use   Vaping Use: Never used  Substance Use Topics   Alcohol use: Not Currently   Drug use: Never    Family History: family history includes Heart disease in her brother, brother, father, and mother. There is no history of Colon cancer, Colon polyps, or Esophageal cancer.  ROS:   Please see the history of present illness.  Additional pertinent ROS: Constitutional: Negative for chills, fevers, night sweats, unintentional weight loss.   HENT: Negative for ear pain and hearing loss.   Eyes: Negative for loss of vision and eye pain.  Respiratory: Positive for cough, sputum. Negative for wheezing.   Cardiovascular: See HPI. Gastrointestinal: Negative for abdominal pain, melena, and hematochezia.  Genitourinary: Negative for dysuria and hematuria.  Musculoskeletal: Negative for falls and myalgias.  Skin: Negative for itching and rash.  Neurological: Negative for focal weakness, focal sensory changes and loss of consciousness.  Endo/Heme/Allergies: Does not bruise/bleed easily.     EKGs/Labs/Other Studies Reviewed:    The following studies were reviewed today:  Carotid Arterial Duplex 10/04/2020: Summary:  Right Carotid: Velocities in the right ICA are consistent with a 1-39%  stenosis.   Left Carotid: Velocities in the left ICA are consistent with a 1-39%  stenosis.   Vertebrals: Bilateral vertebral arteries demonstrate antegrade flow.   Echo 09/30/2019:  1. Left ventricular ejection fraction, by estimation, is 60 to 65%. The  left ventricle has normal function. The left ventricle has no regional  wall motion abnormalities. Left ventricular diastolic parameters are  consistent with age-related delayed  relaxation (normal).   2. Right ventricular systolic function is normal. The right ventricular  size is normal. There is moderately elevated pulmonary artery systolic  pressure. The estimated right ventricular systolic pressure is 62.1 mmHg.   3. Left atrial size was mildly dilated.   4. Right atrial size was mildly dilated.   5. The mitral valve is degenerative. Mild mitral valve regurgitation.   6. The tricuspid valve is myxomatous. Tricuspid valve regurgitation is  moderate to severe.   7. The aortic valve is tricuspid. Aortic valve regurgitation is mild.  Mild aortic valve sclerosis is present, with no evidence of aortic valve  stenosis.   8. The inferior vena cava is normal in size with <50% respiratory   variability, suggesting right atrial pressure of 8 mmHg.   EKG:  EKG is personally reviewed.   06/27/2021: SR, 1st degree AV block, LVH at 64 bpm  Recent Labs: 04/13/2021: ALT 12; BUN 14; Creatinine, Ser 0.69; Hemoglobin 13.1; Platelets 217.0; Potassium 4.2; Sodium 135; TSH 0.87   Recent Lipid Panel    Component Value Date/Time   CHOL 158 06/23/2020 1027   TRIG 139 06/23/2020 1027   HDL 57 06/23/2020 1027   CHOLHDL 2.8 06/23/2020 1027   VLDL 27.0 03/14/2019 1144   LDLCALC 77 06/23/2020 1027    Physical Exam:    VS:  BP 140/72   Pulse 64   Ht _0  (1.651 m)   Wt 126 lb (57.2 kg)   SpO2 91%   BMI 20.97 kg/m     Wt Readings from Last 3 Encounters:  06/27/21 126 lb (57.2 kg)  04/13/21 125 lb 6.4 oz (56.9 kg)  03/22/21 126 lb 9.6 oz (57.4 kg)    GEN: Well nourished, well developed  in no acute distress HEENT: Normal, moist mucous membranes NECK: No JVD CARDIAC: regular rhythm, normal S1 and S2, no rubs or gallops. 2/6 systolic murmur at sternal border VASCULAR: Radial and DP pulses 2+ bilaterally. No carotid bruits RESPIRATORY:  Clear to auscultation without rales, wheezing or rhonchi. Frequent coughing. ABDOMEN: Soft, non-tender, non-distended MUSCULOSKELETAL:  Ambulates independently SKIN: Warm and dry, no edema NEUROLOGIC:  Alert and oriented x 3. No focal neuro deficits noted. PSYCHIATRIC:  Normal affect    ASSESSMENT:    1. Paroxysmal atrial tachycardia ([place])   2. Murmur, cardiac   3. Nonrheumatic tricuspid valve regurgitation   4. Suspected pulmonary hypertension   5. Cardiac risk counseling    PLAN:    Paroxysmal atrial tachycardia: -by report. I cannot see prior records. We did discuss that atrial fibrillation/atrial flutter are treated with anticoagulation as well. They do not recall ever hearing these mentioned. She is doing well on sotalol and dose not wish to change at this time. Continue.  Murmur Tricuspid regurgitation Elevated pulmonary  pressures -appears euvolemic on exam -soft murmur -discussed potential etiologies of elevated PA pressures. 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 Will continue while tolerating to avoid rebound CV events with withdrawal  Plan for follow up: 1 year or sooner as needed.  Buford Dresser, MD, PhD, [facility]    Medication Adjustments/Labs and Tests Ordered: Current medicines are reviewed at length with the patient today.  Concerns regarding medicines are outlined above.   Orders Placed This Encounter  Procedures   EKG 12-Lead    No orders of the defined types were placed in this encounter.  Patient Instructions  Medication Instructions:  Your Physician recommend you continue on your current medication as directed.    *If you need a refill on your cardiac medications before your next appointment, please call your pharmacy*   Lab Work: None ordered today   Testing/Procedures: None ordered today   Follow-Up: At [facility], you and your health needs are our priority.  As part of our continuing mission to provide you with exceptional heart care, we have created designated Provider Care Teams.  These Care Teams include your primary Cardiologist (physician) and Advanced Practice Providers (APPs -  Physician Assistants and Nurse Practitioners) who all work together to provide you with the care you need, when you need it.  We recommend signing up for the patient portal called "MyChart".  Sign up information is provided on this After Visit Summary.  MyChart is used to connect with patients for Virtual Visits (Telemedicine).  Patients are able to view lab/test results,  encounter notes, upcoming appointments, etc.  Non-urgent messages can be sent to your provider as well.   To learn more about what you can do with MyChart, go to [email or web address].    Your next appointment:   1 year(s)  The format for your next appointment:   In Person  Provider:   Buford Dresser, MD       [facility],acting as a scribe for Buford Dresser, MD.,have documented all relevant documentation on the behalf of Buford Dresser, MD,as directed by  Buford Dresser, MD while in the presence of Buford Dresser, MD.  I, Buford Dresser, MD, have reviewed all documentation for this visit.  The documentation on 06/27/21 for the exam, diagnosis, procedures, and orders are all accurate and complete.   Signed, Buford Dresser, MD PhD 06/27/2021 1:11 PM    [facility]

## 2021-07-26 ENCOUNTER — Other Ambulatory Visit: Payer: Self-pay

## 2021-07-26 ENCOUNTER — Encounter: Payer: Self-pay | Admitting: Internal Medicine

## 2021-07-26 ENCOUNTER — Ambulatory Visit (INDEPENDENT_AMBULATORY_CARE_PROVIDER_SITE_OTHER): Payer: Medicare Other | Admitting: Internal Medicine

## 2021-07-26 VITALS — BP 132/74 | HR 67 | Ht 65.0 in | Wt 126.0 lb

## 2021-07-26 DIAGNOSIS — A310 Pulmonary mycobacterial infection: Secondary | ICD-10-CM

## 2021-07-26 DIAGNOSIS — J479 Bronchiectasis, uncomplicated: Secondary | ICD-10-CM

## 2021-07-26 NOTE — Patient Instructions (Addendum)
Please schedule follow up scheduled with myself in 6 months.  If my schedule is not open yet, we will contact you with a reminder closer to that time. Please call [phone] if you haven't heard from us a month before.  ° °

## 2021-07-26 NOTE — Progress Notes (Signed)
Linda [name]    [id number]    [birth year]  Primary Care Physician:Parker, Algis Greenhouse, MD Date of Appointment: 07/26/2021 Established Patient Visit  Chief complaint:   Chief Complaint  Patient presents with   Follow-up    No new concerns     HPI: Linda [name] is a 87 y.o. woman with history of bronchiectasis and MAI.   Interval Updates: Here for follow up. No issues with weight loss or appetite. Still  having cough. Breathing and sleeping going ok.  Doing fine off antibiotics, has occasional sputum production.   No fevers, chills night sweats. No interval bronchitis/pneumonia.   She does get short of breath sometimes with exertion.   I have reviewed the patient's family social and past medical history and updated as appropriate.   Past Medical History:  Diagnosis Date   Arthritis    COPD (chronic obstructive pulmonary disease) (HCC)    chronic cough   Glaucoma    Hyperlipidemia    Hypertension    Irregular heartbeat    Osteoporosis    Thyroid disease    enlarged thyroid    Past Surgical History:  Procedure Laterality Date   APPENDECTOMY     cateract surgery     COLONOSCOPY     Done in [place]  yrs ago   UPPER GASTROINTESTINAL ENDOSCOPY  2019   WRIST SURGERY      Family History  Problem Relation Age of Onset   Heart disease Mother    Heart disease Father    Heart disease Brother    Heart disease Brother    Colon cancer Neg Hx    Colon polyps Neg Hx    Esophageal cancer Neg Hx     Social History   Occupational History   Occupation: Retired  Tobacco Use   Smoking status: Former    Packs/day: 0.25    Years: 2.00    Pack years: 0.50    Types: Cigarettes    Start date: 1950    Quit date: 1952    Years since quitting: 70.9   Smokeless tobacco: Never  Vaping Use   Vaping Use: Never used  Substance and Sexual Activity   Alcohol use: Not Currently   Drug use: Never   Sexual activity: Not on file     Physical Exam: Blood pressure  132/74, pulse 67, height _0  (1.651 m), weight 126 lb (57.2 kg), SpO2 95 %.  Gen:      No acute distress, thin, no distress Lungs:    kyphosis, ctab no wheezes or crackles CV:         RRR no mrg   Data Reviewed: Imaging: I have personally reviewed the CT Chest October 2021 which shows bronchiectasis with nodule densities consistent with MAI infection.  PFTs:   PFT Results Latest Ref Rng & Units 07/14/2019  FVC-Pre L 1.83  FVC-Predicted Pre % 91  FVC-Post L 1.75  FVC-Predicted Post % 87  Pre FEV1/FVC % % 70  Post FEV1/FCV % % 78  FEV1-Pre L 1.29  FEV1-Predicted Pre % 88  FEV1-Post L 1.36  DLCO uncorrected ml/min/mmHg 12.92  TLC L 4.36  TLC % Predicted % 86  RV % Predicted % 99   I have personally reviewed the patient's PFTs and there is mild airflow limitation without BD response. Lung volumes normal, diffusion capacity is moderately reduced.   Labs:  Immunization status: Immunization History  Administered Date(s) Administered   Influenza, High Dose Seasonal  PF 05/15/2019   Influenza-Unspecified 06/04/2018, 05/07/2020, 05/27/2021   PFIZER(Purple Top)SARS-COV-2 Vaccination 08/28/2019, 09/17/2019, 06/02/2020, 02/03/2021   PNEUMOCOCCAL CONJUGATE-20 10/19/2020   Zoster Recombinat (Shingrix) 04/02/2021    Assessment:  Chronic cough Bronchiectasis without exacerbation Concern for Mycobacterium Avium Complex infection  Plan/Recommendations: Continues to have cough with dyspnea with exertion. Otherwise feels she has an acceptable quality of l ife.  WE discussed a trial of albuterol prn or nebulized saline treatments with flutter valve. She would like to hold off all therapies for now. Continue to monitor MAI conservatively off therapy.  She will let us know if symptoms change or progress.   Return to Care: Return in about 6 months (around 01/24/2022).   Lenice Llamas, MD Pulmonary and [place]

## 2021-08-11 ENCOUNTER — Ambulatory Visit: Payer: Medicare Other | Admitting: Family Medicine

## 2021-08-12 ENCOUNTER — Ambulatory Visit (INDEPENDENT_AMBULATORY_CARE_PROVIDER_SITE_OTHER): Payer: Medicare Other

## 2021-08-12 ENCOUNTER — Ambulatory Visit (INDEPENDENT_AMBULATORY_CARE_PROVIDER_SITE_OTHER): Payer: Medicare Other | Admitting: Family Medicine

## 2021-08-12 ENCOUNTER — Other Ambulatory Visit: Payer: Self-pay

## 2021-08-12 VITALS — BP 175/51 | HR 69 | Temp 98.2°F | Ht 65.0 in | Wt 126.2 lb

## 2021-08-12 DIAGNOSIS — I10 Essential (primary) hypertension: Secondary | ICD-10-CM | POA: Diagnosis not present

## 2021-08-12 DIAGNOSIS — J479 Bronchiectasis, uncomplicated: Secondary | ICD-10-CM

## 2021-08-12 DIAGNOSIS — E559 Vitamin D deficiency, unspecified: Secondary | ICD-10-CM | POA: Diagnosis not present

## 2021-08-12 DIAGNOSIS — I471 Supraventricular tachycardia: Secondary | ICD-10-CM | POA: Diagnosis not present

## 2021-08-12 DIAGNOSIS — M1990 Unspecified osteoarthritis, unspecified site: Secondary | ICD-10-CM | POA: Insufficient documentation

## 2021-08-12 DIAGNOSIS — Z0000 Encounter for general adult medical examination without abnormal findings: Secondary | ICD-10-CM

## 2021-08-12 NOTE — Patient Instructions (Signed)
[name] , Thank you for taking time to come for your Medicare Wellness Visit. I appreciate your ongoing commitment to your health goals. Please review the following plan we discussed and let me know if I can assist you in the future.   Screening recommendations/referrals: Colonoscopy: No longer required  Mammogram: No longer required  Bone Density: Done 12/22/20  Recommended yearly ophthalmology/optometry visit for glaucoma screening and checkup Recommended yearly dental visit for hygiene and checkup  Vaccinations: Influenza vaccine: done 05/27/21  Pneumococcal vaccine: Up to date Tdap vaccine: never done  Shingles vaccine: 1st dose 04/02/21    Covid-19:Completed 1//14, 2/3, 06/02/20 & 02/03/21  Advanced directives: copies in chart   Conditions/risks identified: walk more and drink water   Next appointment: Follow up in one year for your annual wellness visit    Preventive Care 65 Years and Older, Female Preventive care refers to lifestyle choices and visits with your health care provider that can promote health and wellness. What does preventive care include? A yearly physical exam. This is also called an annual well check. Dental exams once or twice a year. Routine eye exams. Ask your health care provider how often you should have your eyes checked. Personal lifestyle choices, including: Daily care of your teeth and gums. Regular physical activity. Eating a healthy diet. Avoiding tobacco and drug use. Limiting alcohol use. Practicing safe sex. Taking low-dose aspirin every day. Taking vitamin and mineral supplements as recommended by your health care provider. What happens during an annual well check? The services and screenings done by your health care provider during your annual well check will depend on your age, overall health, lifestyle risk factors, and family history of disease. Counseling  Your health care provider may ask you questions about your: Alcohol  use. Tobacco use. Drug use. Emotional well-being. Home and relationship well-being. Sexual activity. Eating habits. History of falls. Memory and ability to understand (cognition). Work and work Astronomer. Reproductive health. Screening  You may have the following tests or measurements: Height, weight, and BMI. Blood pressure. Lipid and cholesterol levels. These may be checked every 5 years, or more frequently if you are over 85 years old. Skin check. Lung cancer screening. You may have this screening every year starting at age 85 if you have a 30-pack-year history of smoking and currently smoke or have quit within the past 15 years. Fecal occult blood test (FOBT) of the stool. You may have this test every year starting at age 85. Flexible sigmoidoscopy or colonoscopy. You may have a sigmoidoscopy every 5 years or a colonoscopy every 10 years starting at age 85. Hepatitis C blood test. Hepatitis B blood test. Sexually transmitted disease (STD) testing. Diabetes screening. This is done by checking your blood sugar (glucose) after you have not eaten for a while (fasting). You may have this done every 1-3 years. Bone density scan. This is done to screen for osteoporosis. You may have this done starting at age 85. Mammogram. This may be done every 1-2 years. Talk to your health care provider about how often you should have regular mammograms. Talk with your health care provider about your test results, treatment options, and if necessary, the need for more tests. Vaccines  Your health care provider may recommend certain vaccines, such as: Influenza vaccine. This is recommended every year. Tetanus, diphtheria, and acellular pertussis (Tdap, Td) vaccine. You may need a Td booster every 10 years. Zoster vaccine. You may need this after age 85. Pneumococcal 13-valent conjugate (PCV13) vaccine.  One dose is recommended after age 85. Pneumococcal polysaccharide (PPSV23) vaccine. One dose is  recommended after age 85. Talk to your health care provider about which screenings and vaccines you need and how often you need them. This information is not intended to replace advice given to you by your health care provider. Make sure you discuss any questions you have with your health care provider. Document Released: 08/27/2015 Document Revised: 04/19/2016 Document Reviewed: 06/01/2015 Elsevier Interactive Patient Education  2017 [employer] Prevention in the Home Falls can cause injuries. They can happen to people of all ages. There are many things you can do to make your home safe and to help prevent falls. What can I do on the outside of my home? Regularly fix the edges of walkways and driveways and fix any cracks. Remove anything that might make you trip as you walk through a door, such as a raised step or threshold. Trim any bushes or trees on the path to your home. Use bright outdoor lighting. Clear any walking paths of anything that might make someone trip, such as rocks or tools. Regularly check to see if handrails are loose or broken. Make sure that both sides of any steps have handrails. Any raised decks and porches should have guardrails on the edges. Have any leaves, snow, or ice cleared regularly. Use sand or salt on walking paths during winter. Clean up any spills in your garage right away. This includes oil or grease spills. What can I do in the bathroom? Use night lights. Install grab bars by the toilet and in the tub and shower. Do not use towel bars as grab bars. Use non-skid mats or decals in the tub or shower. If you need to sit down in the shower, use a plastic, non-slip stool. Keep the floor dry. Clean up any water that spills on the floor as soon as it happens. Remove soap buildup in the tub or shower regularly. Attach bath mats securely with double-sided non-slip rug tape. Do not have throw rugs and other things on the floor that can make you  trip. What can I do in the bedroom? Use night lights. Make sure that you have a light by your bed that is easy to reach. Do not use any sheets or blankets that are too big for your bed. They should not hang down onto the floor. Have a firm chair that has side arms. You can use this for support while you get dressed. Do not have throw rugs and other things on the floor that can make you trip. What can I do in the kitchen? Clean up any spills right away. Avoid walking on wet floors. Keep items that you use a lot in easy-to-reach places. If you need to reach something above you, use a strong step stool that has a grab bar. Keep electrical cords out of the way. Do not use floor polish or wax that makes floors slippery. If you must use wax, use non-skid floor wax. Do not have throw rugs and other things on the floor that can make you trip. What can I do with my stairs? Do not leave any items on the stairs. Make sure that there are handrails on both sides of the stairs and use them. Fix handrails that are broken or loose. Make sure that handrails are as long as the stairways. Check any carpeting to make sure that it is firmly attached to the stairs. Fix any carpet that is loose or  worn. Avoid having throw rugs at the top or bottom of the stairs. If you do have throw rugs, attach them to the floor with carpet tape. Make sure that you have a light switch at the top of the stairs and the bottom of the stairs. If you do not have them, ask someone to add them for you. What else can I do to help prevent falls? Wear shoes that: Do not have high heels. Have rubber bottoms. Are comfortable and fit you well. Are closed at the toe. Do not wear sandals. If you use a stepladder: Make sure that it is fully opened. Do not climb a closed stepladder. Make sure that both sides of the stepladder are locked into place. Ask someone to hold it for you, if possible. Clearly mark and make sure that you can  see: Any grab bars or handrails. First and last steps. Where the edge of each step is. Use tools that help you move around (mobility aids) if they are needed. These include: Canes. Walkers. Scooters. Crutches. Turn on the lights when you go into a dark area. Replace any light bulbs as soon as they burn out. Set up your furniture so you have a clear path. Avoid moving your furniture around. If any of your floors are uneven, fix them. If there are any pets around you, be aware of where they are. Review your medicines with your doctor. Some medicines can make you feel dizzy. This can increase your chance of falling. Ask your doctor what other things that you can do to help prevent falls. This information is not intended to replace advice given to you by your health care provider. Make sure you discuss any questions you have with your health care provider. Document Released: 05/27/2009 Document Revised: 01/06/2016 Document Reviewed: 09/04/2014 Elsevier Interactive Patient Education  2017 [employer].

## 2021-08-12 NOTE — Assessment & Plan Note (Signed)
Home readings are at goal in the 130s to 140s over 70s to 80s.  She has isolated systolic hypertension today with reading of 175/51.  She has occasional dizziness.  We discussed avoidance of additional antihypertensives at this point due to concern for her low diastolic.  It is reassuring that her home readings have been at goal.  They will continue monitoring at home.

## 2021-08-12 NOTE — Patient Instructions (Signed)
It was very nice to see you today!  No changes today.  Use ice on your knees.  You can also try compression sleeves.  Please come back in 4 to 6 months.  Come back to see me sooner if needed.  Take care, Dr Jimmey Ralph  PLEASE NOTE:  If you had any lab tests please let us know if you have not heard back within a few days. You may see your results on mychart before we have a chance to review them but we will give you a call once they are reviewed by [place]. If we ordered any referrals today, please let us know if you have not heard from their office within the next week.   Please try these tips to maintain a healthy lifestyle:  Eat at least 3 REAL meals and 1-2 snacks per day.  Aim for no more than 5 hours between eating.  If you eat breakfast, please do so within one hour of getting up.   Each meal should contain half fruits/vegetables, one quarter protein, and one quarter carbs (no bigger than a computer mouse)  Cut down on sweet beverages. This includes juice, soda, and sweet tea.   Drink at least 1 glass of water with each meal and aim for at least 8 glasses per day  Exercise at least 150 minutes every week.

## 2021-08-12 NOTE — Assessment & Plan Note (Signed)
Stable.  Continue management of per pulmonology.

## 2021-08-12 NOTE — Assessment & Plan Note (Signed)
On vit D 2000IU daily.  Recheck in 3 to 6 months.

## 2021-08-12 NOTE — Progress Notes (Signed)
° °  Evoleth Golonka is a 87 y.o. female who presents today for an office visit.  Assessment/Plan:  Chronic Problems Addressed Today: Osteoarthritis No red flags.  Uses Tylenol.  Recommended topical Voltaren.  She can also use ice and compression.  Bronchiectasis without acute exacerbation (HCC) Stable.  Continue management of per pulmonology.  Paroxysmal atrial tachycardia [facility]) Recently saw cardiology.  Symptoms are stable on sotalol 80 mg twice daily.  Essential hypertension Home readings are at goal in the 130s to 140s over 70s to 80s.  She has isolated systolic hypertension today with reading of 175/51.  She has occasional dizziness.  We discussed avoidance of additional antihypertensives at this point due to concern for her low diastolic.  It is reassuring that her home readings have been at goal.  They will continue monitoring at home.  Vitamin D deficiency On vit D 2000IU daily.  Recheck in 3 to 6 months.     Subjective:  HPI:  See A/P for status of chronic conditions.  She still has issues with ongoing cough.  She recently saw pulmonology.  She also has some aches and pains in her knees.  Uses Tylenol which helps.       Objective:  Physical Exam: BP (!) 175/51    Pulse 69    Temp 98.2 F (36.8 C)    Ht 5\' 5"  (1.651 m)    Wt 126 lb 4 oz (57.3 kg)    SpO2 96%    BMI 21.01 kg/m   Gen: No acute distress, resting comfortably CV: Regular rate and rhythm with no murmurs appreciated Pulm: Normal work of breathing, clear to auscultation bilaterally  Neuro: Grossly normal, moves all extremities Psych: Normal affect and thought content      Megon Kalina M. , MD 08/12/2021 2:30 PM

## 2021-08-12 NOTE — Assessment & Plan Note (Signed)
Recently saw cardiology.  Symptoms are stable on sotalol 80 mg twice daily.

## 2021-08-12 NOTE — Assessment & Plan Note (Signed)
No red flags.  Uses Tylenol.  Recommended topical Voltaren.  She can also use ice and compression.

## 2021-08-12 NOTE — Progress Notes (Signed)
Subjective:   Linda [name] is a 87 y.o. female who presents for Medicare Annual (Subsequent) preventive examination.  Review of Systems     Cardiac Risk Factors include: advanced age (>31men, >40 women);dyslipidemia;hypertension     Objective:    Today's Vitals   08/12/21 1432  PainSc: 10-Worst pain ever   There is no height or weight on file to calculate BMI.  Advanced Directives 08/12/2021 12/25/2019  Does Patient Have a Medical Advance Directive? Yes Yes  Type of Advance Directive Healthcare Power of Attorney Living will;Healthcare Power of Attorney  Does patient want to make changes to medical advance directive? - No - Patient declined  Copy of Healthcare Power of Attorney in Chart? Yes - validated most recent copy scanned in chart (See row information) No - copy requested    Current Medications (verified) Outpatient Encounter Medications as of 08/12/2021  Medication Sig   Cholecalciferol (VITAMIN D3) 25 MCG (1000 UT) CAPS Take 1 capsule by mouth daily.   clobetasol cream (TEMOVATE) 0.05 % APPLY 1 APPLICATION TOPICALLY AS NEEDED   famotidine (PEPCID) 20 MG tablet Take 1 tablet (20 mg total) by mouth 2 (two) times daily.   fluticasone (FLONASE) 50 MCG/ACT nasal spray USE 1 SPRAY IN EACH NOSTRIL DAILY AS NEEDED FOR ALLERGIES OR RHINITIS   rosuvastatin (CRESTOR) 20 MG tablet Take 1 tablet (20 mg total) by mouth daily.   sotalol (BETAPACE) 80 MG tablet Take 1 tablet (80 mg total) by mouth every 12 (twelve) hours.   timolol (TIMOPTIC) 0.25 % ophthalmic solution    FLUAD 0.5 ML SUSY    No facility-administered encounter medications on file as of 08/12/2021.    Allergies (verified) Patient has no known allergies.   History: Past Medical History:  Diagnosis Date   Arthritis    COPD (chronic obstructive pulmonary disease) (HCC)    chronic cough   Glaucoma    Hyperlipidemia    Hypertension    Irregular heartbeat    Osteoporosis    Thyroid disease    enlarged thyroid    Past Surgical History:  Procedure Laterality Date   APPENDECTOMY     cateract surgery     COLONOSCOPY     Done in [place]  yrs ago   UPPER GASTROINTESTINAL ENDOSCOPY  2019   WRIST SURGERY     Family History  Problem Relation Age of Onset   Heart disease Mother    Heart disease Father    Heart disease Brother    Heart disease Brother    Colon cancer Neg Hx    Colon polyps Neg Hx    Esophageal cancer Neg Hx    Social History   Socioeconomic History   Marital status: Widowed    Spouse name: Not on file   Number of children: Not on file   Years of education: Not on file   Highest education level: Not on file  Occupational History   Occupation: Retired  Tobacco Use   Smoking status: Former    Packs/day: 0.25    Years: 2.00    Pack years: 0.50    Types: Cigarettes    Start date: 1950    Quit date: 1952    Years since quitting: 71.0   Smokeless tobacco: Never  Vaping Use   Vaping Use: Never used  Substance and Sexual Activity   Alcohol use: Not Currently   Drug use: Never   Sexual activity: Not on file  Other Topics Concern   Not on file  Social  History Narrative   Resident at [employer]    Enjoys playing cards    Social Determinants of Health   Financial Resource Strain: Low Risk    Difficulty of Paying Living Expenses: Not hard at all  Food Insecurity: No Food Insecurity   Worried About Programme researcher, broadcasting/film/video in the Last Year: Never true   Barista in the Last Year: Never true  Transportation Needs: No Transportation Needs   Lack of Transportation (Medical): No   Lack of Transportation (Non-Medical): No  Physical Activity: Inactive   Days of Exercise per Week: 0 days   Minutes of Exercise per Session: 0 min  Stress: No Stress Concern Present   Feeling of Stress : Only a little  Social Connections: Socially Isolated   Frequency of Communication with Friends and Family: Once a week   Frequency of Social Gatherings with Friends and Family: More  than three times a week   Attends Religious Services: Never   Database administrator or Organizations: No   Attends Banker Meetings: Never   Marital Status: Widowed    Tobacco Counseling Counseling given: Not Answered   Clinical Intake:  Pre-visit preparation completed: Yes  Pain : 0-10 Pain Score: 10-Worst pain ever Pain Type: Chronic pain Pain Location: Back (knee) Pain Descriptors / Indicators: Aching, Sore, Numbness Pain Onset: 1 to 4 weeks ago Pain Frequency: Intermittent     BMI - recorded: 21.01 Nutritional Status: BMI of 19-24  Normal Nutritional Risks: None Diabetes: No  How often do you need to have someone help you when you read instructions, pamphlets, or other written materials from your doctor or pharmacy?: 1 - Never  Diabetic?no  Interpreter Needed?: No  Information entered by :: Lanier Ensign, LPN   Activities of Daily Living In your present state of health, do you have any difficulty performing the following activities: 08/12/2021  Hearing? Y  Comment wears hearing aids  Vision? N  Difficulty concentrating or making decisions? Y  Walking or climbing stairs? N  Dressing or bathing? N  Doing errands, shopping? N  Preparing Food and eating ? N  Using the Toilet? N  In the past six months, have you accidently leaked urine? N  Do you have problems with loss of bowel control? N  Managing your Medications? N  Managing your Finances? N  Housekeeping or managing your Housekeeping? N  Some recent data might be hidden    Patient Care Team: Ardith Dark, MD as PCP - General (Family Medicine) Jodelle Red, MD as PCP - Cardiology (Cardiology) Leslye Peer, MD as Consulting Physician (Pulmonary Disease) Tyrell Antonio, MD as Consulting Physician (Physical Medicine and Rehabilitation) Lavada Mesi, MD (Inactive) as Consulting Physician (Family Medicine)  Indicate any recent Medical Services you may have received from  other than [facility] providers in the past year (date may be approximate).     Assessment:   This is a routine wellness examination for [place].  Hearing/Vision screen Hearing Screening - Comments:: Pt wears hearing aids  Vision Screening - Comments:: Pt follows up wit Dr Randon Goldsmith for eye exams   Dietary issues and exercise activities discussed: Current Exercise Habits: The patient does not participate in regular exercise at present   Goals Addressed             This Visit's Progress    Patient Stated       Walk more        Depression Screen [facility] 2/9  Scores 08/12/2021 08/12/2021 04/13/2021 11/18/2020 06/23/2020 12/25/2019 12/25/2019  PHQ - 2 Score 0 0 5 0 0 0 0  PHQ- 9 Score - - 7 - - - -    Fall Risk Fall Risk  08/12/2021 08/12/2021 11/18/2020 09/29/2020 06/23/2020  Falls in the past year? 0 0 0 0 0  Number falls in past yr: 0 0 - - -  Injury with Fall? 0 0 - - -  Risk for fall due to : Impaired vision;Impaired balance/gait No Fall Risks - - No Fall Risks  Follow up Falls prevention discussed Falls evaluation completed - - -    FALL RISK PREVENTION PERTAINING TO THE HOME:  Any stairs in or around the home? No  If so, are there any without handrails? No  Home free of loose throw rugs in walkways, pet beds, electrical cords, etc? Yes  Adequate lighting in your home to reduce risk of falls? Yes   ASSISTIVE DEVICES UTILIZED TO PREVENT FALLS:  Life alert? Yes  pull cord and personal alert  Use of a cane, walker or w/c? Yes  Grab bars in the bathroom? Yes  Shower chair or bench in shower? Yes  Elevated toilet seat or a handicapped toilet? Yes   TIMED UP AND GO:  Was the test performed? Yes .  Length of time to ambulate 10 feet: 20 sec.   Gait slow and steady with assistive device  Cognitive Function: declines at this time     6CIT Screen 12/25/2019  What Year? 0 points  What month? 0 points  What time? 0 points  Count back from 20 0 points  Months in reverse 0 points   Repeat phrase 2 points  Total Score 2    Immunizations Immunization History  Administered Date(s) Administered   Influenza, High Dose Seasonal PF 05/15/2019   Influenza-Unspecified 06/04/2018, 05/07/2020, 05/27/2021   PFIZER(Purple Top)SARS-COV-2 Vaccination 08/28/2019, 09/17/2019, 06/02/2020, 02/03/2021   PNEUMOCOCCAL CONJUGATE-20 10/19/2020   Zoster Recombinat (Shingrix) 04/02/2021    TDAP status: Due, Education has been provided regarding the importance of this vaccine. Advised may receive this vaccine at local pharmacy or Health Dept. Aware to provide a copy of the vaccination record if obtained from local pharmacy or Health Dept. Verbalized acceptance and understanding.  Flu Vaccine status: Up to date  Pneumococcal vaccine status: Up to date  Covid-19 vaccine status: Completed vaccines  Qualifies for Shingles Vaccine? Yes   Zostavax completed Yes   Shingrix Completed?: Yes  Screening Tests Health Maintenance  Topic Date Due   TETANUS/TDAP  Never done   COVID-19 Vaccine (5 - Booster for Pfizer series) 03/31/2021   Zoster Vaccines- Shingrix (2 of 2) 05/28/2021   Pneumonia Vaccine 58+ Years old  Completed   INFLUENZA VACCINE  Completed   HPV VACCINES  Aged Out   DEXA SCAN  Discontinued    Health Maintenance  Health Maintenance Due  Topic Date Due   TETANUS/TDAP  Never done   COVID-19 Vaccine (5 - Booster for Pfizer series) 03/31/2021   Zoster Vaccines- Shingrix (2 of 2) 05/28/2021    Colorectal cancer screening: No longer required.   Mammogram status: No longer required due to age.  Bone Density status: Completed 12/22/20. Results reflect: Bone density results: OSTEOPOROSIS. Repeat every 2 years.   Additional Screening:     Vision Screening: Recommended annual ophthalmology exams for early detection of glaucoma and other disorders of the eye. Is the patient up to date with their annual eye exam?  Yes  Who  is the provider or what is the name of the office  in which the patient attends annual eye exams? Dr Randon Goldsmith  If pt is not established with a provider, would they like to be referred to a provider to establish care? No .   Dental Screening: Recommended annual dental exams for proper oral hygiene  Community Resource Referral / Chronic Care Management: CRR required this visit?  No   CCM required this visit?  No      Plan:     I have personally reviewed and noted the following in the patient's chart:   Medical and social history Use of alcohol, tobacco or illicit drugs  Current medications and supplements including opioid prescriptions.  Functional ability and status Nutritional status Physical activity Advanced directives List of other physicians Hospitalizations, surgeries, and ER visits in previous 12 months Vitals Screenings to include cognitive, depression, and falls Referrals and appointments  In addition, I have reviewed and discussed with patient certain preventive protocols, quality metrics, and best practice recommendations. A written personalized care plan for preventive services as well as general preventive health recommendations were provided to patient.     Marzella Schlein, LPN   [id number]   Nurse Notes: None

## 2021-08-13 ENCOUNTER — Encounter: Payer: Self-pay | Admitting: Family Medicine

## 2021-08-16 ENCOUNTER — Other Ambulatory Visit: Payer: Self-pay

## 2021-08-16 MED ORDER — CLOBETASOL PROPIONATE 0.05 % EX CREA: 1.0000 "application " | TOPICAL_CREAM | CUTANEOUS | 5 refills | Status: DC | PRN

## 2021-09-12 ENCOUNTER — Encounter: Payer: Self-pay | Admitting: Family Medicine

## 2021-09-13 ENCOUNTER — Emergency Department (HOSPITAL_COMMUNITY)
Admission: EM | Admit: 2021-09-13 | Discharge: 2021-09-14 | Disposition: A | Discharge status: Home / Self Care | Payer: Medicare Other | Attending: Emergency Medicine | Admitting: Emergency Medicine

## 2021-09-13 ENCOUNTER — Other Ambulatory Visit: Payer: Self-pay

## 2021-09-13 ENCOUNTER — Emergency Department (HOSPITAL_COMMUNITY): Payer: Medicare Other

## 2021-09-13 DIAGNOSIS — J449 Chronic obstructive pulmonary disease, unspecified: Secondary | ICD-10-CM | POA: Insufficient documentation

## 2021-09-13 DIAGNOSIS — Z20822 Contact with and (suspected) exposure to covid-19: Secondary | ICD-10-CM | POA: Insufficient documentation

## 2021-09-13 DIAGNOSIS — R531 Weakness: Secondary | ICD-10-CM

## 2021-09-13 DIAGNOSIS — G459 Transient cerebral ischemic attack, unspecified: Secondary | ICD-10-CM | POA: Insufficient documentation

## 2021-09-13 DIAGNOSIS — Z7951 Long term (current) use of inhaled steroids: Secondary | ICD-10-CM | POA: Insufficient documentation

## 2021-09-13 DIAGNOSIS — R4182 Altered mental status, unspecified: Secondary | ICD-10-CM | POA: Insufficient documentation

## 2021-09-13 DIAGNOSIS — R410 Disorientation, unspecified: Secondary | ICD-10-CM | POA: Diagnosis not present

## 2021-09-13 DIAGNOSIS — R404 Transient alteration of awareness: Secondary | ICD-10-CM

## 2021-09-13 DIAGNOSIS — I639 Cerebral infarction, unspecified: Secondary | ICD-10-CM | POA: Insufficient documentation

## 2021-09-13 DIAGNOSIS — I10 Essential (primary) hypertension: Secondary | ICD-10-CM | POA: Insufficient documentation

## 2021-09-13 DIAGNOSIS — I160 Hypertensive urgency: Secondary | ICD-10-CM

## 2021-09-13 LAB — COMPREHENSIVE METABOLIC PANEL
ALT: 30 U/L (ref 0–44)
AST: 43 U/L — ABNORMAL HIGH (ref 15–41)
Albumin: 4.1 g/dL (ref 3.5–5.0)
Alkaline Phosphatase: 116 U/L (ref 38–126)
Anion gap: 11 (ref 5–15)
BUN: 12 mg/dL (ref 8–23)
CO2: 23 mmol/L (ref 22–32)
Calcium: 10.5 mg/dL — ABNORMAL HIGH (ref 8.9–10.3)
Chloride: 100 mmol/L (ref 98–111)
Creatinine, Ser: 0.66 mg/dL (ref 0.44–1.00)
GFR, Estimated: >60 mL/min (ref >60)
Glucose, Bld: 132 mg/dL — ABNORMAL HIGH (ref 70–99)
Potassium: 4.1 mmol/L (ref 3.5–5.1)
Sodium: 134 mmol/L — ABNORMAL LOW (ref 135–145)
Total Bilirubin: 0.6 mg/dL (ref 0.3–1.2)
Total Protein: 7.7 g/dL (ref 6.5–8.1)

## 2021-09-13 LAB — DIFFERENTIAL
Abs Immature Granulocytes: 0.03 10*3/uL (ref 0.00–0.07)
Basophils Absolute: 0 10*3/uL (ref 0.0–0.1)
Basophils Relative: 0 %
Eosinophils Absolute: 0.2 10*3/uL (ref 0.0–0.5)
Eosinophils Relative: 3 %
Immature Granulocytes: 0 %
Lymphocytes Relative: 16 %
Lymphs Abs: 1.4 10*3/uL (ref 0.7–4.0)
Monocytes Absolute: 0.5 10*3/uL (ref 0.1–1.0)
Monocytes Relative: 6 %
Neutro Abs: 6.7 10*3/uL (ref 1.7–7.7)
Neutrophils Relative %: 75 %

## 2021-09-13 LAB — CBC
HCT: 44.5 % (ref 36.0–46.0)
Hemoglobin: 14.6 g/dL (ref 12.0–15.0)
MCH: 32.4 pg (ref 26.0–34.0)
MCHC: 32.8 g/dL (ref 30.0–36.0)
MCV: 98.9 fL (ref 80.0–100.0)
Platelets: 204 10*3/uL (ref 150–400)
RBC: 4.5 MIL/uL (ref 3.87–5.11)
RDW: 12.2 % (ref 11.5–15.5)
WBC: 8.9 10*3/uL (ref 4.0–10.5)
nRBC: 0 % (ref 0.0–0.2)

## 2021-09-13 LAB — PROTIME-INR
INR: 1 (ref 0.8–1.2)
Prothrombin Time: 12.7 seconds (ref 11.4–15.2)

## 2021-09-13 LAB — CBG MONITORING, ED: Glucose-Capillary: 117 mg/dL — ABNORMAL HIGH (ref 70–99)

## 2021-09-13 LAB — I-STAT CHEM 8, ED
BUN: 13 mg/dL (ref 8–23)
Calcium, Ion: 1.25 mmol/L (ref 1.15–1.40)
Chloride: 102 mmol/L (ref 98–111)
Creatinine, Ser: 0.6 mg/dL (ref 0.44–1.00)
Glucose, Bld: 126 mg/dL — ABNORMAL HIGH (ref 70–99)
HCT: 45 % (ref 36.0–46.0)
Hemoglobin: 15.3 g/dL — ABNORMAL HIGH (ref 12.0–15.0)
Potassium: 4.1 mmol/L (ref 3.5–5.1)
Sodium: 136 mmol/L (ref 135–145)
TCO2: 28 mmol/L (ref 22–32)

## 2021-09-13 LAB — APTT: aPTT: 36 seconds (ref 24–36)

## 2021-09-13 LAB — ETHANOL: Alcohol, Ethyl (B): <10 mg/dL (ref <10)

## 2021-09-13 IMAGING — CT CT HEAD CODE STROKE
4 series · 16 of 47 positions shown, 18 images · non-contrast
Comparison: None.

CLINICAL DATA: Code stroke.



[Series 3: head wo · axial · 0.45mm/px · z∈[+1358,+1478]mm · 7 of 33 slices shown, 9 images]
[im 5/33  brain]
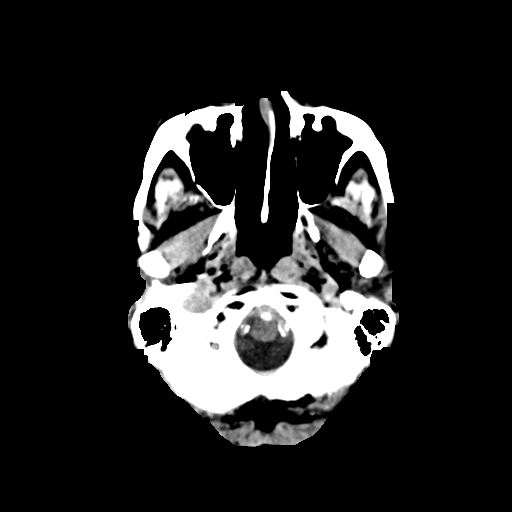
[im 5/33  bone]
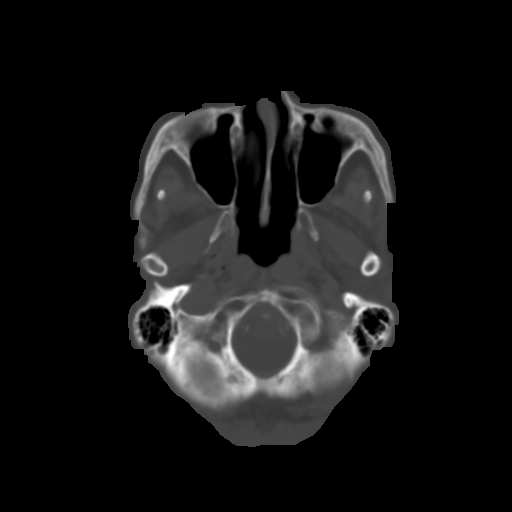
[im 9/33  brain]
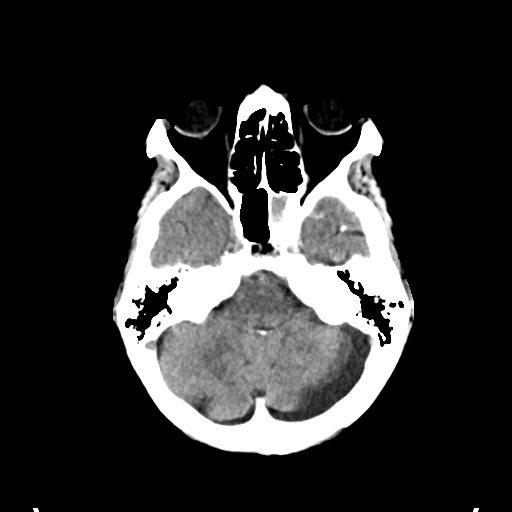
[im 13/33  brain]
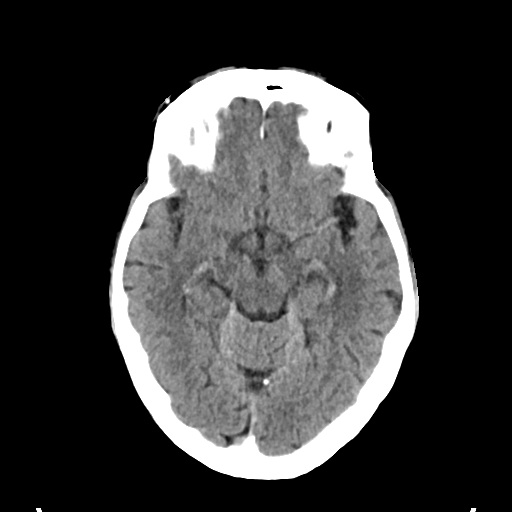
[im 17/33  brain]
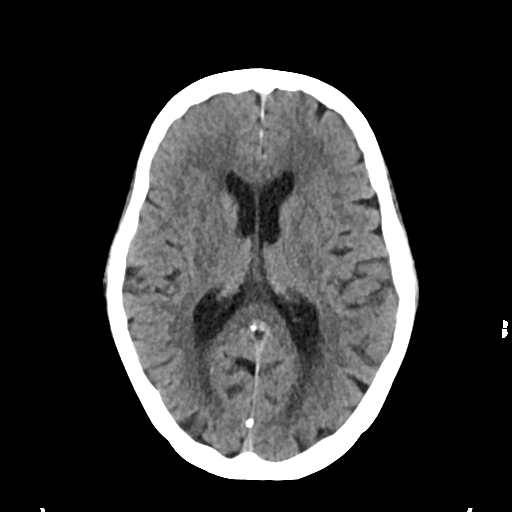
[im 21/33  brain]
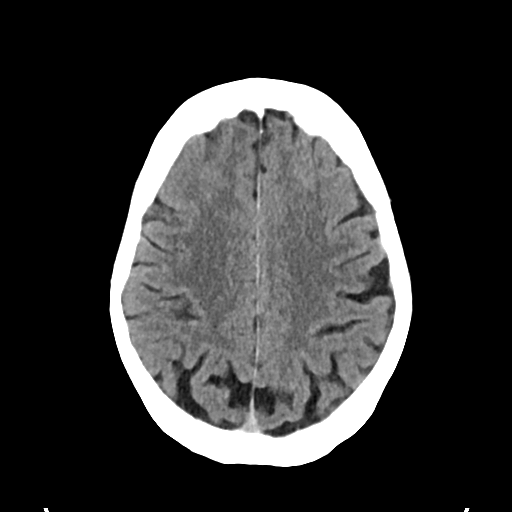
[im 21/33  bone]
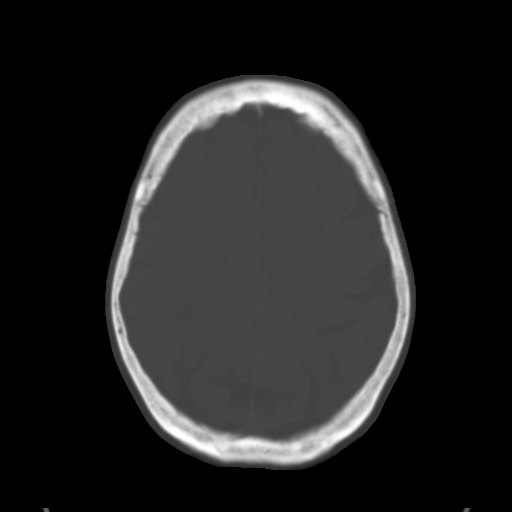
[im 25/33  brain]
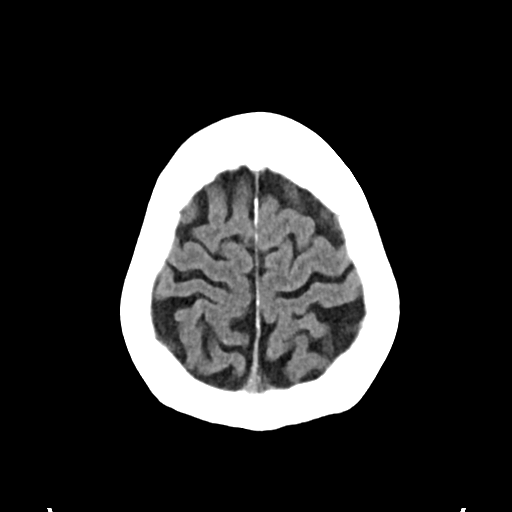
[im 29/33  brain]
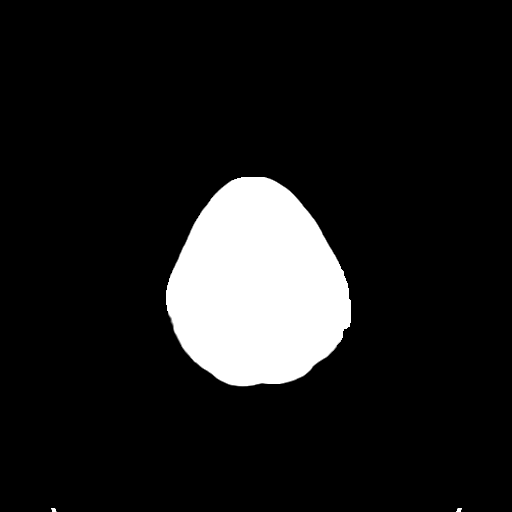

[Series 4: head bone · axial · 0.45mm/px · z∈[+1354,+1386]mm · 3 of 82 slices shown]
[im 9/82  bone]
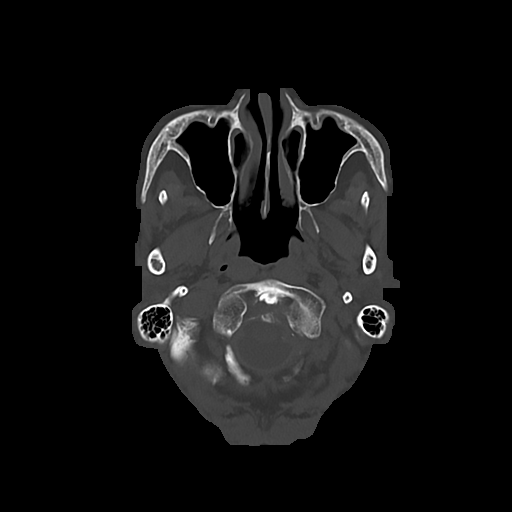
[im 17/82  bone]
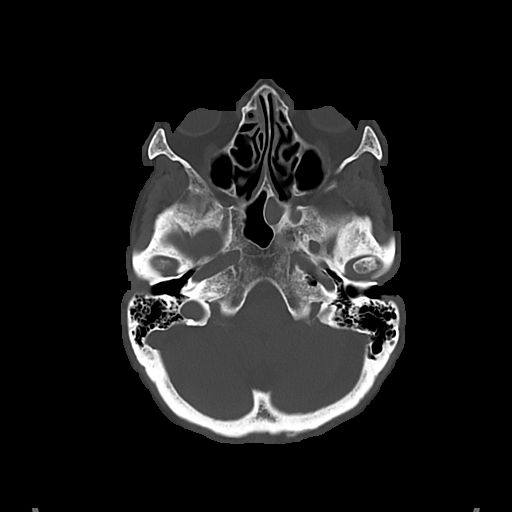
[im 25/82  bone]
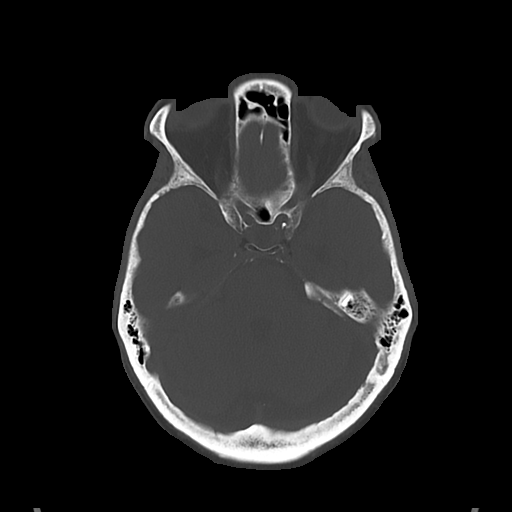

[Series 5: cor soft · coronal · 0.33mm/px · 3 of 71 slices shown]
[im 24/71  brain]
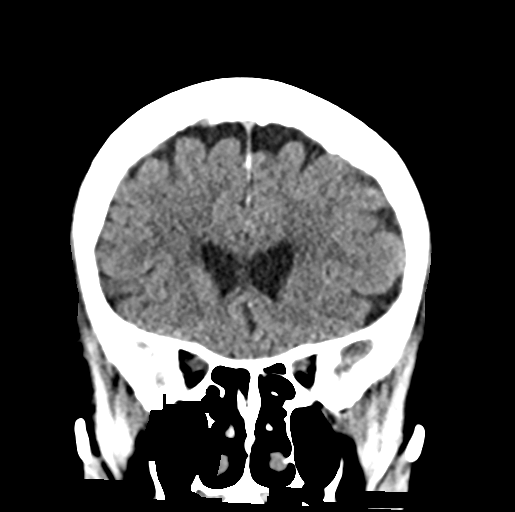
[im 32/71  brain]
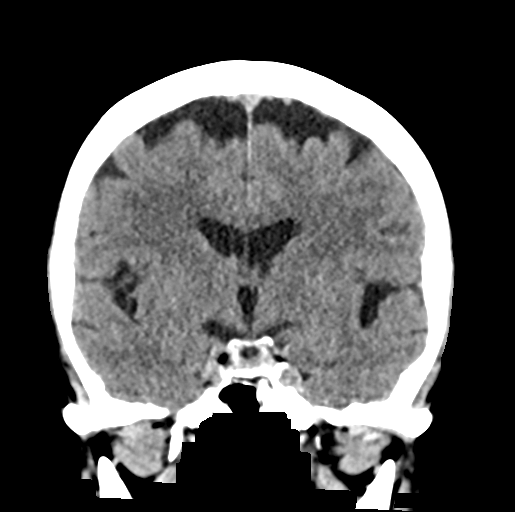
[im 39/71  brain]
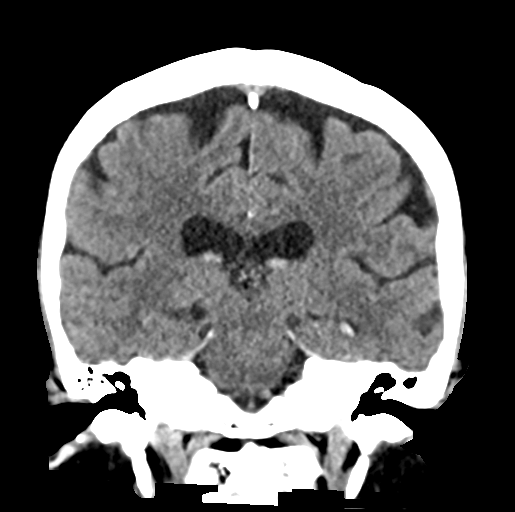

[Series 6: sag soft · sagittal · 0.36mm/px · 3 of 57 slices shown]
[im 19/57  brain]
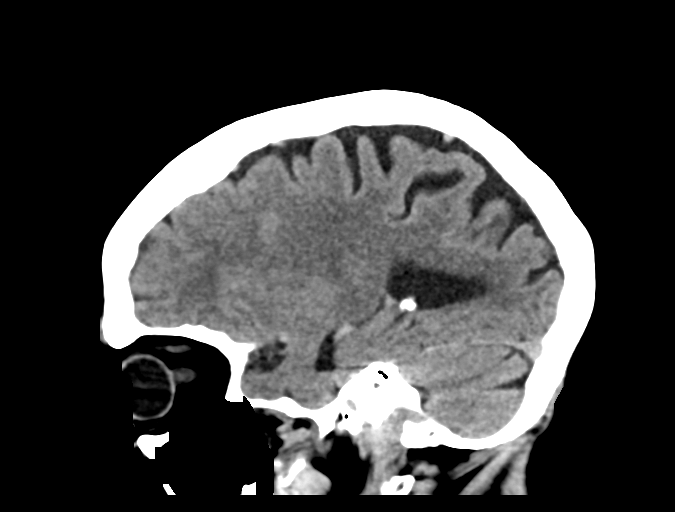
[im 29/57  brain]
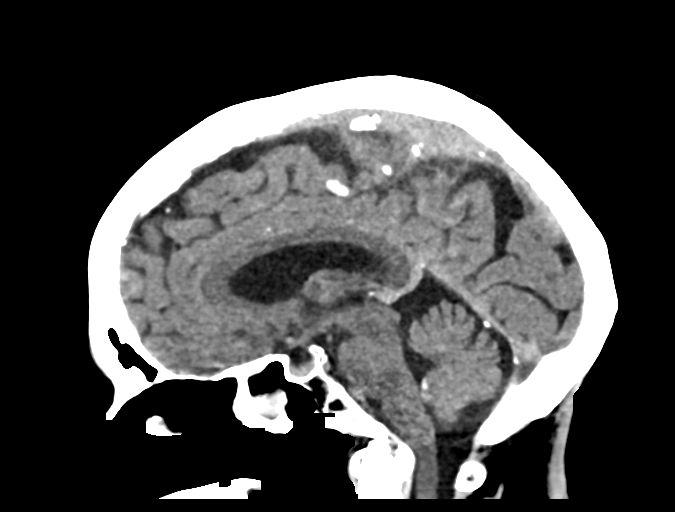
[im 38/57  brain]
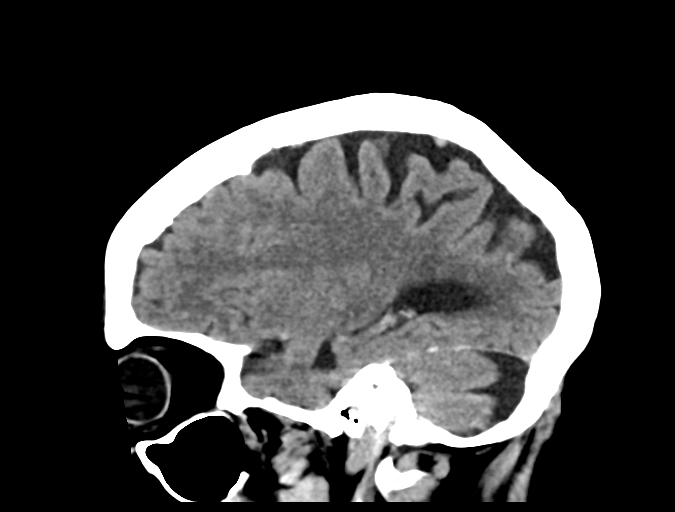

[16 of 47 positions shown; findings below may reference images not displayed]

FINDINGS: Brain: No evidence of acute infarction, hemorrhage, cerebral edema,
mass, mass effect, or midline shift. No hydrocephalus. Left
posterior fossa arachnoid cyst or chronic hygroma. Mild
periventricular white matter changes, likely the sequela of chronic
small vessel ischemic disease. Degree of cerebral volume loss is
within normal limits for age.

Vascular: No hyperdense vessel or unexpected calcification.

Skull: Normal. Negative for fracture or focal lesion.

Sinuses/Orbits: No acute finding.

Other: The mastoid air cells are well aerated.

ASPECTS (Alberta Stroke Program Early CT Score)

- Ganglionic level infarction (caudate, lentiform nuclei, internal
capsule, insula, M1-M3 cortex): 7

- Supraganglionic infarction (M4-M6 cortex): 3

Total score (0-10 with 10 being normal): 10
IMPRESSION: 1. No acute intracranial process.
2. ASPECTS is 10

pm to provider Dr. YAKY via secure text paging.

## 2021-09-13 NOTE — Code Documentation (Addendum)
Responded to Code Stroke called on pt already in the ED. Pt came to ED d/t difficulty concentrating and inability to answer questions. Code Stroke was called at 2312 for lower extremity weakness, [id number]. CBG-117, NIH-1 for L leg drift. Per pt, L leg is very painful d/t torn meniscus. CT head negative for acute changes. Code Stroke cancelled at 2332. Plan metabolic workup and MRI.

## 2021-09-13 NOTE — ED Provider Notes (Signed)
Received signout from previous provider, please see her note for complete H&P.  This is a 88 year old female who apparently had an episode where she became confused and having some trouble walking last known normal few hours ago however her symptom has since improving.  Initially a code stroke was initiated, I called neurologist has evaluated patient and have cancel code stroke  11:54 PM Neurologist, Dr. Carolin Guernsey does request for a noncontrast brain MRI since patient is hypertensive with a current blood pressure of 217/74.  If negative, no further stroke work-up indicated according to him.  We will recheck vital sign and likely allow permissive hypertension until MRI results.  4:41 AM Labs, and imaging was independently reviewed interpreted by me.  Urinalysis without signs of urinary tract infection, normal UDS, normal electrolyte panel, normal WBC, normal H&H, normal viral respiratory panel.  Head CT scan obtained and is unremarkable, brain MRI without any acute infarct or acute intracranial process.  There is a foci of hemosiderin deposition without any significant edema to suggest recent hemorrhage.  On blood pressure recheck, patient remains hypertensive with a systolic blood pressure of 214.  Patient takes sotalol 80 mg tablet at home.  I have ordered a dose of the medication and will also give 10 mg of hydralazine for better blood pressure control.  I plan to reconsult neurology and likely admit patient for further blood pressure management as it may contribute to her symptom.  5:29 AM I discussed plan on better blood pressure control including admission however patient declined admission at this time.  She prefers close follow-up with her PCP for further assessment and management of her blood pressure.  She is amenable for IV hydralazine.  At this time patient is able to make informed decision, she does not have any focal neurodeficit, she is not aphasic.  6:01 AM I did consulted neurologist  Dr. Jonny Ruiz who recommend close f/u with PCP for blood pressure control and also to avoid NSAIDs due to risk of bleeding.  Return precaution given.  BP did improve with medication in the ER. Pt able to ambulate, she's agreeable with plan.  Return precaution given.    .Critical Care Performed by: Domenic Moras, PA-C Authorized by: Domenic Moras, PA-C   Critical care provider statement:    Critical care time (minutes):  30   Critical care was time spent personally by me on the following activities:  Development of treatment plan with patient or surrogate, discussions with consultants, evaluation of patient's response to treatment, examination of patient, ordering and review of laboratory studies, ordering and review of radiographic studies, ordering and performing treatments and interventions, pulse oximetry, re-evaluation of patient's condition and review of old charts   BP (!) 153/53    Pulse 84    Temp 98.1 F (36.7 C) (Oral)    Resp 18    Wt 57.2 kg    SpO2 98%    BMI 20.97 kg/m   Results for orders placed or performed during the hospital encounter of 09/13/21  Resp Panel by RT-PCR (Flu A&B, Covid) Nasopharyngeal Swab   Specimen: Nasopharyngeal Swab; Nasopharyngeal(NP) swabs in vial transport medium  Result Value Ref Range   SARS Coronavirus 2 by RT PCR NEGATIVE NEGATIVE   Influenza A by PCR NEGATIVE NEGATIVE   Influenza B by PCR NEGATIVE NEGATIVE  Ethanol  Result Value Ref Range   Alcohol, Ethyl (B) <10 <10 mg/dL  Protime-INR  Result Value Ref Range   Prothrombin Time 12.7 11.4 - 15.2 seconds  INR 1.0 0.8 - 1.2  APTT  Result Value Ref Range   aPTT 36 24 - 36 seconds  CBC  Result Value Ref Range   WBC 8.9 4.0 - 10.5 K/uL   RBC 4.50 3.87 - 5.11 MIL/uL   Hemoglobin 14.6 12.0 - 15.0 g/dL   HCT 44.5 36.0 - 46.0 %   MCV 98.9 80.0 - 100.0 fL   MCH 32.4 26.0 - 34.0 pg   MCHC 32.8 30.0 - 36.0 g/dL   RDW 12.2 11.5 - 15.5 %   Platelets 204 150 - 400 K/uL   nRBC 0.0 0.0 - 0.2 %   Differential  Result Value Ref Range   Neutrophils Relative % 75 %   Neutro Abs 6.7 1.7 - 7.7 K/uL   Lymphocytes Relative 16 %   Lymphs Abs 1.4 0.7 - 4.0 K/uL   Monocytes Relative 6 %   Monocytes Absolute 0.5 0.1 - 1.0 K/uL   Eosinophils Relative 3 %   Eosinophils Absolute 0.2 0.0 - 0.5 K/uL   Basophils Relative 0 %   Basophils Absolute 0.0 0.0 - 0.1 K/uL   Immature Granulocytes 0 %   Abs Immature Granulocytes 0.03 0.00 - 0.07 K/uL  Comprehensive metabolic panel  Result Value Ref Range   Sodium 134 (L) 135 - 145 mmol/L   Potassium 4.1 3.5 - 5.1 mmol/L   Chloride 100 98 - 111 mmol/L   CO2 23 22 - 32 mmol/L   Glucose, Bld 132 (H) 70 - 99 mg/dL   BUN 12 8 - 23 mg/dL   Creatinine, Ser 0.66 0.44 - 1.00 mg/dL   Calcium 10.5 (H) 8.9 - 10.3 mg/dL   Total Protein 7.7 6.5 - 8.1 g/dL   Albumin 4.1 3.5 - 5.0 g/dL   AST 43 (H) 15 - 41 U/L   ALT 30 0 - 44 U/L   Alkaline Phosphatase 116 38 - 126 U/L   Total Bilirubin 0.6 0.3 - 1.2 mg/dL   GFR, Estimated >60 >60 mL/min   Anion gap 11 5 - 15  Urine rapid drug screen (hosp performed)  Result Value Ref Range   Opiates NONE DETECTED NONE DETECTED   Cocaine NONE DETECTED NONE DETECTED   Benzodiazepines NONE DETECTED NONE DETECTED   Amphetamines NONE DETECTED NONE DETECTED   Tetrahydrocannabinol NONE DETECTED NONE DETECTED   Barbiturates NONE DETECTED NONE DETECTED  Urinalysis, Routine w reflex microscopic Urine, Clean Catch  Result Value Ref Range   Color, Urine YELLOW YELLOW   APPearance CLEAR CLEAR   Specific Gravity, Urine 1.010 1.005 - 1.030   pH 7.0 5.0 - 8.0   Glucose, UA NEGATIVE NEGATIVE mg/dL   Hgb urine dipstick TRACE (A) NEGATIVE   Bilirubin Urine NEGATIVE NEGATIVE   Ketones, ur NEGATIVE NEGATIVE mg/dL   Protein, ur NEGATIVE NEGATIVE mg/dL   Nitrite NEGATIVE NEGATIVE   Leukocytes,Ua NEGATIVE NEGATIVE  Urinalysis, Microscopic (reflex)  Result Value Ref Range   RBC / HPF 0-5 0 - 5 RBC/hpf   WBC, UA 0-5 0 - 5 WBC/hpf    Bacteria, UA NONE SEEN NONE SEEN   Squamous Epithelial / LPF 0-5 0 - 5  I-stat chem 8, ED  Result Value Ref Range   Sodium 136 135 - 145 mmol/L   Potassium 4.1 3.5 - 5.1 mmol/L   Chloride 102 98 - 111 mmol/L   BUN 13 8 - 23 mg/dL   Creatinine, Ser 0.60 0.44 - 1.00 mg/dL   Glucose, Bld 126 (H) 70 - 99 mg/dL  Calcium, Ion 1.25 1.15 - 1.40 mmol/L   TCO2 28 22 - 32 mmol/L   Hemoglobin 15.3 (H) 12.0 - 15.0 g/dL   HCT 45.0 36.0 - 46.0 %  CBG monitoring, ED  Result Value Ref Range   Glucose-Capillary 117 (H) 70 - 99 mg/dL   MR BRAIN WO CONTRAST  Result Date: 09/14/2021 CLINICAL DATA:  Stroke suspected, confusion, difficulty walking EXAM: MRI HEAD WITHOUT CONTRAST TECHNIQUE: Multiplanar, multiecho pulse sequences of the brain and surrounding structures were obtained without intravenous contrast. COMPARISON:  No prior MRI, correlation made with CT head 09/13/2021 FINDINGS: Brain: No restricted diffusion to suggest acute or subacute infarct. No acute hemorrhage, mass, mass effect, or midline shift. No hydrocephalus or extra-axial collection. Foci of hemosiderin deposition in the left cerebellum (series 14, image 16), adjacent to a developmental venous anomaly, which may represent cavernous malformations without edema to suggest recent hemorrhage. Minimal T2 hyperintense signal in the periventricular white matter, likely the sequela of mild chronic small vessel ischemic disease. Vascular: Normal arterial flow voids. Skull and upper cervical spine: Normal marrow signal. Sinuses/Orbits: Filling of the left sphenoid sinus, likely chronic. Status post bilateral lens replacements. Other: The mastoids are well aerated. IMPRESSION: 1. No acute infarct or other acute intracranial process. 2. Foci of hemosiderin deposition in the left-greater-than-right which may represent hypertensive microhemorrhages or multiple small cavernomas, with an adjacent developmental venous anomaly. No significant edema in this area to  suggest recent hemorrhage. Electronically Signed   By: Merilyn Baba M.D.   On: 09/14/2021 03:42   CT HEAD CODE STROKE WO CONTRAST  Result Date: 09/13/2021 CLINICAL DATA:  Code stroke. EXAM: CT HEAD WITHOUT CONTRAST TECHNIQUE: Contiguous axial images were obtained from the base of the skull through the vertex without intravenous contrast. RADIATION DOSE REDUCTION: This exam was performed according to the departmental dose-optimization program which includes automated exposure control, adjustment of the mA and/or kV according to patient size and/or use of iterative reconstruction technique. COMPARISON:  None. FINDINGS: Brain: No evidence of acute infarction, hemorrhage, cerebral edema, mass, mass effect, or midline shift. No hydrocephalus. Left posterior fossa arachnoid cyst or chronic hygroma. Mild periventricular white matter changes, likely the sequela of chronic small vessel ischemic disease. Degree of cerebral volume loss is within normal limits for age. Vascular: No hyperdense vessel or unexpected calcification. Skull: Normal. Negative for fracture or focal lesion. Sinuses/Orbits: No acute finding. Other: The mastoid air cells are well aerated. ASPECTS [facility] Stroke Program Early CT Score) - Ganglionic level infarction (caudate, lentiform nuclei, internal capsule, insula, M1-M3 cortex): 7 - Supraganglionic infarction (M4-M6 cortex): 3 Total score (0-10 with 10 being normal): 10 IMPRESSION: 1. No acute intracranial process. 2. ASPECTS is 10 Code stroke imaging results were communicated on 09/13/2021 at 11:28 pm to provider Dr. Lorrin Goodell via secure text paging. Electronically Signed   By: Merilyn Baba M.D.   On: 09/13/2021 23:29      Domenic Moras, PA-C 09/14/21 [id number]    Merryl Hacker, MD 09/14/21 [phone]

## 2021-09-13 NOTE — ED Provider Notes (Signed)
[facility] EMERGENCY DEPARTMENT Provider Note   CSN: [id number] Arrival date & time: 09/13/21  2233     History  Chief Complaint  Patient presents with   Altered Mental Status    Linda [name] is a 88 y.o. female.  Pt was talking to her daughter at 8:40 tonight and became confused.  Pt reports when she got off the phone she had trouble walking.  Pt reports she is feeling better.  Pt's daughter reports pt has improved since she talked to her.  Pt denies history of hypertension   The history is provided by the patient and a relative. No language interpreter was used.  Altered Mental Status Presenting symptoms: confusion   Severity:  Moderate Most recent episode:  Today Episode history:  Single Timing:  Constant Progression:  Worsening Chronicity:  New Associated symptoms: weakness   Associated symptoms: no fever, no nausea and no vomiting       Home Medications Prior to Admission medications   Medication Sig Start Date End Date Taking? Authorizing Provider  Cholecalciferol (VITAMIN D3) 25 MCG (1000 UT) CAPS Take 1 capsule by mouth daily.    [provider]  clobetasol cream (TEMOVATE) [id number] % Apply 1 application topically as needed. 08/16/21   Vivi Barrack, MD  famotidine (PEPCID) 20 MG tablet Take 1 tablet (20 mg total) by mouth 2 (two) times daily. 03/09/21   Vivi Barrack, MD  FLUAD 0.5 ML SUSY  04/24/18   [provider]  fluticasone (FLONASE) 50 MCG/ACT nasal spray USE 1 SPRAY IN EACH NOSTRIL DAILY AS NEEDED FOR ALLERGIES OR RHINITIS 07/27/20   Orma Flaming, MD  rosuvastatin (CRESTOR) 20 MG tablet Take 1 tablet (20 mg total) by mouth daily. 03/09/21   Vivi Barrack, MD  sotalol (BETAPACE) 80 MG tablet Take 1 tablet (80 mg total) by mouth every 12 (twelve) hours. 05/25/21   Vivi Barrack, MD  timolol (TIMOPTIC) 0.25 % ophthalmic solution  02/22/19   [provider]      Allergies    Patient has no known allergies.     Review of Systems   Review of Systems  Constitutional:  Negative for fever.  Gastrointestinal:  Negative for nausea and vomiting.  Neurological:  Positive for weakness.  Psychiatric/Behavioral:  Positive for confusion.   All other systems reviewed and are negative.  Physical Exam Updated Vital Signs BP (!) 217/74 (BP Location: Right Arm)    Pulse 75    Temp 98.1 F (36.7 C) (Oral)    Resp 16    SpO2 97%  Physical Exam Vitals and nursing note reviewed.  Constitutional:      Appearance: Normal appearance. She is well-developed.  HENT:     Head: Normocephalic and atraumatic.     Nose: Nose normal.     Mouth/Throat:     Mouth: Mucous membranes are moist.  Eyes:     Extraocular Movements: Extraocular movements intact.     Pupils: Pupils are equal, round, and reactive to light.  Cardiovascular:     Rate and Rhythm: Normal rate and regular rhythm.  Pulmonary:     Effort: Pulmonary effort is normal.  Abdominal:     General: Abdomen is flat. There is no distension.  Musculoskeletal:        General: Normal range of motion.     Cervical back: Normal range of motion.     Right lower leg: No edema.     Left lower leg: No edema.  Comments: Left leg weakness  from   Skin:    General: Skin is warm.  Neurological:     Mental Status: She is alert and oriented to person, place, and time.     Cranial Nerves: No cranial nerve deficit.     Sensory: No sensory deficit.     Motor: No weakness.     Coordination: Coordination normal.  Psychiatric:        Mood and Affect: Mood normal.    ED Results / Procedures / Treatments   Labs (all labs ordered are listed, but only abnormal results are displayed) Labs Reviewed  RESP PANEL BY RT-PCR (FLU A&B, COVID) ARPGX2  ETHANOL  PROTIME-INR  APTT  CBC  DIFFERENTIAL  COMPREHENSIVE METABOLIC PANEL  RAPID URINE DRUG SCREEN, HOSP PERFORMED  URINALYSIS, ROUTINE W REFLEX MICROSCOPIC  I-STAT CHEM 8, ED    EKG None  Radiology CT HEAD  CODE STROKE WO CONTRAST  Result Date: 09/13/2021 CLINICAL DATA:  Code stroke. EXAM: CT HEAD WITHOUT CONTRAST TECHNIQUE: Contiguous axial images were obtained from the base of the skull through the vertex without intravenous contrast. RADIATION DOSE REDUCTION: This exam was performed according to the departmental dose-optimization program which includes automated exposure control, adjustment of the mA and/or kV according to patient size and/or use of iterative reconstruction technique. COMPARISON:  None. FINDINGS: Brain: No evidence of acute infarction, hemorrhage, cerebral edema, mass, mass effect, or midline shift. No hydrocephalus. Left posterior fossa arachnoid cyst or chronic hygroma. Mild periventricular white matter changes, likely the sequela of chronic small vessel ischemic disease. Degree of cerebral volume loss is within normal limits for age. Vascular: No hyperdense vessel or unexpected calcification. Skull: Normal. Negative for fracture or focal lesion. Sinuses/Orbits: No acute finding. Other: The mastoid air cells are well aerated. ASPECTS [facility] Stroke Program Early CT Score) - Ganglionic level infarction (caudate, lentiform nuclei, internal capsule, insula, M1-M3 cortex): 7 - Supraganglionic infarction (M4-M6 cortex): 3 Total score (0-10 with 10 being normal): 10 IMPRESSION: 1. No acute intracranial process. 2. ASPECTS is 10 Code stroke imaging results were communicated on 09/13/2021 at 11:28 pm to provider Dr. Lorrin Goodell via secure text paging. Electronically Signed   By: Merilyn Baba M.D.   On: 09/13/2021 23:29    Procedures Procedures    Medications Ordered in ED Medications - No data to display  ED Course/ Medical Decision Making/ A&P                           Medical Decision Making Problems Addressed: Transient alteration of awareness: acute illness or injury  Amount and/or Complexity of Data Reviewed Independent Historian:     Details: Daughter here with pt and provides  histroy of pt becoming confused at 8:40 Labs: ordered. Radiology: ordered and independent interpretation performed. Decision-making details documented in ED Course.    Details: Ct head no acute stroke ECG/medicine tests: ordered and independent interpretation performed. Decision-making details documented in ED Course. Discussion of management or test interpretation with external provider(s): Dr. Lorrin Goodell  Neurologist evaluated pt,  Code stroke canceled.    Pt's care turned over to Domenic Moras [facility]         Final Clinical Impression(s) / ED Diagnoses Final diagnoses:  Transient alteration of awareness  Weakness    Rx / DC Orders ED Discharge Orders     None         Sidney Ace 09/13/21 2351    Valarie Merino,  MD 09/14/21 1147

## 2021-09-13 NOTE — ED Triage Notes (Signed)
Pt bib [facility] from [employer] c/o difficulty concentrating and answering questions. Pt is HTN. Pt A&Ox4 upon arrival, able to answer all questions.   EMS vitals 160/102 BP 70 HR 97% O2 RA 125 CBG

## 2021-09-14 ENCOUNTER — Emergency Department (HOSPITAL_COMMUNITY)
Admission: EM | Admit: 2021-09-14 | Discharge: 2021-09-14 | Discharge status: Against Medical Advice | Payer: Medicare Other | Source: Home / Self Care | Attending: Emergency Medicine | Admitting: Emergency Medicine

## 2021-09-14 ENCOUNTER — Emergency Department (HOSPITAL_COMMUNITY): Payer: Medicare Other

## 2021-09-14 ENCOUNTER — Ambulatory Visit: Payer: Medicare Other | Admitting: Family Medicine

## 2021-09-14 DIAGNOSIS — R404 Transient alteration of awareness: Secondary | ICD-10-CM | POA: Diagnosis not present

## 2021-09-14 DIAGNOSIS — R531 Weakness: Secondary | ICD-10-CM | POA: Diagnosis not present

## 2021-09-14 DIAGNOSIS — R479 Unspecified speech disturbances: Secondary | ICD-10-CM

## 2021-09-14 DIAGNOSIS — G459 Transient cerebral ischemic attack, unspecified: Secondary | ICD-10-CM

## 2021-09-14 DIAGNOSIS — I10 Essential (primary) hypertension: Secondary | ICD-10-CM | POA: Insufficient documentation

## 2021-09-14 DIAGNOSIS — J449 Chronic obstructive pulmonary disease, unspecified: Secondary | ICD-10-CM | POA: Insufficient documentation

## 2021-09-14 LAB — RAPID URINE DRUG SCREEN, HOSP PERFORMED
Amphetamines: NOT DETECTED
Barbiturates: NOT DETECTED
Benzodiazepines: NOT DETECTED
Cocaine: NOT DETECTED
Opiates: NOT DETECTED
Tetrahydrocannabinol: NOT DETECTED

## 2021-09-14 LAB — BASIC METABOLIC PANEL
Anion gap: 12 (ref 5–15)
BUN: 11 mg/dL (ref 8–23)
CO2: 22 mmol/L (ref 22–32)
Calcium: 10.3 mg/dL (ref 8.9–10.3)
Chloride: 99 mmol/L (ref 98–111)
Creatinine, Ser: 0.71 mg/dL (ref 0.44–1.00)
GFR, Estimated: >60 mL/min (ref >60)
Glucose, Bld: 120 mg/dL — ABNORMAL HIGH (ref 70–99)
Potassium: 4 mmol/L (ref 3.5–5.1)
Sodium: 133 mmol/L — ABNORMAL LOW (ref 135–145)

## 2021-09-14 LAB — CBC WITH DIFFERENTIAL/PLATELET
Abs Immature Granulocytes: 0.03 10*3/uL (ref 0.00–0.07)
Basophils Absolute: 0 10*3/uL (ref 0.0–0.1)
Basophils Relative: 0 %
Eosinophils Absolute: 0.1 10*3/uL (ref 0.0–0.5)
Eosinophils Relative: 1 %
HCT: 41 % (ref 36.0–46.0)
Hemoglobin: 13.3 g/dL (ref 12.0–15.0)
Immature Granulocytes: 0 %
Lymphocytes Relative: 15 %
Lymphs Abs: 1.3 10*3/uL (ref 0.7–4.0)
MCH: 31.8 pg (ref 26.0–34.0)
MCHC: 32.4 g/dL (ref 30.0–36.0)
MCV: 98.1 fL (ref 80.0–100.0)
Monocytes Absolute: 0.6 10*3/uL (ref 0.1–1.0)
Monocytes Relative: 7 %
Neutro Abs: 6.4 10*3/uL (ref 1.7–7.7)
Neutrophils Relative %: 77 %
Platelets: 232 10*3/uL (ref 150–400)
RBC: 4.18 MIL/uL (ref 3.87–5.11)
RDW: 12.3 % (ref 11.5–15.5)
WBC: 8.5 10*3/uL (ref 4.0–10.5)
nRBC: 0 % (ref 0.0–0.2)

## 2021-09-14 LAB — URINALYSIS, MICROSCOPIC (REFLEX): Bacteria, UA: NONE SEEN

## 2021-09-14 LAB — TSH: TSH: 1 u[IU]/mL (ref 0.350–4.500)

## 2021-09-14 LAB — TROPONIN I (HIGH SENSITIVITY): Troponin I (High Sensitivity): 14 ng/L (ref <18)

## 2021-09-14 LAB — RESP PANEL BY RT-PCR (FLU A&B, COVID) ARPGX2
Influenza A by PCR: NEGATIVE
Influenza B by PCR: NEGATIVE
SARS Coronavirus 2 by RT PCR: NEGATIVE

## 2021-09-14 LAB — URINALYSIS, ROUTINE W REFLEX MICROSCOPIC
Bilirubin Urine: NEGATIVE
Glucose, UA: NEGATIVE mg/dL
Ketones, ur: NEGATIVE mg/dL
Leukocytes,Ua: NEGATIVE
Nitrite: NEGATIVE
Protein, ur: NEGATIVE mg/dL
Specific Gravity, Urine: 1.01 (ref 1.005–1.030)
pH: 7 (ref 5.0–8.0)

## 2021-09-14 LAB — CBG MONITORING, ED: Glucose-Capillary: 117 mg/dL — ABNORMAL HIGH (ref 70–99)

## 2021-09-14 IMAGING — MR MR HEAD W/O CM
12 of 13 series · 44 of 48 positions shown · non-contrast
Comparison: No prior MRI, correlation made with CT head [DATE]

CLINICAL DATA: Stroke suspected, confusion, difficulty walking

EXAM:
MRI HEAD WITHOUT CONTRAST
TECHNIQUE: Multiplanar, multiecho pulse sequences of the brain and surrounding
structures were obtained without intravenous contrast.

[Series 5: DWI · axial · 3.0mm · 0.92mm/px · z∈[-93,+66]mm · 7 of 108 slices shown (1 of 4)]
[im 1/108]
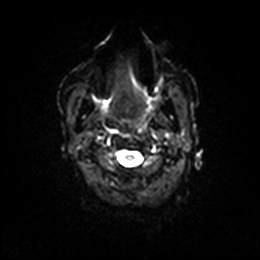
[im 18/108]
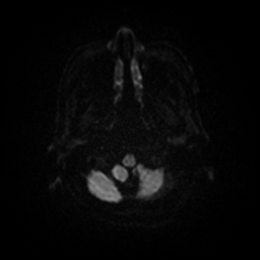
[im 36/108]
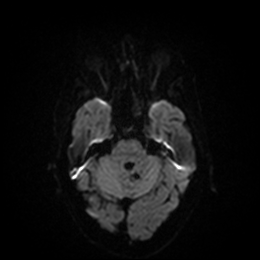
[im 54/108]
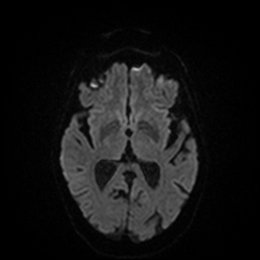
[im 72/108]
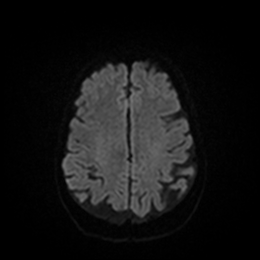
[im 90/108]
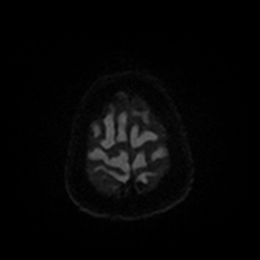
[im 108/108]
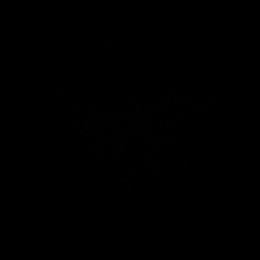

[Series 6: DWI · axial · 3.0mm · 0.92mm/px · z∈[-93,+63]mm · 4 of 53 slices shown (2 of 4)]
[im 1/53]
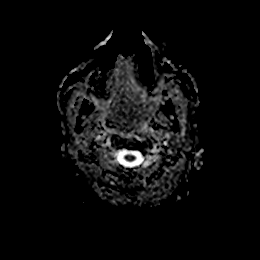
[im 18/53]
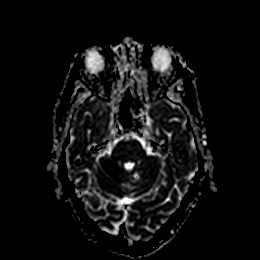
[im 35/53]
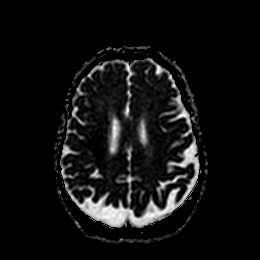
[im 53/53]
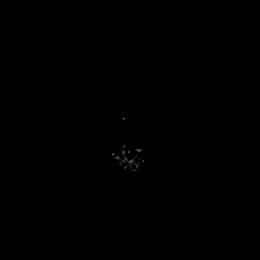

[Series 7: DWI · coronal · 4.0mm · 0.88mm/px · 6 of 80 slices shown (3 of 4)]
[im 1/80]
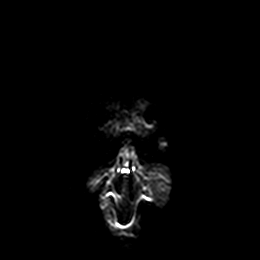
[im 16/80]
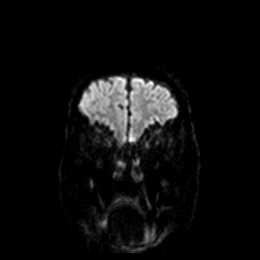
[im 32/80]
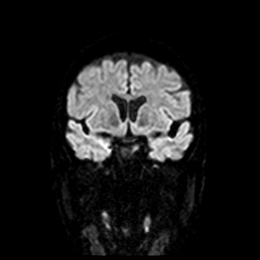
[im 48/80]
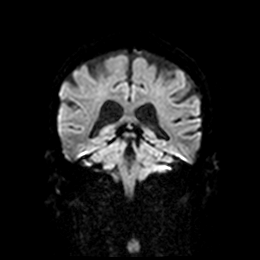
[im 64/80]
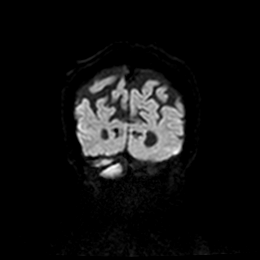
[im 80/80]
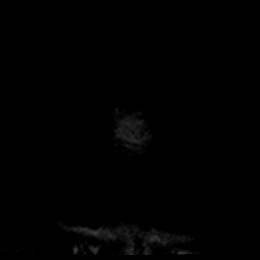

[Series 8: DWI · coronal · 4.0mm · 0.88mm/px · 3 of 40 slices shown (4 of 4)]
[im 1/40]
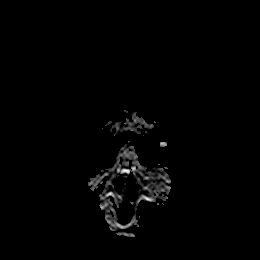
[im 20/40]
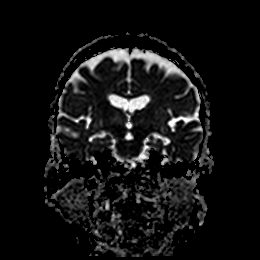
[im 40/40]
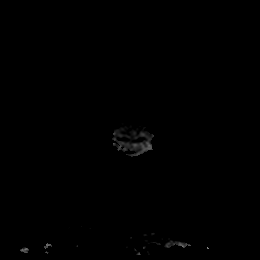

[Series 9: T1 · sagittal · 5.0mm · 0.75mm/px · 2 of 27 slices shown]
[im 1/27]
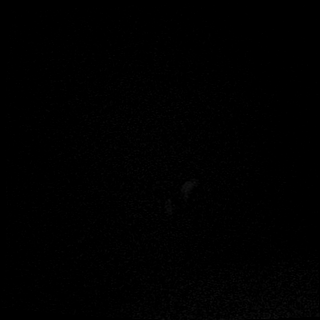
[im 27/27]
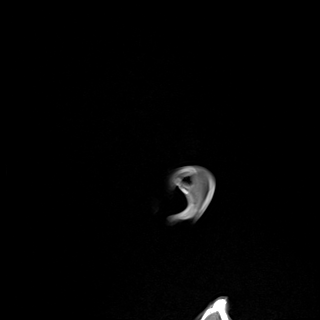

[Series 10: T2 · axial · 5.0mm · 0.75mm/px · z∈[-94,+68]mm · 2 of 28 slices shown (1 of 2)]
[im 1/28]
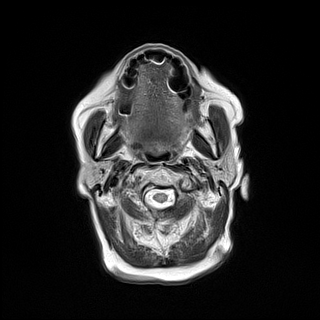
[im 28/28]
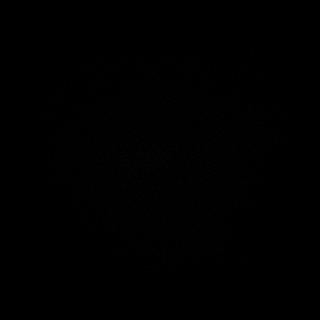

[Series 11: FLAIR · axial · 5.0mm · 0.45mm/px · z∈[-93,+69]mm · 2 of 28 slices shown]
[im 1/28]
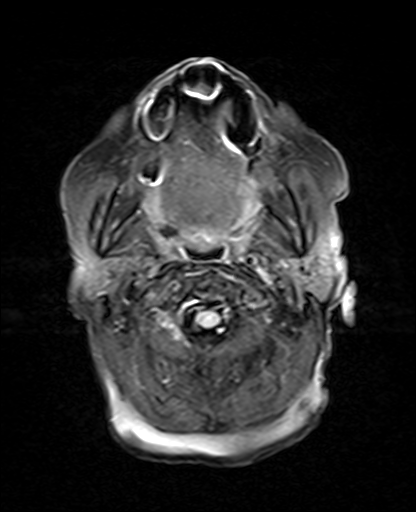
[im 28/28]
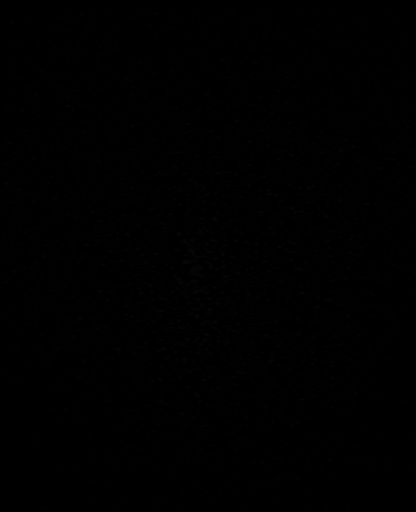

[Series 12: mag_images · axial · 3.0mm · 0.94mm/px · z∈[-95,+70]mm · 4 of 56 slices shown]
[im 1/56]
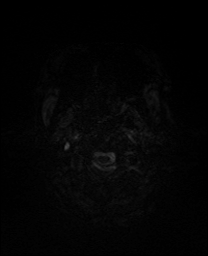
[im 19/56]
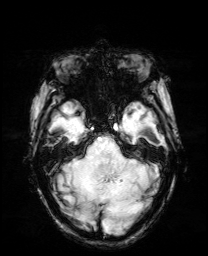
[im 37/56]
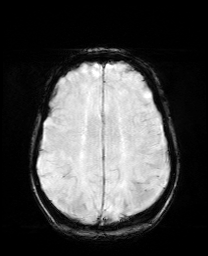
[im 56/56]
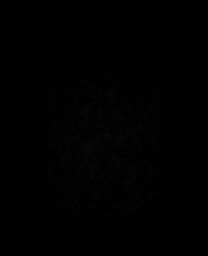

[Series 13: pha_images · axial · 3.0mm · 0.94mm/px · z∈[-92,+64]mm · 4 of 53 slices shown]
[im 1/53]
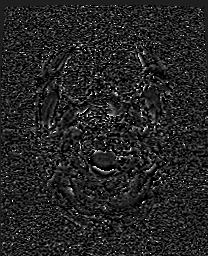
[im 18/53]
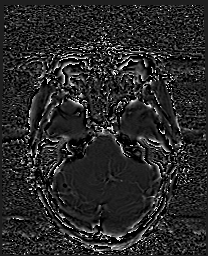
[im 35/53]
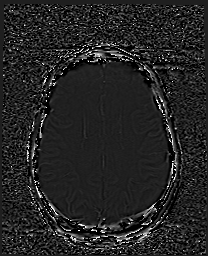
[im 53/53]
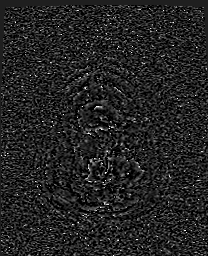

[Series 14: swi_images · axial · 3.0mm · 0.94mm/px · z∈[-95,+70]mm · 4 of 56 slices shown]
[im 1/56]
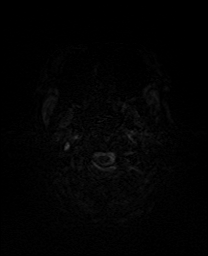
[im 19/56]
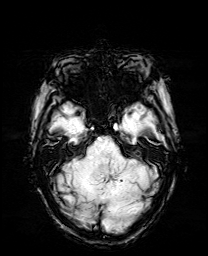
[im 37/56]
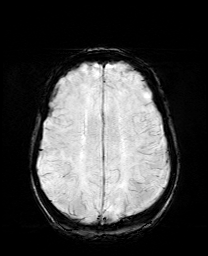
[im 56/56]
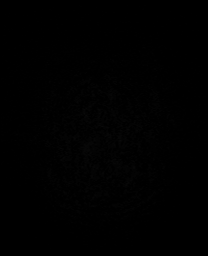

[Series 15: mip_images(sw) · axial · 24.0mm · 0.94mm/px · z∈[-84,+60]mm · 4 of 49 slices shown]
[im 1/49]
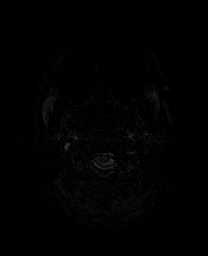
[im 17/49]
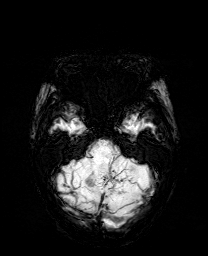
[im 33/49]
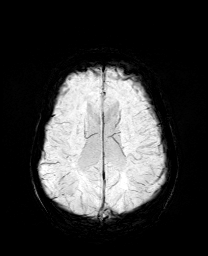
[im 49/49]
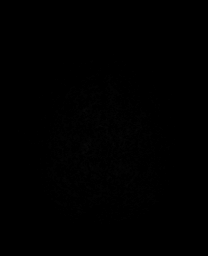

[Series 17: T2 · coronal · 5.0mm · 0.34mm/px · 2 of 33 slices shown (2 of 2)]
[im 1/33]
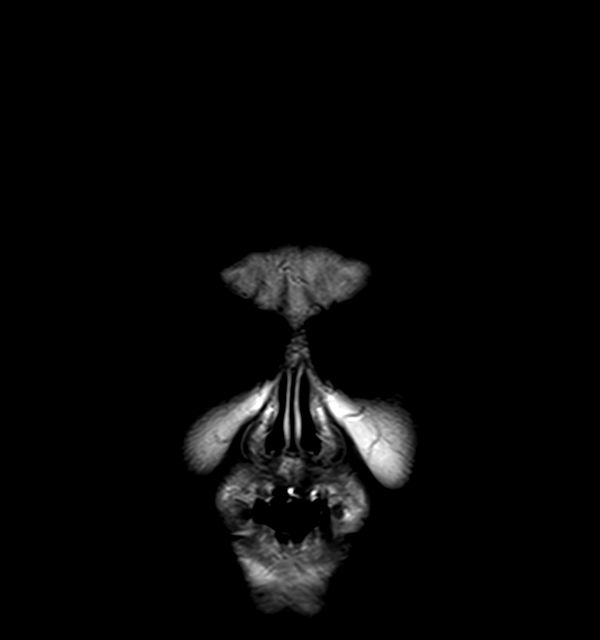
[im 33/33]
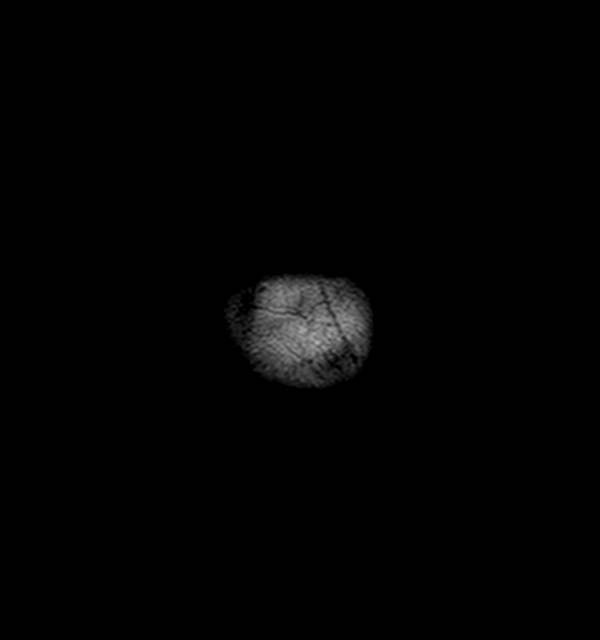

[44 of 48 positions shown; findings below may reference images not displayed]

FINDINGS: Brain: No restricted diffusion to suggest acute or subacute infarct.
No acute hemorrhage, mass, mass effect, or midline shift. No
hydrocephalus or extra-axial collection. Foci of hemosiderin
deposition in the left cerebellum (series 14, image 16), adjacent to
a developmental venous anomaly, which may represent cavernous
malformations without edema to suggest recent hemorrhage. Minimal T2
hyperintense signal in the periventricular white matter, likely the
sequela of mild chronic small vessel ischemic disease.

Vascular: Normal arterial flow voids.

Skull and upper cervical spine: Normal marrow signal.

Sinuses/Orbits: Filling of the left sphenoid sinus, likely chronic.
Status post bilateral lens replacements.

Other: The mastoids are well aerated.
IMPRESSION: 1. No acute infarct or other acute intracranial process.
2. Foci of hemosiderin deposition in the left-greater-than-right
which may represent hypertensive microhemorrhages or multiple small
cavernomas, with an adjacent developmental venous anomaly. No
significant edema in this area to suggest recent hemorrhage.

## 2021-09-14 IMAGING — CR DG CHEST 2V
2 series · 2 of 2 positions shown · non-contrast
Comparison: Previous studies including the chest radiograph done on
[DATE]

CLINICAL DATA: Altered mental status

EXAM:
CHEST - 2 VIEW

[chest lat]
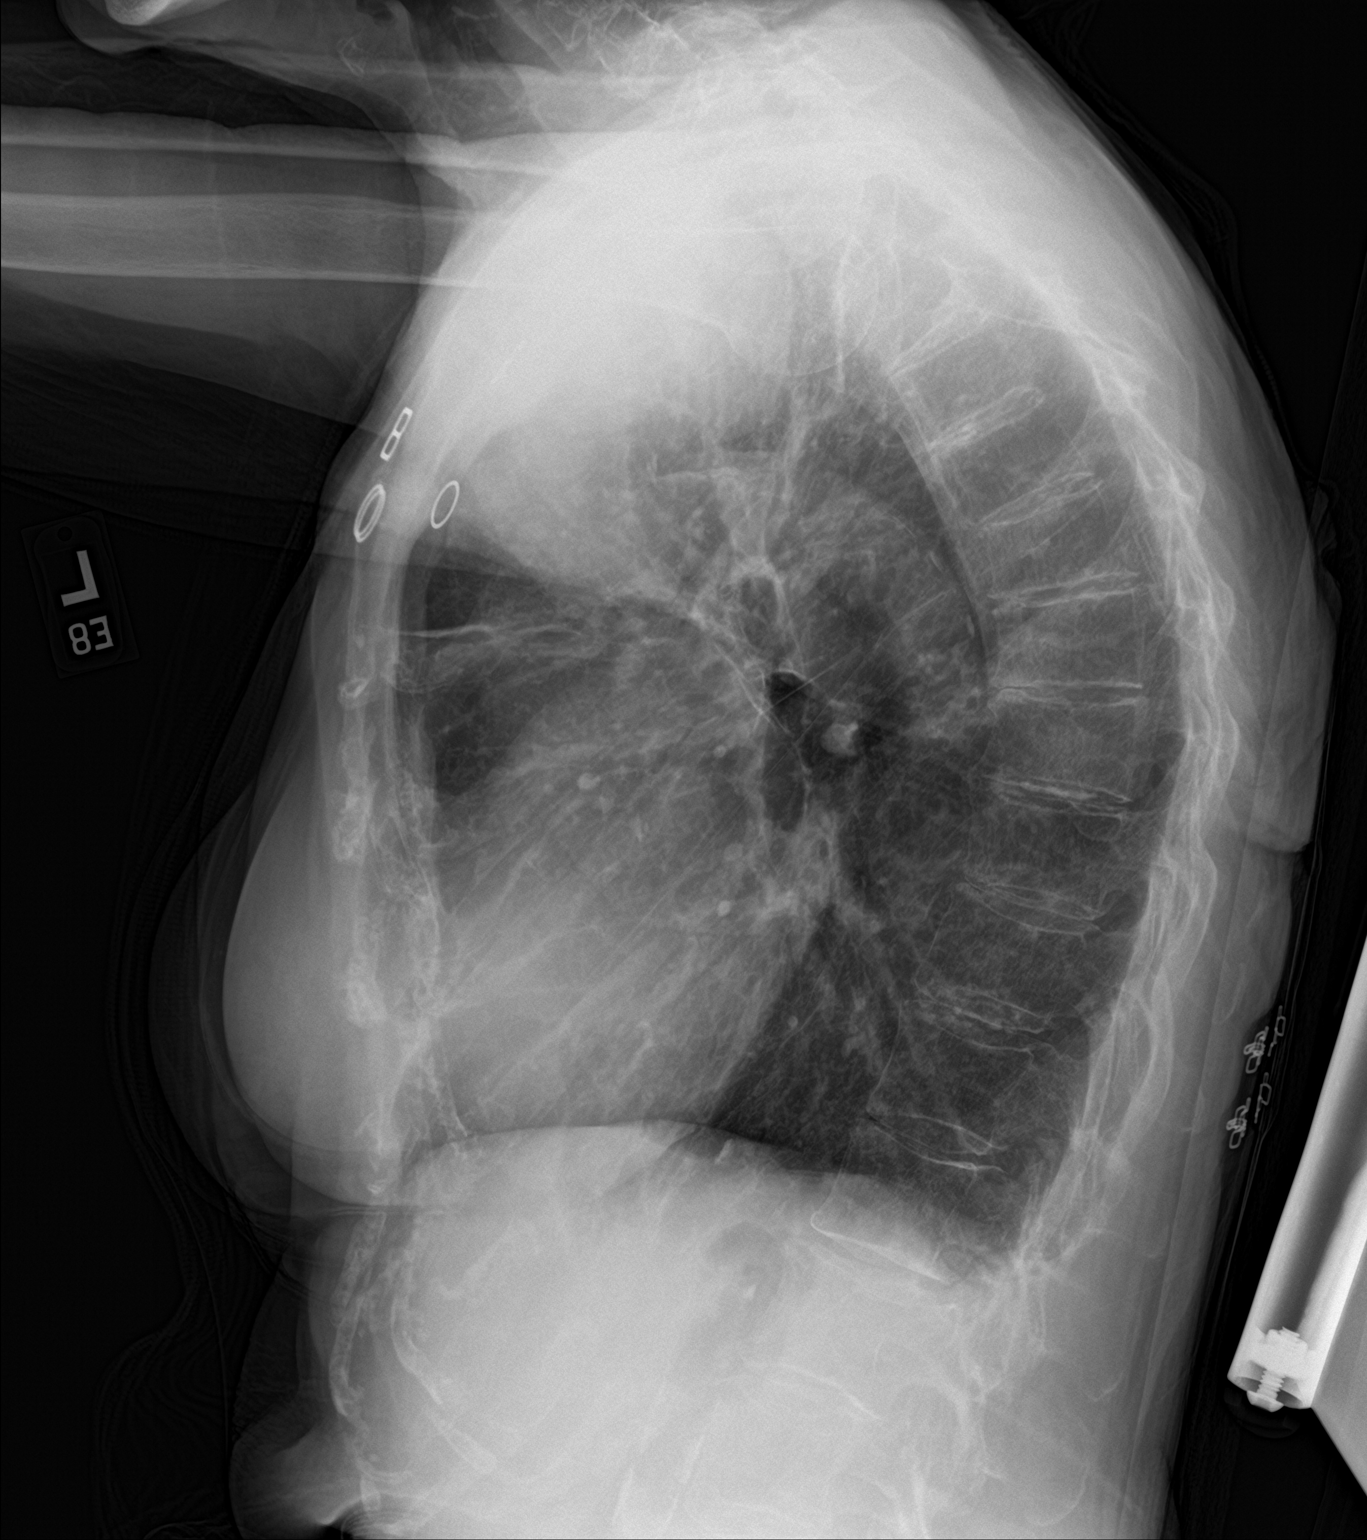

[chest ap]
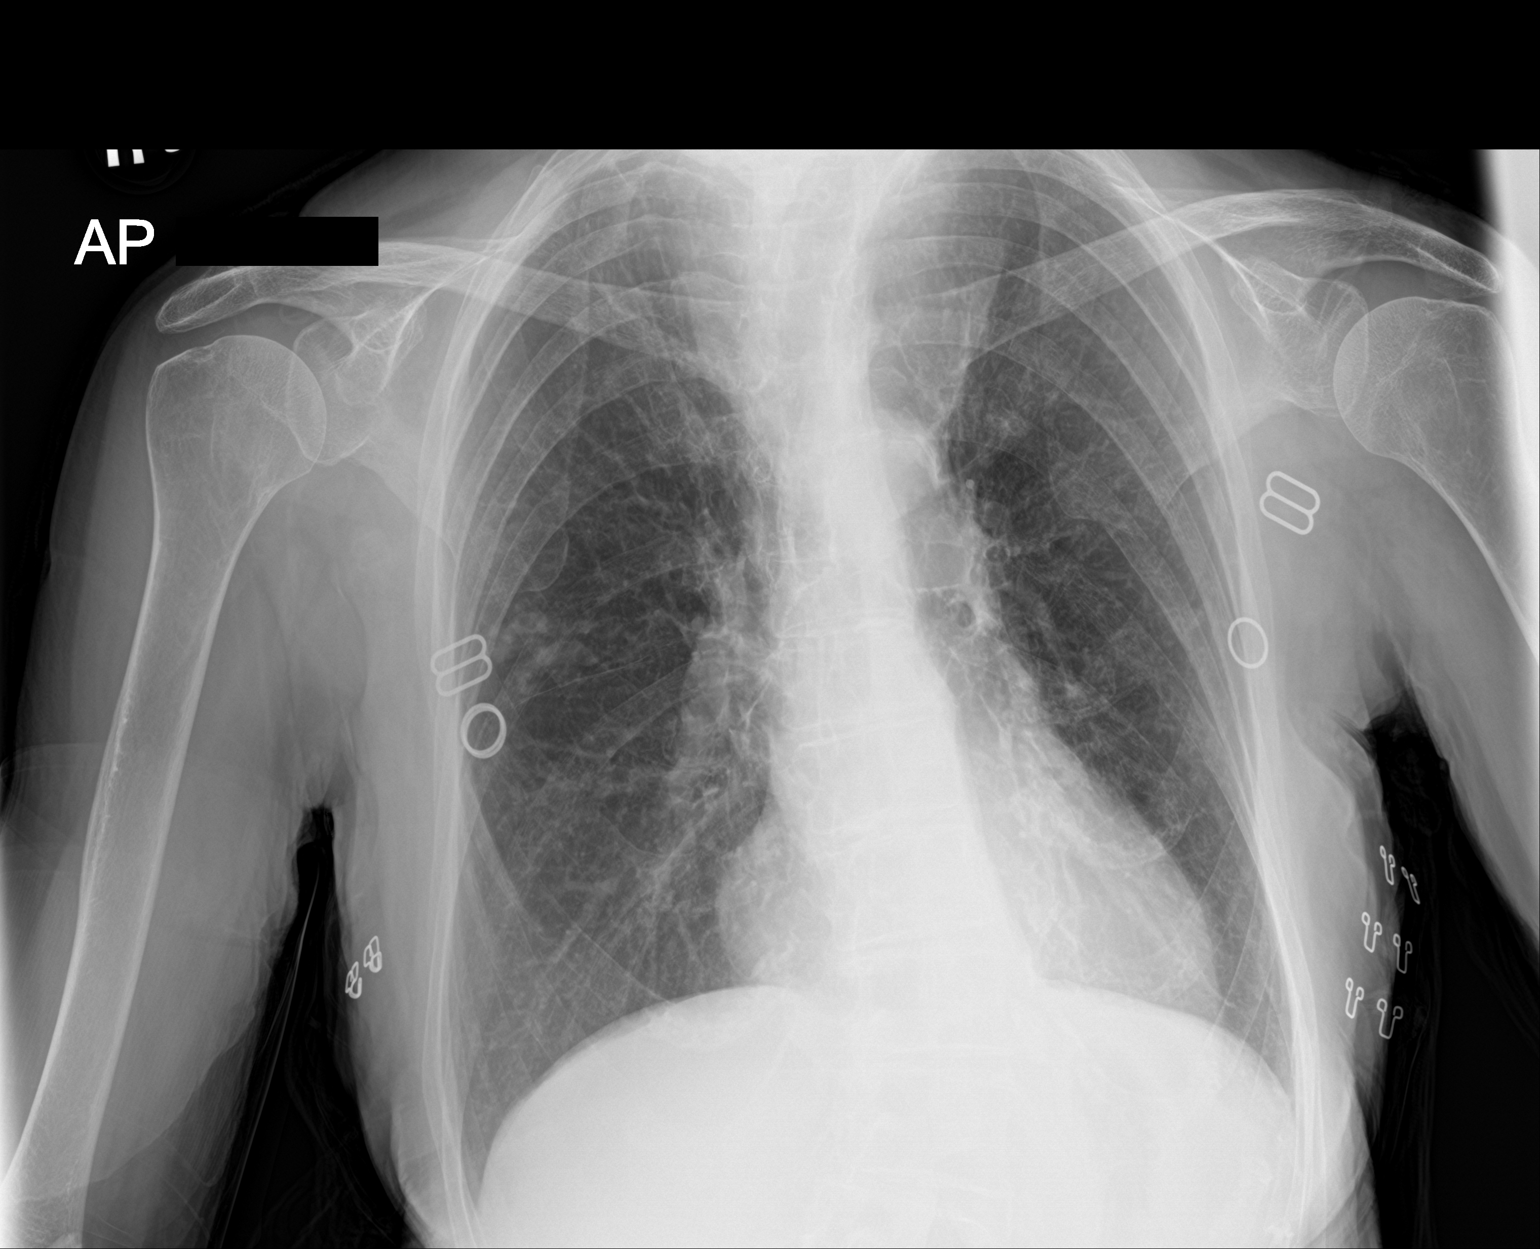

[2 of 2 positions shown; findings below may reference images not displayed]

FINDINGS: Cardiac size is within normal limits. There are no signs of
pulmonary edema or focal pulmonary consolidation. Increase in AP
diameter of chest suggests COPD. Linear densities in the anterior
mid lung fields seen in the lateral view may suggest minimal
scarring or subsegmental atelectasis. In the AP view, there are few
small linear densities immediately above the minor fissure in the
right mid lung fields. There is prominence of soft tissues in the
paratracheal region in the lower neck, possibly suggesting enlarged
thyroid, more so on the left side.
IMPRESSION: There are no new infiltrates or signs of pulmonary edema. COPD.
Linear densities in the anterior mid lung fields seen in the lateral
view may suggest scarring or subsegmental atelectasis.

## 2021-09-14 MED ORDER — HYDRALAZINE HCL 20 MG/ML IJ SOLN
10.0000 mg | Freq: Once | INTRAMUSCULAR | Status: AC
Start: 2021-09-14 — End: 2021-09-14
  Administered 2021-09-14: 10 mg via INTRAVENOUS
  Filled 2021-09-14: qty 1

## 2021-09-14 MED ORDER — SOTALOL HCL 80 MG PO TABS
80.0000 mg | ORAL_TABLET | Freq: Once | ORAL | Status: DC
  Filled 2021-09-14: qty 1

## 2021-09-14 MED ORDER — AMLODIPINE BESYLATE 5 MG PO TABS: 5.0000 mg | ORAL_TABLET | Freq: Every day | ORAL | 0 refills | Status: DC

## 2021-09-14 NOTE — Discharge Instructions (Signed)
Your symptoms may be due to elevated blood pressure.  Please follow up closely with your doctor in the next 24 hrs for recheck and blood pressure and medication management. Avoid all anti-inflammatory medications (aspirin, advil, aleve, etc...) as it may increase risk of brain bleed.  Return to the ER promptly if you have any concerns.

## 2021-09-14 NOTE — ED Notes (Signed)
Pt daughter has approached the desk multiple times statign that her mother is ready to leave - mother is still altered - pt daughter is the mothers ride - MD Dalene Seltzer to be notified

## 2021-09-14 NOTE — ED Notes (Signed)
Patient ambulated to restroom with assistance of family member.

## 2021-09-14 NOTE — ED Notes (Signed)
Dr. Dalene Seltzer speaking to daughter -- daughter still stating that pt wants to leave AMA and refusing treatment

## 2021-09-14 NOTE — ED Notes (Signed)
Pt ambulated to bathroom with stand by assist.  

## 2021-09-14 NOTE — ED Provider Triage Note (Signed)
Emergency Medicine Provider Triage Evaluation Note  Linda [name] , a 88 y.o. female  was evaluated in triage.  Pt complains of confusion.  Patient was seen as a code stroke last night and evaluated with labs and imaging.  Noted to have elevated blood pressures which were treated overnight and she was discharged early this morning.  Patient declined admission at that time as she was feeling better and work-up was reassuring.  Per daughter, she went home and took a nap.  She woke around 10 AM and felt at generally unwell.  Patient is unable to give details about how she feels, she just states that she feels bad.  She was confused all over again, cannot operate a telephone.  Symptoms did not really improve so they decided to return to the hospital.  Review of Systems  Positive: Confusion Negative: Fever  Physical Exam  BP (!) 151/60 (BP Location: Right Arm)    Pulse 74    Temp 98.4 F (36.9 C) (Oral)    Resp 18    SpO2 95%  Gen:   Awake, no distress   Resp:  Normal effort  MSK:   Moves extremities without difficulty  Other:    Medical Decision Making  Medically screening exam initiated at 2:41 PM.  Appropriate orders placed.  Linda [name] was informed that the remainder of the evaluation will be completed by another provider, this initial triage assessment does not replace that evaluation, and the importance of remaining in the ED until their evaluation is complete.     Carlisle Cater, PA-C 09/14/21 1504

## 2021-09-14 NOTE — ED Notes (Signed)
Per MD Schlossman pt does not need to sign AMA papers

## 2021-09-14 NOTE — ED Provider Notes (Signed)
[facility] Provider Note   CSN: [id number] Arrival date & time: 09/14/21  1325     History  Chief Complaint  Patient presents with   Altered Mental Status    Linda [name] is a 88 y.o. female.  HPI     88 year old female with a history of COPD, hypertension, hyperlipidemia, evaluation last night to this morning for acute confusion diagnosed as possible hypertensive and left AMA who presents with recurrent episodes of difficulty speaking.    Yesterday she had an episode of confusion, difficulty walking beginning hours before, and a code stroke was initiated.  MRI brain was completed and without acute abnormalities.  They had discussed possible admission for blood pressure management, possible hypertensive encephalopathy, however her symptoms had improved and she had chosen to be discharged.  Daughter reports that her blood pressures on arrival today had improved.  She is not sure what her blood pressures were at the time that she was having symptoms today.  Reports that she had improved earlier this morning, however throughout the day has had waxing and waning symptoms of difficulty talking.  Reports that she will stop and say "I do not know what is wrong" and will seem to have problems finding her words.  Reports that this occurs for a period of time, and then improves.  Difficult to say how long these episodes last.  She does not have any other numbness, weakness, visual changes, difficulty walking, facial droop.  Denies headache, chest pain, shortness of breath, nausea, vomiting, diarrhea, black bloody stools, urinary symptoms, fever.  Has chronic cough at baseline due to bronchiectasis which has been slowly progressing over time, but no acute worsening in the last couple of days.  No new medications  Past Medical History:  Diagnosis Date   Arthritis    COPD (chronic obstructive pulmonary disease) (HCC)    chronic cough   Glaucoma     Hyperlipidemia    Hypertension    Irregular heartbeat    Osteoporosis    Thyroid disease    enlarged thyroid     Home Medications Prior to Admission medications   Medication Sig Start Date End Date Taking? Authorizing Provider  amLODipine (NORVASC) 5 MG tablet Take 1 tablet (5 mg total) by mouth daily. 09/14/21  Yes Gareth Morgan, MD  Cholecalciferol (VITAMIN D3) 25 MCG (1000 UT) CAPS Take 1 capsule by mouth daily.    [provider]  clobetasol cream (TEMOVATE) [id number] % Apply 1 application topically as needed. 08/16/21   Vivi Barrack, MD  famotidine (PEPCID) 20 MG tablet Take 1 tablet (20 mg total) by mouth 2 (two) times daily. 03/09/21   Vivi Barrack, MD  FLUAD 0.5 ML SUSY  04/24/18   [provider]  fluticasone (FLONASE) 50 MCG/ACT nasal spray USE 1 SPRAY IN EACH NOSTRIL DAILY AS NEEDED FOR ALLERGIES OR RHINITIS 07/27/20   Orma Flaming, MD  rosuvastatin (CRESTOR) 20 MG tablet Take 1 tablet (20 mg total) by mouth daily. 03/09/21   Vivi Barrack, MD  sotalol (BETAPACE) 80 MG tablet Take 1 tablet (80 mg total) by mouth every 12 (twelve) hours. 05/25/21   Vivi Barrack, MD  timolol (TIMOPTIC) 0.25 % ophthalmic solution  02/22/19   [provider]      Allergies    Patient has no known allergies.    Review of Systems   Review of Systems See above Physical Exam Updated Vital Signs BP (!) 172/60  Pulse 78    Temp 98.4 F (36.9 C) (Oral)    Resp 16    SpO2 98%  Physical Exam Vitals and nursing note reviewed.  Constitutional:      General: She is not in acute distress.    Appearance: Normal appearance. She is well-developed. She is not ill-appearing or diaphoretic.  HENT:     Head: Normocephalic and atraumatic.  Eyes:     General: No visual field deficit.    Extraocular Movements: Extraocular movements intact.     Conjunctiva/sclera: Conjunctivae normal.     Pupils: Pupils are equal, round, and reactive to light.  Cardiovascular:     Rate and  Rhythm: Normal rate and regular rhythm.     Pulses: Normal pulses.     Heart sounds: Normal heart sounds. No murmur heard.   No friction rub. No gallop.  Pulmonary:     Effort: Pulmonary effort is normal. No respiratory distress.     Breath sounds: Normal breath sounds. No wheezing or rales.  Abdominal:     General: There is no distension.     Palpations: Abdomen is soft.     Tenderness: There is no abdominal tenderness. There is no guarding.  Musculoskeletal:        General: No swelling or tenderness.     Cervical back: Normal range of motion.  Skin:    General: Skin is warm and dry.     Findings: No erythema or rash.  Neurological:     General: No focal deficit present.     Mental Status: She is alert and oriented to person, place, and time.     GCS: GCS eye subscore is 4. GCS verbal subscore is 5. GCS motor subscore is 6.     Cranial Nerves: No cranial nerve deficit, dysarthria or facial asymmetry.     Sensory: No sensory deficit.     Motor: No weakness or tremor.     Coordination: Coordination normal. Finger-Nose-Finger Test normal.     Gait: Gait normal.    ED Results / Procedures / Treatments   Labs (all labs ordered are listed, but only abnormal results are displayed) Labs Reviewed  BASIC METABOLIC PANEL - Abnormal; Notable for the following components:      Result Value   Sodium 133 (*)    Glucose, Bld 120 (*)    All other components within normal limits  CBC WITH DIFFERENTIAL/PLATELET  TSH  TROPONIN I (HIGH SENSITIVITY)    EKG None  Radiology DG Chest 2 View  Result Date: 09/14/2021 CLINICAL DATA:  Altered mental status EXAM: CHEST - 2 VIEW COMPARISON:  Previous studies including the chest radiograph done on 02/03/2019 FINDINGS: Cardiac size is within normal limits. There are no signs of pulmonary edema or focal pulmonary consolidation. Increase in AP diameter of chest suggests COPD. Linear densities in the anterior mid lung fields seen in the lateral view may  suggest minimal scarring or subsegmental atelectasis. In the AP view, there are few small linear densities immediately above the minor fissure in the right mid lung fields. There is prominence of soft tissues in the paratracheal region in the lower neck, possibly suggesting enlarged thyroid, more so on the left side. IMPRESSION: There are no new infiltrates or signs of pulmonary edema. COPD. Linear densities in the anterior mid lung fields seen in the lateral view may suggest scarring or subsegmental atelectasis. Electronically Signed   By: Elmer Picker M.D.   On: 09/14/2021 16:29   MR BRAIN  WO CONTRAST  Result Date: 09/14/2021 CLINICAL DATA:  Stroke suspected, confusion, difficulty walking EXAM: MRI HEAD WITHOUT CONTRAST TECHNIQUE: Multiplanar, multiecho pulse sequences of the brain and surrounding structures were obtained without intravenous contrast. COMPARISON:  No prior MRI, correlation made with CT head 09/13/2021 FINDINGS: Brain: No restricted diffusion to suggest acute or subacute infarct. No acute hemorrhage, mass, mass effect, or midline shift. No hydrocephalus or extra-axial collection. Foci of hemosiderin deposition in the left cerebellum (series 14, image 16), adjacent to a developmental venous anomaly, which may represent cavernous malformations without edema to suggest recent hemorrhage. Minimal T2 hyperintense signal in the periventricular white matter, likely the sequela of mild chronic small vessel ischemic disease. Vascular: Normal arterial flow voids. Skull and upper cervical spine: Normal marrow signal. Sinuses/Orbits: Filling of the left sphenoid sinus, likely chronic. Status post bilateral lens replacements. Other: The mastoids are well aerated. IMPRESSION: 1. No acute infarct or other acute intracranial process. 2. Foci of hemosiderin deposition in the left-greater-than-right which may represent hypertensive microhemorrhages or multiple small cavernomas, with an adjacent  developmental venous anomaly. No significant edema in this area to suggest recent hemorrhage. Electronically Signed   By: Merilyn Baba M.D.   On: 09/14/2021 03:42   CT HEAD CODE STROKE WO CONTRAST  Result Date: 09/13/2021 CLINICAL DATA:  Code stroke. EXAM: CT HEAD WITHOUT CONTRAST TECHNIQUE: Contiguous axial images were obtained from the base of the skull through the vertex without intravenous contrast. RADIATION DOSE REDUCTION: This exam was performed according to the departmental dose-optimization program which includes automated exposure control, adjustment of the mA and/or kV according to patient size and/or use of iterative reconstruction technique. COMPARISON:  None. FINDINGS: Brain: No evidence of acute infarction, hemorrhage, cerebral edema, mass, mass effect, or midline shift. No hydrocephalus. Left posterior fossa arachnoid cyst or chronic hygroma. Mild periventricular white matter changes, likely the sequela of chronic small vessel ischemic disease. Degree of cerebral volume loss is within normal limits for age. Vascular: No hyperdense vessel or unexpected calcification. Skull: Normal. Negative for fracture or focal lesion. Sinuses/Orbits: No acute finding. Other: The mastoid air cells are well aerated. ASPECTS [facility] Stroke Program Early CT Score) - Ganglionic level infarction (caudate, lentiform nuclei, internal capsule, insula, M1-M3 cortex): 7 - Supraganglionic infarction (M4-M6 cortex): 3 Total score (0-10 with 10 being normal): 10 IMPRESSION: 1. No acute intracranial process. 2. ASPECTS is 10 Code stroke imaging results were communicated on 09/13/2021 at 11:28 pm to provider Dr. Lorrin Goodell via secure text paging. Electronically Signed   By: Merilyn Baba M.D.   On: 09/13/2021 23:29    Procedures Procedures    Medications Ordered in ED Medications - No data to display  ED Course/ Medical Decision Making/ A&P                           Medical Decision Making Amount and/or Complexity  of Data Reviewed Labs: ordered. Radiology: ordered.  Risk Prescription drug management.    88 year old female with a history of COPD, hypertension, hyperlipidemia, evaluation last night to this morning for acute confusion diagnosed as possible hypertensive and left AMA who presents with recurrent episodes of difficulty speaking.    Again on arrival to the emergency department she has improved, with normal neurologic exam, no signs of speech problems currently.  History is concerning for possible TIA or seizures with postictal period.  Discussed recommendation to obtain CTA, neurology consult, and neurology is also recommending continuous EEG monitoring.  Labs were obtained and personally reviewed and interpreted by me show no significant electrolyte abnormalities, normal troponin, no anemia or leukocytosis.  Discussed with neurology plan for admission for further work-up (?stuttering TIA/seizures) however Linda [name] reports she does not want to stay.  Tried to convince her multiple times to stay in the ED and discussed risks of CVA, death, disability and she continues to desire to leavel AMA. She has capacity at this time to make this decision. Discussed with daughter who also reports her mother took care of her father who had a stroke and understands the risks.  Given referral for Neurology, rx for amlodipine for bp, recommendation for PCP follow up .          Final Clinical Impression(s) / ED Diagnoses Final diagnoses:  Difficulty with speech  TIA (transient ischemic attack)    Rx / DC Orders ED Discharge Orders          Ordered    Ambulatory referral to Neurology       Comments: An appointment is requested in approximately: 1 week   09/14/21 2053    amLODipine (NORVASC) 5 MG tablet  Daily        09/14/21 2054              Gareth Morgan, MD 09/15/21 [phone]

## 2021-09-14 NOTE — ED Notes (Signed)
Neurologist speaking to daughter at this time

## 2021-09-14 NOTE — Consult Note (Signed)
NEUROLOGY CONSULTATION NOTE   Date of service: September 14, 2021 Patient Name: Linda [name] MRN:  [id number] DOB:  10-17-1923 Reason for consult: "stroke code for concern for aphasia" Requesting Provider: Valarie Merino, MD _ _ _   _ __   _ __ _ _  __ __   _ __   __ _  History of Present Illness  Linda [name] is a 88 y.o. female with PMH significant for COPD, bronchiectasis, glaucoma, hyperlipidemia, hypertension, irregular heartbeat on sotalol but no diagnosis of atrial fibrillation, osteoporosis, hypothyroidism who presents with confusion that has resolved.  Patient was on the phone with her daughter when she reports feeling confused and that she could not think straight.  Daughter reports that patient was having difficulty forming her thoughts towards the end of the conversation. Patient hung up. Sat down and walked around and felt better. She was having trouble reconnecting to the daughter. Daughter called staff at her independent living faciltiy who checked in on patient and was reportedly able to talk to them but seemed confused. EMS called and she was transferred to ED.  A stroke code was activated on arrival for potential concern for a stroke. But patient did not have any focal deficit, no word finding difficulty. CTH w/o contrast was negative. NIHSS of 1 for left leg weakness but this is chronic and patient attributes this to a torn L leg meniscus for which she gets steroid shots.  Patient reports recent shingles shot on Saturday, felt a little tired afterwards. Elevated BP here with systolic of over [id number]. Per daughter PCP worried about precipitating a fall with antihypertensives. Typical SBP rusn in 140s-160s.  LKW: 2040 09/13/21 mRS: 0 tNKASE: not offered, resolution of symptoms. Thrombectomy: not offered, resolution of symptoms. NIHSS components Score: Comment  1a Level of Conscious 0[]  1[]  2[]  3[]      1b LOC Questions 0[]  1[]  2[]       1c LOC Commands 0[]  1[]  2[]       2 Best  Gaze 0[]  1[]  2[]       3 Visual 0[]  1[]  2[]  3[]      4 Facial Palsy 0[]  1[]  2[]  3[]      5a Motor Arm - left 0[]  1[]  2[]  3[]  4[]  UN[]    5b Motor Arm - Right 0[]  1[]  2[]  3[]  4[]  UN[]    6a Motor Leg - Left 0[]  1[]  2[]  3[]  4[]  UN[]    6b Motor Leg - Right 0[]  1[]  2[]  3[]  4[]  UN[]    7 Limb Ataxia 0[]  1[]  2[]  3[]  UN[]     8 Sensory 0[]  1[]  2[]  UN[]      9 Best Language 0[]  1[]  2[]  3[]      10 Dysarthria 0[]  1[]  2[]  UN[]      11 Extinct. and Inattention 0[]  1[]  2[]       TOTAL:       ROS   Constitutional Denies weight loss, fever and chills.   HEENT Denies changes in vision and hearing.   Respiratory Denies SOB and cough.   CV Denies palpitations and CP   GI Denies abdominal pain, nausea, vomiting and diarrhea.   GU Denies dysuria and urinary frequency.   MSK Denies myalgia and joint pain.   Skin Denies rash and pruritus.   Neurological Denies headache and syncope.   Psychiatric Denies recent changes in mood. Denies anxiety and depression.    Past History   Past Medical History:  Diagnosis Date   Arthritis    COPD (chronic obstructive pulmonary disease) ([place])  chronic cough   Glaucoma    Hyperlipidemia    Hypertension    Irregular heartbeat    Osteoporosis    Thyroid disease    enlarged thyroid   Past Surgical History:  Procedure Laterality Date   APPENDECTOMY     cateract surgery     COLONOSCOPY     Done in [place]  yrs ago   UPPER GASTROINTESTINAL ENDOSCOPY  2019   WRIST SURGERY     Family History  Problem Relation Age of Onset   Heart disease Mother    Heart disease Father    Heart disease Brother    Heart disease Brother    Colon cancer Neg Hx    Colon polyps Neg Hx    Esophageal cancer Neg Hx    Social History   Socioeconomic History   Marital status: Widowed    Spouse name: Not on file   Number of children: Not on file   Years of education: Not on file   Highest education level: Not on file  Occupational History   Occupation: Retired  Tobacco Use   Smoking  status: Former    Packs/day: 0.25    Years: 2.00    Pack years: 0.50    Types: Cigarettes    Start date: 1950    Quit date: 1952    Years since quitting: 71.1   Smokeless tobacco: Never  Vaping Use   Vaping Use: Never used  Substance and Sexual Activity   Alcohol use: Not Currently   Drug use: Never   Sexual activity: Not on file  Other Topics Concern   Not on file  Social History Narrative   Resident at [employer]    Enjoys playing cards    Social Determinants of Health   Financial Resource Strain: Low Risk    Difficulty of Paying Living Expenses: Not hard at all  Food Insecurity: No Food Insecurity   Worried About Charity fundraiser in the Last Year: Never true   Arboriculturist in the Last Year: Never true  Transportation Needs: No Transportation Needs   Lack of Transportation (Medical): No   Lack of Transportation (Non-Medical): No  Physical Activity: Inactive   Days of Exercise per Week: 0 days   Minutes of Exercise per Session: 0 min  Stress: No Stress Concern Present   Feeling of Stress : Only a little  Social Connections: Socially Isolated   Frequency of Communication with Friends and Family: Once a week   Frequency of Social Gatherings with Friends and Family: More than three times a week   Attends Religious Services: Never   Marine scientist or Organizations: No   Attends Archivist Meetings: Never   Marital Status: Widowed   No Known Allergies  Medications  (Not in a hospital admission)    Vitals   Vitals:   09/13/21 2233 09/13/21 2315  BP: (!) 217/74   Pulse: 75   Resp: 16   Temp: 98.1 F (36.7 C)   TempSrc: Oral   SpO2: 97%   Weight:  57.2 kg     Body mass index is 20.97 kg/m.  Physical Exam   General: Laying comfortably in bed; in no acute distress.  HENT: Normal oropharynx and mucosa. Normal external appearance of ears and nose.  Neck: Supple, no pain or tenderness  CV: No JVD. No peripheral edema.   Pulmonary: Symmetric Chest rise. Normal respiratory effort.  Abdomen: Soft to touch, non-tender.  Ext: No  cyanosis, edema, or deformity  Skin: No rash. Normal palpation of skin.   Musculoskeletal: Normal digits and nails by inspection. No clubbing.   Neurologic Examination  Mental status/Cognition: Alert, oriented to self, place, month and year, good attention.  Speech/language: Fluent, comprehension intact, object naming intact, repetition intact.  Cranial nerves:   CN II Pupils equal and reactive to light, no VF deficits    CN III,IV,VI EOM intact, no gaze preference or deviation, no nystagmus    CN V normal sensation in V1, V2, and V3 segments bilaterally    CN VII no asymmetry, no nasolabial fold flattening    CN VIII normal hearing to speech    CN IX & X normal palatal elevation, no uvular deviation    CN XI 5/5 head turn and 5/5 shoulder shrug bilaterally    CN XII midline tongue protrusion    Motor:  Muscle bulk: poor, tone normal, pronator drift none tremor none Mvmt Root Nerve  Muscle Right Left Comments  SA C5/6 Ax Deltoid     EF C5/6 Mc Biceps 5 5   EE C6/7/8 Rad Triceps 5 5   WF C6/7 Med FCR     WE C7/8 PIN ECU     F Ab C8/T1 U ADM/FDI 5 5   HF L1/2/3 Fem Illopsoas 4+ 4 L knee pain that prevents her from exerting full strength.  KE L2/3/4 Fem Quad 5    DF L4/5 D Peron Tib Ant 5 5   PF S1/2 Tibial Grc/Sol 5 5    Reflexes:  Right Left Comments  Pectoralis      Biceps (C5/6) 2 2   Brachioradialis (C5/6) 2 2    Triceps (C6/7) 2 2    Patellar (L3/4) 2 -  Deferred 2/2 L knee pain   Achilles (S1)      Hoffman      Plantar     Jaw jerk    Sensation:  Light touch    Pin prick    Temperature    Vibration   Proprioception    Coordination/Complex Motor:  - Finger to Nose intact BL - Heel to shin unable to do due to pain but no obvious ataxia. - Rapid alternating movement are slowed thoughout - Gait: Stride length short with mildly antaglic gait. Arm swing  poor. Base width narrow.  Labs   CBC:  Recent Labs  Lab 09/13/21 2317 09/13/21 2322  WBC 8.9  --   NEUTROABS 6.7  --   HGB 14.6 15.3*  HCT 44.5 45.0  MCV 98.9  --   PLT 204  --     Basic Metabolic Panel:  Lab Results  Component Value Date   NA 136 09/13/2021   K 4.1 09/13/2021   CO2 23 09/13/2021   GLUCOSE 126 (H) 09/13/2021   BUN 13 09/13/2021   CREATININE 0.60 09/13/2021   CALCIUM 10.5 (H) 09/13/2021   GFRNONAA >60 09/13/2021   GFRAA 89 06/23/2020   Lipid Panel:  Lab Results  Component Value Date   LDLCALC 77 06/23/2020   HgbA1c: No results found for: HGBA1C Urine Drug Screen: No results found for: LABOPIA, COCAINSCRNUR, LABBENZ, AMPHETMU, THCU, LABBARB  Alcohol Level     Component Value Date/Time   ETH <10 09/13/2021 2317    CT Head without contrast(Personally reviewed): CTH was negative for a large hypodensity concerning for a large territory infarct or hyperdensity concerning for an ICH  MRI Brain: pending Impression   Linda [name] is a 88 y.o.  female with PMH significant for COPD, bronchiectasis, glaucoma, hyperlipidemia, hypertension, irregular heartbeat on sotalol but no diagnosis of atrial fibrillation, osteoporosis, hypothyroidism who presents with confusion that has resolved. Her neurologic examination is notable for no focal deficit, specifically no aphasia. Very low suspicion for this being stroke. Clinical history obtained from both patient as well as daughter seems to be more suggestive of encephalopathy "brain fog" rather than isolated aphasia. Could possibly be related to her hypertension or other metabolic abnormality.  Stroke code was cancelled after discussion with patient, her daughter and with the ED team.  Recommendations  - Recommend metabolic workup with CBC, chemistry, UA, CXR. - MRI Brain without contrast. No further workup if MRI Brain is negative. _____________________________________________________________________  Thank you  for the opportunity to take part in the care of this patient. If you have any further questions, please contact the neurology consultation attending.  Signed,  Rayle Pager Number [phone] _ _ _   _ __   _ __ _ _  __ __   _ __   __ _

## 2021-09-14 NOTE — ED Triage Notes (Signed)
Patient BIB family for altered mental status, patient discharged this morning after resolution of similar symptoms. Patient alert, disoriented, and in no apparent distress at this time.

## 2021-09-14 NOTE — ED Notes (Signed)
This RN went to go over paperwork with pt and family and found that they had left - pt to be DC out of system

## 2021-09-15 ENCOUNTER — Other Ambulatory Visit: Payer: Self-pay

## 2021-09-15 ENCOUNTER — Ambulatory Visit (INDEPENDENT_AMBULATORY_CARE_PROVIDER_SITE_OTHER): Payer: Medicare Other | Admitting: Family Medicine

## 2021-09-15 ENCOUNTER — Encounter: Payer: Self-pay | Admitting: Family Medicine

## 2021-09-15 ENCOUNTER — Encounter: Payer: Self-pay | Admitting: Neurology

## 2021-09-15 VITALS — BP 140/64 | HR 80 | Temp 97.4°F | Ht 65.0 in | Wt 124.8 lb

## 2021-09-15 DIAGNOSIS — E213 Hyperparathyroidism, unspecified: Secondary | ICD-10-CM

## 2021-09-15 DIAGNOSIS — I10 Essential (primary) hypertension: Secondary | ICD-10-CM

## 2021-09-15 DIAGNOSIS — R197 Diarrhea, unspecified: Secondary | ICD-10-CM | POA: Diagnosis not present

## 2021-09-15 DIAGNOSIS — R4701 Aphasia: Secondary | ICD-10-CM | POA: Diagnosis not present

## 2021-09-15 DIAGNOSIS — I351 Nonrheumatic aortic (valve) insufficiency: Secondary | ICD-10-CM

## 2021-09-15 DIAGNOSIS — R350 Frequency of micturition: Secondary | ICD-10-CM

## 2021-09-15 DIAGNOSIS — R202 Paresthesia of skin: Secondary | ICD-10-CM | POA: Diagnosis not present

## 2021-09-15 NOTE — Patient Instructions (Signed)
It was very nice to see you today!  I think you had a TIA. We will check an ultrasound of the arteries in your neck and an ultrasound of your heart.  We will also check urine sample and blood work today to rule out any other possible causes.  Please keep an eye on your blood pressure and let us know if you have any other significantly elevated readings.  Please let us know if you have any recurrence of symptoms.  Take care, Dr Jerline Pain  PLEASE NOTE:  If you had any lab tests please let us know if you have not heard back within a few days. You may see your results on mychart before we have a chance to review them but we will give you a call once they are reviewed by [place]. If we ordered any referrals today, please let us know if you have not heard from their office within the next week.   Please try these tips to maintain a healthy lifestyle:  Eat at least 3 REAL meals and 1-2 snacks per day.  Aim for no more than 5 hours between eating.  If you eat breakfast, please do so within one hour of getting up.   Each meal should contain half fruits/vegetables, one quarter protein, and one quarter carbs (no bigger than a computer mouse)  Cut down on sweet beverages. This includes juice, soda, and sweet tea.   Drink at least 1 glass of water with each meal and aim for at least 8 glasses per day  Exercise at least 150 minutes every week.

## 2021-09-15 NOTE — Progress Notes (Signed)
Linda [name] is a 88 y.o. female who presents today for an office visit.  Assessment/Plan:  New/Acute Problems: Aphasia Reassuring neuro exam today.  It is possible this could have represented a TIA.  She has been referred to neurology but does not have an appointment with them for another couple of months.  We will start TIA work-up with carotid Dopplers and echocardiogram.  She is already received an MRI which did not show any signs of acute stroke.  She is already on statin.  We will also check urine culture to rule out UTI as well as check additional labs including folate, B12, CBC, c-Met, and TSH.  We also had a lengthy discussion regarding her blood pressure control.  Her blood pressure is at goal today at 140/64 without any antihypertensives.  She did have a recurrence of symptoms yesterday and at that time her blood pressure was in the 160s over 100s - it is possible though not likely that her symptoms were related to hypertensive encephalopathy.  We discussed starting amlodipine versus continue with watchful waiting.  Patient and daughter both agreeable to continue with watchful waiting.  They will let me know if she has any elevated blood pressure readings.  Diarrhea No red flags.  Reassuring exam.  We will continue with watchful waiting.  She can use Imodium and increase fiber intake.  Discussed reasons to return to care.  Chronic Problems Addressed Today: Hyperparathyroidism ([place]) We will check calcium to be met.  Aortic cusp regurgitation Recheck echocardiogram.  Essential hypertension Had a lengthy discussion with patient and her daughter today regarding her blood pressure control.  She has occasionally had isolated systolic hypertension though typically her readings are in the 140s over 70s to 80s.  Her blood pressure is at goal today at 140/64.  She does have some unsteadiness and is concerned about syncopal events.  We discussed risks versus benefits of starting  antihypertensives.  Patient and daughter agreed to hold off on adding on additional antihypertensives at this point.  As above it is possible though less likely that her aphasic episodes were related to her hypertension.  When EMS was called to her independently in facility blood pressure was in the 160s over 100s.     Subjective:  HPI:  Patient here for ED follow up. She presented to the ED on 09/13/2021 with confusion and difficulty walking.  EMS was called to independent living facility.  On arrival to the ED code stroke was called.  Her blood pressure was 217/74 in the ED. Had work up in the ED which include MRI and CT scan.  No signs of acute stroke.  They discussed admission for hypertensive encephalopathy.  She declined to do this.  She went home AMA.  Symptomss improved during her stay in the ED.  She had a recurrence of confusion and with word finding difficulty the day afterwards.  Blood pressure was still slightly elevated though less than the day prior.  Neurology was consulted who recommended admission.  Patient left again AMA with desire to pursue further work-up as an outpatient.  She has not been doing well since being discharged. She is here with daughter today. Her daughter notes she was unable to understand things when this started. This has improved but not resolved. She is sleeping constantly. Had decreased appetite. Had some diarrhea. She notes this is watery. This started couple of days ago. She notes symptoms have been the same. She has not tried anything for diarrhea. She has  been feeling cold most of the time. This seems to get worse. She was started on Amlodipine in the ED for blood pressure. She notes she is not taking her medication. Her daughter notes she does check her blood pressure at home.No abdominal [name]. No fever or chills. Denies dysuria. No nausea or vomiting. No cramping. Have some numbness in legs.        Objective:  Physical Exam: BP 140/64 (BP Location:  Right Arm)    Pulse 80    Temp (!) 97.4 F (36.3 C) (Temporal)    Ht 5' 5"  (1.651 m)    Wt 124 lb 12.8 oz (56.6 kg)    SpO2 98%    BMI 20.77 kg/m   Gen: No acute distress, resting comfortably CV: Regular rate and rhythm with no murmurs appreciated Pulm: Normal work of breathing, clear to auscultation bilaterally with no crackles, wheezes, or rhonchi Neuro: Cranial nerves II through XII intact.  Strength 5 out of 5 in upper and lower extremities.  Sensation light touch intact throughout. Psych: Normal affect and thought content       I,Linda [name],acting as a scribe for Linda Chyle, MD.,have documented all relevant documentation on the behalf of Linda Chyle, MD,as directed by  Linda Chyle, MD while in the presence of Linda Chyle, MD.   I, Linda Chyle, MD, have reviewed all documentation for this visit. The documentation on 09/15/21 for the exam, diagnosis, procedures, and orders are all accurate and complete.   Time Spent: 50 minutes of total time was spent on the date of the encounter performing the following actions: chart review prior to seeing the patient including recent ED visits, obtaining history, performing a medically necessary exam, counseling on the treatment plan, placing orders, and documenting in our EHR.    Linda [name]. Linda Pain, MD 09/15/2021 4:30 PM

## 2021-09-15 NOTE — Assessment & Plan Note (Signed)
We will check calcium to be met.

## 2021-09-15 NOTE — Assessment & Plan Note (Addendum)
Had a lengthy discussion with patient and her daughter today regarding her blood pressure control.  She has occasionally had isolated systolic hypertension though typically her readings are in the 140s over 70s to 80s.  Her blood pressure is at goal today at 140/64.  She does have some unsteadiness and is concerned about syncopal events.  We discussed risks versus benefits of starting antihypertensives.  Patient and daughter agreed to hold off on adding on additional antihypertensives at this point.  As above it is possible though less likely that her aphasic episodes were related to her hypertension.  When EMS was called to her independently in facility blood pressure was in the 160s over 100s.

## 2021-09-15 NOTE — Assessment & Plan Note (Signed)
Recheck echocardiogram 

## 2021-09-16 LAB — COMPREHENSIVE METABOLIC PANEL
ALT: 22 U/L (ref 0–35)
AST: 38 U/L — ABNORMAL HIGH (ref 0–37)
Albumin: 4.3 g/dL (ref 3.5–5.2)
Alkaline Phosphatase: 94 U/L (ref 39–117)
BUN: 20 mg/dL (ref 6–23)
CO2: 29 mEq/L (ref 19–32)
Calcium: 10.7 mg/dL — ABNORMAL HIGH (ref 8.4–10.5)
Chloride: 94 mEq/L — ABNORMAL LOW (ref 96–112)
Creatinine, Ser: 0.75 mg/dL (ref 0.40–1.20)
GFR: 67.63 mL/min (ref >60.00)
Glucose, Bld: 108 mg/dL — ABNORMAL HIGH (ref 70–99)
Potassium: 4.3 mEq/L (ref 3.5–5.1)
Sodium: 130 mEq/L — ABNORMAL LOW (ref 135–145)
Total Bilirubin: 0.6 mg/dL (ref 0.2–1.2)
Total Protein: 7.4 g/dL (ref 6.0–8.3)

## 2021-09-16 LAB — CBC
HCT: 39.6 % (ref 36.0–46.0)
Hemoglobin: 13.2 g/dL (ref 12.0–15.0)
MCHC: 33.2 g/dL (ref 30.0–36.0)
MCV: 96.3 fl (ref 78.0–100.0)
Platelets: 252 10*3/uL (ref 150.0–400.0)
RBC: 4.11 Mil/uL (ref 3.87–5.11)
RDW: 13 % (ref 11.5–15.5)
WBC: 9 10*3/uL (ref 4.0–10.5)

## 2021-09-16 LAB — FOLATE: Folate: 18.4 ng/mL (ref >5.9)

## 2021-09-16 LAB — URINE CULTURE
MICRO NUMBER:: 12955209
Result:: NO GROWTH
SPECIMEN QUALITY:: ADEQUATE

## 2021-09-16 LAB — VITAMIN B12: Vitamin B-12: 369 pg/mL (ref 211–911)

## 2021-09-16 LAB — TSH: TSH: 1.23 u[IU]/mL (ref 0.35–5.50)

## 2021-09-19 ENCOUNTER — Encounter (HOSPITAL_COMMUNITY): Payer: Self-pay

## 2021-09-19 ENCOUNTER — Ambulatory Visit (INDEPENDENT_AMBULATORY_CARE_PROVIDER_SITE_OTHER): Payer: Medicare Other | Admitting: Physician Assistant

## 2021-09-19 ENCOUNTER — Inpatient Hospital Stay (HOSPITAL_COMMUNITY): Payer: Medicare Other

## 2021-09-19 ENCOUNTER — Emergency Department (HOSPITAL_COMMUNITY): Payer: Medicare Other

## 2021-09-19 ENCOUNTER — Encounter: Payer: Self-pay | Admitting: Physician Assistant

## 2021-09-19 ENCOUNTER — Other Ambulatory Visit: Payer: Self-pay

## 2021-09-19 ENCOUNTER — Inpatient Hospital Stay (HOSPITAL_COMMUNITY)
Admission: EM | Admit: 2021-09-19 | Discharge: 2021-09-23 | DRG: 064 | Disposition: A | Discharge status: Nursing Home (Includes ICF) | Payer: Medicare Other | Source: Skilled Nursing Facility | Attending: Internal Medicine | Admitting: Internal Medicine

## 2021-09-19 VITALS — BP 150/80 | HR 71 | Temp 97.7°F | Ht 65.0 in | Wt 124.0 lb

## 2021-09-19 DIAGNOSIS — R059 Cough, unspecified: Secondary | ICD-10-CM

## 2021-09-19 DIAGNOSIS — I6330 Cerebral infarction due to thrombosis of unspecified cerebral artery: Secondary | ICD-10-CM | POA: Insufficient documentation

## 2021-09-19 DIAGNOSIS — E876 Hypokalemia: Secondary | ICD-10-CM | POA: Diagnosis not present

## 2021-09-19 DIAGNOSIS — Z8249 Family history of ischemic heart disease and other diseases of the circulatory system: Secondary | ICD-10-CM | POA: Diagnosis not present

## 2021-09-19 DIAGNOSIS — E871 Hypo-osmolality and hyponatremia: Secondary | ICD-10-CM | POA: Diagnosis present

## 2021-09-19 DIAGNOSIS — J479 Bronchiectasis, uncomplicated: Secondary | ICD-10-CM | POA: Diagnosis present

## 2021-09-19 DIAGNOSIS — R29700 NIHSS score 0: Secondary | ICD-10-CM | POA: Diagnosis present

## 2021-09-19 DIAGNOSIS — J449 Chronic obstructive pulmonary disease, unspecified: Secondary | ICD-10-CM | POA: Diagnosis not present

## 2021-09-19 DIAGNOSIS — Z6820 Body mass index (BMI) 20.0-20.9, adult: Secondary | ICD-10-CM | POA: Diagnosis not present

## 2021-09-19 DIAGNOSIS — Z20822 Contact with and (suspected) exposure to covid-19: Secondary | ICD-10-CM | POA: Diagnosis present

## 2021-09-19 DIAGNOSIS — I110 Hypertensive heart disease with heart failure: Secondary | ICD-10-CM | POA: Diagnosis present

## 2021-09-19 DIAGNOSIS — R404 Transient alteration of awareness: Secondary | ICD-10-CM

## 2021-09-19 DIAGNOSIS — M810 Age-related osteoporosis without current pathological fracture: Secondary | ICD-10-CM | POA: Diagnosis present

## 2021-09-19 DIAGNOSIS — Z66 Do not resuscitate: Secondary | ICD-10-CM | POA: Diagnosis present

## 2021-09-19 DIAGNOSIS — R0600 Dyspnea, unspecified: Secondary | ICD-10-CM

## 2021-09-19 DIAGNOSIS — D649 Anemia, unspecified: Secondary | ICD-10-CM | POA: Diagnosis present

## 2021-09-19 DIAGNOSIS — R4182 Altered mental status, unspecified: Secondary | ICD-10-CM | POA: Diagnosis present

## 2021-09-19 DIAGNOSIS — E049 Nontoxic goiter, unspecified: Secondary | ICD-10-CM | POA: Diagnosis present

## 2021-09-19 DIAGNOSIS — R531 Weakness: Secondary | ICD-10-CM

## 2021-09-19 DIAGNOSIS — F419 Anxiety disorder, unspecified: Secondary | ICD-10-CM | POA: Diagnosis present

## 2021-09-19 DIAGNOSIS — I63412 Cerebral infarction due to embolism of left middle cerebral artery: Principal | ICD-10-CM | POA: Diagnosis present

## 2021-09-19 DIAGNOSIS — H409 Unspecified glaucoma: Secondary | ICD-10-CM | POA: Diagnosis present

## 2021-09-19 DIAGNOSIS — I471 Supraventricular tachycardia: Secondary | ICD-10-CM | POA: Diagnosis present

## 2021-09-19 DIAGNOSIS — Z79899 Other long term (current) drug therapy: Secondary | ICD-10-CM

## 2021-09-19 DIAGNOSIS — I5032 Chronic diastolic (congestive) heart failure: Secondary | ICD-10-CM | POA: Diagnosis present

## 2021-09-19 DIAGNOSIS — F32A Depression, unspecified: Secondary | ICD-10-CM | POA: Diagnosis present

## 2021-09-19 DIAGNOSIS — R051 Acute cough: Secondary | ICD-10-CM | POA: Diagnosis not present

## 2021-09-19 DIAGNOSIS — R54 Age-related physical debility: Secondary | ICD-10-CM | POA: Diagnosis present

## 2021-09-19 DIAGNOSIS — R627 Adult failure to thrive: Secondary | ICD-10-CM | POA: Diagnosis present

## 2021-09-19 DIAGNOSIS — I10 Essential (primary) hypertension: Secondary | ICD-10-CM | POA: Diagnosis present

## 2021-09-19 DIAGNOSIS — I361 Nonrheumatic tricuspid (valve) insufficiency: Secondary | ICD-10-CM | POA: Diagnosis not present

## 2021-09-19 DIAGNOSIS — K219 Gastro-esophageal reflux disease without esophagitis: Secondary | ICD-10-CM | POA: Diagnosis present

## 2021-09-19 DIAGNOSIS — Z87891 Personal history of nicotine dependence: Secondary | ICD-10-CM | POA: Diagnosis not present

## 2021-09-19 DIAGNOSIS — G9341 Metabolic encephalopathy: Secondary | ICD-10-CM | POA: Diagnosis present

## 2021-09-19 DIAGNOSIS — E785 Hyperlipidemia, unspecified: Secondary | ICD-10-CM | POA: Diagnosis present

## 2021-09-19 DIAGNOSIS — R479 Unspecified speech disturbances: Secondary | ICD-10-CM

## 2021-09-19 DIAGNOSIS — E782 Mixed hyperlipidemia: Secondary | ICD-10-CM | POA: Diagnosis not present

## 2021-09-19 DIAGNOSIS — I351 Nonrheumatic aortic (valve) insufficiency: Secondary | ICD-10-CM | POA: Diagnosis not present

## 2021-09-19 LAB — COMPREHENSIVE METABOLIC PANEL
ALT: 20 U/L (ref 0–44)
AST: 29 U/L (ref 15–41)
Albumin: 3.9 g/dL (ref 3.5–5.0)
Alkaline Phosphatase: 83 U/L (ref 38–126)
Anion gap: 10 (ref 5–15)
BUN: 7 mg/dL — ABNORMAL LOW (ref 8–23)
CO2: 23 mmol/L (ref 22–32)
Calcium: 10.1 mg/dL (ref 8.9–10.3)
Chloride: 97 mmol/L — ABNORMAL LOW (ref 98–111)
Creatinine, Ser: 0.69 mg/dL (ref 0.44–1.00)
GFR, Estimated: >60 mL/min (ref >60)
Glucose, Bld: 172 mg/dL — ABNORMAL HIGH (ref 70–99)
Potassium: 3.5 mmol/L (ref 3.5–5.1)
Sodium: 130 mmol/L — ABNORMAL LOW (ref 135–145)
Total Bilirubin: 0.5 mg/dL (ref 0.3–1.2)
Total Protein: 7.2 g/dL (ref 6.5–8.1)

## 2021-09-19 LAB — URINALYSIS, ROUTINE W REFLEX MICROSCOPIC
Bilirubin Urine: NEGATIVE
Glucose, UA: NEGATIVE mg/dL
Ketones, ur: NEGATIVE mg/dL
Nitrite: NEGATIVE
Protein, ur: NEGATIVE mg/dL
Specific Gravity, Urine: <1.005 — ABNORMAL LOW (ref 1.005–1.030)
pH: 5.5 (ref 5.0–8.0)

## 2021-09-19 LAB — URINALYSIS, MICROSCOPIC (REFLEX)

## 2021-09-19 LAB — CBG MONITORING, ED: Glucose-Capillary: 103 mg/dL — ABNORMAL HIGH (ref 70–99)

## 2021-09-19 LAB — RESP PANEL BY RT-PCR (FLU A&B, COVID) ARPGX2
Influenza A by PCR: NEGATIVE
Influenza B by PCR: NEGATIVE
SARS Coronavirus 2 by RT PCR: NEGATIVE

## 2021-09-19 LAB — CBC
HCT: 42.7 % (ref 36.0–46.0)
Hemoglobin: 14.2 g/dL (ref 12.0–15.0)
MCH: 32.6 pg (ref 26.0–34.0)
MCHC: 33.3 g/dL (ref 30.0–36.0)
MCV: 97.9 fL (ref 80.0–100.0)
Platelets: 243 10*3/uL (ref 150–400)
RBC: 4.36 MIL/uL (ref 3.87–5.11)
RDW: 12 % (ref 11.5–15.5)
WBC: 7.3 10*3/uL (ref 4.0–10.5)
nRBC: 0 % (ref 0.0–0.2)

## 2021-09-19 LAB — TROPONIN I (HIGH SENSITIVITY): Troponin I (High Sensitivity): 8 ng/L (ref <18)

## 2021-09-19 IMAGING — DX DG CHEST 1V PORT
1 series · 1 of 1 positions shown · non-contrast
Comparison: [DATE]

CLINICAL DATA: Weakness, failure to thrive

EXAM:
PORTABLE CHEST 1 VIEW

[chest ap]
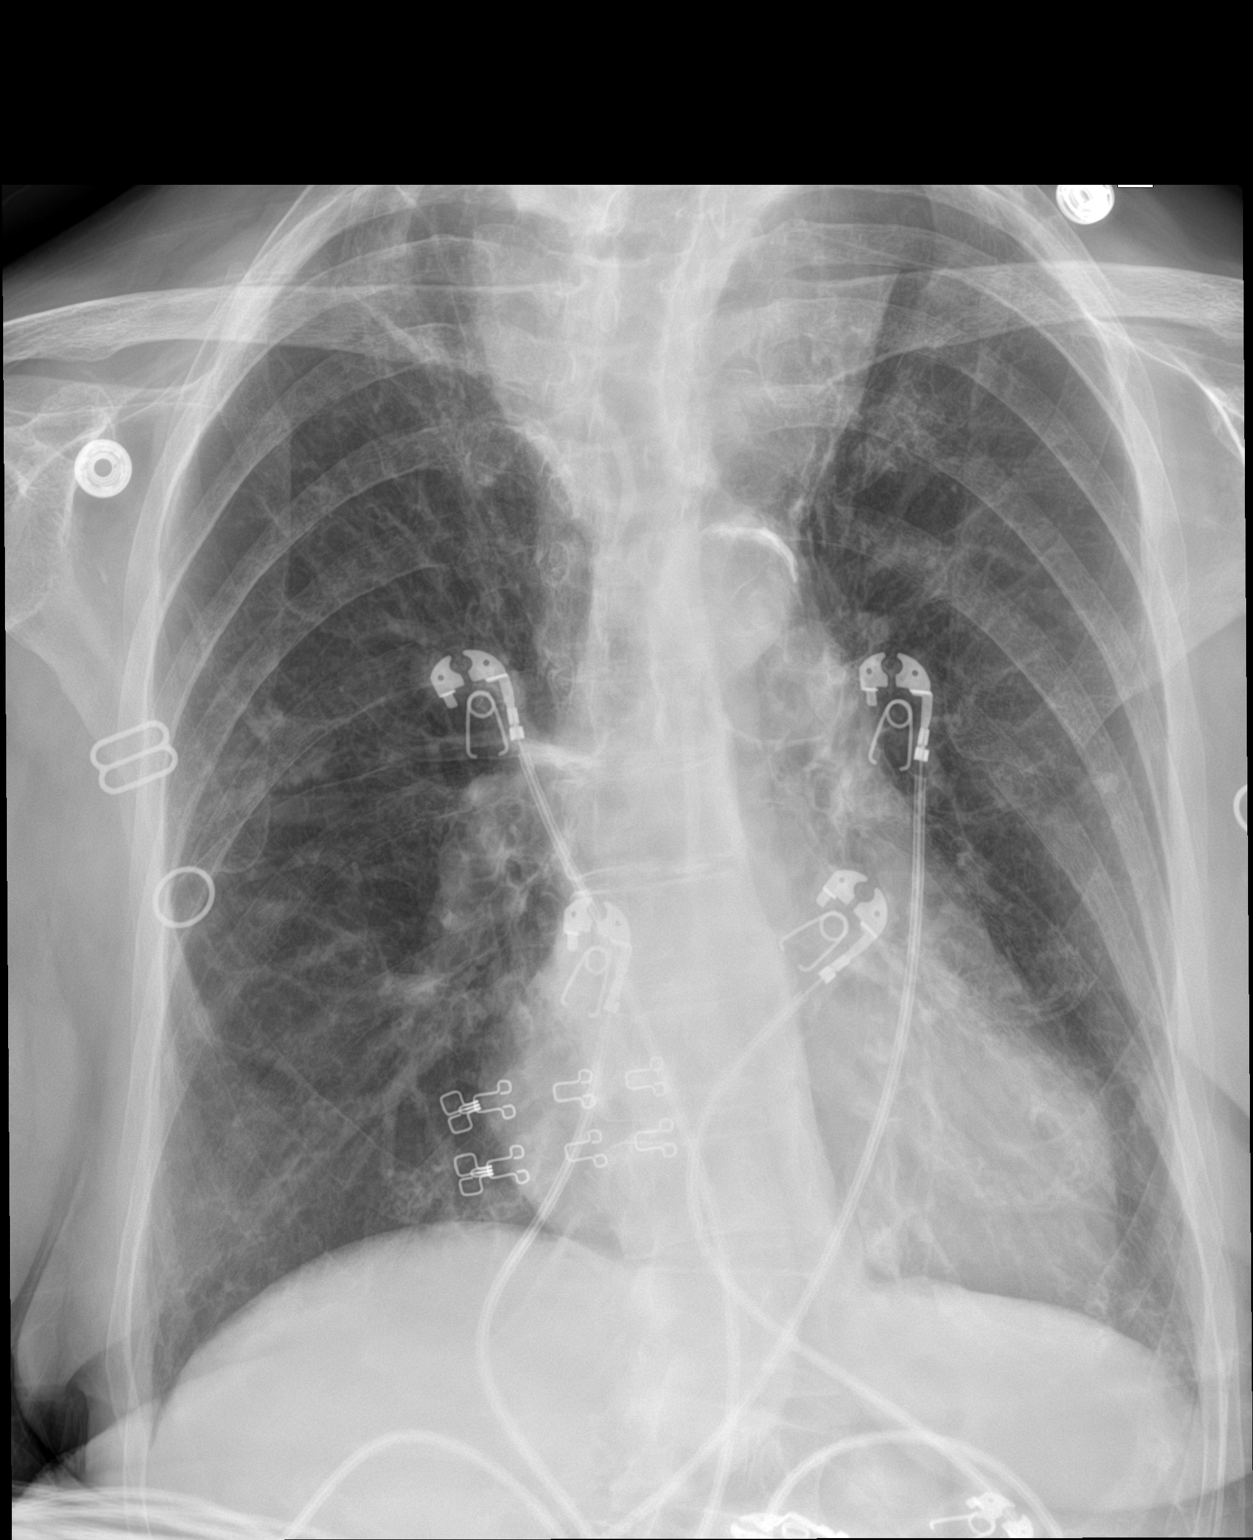

[1 of 1 positions shown; findings below may reference images not displayed]

FINDINGS: Prominent superior mediastinum seen on prior CT to reflect
substernal thyroid goiter. Heart is borderline in size. Aortic
atherosclerosis. There is hyperinflation of the lungs compatible
with COPD. Biapical scarring. No acute confluent opacities or
effusions. No acute bony abnormality.
IMPRESSION: COPD.  Chronic changes.

No active disease.

## 2021-09-19 IMAGING — CT CT HEAD W/O CM
3 series · 15 of 47 positions shown, 18 images · non-contrast
Comparison: [DATE]

CLINICAL DATA: Mental status changes, ongoing weakness and failure
to thrive, loss of appetite, diarrhea, history COPD, hypertension



[Series 3: head 5.0 h30s · axial · 0.44mm/px · z∈[-70,+60]mm · 9 of 32 slices shown, 12 images]
[im 3/32  brain]
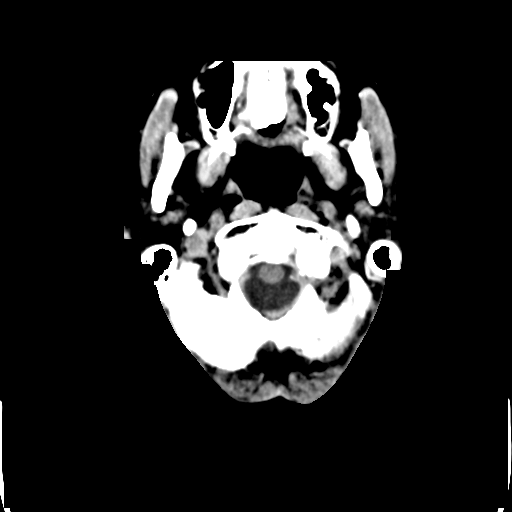
[im 3/32  bone]
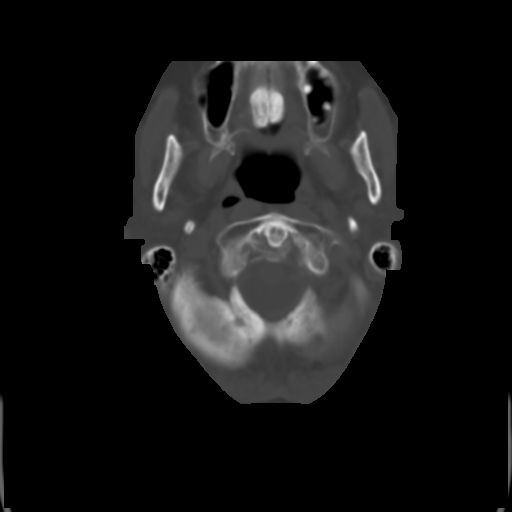
[im 6/32  brain]
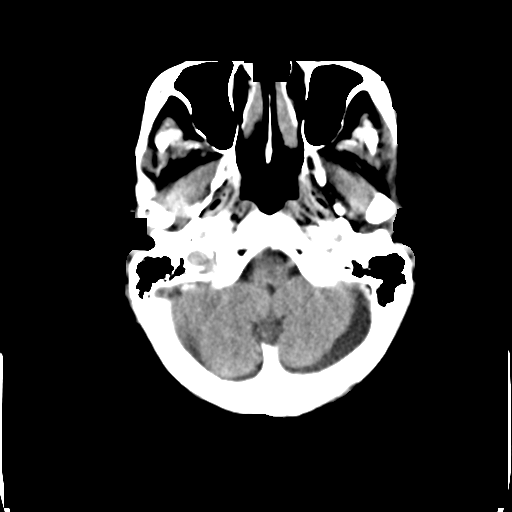
[im 9/32  brain]
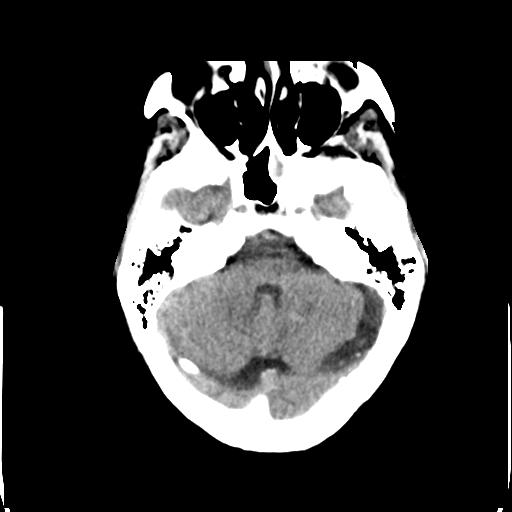
[im 12/32  brain]
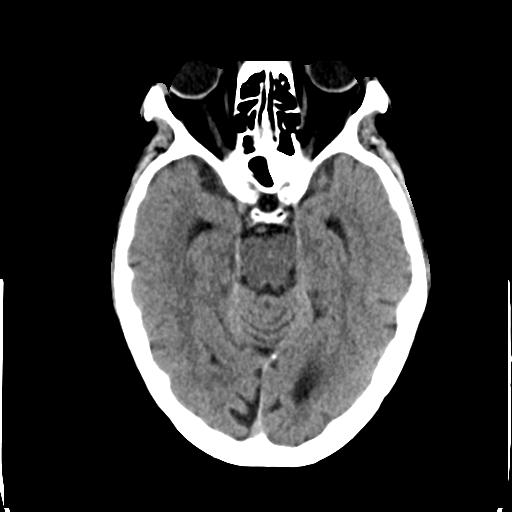
[im 17/32  brain]
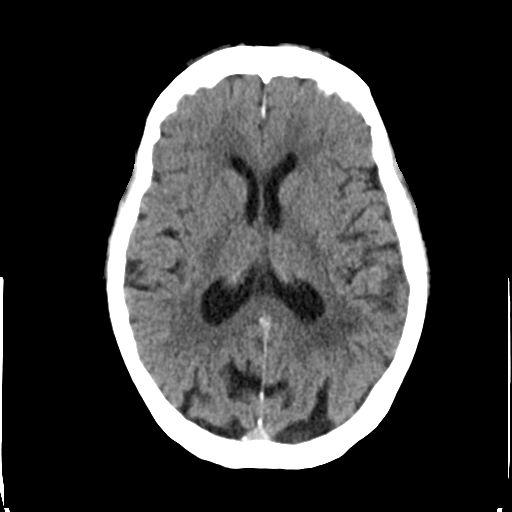
[im 17/32  bone]
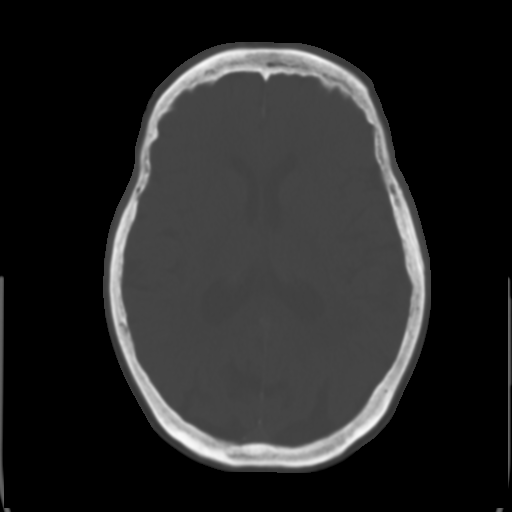
[im 20/32  brain]
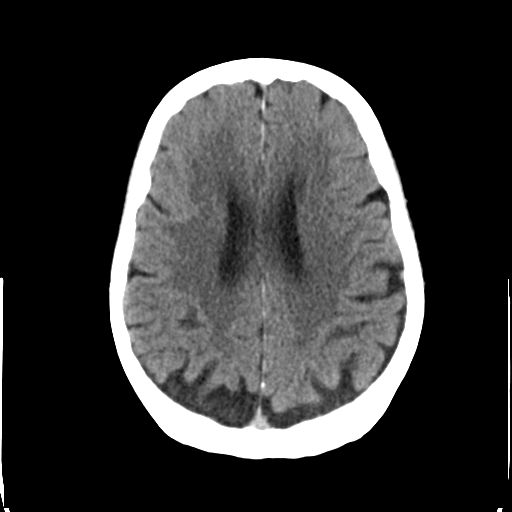
[im 23/32  brain]
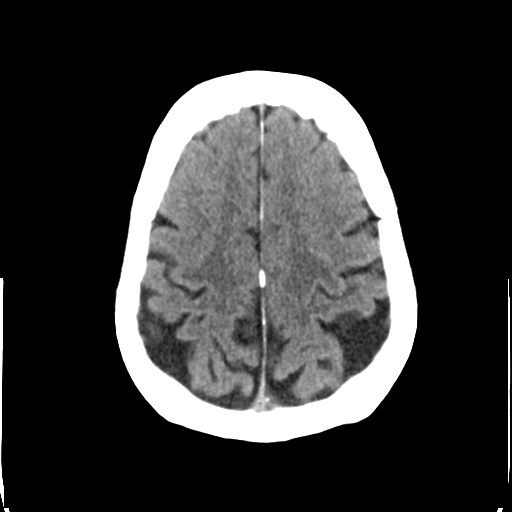
[im 26/32  brain]
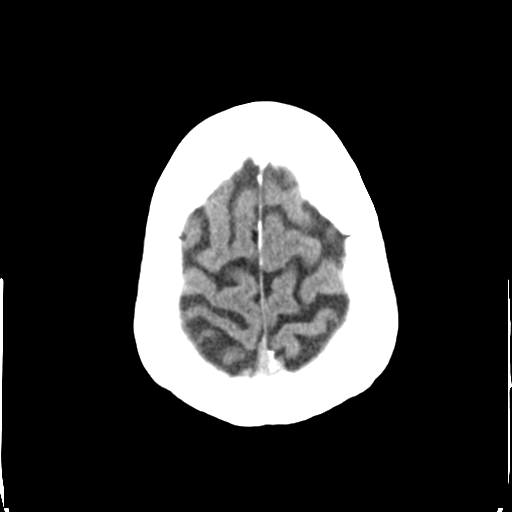
[im 29/32  brain]
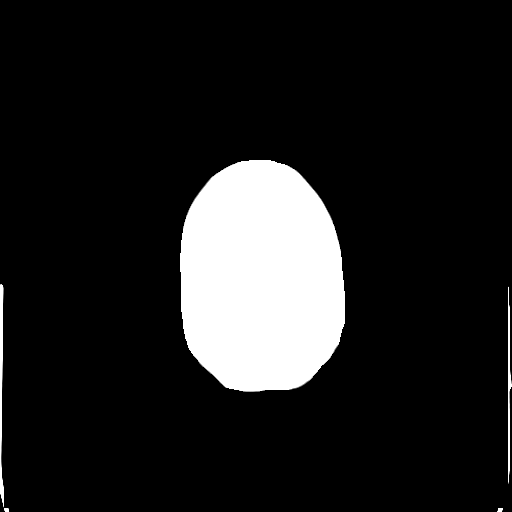
[im 29/32  bone]
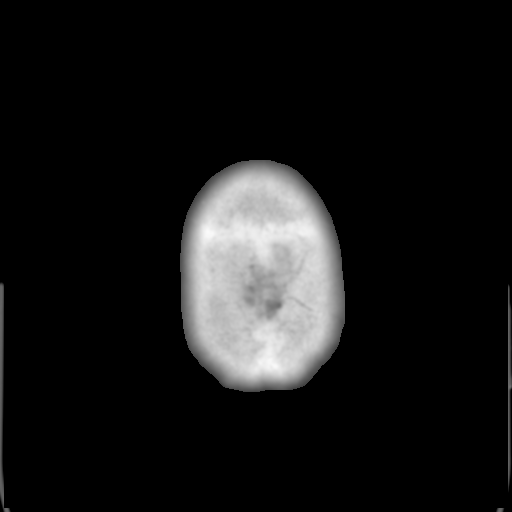

[Series 5: head 3.0 mpr cor · coronal · 0.30mm/px · 3 of 71 slices shown]
[im 24/71  brain]
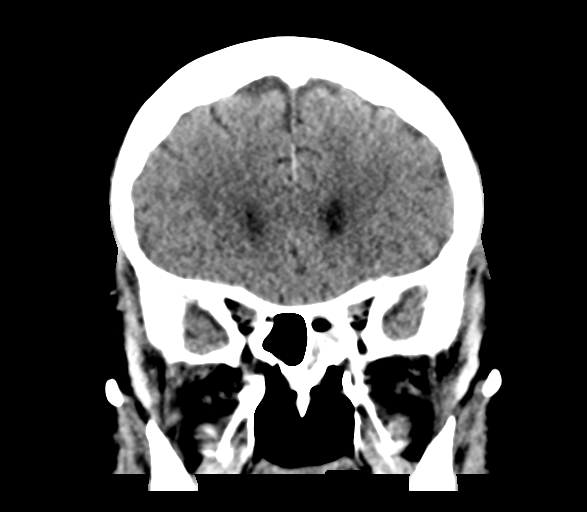
[im 32/71  brain]
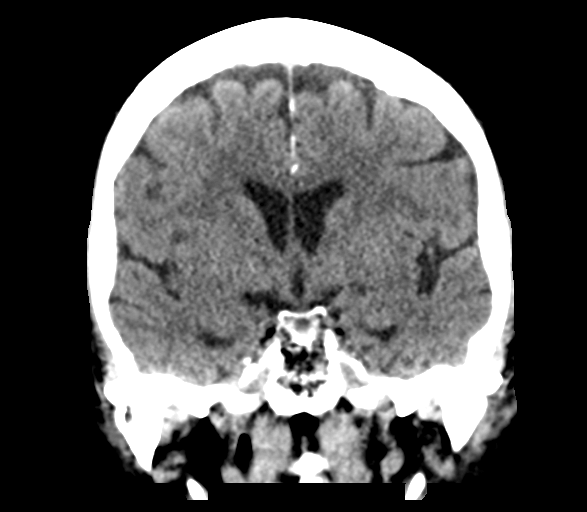
[im 39/71  brain]
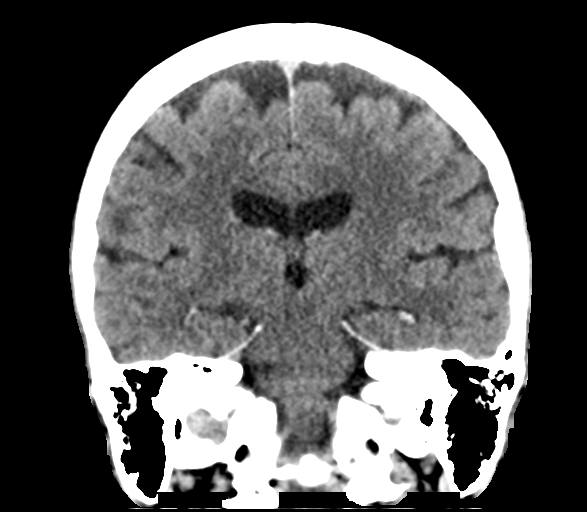

[Series 6: head 3.0 mpr sag · sagittal · 0.30mm/px · 3 of 55 slices shown]
[im 19/55  brain]
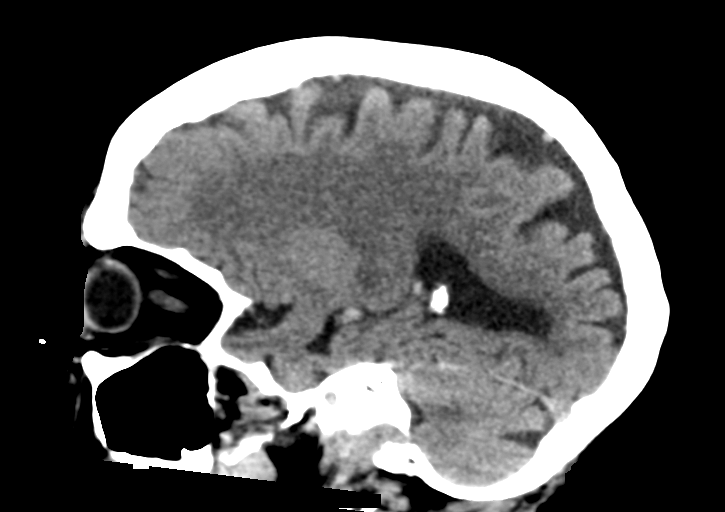
[im 28/55  brain]
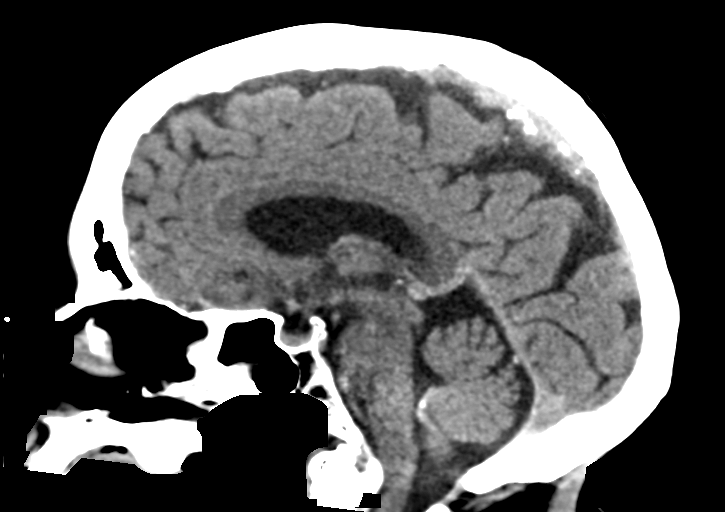
[im 37/55  brain]
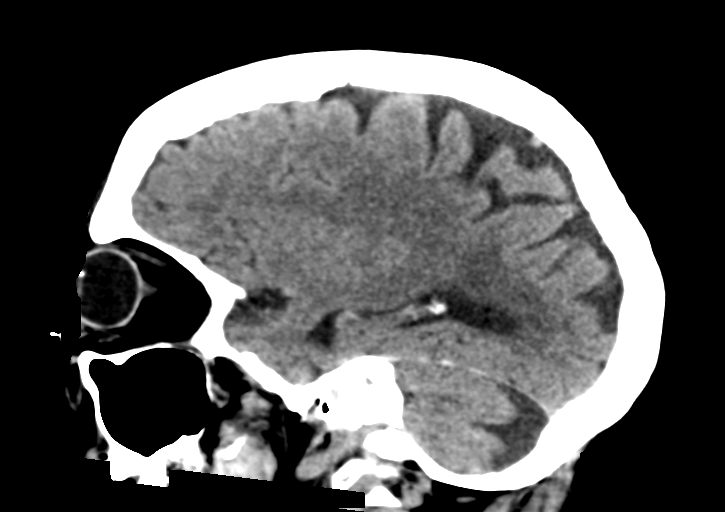

[15 of 47 positions shown; findings below may reference images not displayed]

FINDINGS: Brain: Generalized atrophy, greater posteriorly. Chronic LEFT
cerebellar atrophy and prominence of CSF at LEFT posterior fossa. No
midline shift or mass effect. Small vessel chronic ischemic changes
of deep cerebral white matter. No intracranial hemorrhage, mass
lesion or evidence of acute infarction. No extra-axial collection.
Scattered nonspecific dural calcification.

Vascular: Atherosclerotic calcifications of internal carotid and
vertebral arteries at skull base

Skull: Intact

Sinuses/Orbits: Chronic opacification of LEFT sphenoid sinus. Nasal
septal deviation to the LEFT.

Other: N/A
IMPRESSION: Atrophy with small vessel chronic ischemic changes of deep cerebral
white matter.

No acute intracranial abnormalities.

## 2021-09-19 MED ORDER — ENOXAPARIN SODIUM 40 MG/0.4ML IJ SOSY
40.0000 mg | PREFILLED_SYRINGE | INTRAMUSCULAR | Status: DC
  Administered 2021-09-19 – 2021-09-22 (×4): 40 mg via SUBCUTANEOUS
  Filled 2021-09-19 (×4): qty 0.4

## 2021-09-19 MED ORDER — HYDRALAZINE HCL 20 MG/ML IJ SOLN
10.0000 mg | Freq: Three times a day (TID) | INTRAMUSCULAR | Status: DC | PRN
  Administered 2021-09-19: 10 mg via INTRAVENOUS
  Filled 2021-09-19: qty 1

## 2021-09-19 MED ORDER — ROSUVASTATIN CALCIUM 20 MG PO TABS
20.0000 mg | ORAL_TABLET | Freq: Every day | ORAL | Status: DC
  Administered 2021-09-20 – 2021-09-23 (×4): 20 mg via ORAL
  Filled 2021-09-19 (×5): qty 1

## 2021-09-19 MED ORDER — TIMOLOL MALEATE 0.25 % OP SOLN
1.0000 [drp] | Freq: Every day | OPHTHALMIC | Status: DC
  Administered 2021-09-21 – 2021-09-23 (×3): 1 [drp] via OPHTHALMIC
  Filled 2021-09-19 (×2): qty 5

## 2021-09-19 MED ORDER — FAMOTIDINE 20 MG PO TABS
20.0000 mg | ORAL_TABLET | Freq: Every day | ORAL | Status: DC
  Administered 2021-09-19 – 2021-09-21 (×3): 20 mg via ORAL
  Filled 2021-09-19 (×3): qty 1

## 2021-09-19 MED ORDER — SODIUM CHLORIDE 0.9 % IV BOLUS (SEPSIS)
500.0000 mL | Freq: Once | INTRAVENOUS | Status: AC
  Administered 2021-09-19: 500 mL via INTRAVENOUS

## 2021-09-19 MED ORDER — ASPIRIN EC 81 MG PO TBEC
81.0000 mg | DELAYED_RELEASE_TABLET | Freq: Every day | ORAL | Status: DC
  Administered 2021-09-20 – 2021-09-23 (×4): 81 mg via ORAL
  Filled 2021-09-19 (×4): qty 1

## 2021-09-19 MED ORDER — SODIUM CHLORIDE 0.9 % IV SOLN
1000.0000 mL | INTRAVENOUS | Status: DC
  Administered 2021-09-19: 1000 mL via INTRAVENOUS

## 2021-09-19 MED ORDER — ONDANSETRON HCL 4 MG PO TABS: 4.0000 mg | ORAL_TABLET | Freq: Four times a day (QID) | ORAL | Status: DC | PRN

## 2021-09-19 MED ORDER — ONDANSETRON HCL 4 MG/2ML IJ SOLN: 4.0000 mg | Freq: Four times a day (QID) | INTRAMUSCULAR | Status: DC | PRN

## 2021-09-19 MED ORDER — ASPIRIN 325 MG PO TABS
325.0000 mg | ORAL_TABLET | Freq: Once | ORAL | Status: AC
  Administered 2021-09-19: 325 mg via ORAL
  Filled 2021-09-19: qty 1

## 2021-09-19 MED ORDER — SOTALOL HCL 80 MG PO TABS
80.0000 mg | ORAL_TABLET | Freq: Two times a day (BID) | ORAL | Status: DC
  Administered 2021-09-19 – 2021-09-23 (×8): 80 mg via ORAL
  Filled 2021-09-19 (×10): qty 1

## 2021-09-19 MED ORDER — ACETAMINOPHEN 650 MG RE SUPP: 650.0000 mg | Freq: Four times a day (QID) | RECTAL | Status: DC | PRN

## 2021-09-19 MED ORDER — ACETAMINOPHEN 325 MG PO TABS
650.0000 mg | ORAL_TABLET | Freq: Four times a day (QID) | ORAL | Status: DC | PRN
  Filled 2021-09-19: qty 2

## 2021-09-19 MED ORDER — POLYETHYLENE GLYCOL 3350 17 G PO PACK: 17.0000 g | PACK | Freq: Every day | ORAL | Status: DC | PRN

## 2021-09-19 MED ORDER — ALBUTEROL SULFATE (2.5 MG/3ML) 0.083% IN NEBU: 2.5000 mg | INHALATION_SOLUTION | RESPIRATORY_TRACT | Status: DC | PRN

## 2021-09-19 NOTE — Consult Note (Addendum)
Neurology Consultation  Reason for Consult: Altered Mental Status Referring Physician: Dorie Rank  CC: Encephalopathy  History is obtained from: Daughter and patient  HPI: Linda [name] is a pleasant 88 y.o. female w/ hx of arthritis, COPD, glaucoma, HLD, HTN, osteoporosis, thyroid disease presenting with word finding difficulties and confusion. Patient presented to ED 09/13/21 w/ c/o confusion and difficulty walking w/ BP of 217/74. Patient was to be admitted for hypertensive encephalopathy but pt declined and left AMA. Pt followed up w/ Dr. Jerline Pain 09/15/21 with continued altered mental status and diarrhea with decreased appetite. Recommended admission again at that time but patient declined. Patient went for follow up 09/19/21 with Linda Coke PA and was found to have had poor appetite and continued cognitive decline but resolved diarrhea.  Patient reports having confusion and word finding difficulties for past week. Daughter also notes patient has had significant latency to respond and requiring more time to process information. Daughter reports that patient's confusion is worst in the morning and will fluctuate throughout the day. Daughter also states patient has had poor fluid and po intake for past few weeks. Last measured BP 148/62 and 150/80 at home and at follow up respectively. Patient also reports worsening productive cough but unclear for how long this has been occurring. Patient lives in independent living home and is able to ambulate with rolator. Patient denies any motor or sensory deficits.   Patient reports regular compliance with medication. Patient not on any antithrombotic or antiplatelet agents.   ROS: A complete ROS was performed and is negative except as noted in the HPI.  Past Medical History:  Diagnosis Date   Arthritis    COPD (chronic obstructive pulmonary disease) (HCC)    chronic cough   Glaucoma    Hyperlipidemia    Hypertension    Irregular heartbeat     Osteoporosis    Thyroid disease    enlarged thyroid     Family History  Problem Relation Age of Onset   Heart disease Mother    Heart disease Father    Heart disease Brother    Heart disease Brother    Colon cancer Neg Hx    Colon polyps Neg Hx    Esophageal cancer Neg Hx      Social History:   reports that she quit smoking about 71 years ago. Her smoking use included cigarettes. She started smoking about 73 years ago. She has a 0.50 pack-year smoking history. She has never used smokeless tobacco. She reports that she does not currently use alcohol. She reports that she does not use drugs.  Medications  Current Facility-Administered Medications:    [COMPLETED] sodium chloride 0.9 % bolus 500 mL, 500 mL, Intravenous, Once, Last Rate: 999 mL/hr at 09/19/21 1547, 500 mL at 09/19/21 1547 **FOLLOWED BY** 0.9 %  sodium chloride infusion, 1,000 mL, Intravenous, Continuous, Dorie Rank, MD, Last Rate: 125 mL/hr at 09/19/21 1548, 1,000 mL at 09/19/21 1548  Current Outpatient Medications:    acetaminophen (TYLENOL) 500 MG tablet, Take 1,000 mg by mouth every 6 (six) hours as needed., Disp: , Rfl:    amLODipine (NORVASC) 5 MG tablet, Take 1 tablet (5 mg total) by mouth daily. (Patient not taking: Reported on 09/15/2021), Disp: 30 tablet, Rfl: 0   Cholecalciferol (VITAMIN D3) 25 MCG (1000 UT) CAPS, Take 1 capsule by mouth 2 (two) times daily., Disp: , Rfl:    clobetasol cream (TEMOVATE) [id number] %, Apply 1 application topically as needed., Disp: 60 g, Rfl: 5  famotidine (PEPCID) 20 MG tablet, Take 1 tablet (20 mg total) by mouth 2 (two) times daily., Disp: 180 tablet, Rfl: 3   FLUAD 0.5 ML SUSY, , Disp: , Rfl:    fluticasone (FLONASE) 50 MCG/ACT nasal spray, USE 1 SPRAY IN EACH NOSTRIL DAILY AS NEEDED FOR ALLERGIES OR RHINITIS, Disp: 48 g, Rfl: 5   rosuvastatin (CRESTOR) 20 MG tablet, Take 1 tablet (20 mg total) by mouth daily., Disp: 90 tablet, Rfl: 3   sotalol (BETAPACE) 80 MG tablet, Take 1  tablet (80 mg total) by mouth every 12 (twelve) hours., Disp: 180 tablet, Rfl: 3   timolol (TIMOPTIC) 0.25 % ophthalmic solution, Place 1 drop into both eyes daily., Disp: , Rfl:    Exam: Current vital signs: BP (!) 184/60    Pulse 66    Temp 97.7 F (36.5 C) (Oral)    Resp 20    Ht 5\' 5"  (1.651 m)    Wt 57 kg    SpO2 97%    BMI 20.91 kg/m  Vital signs in last 24 hours: Temp:  [97.5 F (36.4 C)-97.7 F (36.5 C)] 97.7 F (36.5 C) (02/06 1330) Pulse Rate:  [65-71] 66 (02/06 1545) Resp:  [14-20] 20 (02/06 1545) BP: (149-191)/(48-80) 184/60 (02/06 1545) SpO2:  [96 %-97 %] 97 % (02/06 1545) Weight:  [56.2 kg-57 kg] 57 kg (02/06 1128)  GENERAL: Awake, alert, in no acute distress Psych: Affect appropriate for situation, patient is calm and cooperative with examination Head: Normocephalic and atraumatic, without obvious abnormality EENT: Normal conjunctivae, dry mucous membranes, no OP obstruction LUNGS: Normal respiratory effort. Non-labored breathing on room air CV: Regular rate and rhythm on telemetry ABDOMEN: Soft, non-tender, non-distended Extremities: warm, well perfused, without obvious deformity  NEURO:  Mental Status: Awake, alert, and oriented to person and is able to orient to place and age with extended contemplation and not oriented to time. She is able to provide a limited history of present illness. Speech/Language: speech is clear.   Naming, repetition, fluency, and comprehension intact without aphasia.  No neglect is noted Cranial Nerves:  II: PERRL. visual fields full.  III, IV, VI: EOMI. Lid elevation symmetric and full.  V: Sensation is intact to light touch and symmetrical to face. Blinks to threat. Moves jaw back and forth.  VII: Face is symmetric resting and smiling. Able to puff cheeks and raise eyebrows.  VIII: Hearing intact to voice IX, X: Palate elevation is symmetric. Phonation normal.  XI: Normal sternocleidomastoid and trapezius muscle strength XII:  Tongue protrudes midline without fasciculations.   Motor: 5/5 strength is all muscle groups.  Tone is normal. Bulk is normal.  Sensation: Intact to light touch bilaterally in all four extremities. No extinction to DSS present.  Coordination: FTN intact bilaterally. HKS intact bilaterally. No pronator drift. Alternating hand movements.  DTRs: 2+ throughout.  Gait: Deferred  NIHSS: 1a Level of Conscious.: 0 1b LOC Questions: 0 1c LOC Commands: 0 2 Best Gaze: 0 3 Visual: 0 4 Facial Palsy: 0 5a Motor Arm - left: 0 5b Motor Arm - Right: 0 6a Motor Leg - Left: 0 6b Motor Leg - Right: 0 7 Limb Ataxia: 0 8 Sensory: 0 9 Best Language: 1 10 Dysarthria: 0 11 Extinct. and Inatten.: 0 TOTAL: 1   Labs I have reviewed labs in epic and the results pertinent to this consultation are:  CBC    Component Value Date/Time   WBC 7.3 09/19/2021 1131   RBC 4.36 09/19/2021 1131  HGB 14.2 09/19/2021 1131   HCT 42.7 09/19/2021 1131   PLT 243 09/19/2021 1131   MCV 97.9 09/19/2021 1131   MCH 32.6 09/19/2021 1131   MCHC 33.3 09/19/2021 1131   RDW 12.0 09/19/2021 1131   LYMPHSABS 1.3 09/14/2021 1512   MONOABS 0.6 09/14/2021 1512   EOSABS 0.1 09/14/2021 1512   BASOSABS 0.0 09/14/2021 1512    CMP     Component Value Date/Time   NA 130 (L) 09/19/2021 1131   NA 140 07/18/2017 0000   K 3.5 09/19/2021 1131   CL 97 (L) 09/19/2021 1131   CO2 23 09/19/2021 1131   GLUCOSE 172 (H) 09/19/2021 1131   BUN 7 (L) 09/19/2021 1131   BUN 8 07/18/2017 0000   CREATININE 0.69 09/19/2021 1131   CREATININE 0.62 06/23/2020 1027   CALCIUM 10.1 09/19/2021 1131   PROT 7.2 09/19/2021 1131   ALBUMIN 3.9 09/19/2021 1131   AST 29 09/19/2021 1131   ALT 20 09/19/2021 1131   ALKPHOS 83 09/19/2021 1131   BILITOT 0.5 09/19/2021 1131   GFRNONAA >60 09/19/2021 1131   GFRNONAA 77 06/23/2020 1027   GFRAA 89 06/23/2020 1027    Lipid Panel     Component Value Date/Time   CHOL 158 06/23/2020 1027   TRIG 139  06/23/2020 1027   HDL 57 06/23/2020 1027   CHOLHDL 2.8 06/23/2020 1027   VLDL 27.0 03/14/2019 1144   LDLCALC 77 06/23/2020 1027     Imaging I have reviewed the images obtained:  CT-scan of the brain: no acute intracranial abnormalities, atrophy with small vessel chronic ischemic changes   MRI examination of the brain (09/14/21): no acute intracranial processes/infarcts. Foci of hemosiderin deposition in the left-greater-than-right  Assessment: Dlorah Stranahan is a pleasant 88 y.o. female w/ hx of arthritis, COPD, glaucoma, HLD, HTN, osteoporosis, thyroid disease presenting with word finding difficulties and confusion. Patient to be admitted for workup of encephalopathy. Patient will also have stroke/TIA workup completed while inpatient (echo, A1c, lipid panel). Recent MRI showed no acute intracranial infarcts.   Impression: Metabolic Encephalopathy vs HTN encephalopathy vs Delirium vs TIA vs stroke  Recommendations: Complete remainder of stroke workup -Echo -Carotid  -Lipid Panel -A1c -ASA 325 mg followed by ASA 81 mg -Recommend PT/OT evaluation -Stroke team to follow  DELIRIUM RECS 1: Avoid benzodiazepines, antihistamines, anticholinergics, and minimize opiate use as these may worsen delirium. 2:Assess, prevent and manage pain as lack of treatment can result in delirium.  3: Recommend consult to PT/OT if not already done. Early mobility and exercise has been shown to decrease duration of delirium.  4:Provide appropriate lighting and clear signage; a clock and calendar should be easily visible to the patient. 5:Monitor environmental factors. Reduce light and noise at night (close shades, turn off lights, turn off TV, ect). Correct any alterations in sleep cycle. 6: Reorient the patient to person, place, time and situation on each encounter.  7: Correct sensory deficits if possible (replace eye glasses, hearing aids, ect). 8: Avoid restraints. Severely delirious patients benefit from  constant observation by a sitter. 9: Do not leave patient unattended.  Pt seen by Resident and later by MD. Note/plan to be edited by MD as needed.  France Ravens, MD PGY1 Resident  ATTENDING ATTESTATION:  Pt with confusion/word finding difficulties. Stat aspirin 325mg . Non focal exam. Daughter thinks it is new. Pt refused admission to complete stroke workup. EEG for confusion vs. Delirium. Improved with IV fluids in ER.   Dr. Reeves Forth evaluated pt independently,  reviewed imaging, chart, labs. Discussed and formulated plan with the resident. Please see resident note above for details.   Total 60 minutes spent on counseling patient and coordinating care, writing notes and reviewing chart.  Kaoir Loree,MD

## 2021-09-19 NOTE — Progress Notes (Signed)
EEG complete - results pending 

## 2021-09-19 NOTE — ED Triage Notes (Signed)
Pt arrives POV for eval of ongoing weakness and failure to thrive. Pt was seen 1/31 and 2/1 for possible stroke, which was negative in w/u. Pt seen again for diarrhea, weakness and loss of appetite. Pt was seen at PCP today, found to have Na of 130. Family notes that pt is increasingly weak and less independent. Pt is concerned that symptoms are progressing since last visit to ED. Pt is alert, disoriented, NAD in triage.

## 2021-09-19 NOTE — Progress Notes (Signed)
Linda [name] is a 88 y.o. female here for a follow up of altered mental status.   History of Present Illness:   Chief Complaint  Patient presents with   Altered Mental Status    Pt is having some confusion, and just does not feel good.   Linda [name] presents to today's visit with her daughter, Malachy Mood.   Altered Mental Status On 09/13/21, Linda [name] presented to the ED with c/o confusion and difficulty walking that had been onset for a few hours.  Shortly after arriving, it was found that her BP was 217/74. Following further workup which included an MIR and CT scan, there were no signs of acute stroke. Although pt showed signs of improvement during her stay in the ED, she still had a recurrence of confusion and difficulty with word finding. There was discussion about admission for hypertensive encephalopathy following a consultation with neurology but pt declined this and left AMA.   Despite this, she did follow up with Dr. Jerline Pain on 09/15/21 about this issue and reported she was still not doing well. Pt noted she was experiencing diarrhea and decreased appetite. Diarrhea has overall resolved at this point. Does continue to have a poor appetite.  Blood work from Dr. Marigene Ehlers visit reviewed with daughter today, of note: -Sodium trending down, most recently 130 -BUN and Cre overall increasing -B12 low-normal -Urine unremarkable  Since leaving our office last week for Dr. Marigene Ehlers f/u visit, she has continued to decline. Patient tells [place] and daughter that she "feels bad, something is wrong, I'm not going to make it another 3 months." Pt is visibly distressed due to current sx and feeling helpless.   According to her daughter, she was able to function and live independently prior to ED visit, which makes her even more worried as to what could be occurring. Since prior visit on 09/15/21, pt's diarrhea has resolved, but she is still experiencing increased confusion upon waking up. After further  discussion, Malachy Mood admits that she felt her mother left the ED AMA due to exhaustion. Despite having a follow up appointment with neurology within 3 months, both Bijan and Malachy Mood are interested in exploring different options to figure out what is occurring, including an additional visit to the ED. Denies any pain.   Past Medical History:  Diagnosis Date   Arthritis    COPD (chronic obstructive pulmonary disease) (HCC)    chronic cough   Glaucoma    Hyperlipidemia    Hypertension    Irregular heartbeat    Osteoporosis    Thyroid disease    enlarged thyroid     Social History   Tobacco Use   Smoking status: Former    Packs/day: 0.25    Years: 2.00    Pack years: 0.50    Types: Cigarettes    Start date: 13    Quit date: 1952    Years since quitting: 71.1   Smokeless tobacco: Never  Vaping Use   Vaping Use: Never used  Substance Use Topics   Alcohol use: Not Currently   Drug use: Never    Past Surgical History:  Procedure Laterality Date   APPENDECTOMY     cateract surgery     COLONOSCOPY     Done in [place]  yrs ago   UPPER GASTROINTESTINAL ENDOSCOPY  2019   WRIST SURGERY      Family History  Problem Relation Age of Onset   Heart disease Mother    Heart disease Father    Heart disease  Brother    Heart disease Brother    Colon cancer Neg Hx    Colon polyps Neg Hx    Esophageal cancer Neg Hx     No Known Allergies  Current Medications:   Current Outpatient Medications:    acetaminophen (TYLENOL) 500 MG tablet, Take 1,000 mg by mouth every 6 (six) hours as needed., Disp: , Rfl:    Cholecalciferol (VITAMIN D3) 25 MCG (1000 UT) CAPS, Take 1 capsule by mouth 2 (two) times daily., Disp: , Rfl:    clobetasol cream (TEMOVATE) AB-123456789 %, Apply 1 application topically as needed., Disp: 60 g, Rfl: 5   famotidine (PEPCID) 20 MG tablet, Take 1 tablet (20 mg total) by mouth 2 (two) times daily., Disp: 180 tablet, Rfl: 3   FLUAD 0.5 ML SUSY, , Disp: , Rfl:    fluticasone  (FLONASE) 50 MCG/ACT nasal spray, USE 1 SPRAY IN EACH NOSTRIL DAILY AS NEEDED FOR ALLERGIES OR RHINITIS, Disp: 48 g, Rfl: 5   rosuvastatin (CRESTOR) 20 MG tablet, Take 1 tablet (20 mg total) by mouth daily., Disp: 90 tablet, Rfl: 3   sotalol (BETAPACE) 80 MG tablet, Take 1 tablet (80 mg total) by mouth every 12 (twelve) hours., Disp: 180 tablet, Rfl: 3   timolol (TIMOPTIC) 0.25 % ophthalmic solution, Place 1 drop into both eyes daily., Disp: , Rfl:    amLODipine (NORVASC) 5 MG tablet, Take 1 tablet (5 mg total) by mouth daily. (Patient not taking: Reported on 09/15/2021), Disp: 30 tablet, Rfl: 0   Review of Systems:   ROS Negative unless otherwise specified per HPI. Vitals:   Vitals:   09/19/21 0931  BP: (!) 150/80  Pulse: 71  Temp: 97.7 F (36.5 C)  TempSrc: Temporal  SpO2: 96%  Weight: 124 lb (56.2 kg)  Height: 5\' 5"  (1.651 m)     Body mass index is 20.63 kg/m.  Physical Exam:   Physical Exam Vitals and nursing note reviewed.  Constitutional:      General: She is not in acute distress.    Appearance: She is well-developed. She is not ill-appearing or toxic-appearing.  Cardiovascular:     Rate and Rhythm: Normal rate and regular rhythm.     Pulses: Normal pulses.     Heart sounds: Normal heart sounds, S1 normal and S2 normal.  Pulmonary:     Effort: Pulmonary effort is normal.     Breath sounds: Normal breath sounds.  Skin:    General: Skin is warm and dry.  Neurological:     Mental Status: She is alert.     GCS: GCS eye subscore is 4. GCS verbal subscore is 5. GCS motor subscore is 6.  Psychiatric:        Mood and Affect: Affect is tearful.        Speech: Speech normal.        Behavior: Behavior is agitated and slowed. Behavior is cooperative.        Thought Content: Thought content is not paranoid. Thought content does not include suicidal ideation.    Assessment and Plan:   Altered mental status, unspecified altered mental status type Worsening Reviewed most  recent ER notes and PCP notes/labs with daughter and colleague Dr. Garret Reddish After shared decision making with patient and daughter, feel as though patient would best be served in ER for further evaluation and management of her symptoms Patient and daughter are in agreement and state that they will proceed to ER at this time  Kizzie Fantasia  [place],acting as a Education administrator for [employer], PA.,have documented all relevant documentation on the behalf of Inda Coke, PA,as directed by  Inda Coke, PA while in the presence of Inda Coke, [place].  I, Inda Coke, [place], have reviewed all documentation for this visit. The documentation on 09/19/21 for the exam, diagnosis, procedures, and orders are all accurate and complete.  Time spent with patient today was 30 minutes which consisted of chart review of ED notes, PCP notes, discussing diagnosis, discussing plan with additional provider, work up, treatment answering questions and documentation.  Inda Coke, PA-C

## 2021-09-19 NOTE — H&P (Signed)
History and Physical    Patient: Linda [name] [id number] DOB: 23-May-1924 DOA: 09/19/2021 DOS: the patient was seen and examined on 09/19/2021 PCP: Vivi Barrack, MD  Patient coming from: ALF/ILF  Chief Complaint:  AMS  HPI: Linda [name] is a 88 y.o. female with medical history significant of HTN, HLD, COPD, glaucoma presents to the ED with multiple complaints including intermittent/recurrent confusion, difficulty finding words, poor appetite, generalized weakness and a sensation of not feeling good that has been ongoing for the past 1 week.  Of note, patient first presented to the ED on 09/13/2021, at that time SBP noted to be in the 200s, with similar complaints, MRI and CT scans done today and were negative for acute stroke.  Discussions about admission for possible hypertensive encephalopathy was discussed with patient and the neurology consultation, but patient left AMA.  Patient returned to the ED on 09/14/2021 again with similar complaints, recommended for admission for further eval, pt declined and signed AMA again. On 09/15/21 pt followed up with PCP and basic lab work was done, reported resolved diarrhea. On 09/19/21, pt went to PCP again still with similar complaints and was advised to go to the ED. According to her daughter, patient was able to perform her ADLs in an independent living facility, ambulates with a rollator and was at her baseline up until 09/13/2021.  Patient denies any headaches, chest pain, shortness of breath, fever/chills, diarrhea, focal weakness, falls.  Reports some productive cough of whitish sputum which has been ongoing for the past couple of days.    In the ED, vital signs fairly stable except for some SBP's in the 190s, labs unremarkable except for sodium of 130, chloride 97, UA showed small hemoglobin, trace leukocytes, SG less than 1.005, rare bacteria.  CT head with no acute intracranial abnormalities.  EDP spoke to neurology, recommended admission and they will  consult.  Patient admitted for further management.  Review of Systems: As mentioned in the history of present illness. All other systems reviewed and are negative. Past Medical History:  Diagnosis Date   Arthritis    COPD (chronic obstructive pulmonary disease) (HCC)    chronic cough   Glaucoma    Hyperlipidemia    Hypertension    Irregular heartbeat    Osteoporosis    Thyroid disease    enlarged thyroid   Past Surgical History:  Procedure Laterality Date   APPENDECTOMY     cateract surgery     COLONOSCOPY     Done in [place]  yrs ago   UPPER GASTROINTESTINAL ENDOSCOPY  2019   WRIST SURGERY     Social History:  reports that she quit smoking about 71 years ago. Her smoking use included cigarettes. She started smoking about 73 years ago. She has a 0.50 pack-year smoking history. She has never used smokeless tobacco. She reports that she does not currently use alcohol. She reports that she does not use drugs.  No Known Allergies  Family History  Problem Relation Age of Onset   Heart disease Mother    Heart disease Father    Heart disease Brother    Heart disease Brother    Colon cancer Neg Hx    Colon polyps Neg Hx    Esophageal cancer Neg Hx     Prior to Admission medications   Medication Sig Start Date End Date Taking? Authorizing Provider  acetaminophen (TYLENOL) 500 MG tablet Take 1,000 mg by mouth every 6 (six) hours as needed.    [provider]  amLODipine (NORVASC) 5 MG tablet Take 1 tablet (5 mg total) by mouth daily. Patient not taking: Reported on 09/15/2021 09/14/21   Gareth Morgan, MD  Cholecalciferol (VITAMIN D3) 25 MCG (1000 UT) CAPS Take 1 capsule by mouth 2 (two) times daily.    [provider]  clobetasol cream (TEMOVATE) AB-123456789 % Apply 1 application topically as needed. 08/16/21   Vivi Barrack, MD  famotidine (PEPCID) 20 MG tablet Take 1 tablet (20 mg total) by mouth 2 (two) times daily. 03/09/21   Vivi Barrack, MD  FLUAD 0.5 ML SUSY   04/24/18   [provider]  fluticasone (FLONASE) 50 MCG/ACT nasal spray USE 1 SPRAY IN EACH NOSTRIL DAILY AS NEEDED FOR ALLERGIES OR RHINITIS 07/27/20   Orma Flaming, MD  rosuvastatin (CRESTOR) 20 MG tablet Take 1 tablet (20 mg total) by mouth daily. 03/09/21   Vivi Barrack, MD  sotalol (BETAPACE) 80 MG tablet Take 1 tablet (80 mg total) by mouth every 12 (twelve) hours. 05/25/21   Vivi Barrack, MD  timolol (TIMOPTIC) 0.25 % ophthalmic solution Place 1 drop into both eyes daily. 02/22/19   [provider]    Physical Exam: Vitals:   09/19/21 1330 09/19/21 1445 09/19/21 1545 09/19/21 1630  BP: (!) 149/55 (!) 191/57 (!) 184/60 (!) 162/59  Pulse: 65 65 66 65  Resp: 14 20 20 20   Temp: 97.7 F (36.5 C)     TempSrc: Oral     SpO2: 97% 96% 97% 100%  Weight:      Height:       General: NAD, elderly, somewhat tearful about situation Cardiovascular: S1, S2 present Respiratory: CTAB Abdomen: Soft, nontender, nondistended, bowel sounds present Musculoskeletal: No bilateral pedal edema noted Skin: Normal Psychiatry: Normal mood    Data Reviewed: Labs unremarkable except for sodium of 130, chloride 97 UA showed small hemoglobin, trace leukocytes, SG less than 1.005, rare bacteria CT head with no acute intracranial abnormalities EKG with sinus rhythm first-degree AV block   Assessment and Plan:  Recurrent/intermittent altered mental status Acute metabolic encephalopathy Currently, unknown etiology ?Hypertensive encephalopathy Vs TIA/CVA, Vs ?early phase of mild cognitive impairment CT head and MRI brain unremarkable for any acute intracranial abnormality Rule out any infectious causes, UA with trace leukocytes, rare bacteria, urine culture on 09/15/2021 no growth Chest x-ray pending EEG pending Vit B12 369, RPR pending Neurology consulted, appreciate recs, complete CVA work up, ECHO, carotid doppler, A1c, lipid panel Telemetry, frequent  neurochecks PT/OT/SLP Monitor closely  Hyponatremia Likely 2/2 poor oral intake Urine lites pending Continue with IV fluids Daily BMP  Hypertension BP noted to be uncontrolled Does not seem to be taking any BP meds, unsure about sotalol as heart rate is in the 60s Review hydralazine as needed for now  Hyperlipidemia Lipid panel pending in a.m. Continue Crestor  COPD Reported some productive cough of whitish phlegm Chest x-ray pending Not on any inhaler, albuterol as needed  Glaucoma Continue timolol eyedrops       Advance Care Planning: DNR  Consults:  EDP consulted neurology  Family Communication: Discussed with daughter at bedside  Severity of Illness: The appropriate patient status for this patient is INPATIENT. Inpatient status is judged to be reasonable and necessary in order to provide the required intensity of service to ensure the patient's safety. The patient's presenting symptoms, physical exam findings, and initial radiographic and laboratory data in the context of their chronic comorbidities is felt to place them at  high risk for further clinical deterioration. Furthermore, it is not anticipated that the patient will be medically stable for discharge from the hospital within 2 midnights of admission.   * I certify that at the point of admission it is my clinical judgment that the patient will require inpatient hospital care spanning beyond 2 midnights from the point of admission due to high intensity of service, high risk for further deterioration and high frequency of surveillance required.*  Author: Alma Friendly, MD 09/19/2021 6:10 PM  For on call review [email or web address].

## 2021-09-19 NOTE — ED Provider Triage Note (Signed)
Emergency Medicine Provider Triage Evaluation Note  Linda [name] , a 88 y.o. female  was evaluated in triage.  Pt complains of confusion.  Patient was seen at her primary doctor today after ER visit for diarrhea, weakness, decreased appetite.  PCP noted that patient's sodium was downtrending, most recently 130, BUN and creatinine overall increasing, low B12, unremarkable urine.  PCP states that while patient had previously left the emergency department AMA due to exhaustion, but the patient and her daughter are interested in exploring different options of her symptoms, including additional visit to the emergency department.  She does have a neurology follow-up appointment scheduled in 3 months.  Review of Systems  Positive: Weakness, decreased appetite, confusion Negative: Fever, chills, diarrhea  Physical Exam  BP (!) 160/48 (BP Location: Left Arm)    Pulse 69    Temp (!) 97.5 F (36.4 C) (Oral)    Resp 14    SpO2 97%  Gen:   Awake, no distress   Resp:  Normal effort  MSK:   Moves extremities without difficulty  Other:  Chronically ill appearing  Medical Decision Making  Medically screening exam initiated at 11:26 AM.  Appropriate orders placed.  Javaeh Kuznicki was informed that the remainder of the evaluation will be completed by another provider, this initial triage assessment does not replace that evaluation, and the importance of remaining in the ED until their evaluation is complete.  Will start weakness workup, anticipate this will be a failure to thrive evaluation   Xiao Graul T, PA-C 09/19/21 1128

## 2021-09-19 NOTE — ED Provider Notes (Signed)
[facility] EMERGENCY DEPARTMENT Provider Note   CSN: [id number] Arrival date & time: 09/19/21  1109     History  Chief Complaint  Patient presents with   Weakness    Linda [name] is a 88 y.o. female.   Weakness Associated symptoms: no diarrhea, no fever, no headaches and no vomiting     Pt states she feels really bad.  Daughter adds that she has been confused recently.  Pt is having trouble finding words, expressing her thoughts.  Pt is having difficulty using the telephone, mistaking the telephone for the tv remote.  It is worst in the morning but seems a bit better at the end of the day.  Today the symptoms are going on again.  She went to the primary care doctor and they felt she needed to come to the ED.  She has been sleeping more.   No recent changes in medications.  Shingles shot a couple weeks ago.  Home Medications Prior to Admission medications   Medication Sig Start Date End Date Taking? Authorizing Provider  acetaminophen (TYLENOL) 500 MG tablet Take 1,000 mg by mouth every 6 (six) hours as needed.    [provider]  amLODipine (NORVASC) 5 MG tablet Take 1 tablet (5 mg total) by mouth daily. Patient not taking: Reported on 09/15/2021 09/14/21   Gareth Morgan, MD  Cholecalciferol (VITAMIN D3) 25 MCG (1000 UT) CAPS Take 1 capsule by mouth 2 (two) times daily.    [provider]  clobetasol cream (TEMOVATE) AB-123456789 % Apply 1 application topically as needed. 08/16/21   Vivi Barrack, MD  famotidine (PEPCID) 20 MG tablet Take 1 tablet (20 mg total) by mouth 2 (two) times daily. 03/09/21   Vivi Barrack, MD  FLUAD 0.5 ML SUSY  04/24/18   [provider]  fluticasone (FLONASE) 50 MCG/ACT nasal spray USE 1 SPRAY IN EACH NOSTRIL DAILY AS NEEDED FOR ALLERGIES OR RHINITIS 07/27/20   Orma Flaming, MD  rosuvastatin (CRESTOR) 20 MG tablet Take 1 tablet (20 mg total) by mouth daily. 03/09/21   Vivi Barrack, MD  sotalol (BETAPACE) 80 MG  tablet Take 1 tablet (80 mg total) by mouth every 12 (twelve) hours. 05/25/21   Vivi Barrack, MD  timolol (TIMOPTIC) 0.25 % ophthalmic solution Place 1 drop into both eyes daily. 02/22/19   [provider]      Allergies    Patient has no known allergies.    Review of Systems   Review of Systems  Constitutional:  Positive for fatigue. Negative for fever.  Gastrointestinal:  Negative for diarrhea and vomiting.  Neurological:  Positive for weakness. Negative for headaches.   Physical Exam Updated Vital Signs BP (!) 184/60    Pulse 66    Temp 97.7 F (36.5 C) (Oral)    Resp 20    Ht 1.651 m (5\' 5" )    Wt 57 kg    SpO2 97%    BMI 20.91 kg/m  Physical Exam  ED Results / Procedures / Treatments   Labs (all labs ordered are listed, but only abnormal results are displayed) Labs Reviewed  COMPREHENSIVE METABOLIC PANEL - Abnormal; Notable for the following components:      Result Value   Sodium 130 (*)    Chloride 97 (*)    Glucose, Bld 172 (*)    BUN 7 (*)    All other components within normal limits  URINALYSIS, ROUTINE W REFLEX MICROSCOPIC - Abnormal; Notable for  the following components:   Specific Gravity, Urine <1.005 (*)    Hgb urine dipstick SMALL (*)    Leukocytes,Ua TRACE (*)    All other components within normal limits  URINALYSIS, MICROSCOPIC (REFLEX) - Abnormal; Notable for the following components:   Bacteria, UA RARE (*)    All other components within normal limits  CBG MONITORING, ED - Abnormal; Notable for the following components:   Glucose-Capillary 103 (*)    All other components within normal limits  RESP PANEL BY RT-PCR (FLU A&B, COVID) [id number]  CBC  TROPONIN I (HIGH SENSITIVITY)    EKG EKG Interpretation  Date/Time:  Monday September 19 2021 11:30:51 EST Ventricular Rate:  71 PR Interval:  226 QRS Duration: 74 QT Interval:  414 QTC Calculation: 449 R Axis:   79 Text Interpretation: Sinus rhythm with 1st degree A-V block Nonspecific ST  abnormality Confirmed by Lajean Saver [id number]) on 09/19/2021 1:57:00 PM  Radiology CT Head Wo Contrast  Result Date: 09/19/2021 CLINICAL DATA:  Mental status changes, ongoing weakness and failure to thrive, loss of appetite, diarrhea, history COPD, hypertension EXAM: CT HEAD WITHOUT CONTRAST TECHNIQUE: Contiguous axial images were obtained from the base of the skull through the vertex without intravenous contrast. RADIATION DOSE REDUCTION: This exam was performed according to the departmental dose-optimization program which includes automated exposure control, adjustment of the mA and/or kV according to patient size and/or use of iterative reconstruction technique. COMPARISON:  09/13/2021 FINDINGS: Brain: Generalized atrophy, greater posteriorly. Chronic LEFT cerebellar atrophy and prominence of CSF at LEFT posterior fossa. No midline shift or mass effect. Small vessel chronic ischemic changes of deep cerebral white matter. No intracranial hemorrhage, mass lesion or evidence of acute infarction. No extra-axial collection. Scattered nonspecific dural calcification. Vascular: Atherosclerotic calcifications of internal carotid and vertebral arteries at skull base Skull: Intact Sinuses/Orbits: Chronic opacification of LEFT sphenoid sinus. Nasal septal deviation to the LEFT. Other: N/A IMPRESSION: Atrophy with small vessel chronic ischemic changes of deep cerebral white matter. No acute intracranial abnormalities. Electronically Signed   By: Lavonia Dana M.D.   On: 09/19/2021 13:18    Procedures Procedures    Medications Ordered in ED Medications  sodium chloride 0.9 % bolus 500 mL (500 mLs Intravenous New Bag/Given 09/19/21 1547)    Followed by  0.9 %  sodium chloride infusion (1,000 mLs Intravenous New Bag/Given 09/19/21 1548)    ED Course/ Medical Decision Making/ A&P Clinical Course as of 09/20/21 2354  Mon Sep 19, 2021  1458 Comprehensive metabolic panel(!) Hyponatremia noted, similar to previous  values [JK]  1459 CBC NOrmal [JK]  1459 Head CT images and report reviewed.  No acute findings. [JK]  1501 Reviewed outside records, recent mri 2/1 [JK]  1502 NOtes reviewed from Dr's office visit today  [JK]  1507 Bp 148/31 at bedside. [id number] Reviewed neurology consult note on 2/1. [JK]  1640 DIscussed with nor Palikh.  Agrees with admission further workup.  No repeat MRI at this time. [id number] Case discussed with hospitalist service regarding admission [JK]    Clinical Course User Index [JK] Dorie Rank, MD                           Medical Decision Making Amount and/or Complexity of Data Reviewed Labs:  Decision-making details documented in ED Course.  Risk Prescription drug management. Decision regarding hospitalization.   Confusion Patient having episodes of confusion and word finding difficulties.  Symptoms  have been intermittent and ongoing since the end of January.  Patient was seen on January 31 and diagnosed with TIA, hypertensive urgency.  Admission was recommended at that time patient declined.  Patient was once again seen on February 1.  Admission again was recommended but patient declined.  Patient has been seen in the office now on February 2 and today the symptoms continue.  Patient has had her thyroid levels checked.  Her folic acid and [id number] have been checked and those are normal.  Patient also had an MRI of the brain in February 1.  Only lab abnormality noted is hyponatremia.  She is trending downward however patient has had hyponatremia in the past.  Difficult to say this is a definite answer.  Her sodium level was 135 5 months ago and 10 months ago was 133.  I will consult with neurology to discuss further workup  HTN BP has been labile.  Question if that could be contributing.  Sx ongoing since end of January.  SHe could have evolved from TIA episodes to stroke since she has had recurrence.         Final Clinical Impression(s) / ED Diagnoses Final  diagnoses:  Weakness  Difficulty with speech  Hyponatremia     Dorie Rank, MD 09/20/21 2355

## 2021-09-20 ENCOUNTER — Other Ambulatory Visit (HOSPITAL_COMMUNITY): Payer: Medicare Other

## 2021-09-20 ENCOUNTER — Inpatient Hospital Stay (HOSPITAL_COMMUNITY): Payer: Medicare Other

## 2021-09-20 DIAGNOSIS — I361 Nonrheumatic tricuspid (valve) insufficiency: Secondary | ICD-10-CM

## 2021-09-20 DIAGNOSIS — R531 Weakness: Secondary | ICD-10-CM

## 2021-09-20 DIAGNOSIS — J449 Chronic obstructive pulmonary disease, unspecified: Secondary | ICD-10-CM

## 2021-09-20 DIAGNOSIS — R051 Acute cough: Secondary | ICD-10-CM

## 2021-09-20 DIAGNOSIS — I351 Nonrheumatic aortic (valve) insufficiency: Secondary | ICD-10-CM | POA: Diagnosis not present

## 2021-09-20 LAB — RPR: RPR Ser Ql: NONREACTIVE

## 2021-09-20 LAB — CBC
HCT: 36.1 % (ref 36.0–46.0)
Hemoglobin: 11.4 g/dL — ABNORMAL LOW (ref 12.0–15.0)
MCH: 31.8 pg (ref 26.0–34.0)
MCHC: 31.6 g/dL (ref 30.0–36.0)
MCV: 100.8 fL — ABNORMAL HIGH (ref 80.0–100.0)
Platelets: 183 10*3/uL (ref 150–400)
RBC: 3.58 MIL/uL — ABNORMAL LOW (ref 3.87–5.11)
RDW: 12.2 % (ref 11.5–15.5)
WBC: 6.9 10*3/uL (ref 4.0–10.5)
nRBC: 0 % (ref 0.0–0.2)

## 2021-09-20 LAB — ECHOCARDIOGRAM COMPLETE
Area-P 1/2: 3.17 cm2
Height: 65 in
P 1/2 time: 361 msec
S' Lateral: 2.2 cm
Weight: 2010.6 oz

## 2021-09-20 LAB — COMPREHENSIVE METABOLIC PANEL
ALT: 17 U/L (ref 0–44)
AST: 21 U/L (ref 15–41)
Albumin: 3.2 g/dL — ABNORMAL LOW (ref 3.5–5.0)
Alkaline Phosphatase: 60 U/L (ref 38–126)
Anion gap: 10 (ref 5–15)
BUN: 7 mg/dL — ABNORMAL LOW (ref 8–23)
CO2: 21 mmol/L — ABNORMAL LOW (ref 22–32)
Calcium: 9.3 mg/dL (ref 8.9–10.3)
Chloride: 107 mmol/L (ref 98–111)
Creatinine, Ser: 0.52 mg/dL (ref 0.44–1.00)
GFR, Estimated: >60 mL/min (ref >60)
Glucose, Bld: 104 mg/dL — ABNORMAL HIGH (ref 70–99)
Potassium: 3.1 mmol/L — ABNORMAL LOW (ref 3.5–5.1)
Sodium: 138 mmol/L (ref 135–145)
Total Bilirubin: 0.4 mg/dL (ref 0.3–1.2)
Total Protein: 5.5 g/dL — ABNORMAL LOW (ref 6.5–8.1)

## 2021-09-20 LAB — NA AND K (SODIUM & POTASSIUM), RAND UR
Potassium Urine: 11 mmol/L
Sodium, Ur: 71 mmol/L

## 2021-09-20 LAB — LIPID PANEL
Cholesterol: 116 mg/dL (ref 0–200)
HDL: 45 mg/dL (ref >40)
LDL Cholesterol: 51 mg/dL (ref 0–99)
Total CHOL/HDL Ratio: 2.6 RATIO
Triglycerides: 99 mg/dL (ref <150)
VLDL: 20 mg/dL (ref 0–40)

## 2021-09-20 LAB — OSMOLALITY: Osmolality: 283 mOsm/kg (ref 275–295)

## 2021-09-20 LAB — OSMOLALITY, URINE: Osmolality, Ur: 229 mOsm/kg — ABNORMAL LOW (ref 300–900)

## 2021-09-20 LAB — HEMOGLOBIN A1C
Hgb A1c MFr Bld: 5.8 % — ABNORMAL HIGH (ref 4.8–5.6)
Mean Plasma Glucose: 119.76 mg/dL

## 2021-09-20 LAB — BRAIN NATRIURETIC PEPTIDE: B Natriuretic Peptide: 255.2 pg/mL — ABNORMAL HIGH (ref 0.0–100.0)

## 2021-09-20 LAB — MAGNESIUM: Magnesium: 1.8 mg/dL (ref 1.7–2.4)

## 2021-09-20 IMAGING — DX DG CHEST 1V PORT
1 series · 1 of 1 positions shown · non-contrast
Comparison: [DATE]

CLINICAL DATA: Dyspnea.  History COPD

EXAM:
PORTABLE CHEST 1 VIEW

[chest]
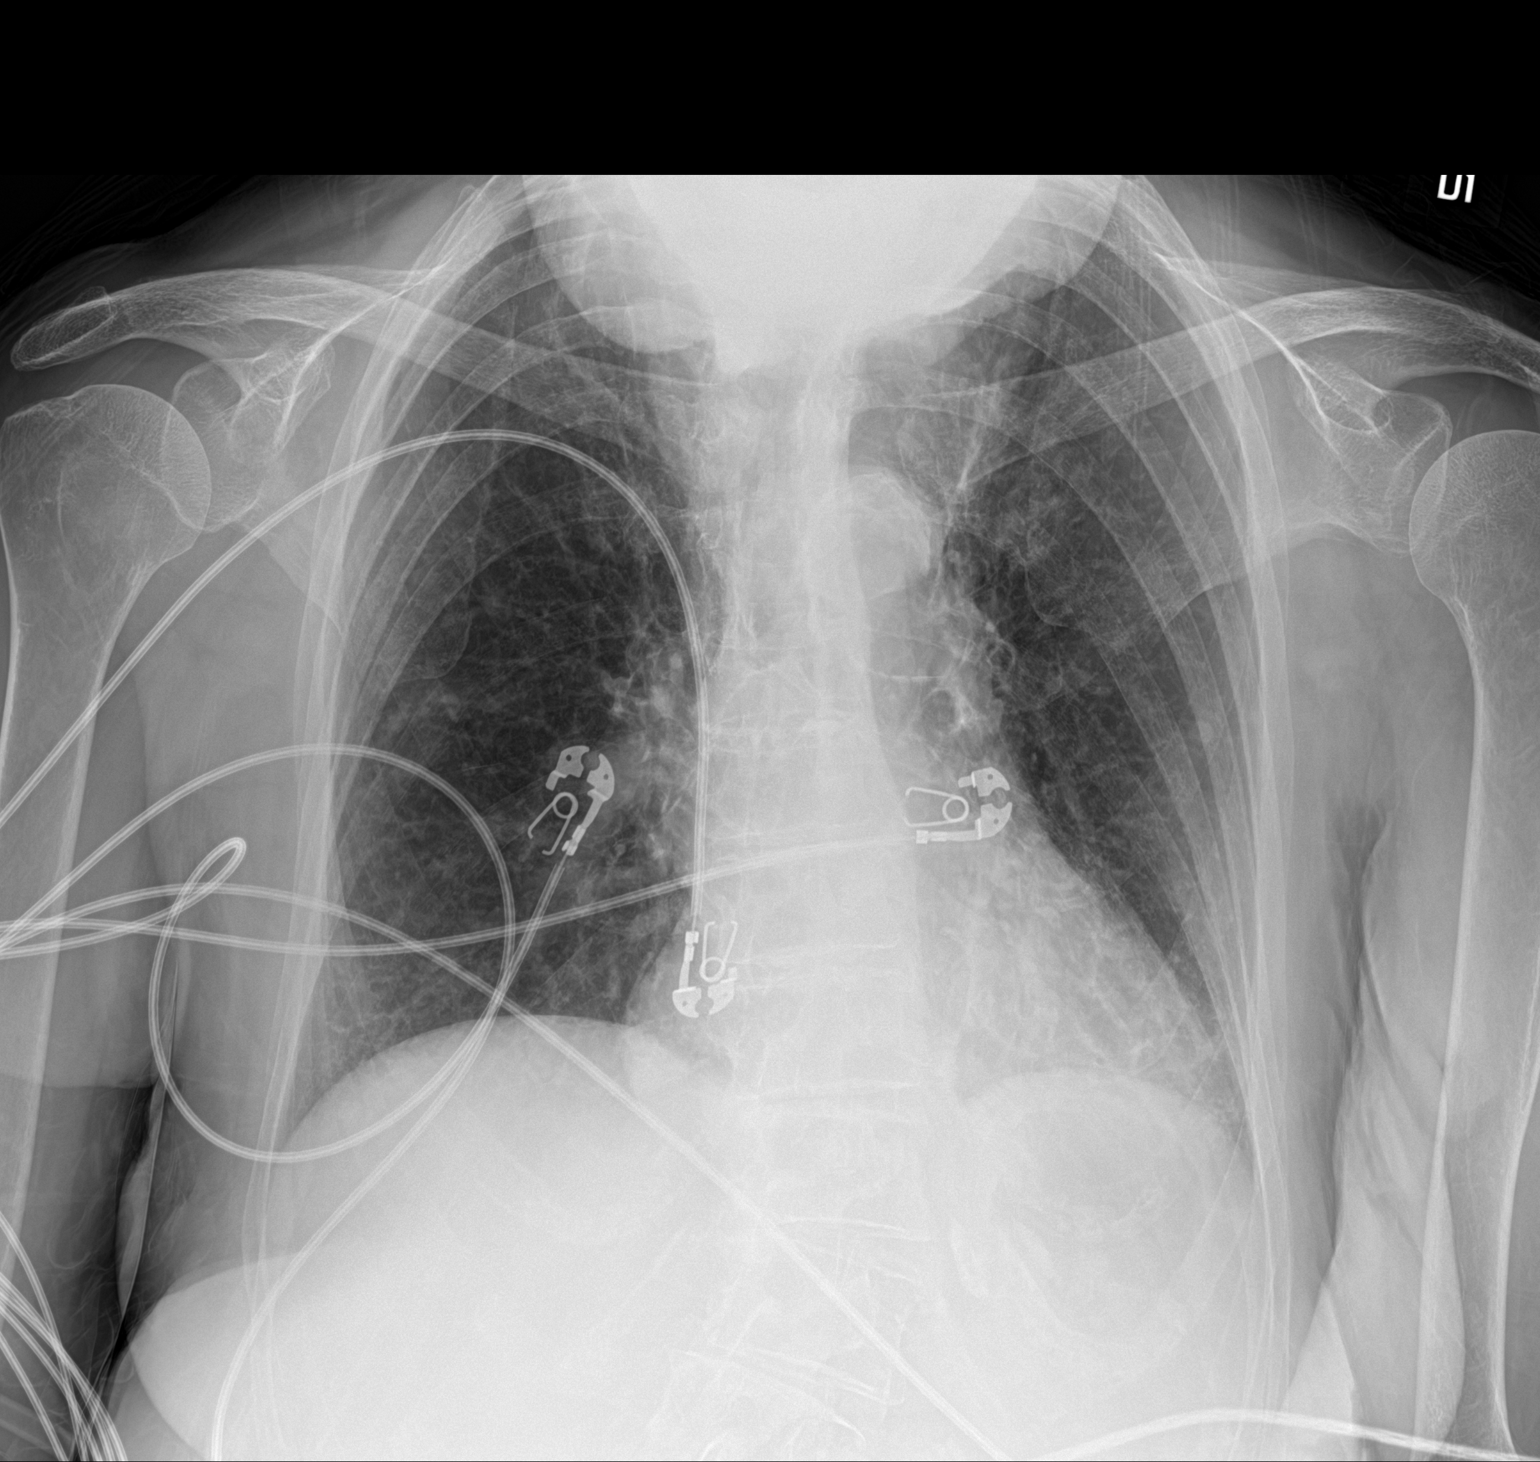

[1 of 1 positions shown; findings below may reference images not displayed]

FINDINGS: Heart size and vascularity within normal limits. Atherosclerotic
calcification thoracic aorta.

Lungs are clear without infiltrate or effusion.
IMPRESSION: No active disease.

## 2021-09-20 MED ORDER — ACETAMINOPHEN 500 MG PO TABS
1000.0000 mg | ORAL_TABLET | Freq: Four times a day (QID) | ORAL | Status: DC | PRN
  Administered 2021-09-20 – 2021-09-23 (×7): 1000 mg via ORAL
  Filled 2021-09-20 (×7): qty 2

## 2021-09-20 MED ORDER — AMLODIPINE BESYLATE 5 MG PO TABS
5.0000 mg | ORAL_TABLET | Freq: Every day | ORAL | Status: DC
  Administered 2021-09-20 – 2021-09-23 (×4): 5 mg via ORAL
  Filled 2021-09-20 (×4): qty 1

## 2021-09-20 MED ORDER — HYDRALAZINE HCL 20 MG/ML IJ SOLN
10.0000 mg | Freq: Three times a day (TID) | INTRAMUSCULAR | Status: DC | PRN
  Administered 2021-09-20: 10 mg via INTRAVENOUS
  Filled 2021-09-20: qty 1

## 2021-09-20 MED ORDER — DM-GUAIFENESIN ER 30-600 MG PO TB12
1.0000 | ORAL_TABLET | Freq: Two times a day (BID) | ORAL | Status: DC
  Administered 2021-09-20 – 2021-09-23 (×7): 1 via ORAL
  Filled 2021-09-20 (×8): qty 1

## 2021-09-20 MED ORDER — IPRATROPIUM-ALBUTEROL 0.5-2.5 (3) MG/3ML IN SOLN
3.0000 mL | Freq: Two times a day (BID) | RESPIRATORY_TRACT | Status: DC
  Administered 2021-09-21 – 2021-09-22 (×4): 3 mL via RESPIRATORY_TRACT
  Filled 2021-09-20 (×4): qty 3

## 2021-09-20 MED ORDER — IPRATROPIUM-ALBUTEROL 0.5-2.5 (3) MG/3ML IN SOLN
3.0000 mL | Freq: Four times a day (QID) | RESPIRATORY_TRACT | Status: DC
  Administered 2021-09-20 (×2): 3 mL via RESPIRATORY_TRACT
  Filled 2021-09-20: qty 3

## 2021-09-20 MED ORDER — POTASSIUM CHLORIDE CRYS ER 20 MEQ PO TBCR
40.0000 meq | EXTENDED_RELEASE_TABLET | Freq: Once | ORAL | Status: AC
  Administered 2021-09-20: 40 meq via ORAL
  Filled 2021-09-20: qty 2

## 2021-09-20 MED ORDER — SODIUM CHLORIDE 0.9 % IV SOLN
500.0000 mg | INTRAVENOUS | Status: DC
  Administered 2021-09-20: 500 mg via INTRAVENOUS
  Filled 2021-09-20 (×2): qty 5

## 2021-09-20 NOTE — Progress Notes (Signed)
PT Cancellation Note  Patient Details Name: Linda [name] MRN: [id number] DOB: [birth year]   Cancelled Treatment:    Reason Eval/Treat Not Completed: Medical issues which prohibited therapy.  Pt is quite hypertensive with no findings of neuro event yet.  SOB, very limited.  Will retry PT when she is more tolerant of movement and medically stable.   Ramond Dial 09/20/2021, 10:30 AM  Mee Hives, PT PhD Acute Rehab Dept. Number: [place] [facility] [place]

## 2021-09-20 NOTE — Progress Notes (Signed)
PROGRESS NOTE  Linda [name] [id number] DOB: 10-Oct-1923 DOA: 09/19/2021 PCP: Ardith Dark, MD  HPI/Recap of past 24 hours: Linda [name] is a 88 y.o. female with medical history significant of HTN, HLD, COPD, glaucoma presents to the ED with multiple complaints including intermittent/recurrent confusion, difficulty finding words, poor appetite, generalized weakness and a sensation of not feeling good that has been ongoing for the past 1 week.  Of note, patient first presented to the ED on 09/13/2021, at that time SBP noted to be in the 200s, with similar complaints, MRI and CT scans done at that time were negative for acute stroke. Discussions about admission for possible hypertensive encephalopathy was discussed with patient and the neurology consultation, but patient left AMA.  Patient returned to the ED on 09/14/2021 again with similar complaints, recommended for admission for further eval, pt declined and signed AMA again. On 09/15/21 pt followed up with PCP and basic lab work was done, reported resolved diarrhea. On 09/19/21, pt went to PCP again still with similar complaints and was advised to go to the ED. According to her daughter, patient was able to perform her ADLs in an independent living facility, ambulates with a rollator and was at her baseline up until 09/13/2021. Reports some productive cough of whitish sputum which has been ongoing for the past couple of days. In the ED, vital signs fairly stable except for some SBP's in the 190s, labs unremarkable except for sodium of 130, chloride 97, UA showed small hemoglobin, trace leukocytes, SG less than 1.005, rare bacteria.  CT head with no acute intracranial abnormalities.  EDP spoke to neurology, recommended admission and they will consult.  Patient admitted for further management.     Today, patient noted to be very anxious, worried about her worsening cough, unable to bring any sputum out, reporting some shortness of breath, and generalized  weakness.  Very anxious.  Daughter at bedside, discussed plan of care.    Assessment/Plan: Principal Problem:   Altered mental status   Recurrent/intermittent altered mental status Acute metabolic encephalopathy R/O CVA/TIA Currently, unknown etiology ?Hypertensive encephalopathy Vs TIA/CVA, Vs ?early phase of mild cognitive impairment CT head and MRI brain (09/14/21) unremarkable for any acute intracranial abnormality CTA head and neck, with MRI brain pending (patient reports unable to lay flat due to cough and difficulty breathing) Echo showed EF of 70 to 75%, grade 2 diastolic dysfunction, with moderately elevated pulmonary artery systolic pressure 58.4 mmHG Rule out any infectious causes, UA with trace leukocytes, rare bacteria, urine culture on 09/15/2021 no growth Chest x-ray X 2, no active disease EEG pending result Vit B12 369, RPR non-reactive Neurology consulted, appreciate recs  A1c, 5.8, LDL 51 Telemetry, frequent neurochecks PT/OT/SLP- SNF  Chronic cough/Dyspnea ??Pulmonary hypertension History of COPD/bronchiectasis Noted to be having nonproductive cough Chest x-ray x2 with no active disease BNP 255 Echo with moderately elevated pulmonary artery systolic pressure 58.4 mmHG (just seeing echo report, may need cardiology consult as this may be contributing to her dyspnea) Started on azithromycin for bronchiectasis flareup DuoNebs, Mucinex for cough Supplemental O2 as needed  Hyponatremia Improved Likely 2/2 poor oral intake S/P IV fluids Daily BMP  Hypokalemia Replace as needed   Hypertension BP noted to be uncontrolled Continue home sotalol, started on amlodipine IV hydralazine as needed   Hyperlipidemia LDL 51 Continue Crestor  Normocytic anemia Drop in hemoglobin likely due to hemodilution Daily CBC   Glaucoma Continue timolol eyedrops       Estimated body mass  index is 20.91 kg/m as calculated from the following:   Height as of this  encounter: 5\' 5"  (1.651 m).   Weight as of this encounter: 57 kg.     Code Status: DNR  Family Communication: Discussed with daughter at bedside  Disposition Plan: Status is: Inpatient Remains inpatient appropriate because: Level of care     Consultants: Neurology  Procedures: None  Antimicrobials: Azithromycin  DVT prophylaxis: Lovenox   Objective: Vitals:   09/20/21 0815 09/20/21 1000 09/20/21 1300 09/20/21 1620  BP: (!) 224/89 (!) 157/62 127/68 (!) 155/81  Pulse: 73 80 72 70  Resp: 16 (!) 24 18 18   Temp:      TempSrc:      SpO2: 97% 96% 99% 98%  Weight:      Height:       No intake or output data in the 24 hours ending 09/20/21 1744 Filed Weights   09/19/21 1128  Weight: 57 kg    [name]: General: NAD, alert, oriented, very anxious Cardiovascular: S1, S2 present Respiratory: CTAB Abdomen: Soft, nontender, nondistended, bowel sounds present Musculoskeletal: No bilateral pedal edema noted Skin: Normal Psychiatry: Anxious mood     Data Reviewed: CBC: Recent Labs  Lab 09/13/21 2317 09/13/21 2322 09/14/21 1512 09/15/21 1629 09/19/21 1131 09/20/21 0338  WBC 8.9  --  8.5 9.0 7.3 6.9  NEUTROABS 6.7  --  6.4  --   --   --   HGB 14.6 15.3* 13.3 13.2 14.2 11.4*  HCT 44.5 45.0 41.0 39.6 42.7 36.1  MCV 98.9  --  98.1 96.3 97.9 100.8*  PLT 204  --  232 252.0 243 183   Basic Metabolic Panel: Recent Labs  Lab 09/13/21 2317 09/13/21 2322 09/14/21 1512 09/15/21 1629 09/19/21 1131 09/20/21 0338  NA 134* 136 133* 130* 130* 138  K 4.1 4.1 4.0 4.3 3.5 3.1*  CL 100 102 99 94* 97* 107  CO2 23  --  22 29 23  21*  GLUCOSE 132* 126* 120* 108* 172* 104*  BUN 12 13 11 20  7* 7*  CREATININE 0.66 0.60 0.71 0.75 0.69 0.52  CALCIUM 10.5*  --  10.3 10.7* 10.1 9.3  MG  --   --   --   --   --  1.8   GFR: Estimated Creatinine Clearance: 37.9 mL/min (by C-G formula based on SCr of 0.52 mg/dL). Liver Function Tests: Recent Labs  Lab 09/13/21 2317 09/15/21 1629  09/19/21 1131 09/20/21 0338  AST 43* 38* 29 21  ALT 30 22 20 17   ALKPHOS 116 94 83 60  BILITOT 0.6 0.6 0.5 0.4  PROT 7.7 7.4 7.2 5.5*  ALBUMIN 4.1 4.3 3.9 3.2*   No results for input(s): LIPASE, AMYLASE in the last 168 hours. No results for input(s): AMMONIA in the last 168 hours. Coagulation Profile: Recent Labs  Lab 09/13/21 2317  INR 1.0   Cardiac Enzymes: No results for input(s): CKTOTAL, CKMB, CKMBINDEX, TROPONINI in the last 168 hours. BNP (last 3 results) No results for input(s): PROBNP in the last 8760 hours. HbA1C: Recent Labs    09/20/21 0338  HGBA1C 5.8*   CBG: Recent Labs  Lab 09/13/21 2309 09/19/21 1541  GLUCAP 117*   117* 103*   Lipid Profile: Recent Labs    09/20/21 0338  CHOL 116  HDL 45  LDLCALC 51  TRIG 99  CHOLHDL 2.6   Thyroid Function Tests: No results for input(s): TSH, T4TOTAL, FREET4, T3FREE, THYROIDAB in the last 72 hours. Anemia Panel:  No results for input(s): VITAMINB12, FOLATE, FERRITIN, TIBC, IRON, RETICCTPCT in the last 72 hours. Urine analysis:    Component Value Date/Time   COLORURINE YELLOW 09/19/2021 1528   APPEARANCEUR CLEAR 09/19/2021 1528   LABSPEC <1.005 (L) 09/19/2021 1528   PHURINE 5.5 09/19/2021 1528   GLUCOSEU NEGATIVE 09/19/2021 1528   GLUCOSEU NEGATIVE 11/18/2020 1218   HGBUR SMALL (A) 09/19/2021 1528   BILIRUBINUR NEGATIVE 09/19/2021 1528   KETONESUR NEGATIVE 09/19/2021 1528   PROTEINUR NEGATIVE 09/19/2021 1528   UROBILINOGEN 0.2 11/18/2020 1218   NITRITE NEGATIVE 09/19/2021 1528   LEUKOCYTESUR TRACE (A) 09/19/2021 1528   Sepsis Labs: @LABRCNTIP (procalcitonin:4,lacticidven:4)  ) Recent Results (from the past 240 hour(s))  Resp Panel by RT-PCR (Flu A&B, Covid) Nasopharyngeal Swab     Status: None   Collection Time: 09/13/21 10:52 PM   Specimen: Nasopharyngeal Swab; Nasopharyngeal(NP) swabs in vial transport medium  Result Value Ref Range Status   SARS Coronavirus 2 by RT PCR NEGATIVE NEGATIVE Final     Comment: (NOTE) SARS-CoV-2 target nucleic acids are NOT DETECTED.  The SARS-CoV-2 RNA is generally detectable in upper respiratory specimens during the acute phase of infection. The lowest concentration of SARS-CoV-2 viral copies this assay can detect is 138 copies/mL. A negative result does not preclude SARS-Cov-2 infection and should not be used as the sole basis for treatment or other patient management decisions. A negative result may occur with  improper specimen collection/handling, submission of specimen other than nasopharyngeal swab, presence of viral mutation(s) within the areas targeted by this assay, and inadequate number of viral copies(<138 copies/mL). A negative result must be combined with clinical observations, patient history, and epidemiological information. The expected result is Negative.  Fact Sheet for Patients:  09/15/21  Fact Sheet for Healthcare Providers:  BloggerCourse.com  This test is no t yet approved or cleared by the [place] FDA and  has been authorized for detection and/or diagnosis of SARS-CoV-2 by FDA under an Emergency Use Authorization (EUA). This EUA will remain  in effect (meaning this test can be used) for the duration of the COVID-19 declaration under Section 564(b)(1) of the Act, 21 U.S.C.section 360bbb-3(b)(1), unless the authorization is terminated  or revoked sooner.       Influenza A by PCR NEGATIVE NEGATIVE Final   Influenza B by PCR NEGATIVE NEGATIVE Final    Comment: (NOTE) The Xpert Xpress SARS-CoV-2/FLU/RSV plus assay is intended as an aid in the diagnosis of influenza from Nasopharyngeal swab specimens and should not be used as a sole basis for treatment. Nasal washings and aspirates are unacceptable for Xpert Xpress SARS-CoV-2/FLU/RSV testing.  Fact Sheet for Patients: [place]  Fact Sheet for Healthcare  Providers: BloggerCourse.com  This test is not yet approved or cleared by the [employer] FDA and has been authorized for detection and/or diagnosis of SARS-CoV-2 by FDA under an Emergency Use Authorization (EUA). This EUA will remain in effect (meaning this test can be used) for the duration of the COVID-19 declaration under Section 564(b)(1) of the Act, 21 U.S.C. section 360bbb-3(b)(1), unless the authorization is terminated or revoked.  Performed at [facility], [address] [place], [place]   Urine Culture     Status: None   Collection Time: 09/15/21  4:29 PM   Specimen: Urine  Result Value Ref Range Status   MICRO NUMBER: 11/13/21  Final   SPECIMEN QUALITY: Adequate  Final   Sample Source NOT GIVEN  Final   STATUS: FINAL  Final  Result: No Growth  Final  Resp Panel by RT-PCR (Flu A&B, Covid) Nasopharyngeal Swab     Status: None   Collection Time: 09/19/21  1:59 PM   Specimen: Nasopharyngeal Swab; Nasopharyngeal(NP) swabs in vial transport medium  Result Value Ref Range Status   SARS Coronavirus 2 by RT PCR NEGATIVE NEGATIVE Final    Comment: (NOTE) SARS-CoV-2 target nucleic acids are NOT DETECTED.  The SARS-CoV-2 RNA is generally detectable in upper respiratory specimens during the acute phase of infection. The lowest concentration of SARS-CoV-2 viral copies this assay can detect is 138 copies/mL. A negative result does not preclude SARS-Cov-2 infection and should not be used as the sole basis for treatment or other patient management decisions. A negative result may occur with  improper specimen collection/handling, submission of specimen other than nasopharyngeal swab, presence of viral mutation(s) within the areas targeted by this assay, and inadequate number of viral copies(<138 copies/mL). A negative result must be combined with clinical observations, patient history, and epidemiological information. The expected  result is Negative.  Fact Sheet for Patients:  BloggerCourse.com  Fact Sheet for Healthcare Providers:  [employer]ious[employer]ker.it  This test is no t yet approved or cleared by the [place] FDA and  has been authorized for detection and/or diagnosis of SARS-CoV-2 by FDA under an Emergency Use Authorization (EUA). This EUA will remain  in effect (meaning this test can be used) for the duration of the COVID-19 declaration under Section 564(b)(1) of the Act, 21 U.S.C.section 360bbb-3(b)(1), unless the authorization is terminated  or revoked sooner.       Influenza A by PCR NEGATIVE NEGATIVE Final   Influenza B by PCR NEGATIVE NEGATIVE Final    Comment: (NOTE) The Xpert Xpress SARS-CoV-2/FLU/RSV plus assay is intended as an aid in the diagnosis of influenza from Nasopharyngeal swab specimens and should not be used as a sole basis for treatment. Nasal washings and aspirates are unacceptable for Xpert Xpress SARS-CoV-2/FLU/RSV testing.  Fact Sheet for Patients: BloggerCourse.com  Fact Sheet for Healthcare Providers: [employer]ious[employer]ker.it  This test is not yet approved or cleared by the [place] FDA and has been authorized for detection and/or diagnosis of SARS-CoV-2 by FDA under an Emergency Use Authorization (EUA). This EUA will remain in effect (meaning this test can be used) for the duration of the COVID-19 declaration under Section 564(b)(1) of the Act, 21 U.S.C. section 360bbb-3(b)(1), unless the authorization is terminated or revoked.  Performed at [facility], [address], [place], [place] [address]       Studies: DG Chest Port 1 View  Result Date: 09/20/2021 CLINICAL DATA:  Dyspnea.  History COPD [name]: PORTABLE CHEST 1 VIEW COMPARISON:  09/19/2021 FINDINGS: Heart size and vascularity within normal limits. Atherosclerotic calcification thoracic aorta. Lungs are  clear without infiltrate or effusion. IMPRESSION: No active disease. Electronically Signed   By: Marlan Palau M.D.   On: 09/20/2021 13:12   DG Chest Port 1 View  Result Date: 09/19/2021 CLINICAL DATA:  Weakness, failure to thrive [name]: PORTABLE CHEST 1 VIEW COMPARISON:  09/14/2021 FINDINGS: Prominent superior mediastinum seen on prior CT to reflect substernal thyroid goiter. Heart is borderline in size. Aortic atherosclerosis. There is hyperinflation of the lungs compatible with COPD. Biapical scarring. No acute confluent opacities or effusions. No acute bony abnormality. IMPRESSION: COPD.  Chronic changes. No active disease. Electronically Signed   By: Charlett Nose M.D.   On: 09/19/2021 19:22   ECHOCARDIOGRAM COMPLETE  Result Date: 09/20/2021    ECHOCARDIOGRAM  REPORT   Patient Name:   Linda [name]: 09/20/2021 Medical Rec #:  [id number]     Height:       65.0 in Accession #:    [id number]    Weight:       125.7 lb Date of Birth:  12-01-1923     BSA:          1.624 m Patient Age:    89 years      BP:           127/68 mmHg Patient Gender: F             HR:           72 bpm. [name] Location:  Inpatient Procedure: 2D Echo Indications:    TIA  History:        Patient has prior history of Echocardiogram examinations, most                 recent 09/30/2019. Signs/Symptoms:Altered Mental Status; Risk                 Factors:Hypertension.  Sonographer:    Delcie RochLauren Pennington RDCS Referring Phys: [id number] Warrick ParisianNKEIRUKA J Braley Luckenbaugh IMPRESSIONS  1. Left ventricular ejection fraction, by estimation, is 70 to 75%. The left ventricle has hyperdynamic function. The left ventricle has no regional wall motion abnormalities. Left ventricular diastolic parameters are consistent with Grade II diastolic dysfunction (pseudonormalization). Elevated left atrial pressure.  2. Right ventricular systolic function is normal. The right ventricular size is normal. There is moderately elevated pulmonary artery systolic pressure. The  estimated right ventricular systolic pressure is 58.4 mmHg.  3. The mitral valve is normal in structure. Trivial mitral valve regurgitation.  4. Tricuspid valve regurgitation is mild to moderate.  5. The aortic valve is tricuspid. There is mild calcification of the aortic valve. Aortic valve regurgitation is mild. Aortic valve sclerosis is present, with no evidence of aortic valve stenosis.  6. The inferior vena cava is normal in size with <50% respiratory variability, suggesting right atrial pressure of 8 mmHg. Comparison(s): Prior images reviewed side by side. The left ventricular diastolic function is significantly worse. There is now evidence of increased mean left atrial pressure, otherwise no significant change. FINDINGS  Left Ventricle: Left ventricular ejection fraction, by estimation, is 70 to 75%. The left ventricle has hyperdynamic function. The left ventricle has no regional wall motion abnormalities. The left ventricular internal cavity size was normal in size. There is borderline concentric left ventricular hypertrophy. Left ventricular diastolic parameters are consistent with Grade II diastolic dysfunction (pseudonormalization). Elevated left atrial pressure. Right Ventricle: The right ventricular size is normal. No increase in right ventricular wall thickness. Right ventricular systolic function is normal. There is moderately elevated pulmonary artery systolic pressure. The tricuspid regurgitant velocity is 3.55 m/s, and with an assumed right atrial pressure of 8 mmHg, the estimated right ventricular systolic pressure is 58.4 mmHg. Left Atrium: Left atrial size was normal in size. Right Atrium: Right atrial size was normal in size. Pericardium: There is no evidence of pericardial effusion. Mitral Valve: The mitral valve is normal in structure. Trivial mitral valve regurgitation. Tricuspid Valve: The tricuspid valve is normal in structure. Tricuspid valve regurgitation is mild to moderate. Aortic  Valve: The aortic valve is tricuspid. There is mild calcification of the aortic valve. Aortic valve regurgitation is mild. Aortic regurgitation PHT measures 361 msec. Aortic valve sclerosis is present, with no evidence of aortic valve stenosis. Pulmonic Valve: The pulmonic  valve was not well visualized. Pulmonic valve regurgitation is not visualized. Aorta: The aortic root is normal in size and structure. Venous: The inferior vena cava is normal in size with less than 50% respiratory variability, suggesting right atrial pressure of 8 mmHg. IAS/Shunts: No atrial level shunt detected by color flow Doppler.  LEFT VENTRICLE PLAX 2D LVIDd:         3.70 cm   Diastology LVIDs:         2.20 cm   LV e' medial:    4.46 cm/s LV PW:         1.20 cm   LV E/e' medial:  22.6 LV IVS:        1.00 cm   LV e' lateral:   4.68 cm/s LVOT diam:     1.70 cm   LV E/e' lateral: 21.6 LV SV:         56 LV SV Index:   34 LVOT Area:     2.27 cm  RIGHT VENTRICLE             IVC RV S prime:     15.10 cm/s  IVC diam: 1.90 cm TAPSE (M-mode): 2.0 cm LEFT ATRIUM             Index        RIGHT ATRIUM           Index LA diam:        3.30 cm 2.03 cm/m   RA Area:     11.10 cm LA Vol (A2C):   47.6 ml 29.32 ml/m  RA Volume:   21.90 ml  13.49 ml/m LA Vol (A4C):   46.8 ml 28.82 ml/m LA Biplane Vol: 48.7 ml 30.00 ml/m  AORTIC VALVE LVOT Vmax:   118.00 cm/s LVOT Vmean:  79.800 cm/s LVOT VTI:    0.246 m AI PHT:      361 msec  AORTA Ao Root diam: 2.70 cm Ao Asc diam:  3.20 cm MITRAL VALVE                TRICUSPID VALVE MV Area (PHT): 3.17 cm     TR Peak grad:   50.4 mmHg MV Decel Time: 239 msec     TR Vmax:        355.00 cm/s MV E velocity: 101.00 cm/s MV A velocity: 116.00 cm/s  SHUNTS MV E/A ratio:  0.87         Systemic VTI:  0.25 m                             Systemic Diam: 1.70 cm Mihai Croitoru MD Electronically signed by Thurmon FairMihai Croitoru MD Signature Date/Time: 09/20/2021/3:17:25 PM    Final     Scheduled Meds:  aspirin EC  81 mg Oral Daily    dextromethorphan-guaiFENesin  1 tablet Oral BID   enoxaparin (LOVENOX) injection  40 mg Subcutaneous Q24H   famotidine  20 mg Oral Daily   ipratropium-albuterol  3 mL Nebulization Q6H   rosuvastatin  20 mg Oral Daily   sotalol  80 mg Oral Q12H   timolol  1 drop Both Eyes Daily    Continuous Infusions:  azithromycin 500 mg (09/20/21 1509)     LOS: 1 day     Briant CedarNkeiruka J Edmund Holcomb, MD [facility]  If 7PM-7AM, please contact night-coverage [email or web address] 09/20/2021, 5:44 PM

## 2021-09-20 NOTE — ED Notes (Signed)
Breakfast Orders Placed °

## 2021-09-20 NOTE — Progress Notes (Signed)
Please inform patient of the following:  Her sodium is a little low. This could possible explain some of her symptoms. Can we have her come back this week and recheck?  Her urine sample was negative for UTI. HEr calcium was elevated but similar to previous values.   Everything else is STABLE.

## 2021-09-20 NOTE — Procedures (Signed)
This is a routine inpatient EEG performed on: September 19, 2021  Clinical indication: Altered mental status   History: The patient is a 88 year old woman with a history of arthritis COPD glaucoma hypertension and hyperlipidemia presenting with word finding difficulties and confusion. Introduction: This is a routine inpatient EEG performed using the standard 10-20 system of electrode placement with 21 channels of EEG and a single channel of EKG monitoring.  Approximately 22:38 minutes of EEG captured.  Photic stimulation and hyperventilation were not performed.   Description of the record:  The awake background is continuous and symmetric characterized by  a well formed 8-9 hertz posterior dominant rhythm.   No stage II sleep architecture seen.  No seizures, epileptiform discharges, or evolving ictal patterns seen.   The single-channel EKG shows a heart rate in the 60 BPM range   Impression: Normal awake and drowsy only routine EEG. No seizures, epileptiform discharges, or evolving ictal patterns seen

## 2021-09-20 NOTE — ED Notes (Signed)
MD paged regarding hypertension 

## 2021-09-20 NOTE — ED Notes (Addendum)
Per RT, unable to complete CT scan as patient stating she is unable to lay down due to feeling like something is in her throat, reporting increased phlegm. Will call CT when patient is able to complete the scan.

## 2021-09-20 NOTE — Evaluation (Signed)
Physical Therapy Evaluation Patient Details Name: Linda [name] MRN: [id number] DOB: 07-Jul-1924 Today's Date: 09/20/2021  History of Present Illness  88 yo female with onset of low intake, weakness, FTT, diarrhea and confusion was admitted from [facility] on 2/6.  Pt is cleared for a stroke change, but had low Na+, uncontrolled HTN and possible encephalopathy.  PMHx:  COPD, HTN, atherosclerosis, glaucoma, osteoporosis, thyroid disease  Clinical Impression  Pt was seen for mobility on rollator and discussed her current limitations with daughter and pt.  Her limits with cognition and safety on the walker make her appropriate for short rehab stay as she is at a higher fall risk currently.  Desaturation on room air may make her a candidate for O2 use, but will work toward a more independent functional level.  Follow along with her to meet goals of acute PT, and focus on strength, balance and balance toward safer gait.       Recommendations for follow up therapy are one component of a multi-disciplinary discharge planning process, led by the attending physician.  Recommendations may be updated based on patient status, additional functional criteria and insurance authorization.  Follow Up Recommendations Skilled nursing-short term rehab (<3 hours/day)    Assistance Recommended at Discharge Frequent or constant Supervision/Assistance  Patient can return home with the following  A little help with walking and/or transfers;A little help with bathing/dressing/bathroom;Assistance with cooking/housework;Assist for transportation    Equipment Recommendations Rollator (4 wheels)  Recommendations for Other Services       Functional Status Assessment Patient has had a recent decline in their functional status and demonstrates the ability to make significant improvements in function in a reasonable and predictable amount of time.     Precautions / Restrictions Precautions Precautions: Fall Precaution Comments:  HOH, wearing hearing devices Restrictions Weight Bearing Restrictions: No      Mobility  Bed Mobility Overal bed mobility: Needs Assistance Bed Mobility: Supine to Sit, Sit to Supine     Supine to sit: Min guard Sit to supine: Min guard        Transfers Overall transfer level: Needs assistance Equipment used: Rollator (4 wheels) Transfers: Sit to/from Stand Sit to Stand: Min guard           General transfer comment: min guard to stand safely, minor impulsivity    Ambulation/Gait Ambulation/Gait assistance: Min guard Gait Distance (Feet): 35 Feet Assistive device: Rollator (4 wheels) Gait Pattern/deviations: Step-through pattern, Decreased stride length, Trunk flexed Gait velocity: reduced Gait velocity interpretation: <1.31 ft/sec, indicative of household ambulator Pre-gait activities: standing balance assessment General Gait Details: Pt is getting up to walk on rollator in confined space, desaturated to go to bathroom but did manage to maintain to just walk a short trip on room air  Stairs            Wheelchair Mobility    Modified Rankin (Stroke Patients Only)       Balance Overall balance assessment: Needs assistance Sitting-balance support: Feet supported Sitting balance-Leahy Scale: Fair     Standing balance support: Bilateral upper extremity supported, During functional activity Standing balance-Leahy Scale: Poor                               Pertinent Vitals/Pain Pain Assessment Pain Assessment: No/denies pain    Home Living Family/patient expects to be discharged to:: Skilled nursing facility  Prior Function Prior Level of Function : Needs assist       Physical Assist : Mobility (physical) Mobility (physical): Gait   Mobility Comments: using a tripod rollator ADLs Comments: independent to bathe and dress     Hand Dominance   Dominant Hand: Right    Extremity/Trunk Assessment    Upper Extremity Assessment Upper Extremity Assessment: Defer to OT evaluation    Lower Extremity Assessment Lower Extremity Assessment: Generalized weakness;RLE deficits/detail RLE Deficits / Details: 4- strength generally RLE Coordination: decreased gross motor    Cervical / Trunk Assessment Cervical / Trunk Assessment: Kyphotic  Communication   Communication: HOH  Cognition Arousal/Alertness: Awake/alert Behavior During Therapy: WFL for tasks assessed/performed Overall Cognitive Status: Impaired/Different from baseline Area of Impairment: Memory, Attention, Orientation                 Orientation Level: Situation Current Attention Level: Selective Memory: Decreased short-term memory         General Comments: Pt is verbaliazing her issues of fogginess with thinking        General Comments General comments (skin integrity, edema, etc.): Pt is managing to maneuver with cues and assist, desaturating to 88% wth effort to use bathroom but short trips maintaining sats    Exercises     Assessment/Plan    PT Assessment Patient needs continued PT services  PT Problem List Decreased strength;Decreased range of motion;Decreased activity tolerance;Decreased balance;Decreased mobility;Decreased coordination;Decreased cognition;Decreased knowledge of use of DME;Decreased safety awareness       PT Treatment Interventions DME instruction;Gait training;Functional mobility training;Therapeutic activities;Therapeutic exercise;Balance training;Neuromuscular re-education;Patient/family education    PT Goals (Current goals can be found in the Care Plan section)  Acute Rehab PT Goals Patient Stated Goal: to be able to concentrate and walk again like usual PT Goal Formulation: With patient/family Time For Goal Achievement: 10/04/21 Potential to Achieve Goals: Good    Frequency Min 3X/week     Co-evaluation               AM-PAC PT "6 Clicks" Mobility  Outcome Measure  Help needed turning from your back to your side while in a flat bed without using bedrails?: A Little Help needed moving from lying on your back to sitting on the side of a flat bed without using bedrails?: A Little Help needed moving to and from a bed to a chair (including a wheelchair)?: A Little Help needed standing up from a chair using your arms (e.g., wheelchair or bedside chair)?: A Little Help needed to walk in hospital room?: A Little Help needed climbing 3-5 steps with a railing? : Total 6 Click Score: 16    End of Session   Activity Tolerance: Patient limited by fatigue;Treatment limited secondary to medical complications (Comment) Patient left: in bed;with call bell/phone within reach;with family/visitor present Nurse Communication: Mobility status PT Visit Diagnosis: Unsteadiness on feet (R26.81);Muscle weakness (generalized) (M62.81);Other abnormalities of gait and mobility (R26.89)    Time: 8299-3716 (+ 9678-9381) PT Time Calculation (min) (ACUTE ONLY): 24 min   Charges:   PT Evaluation $PT Eval Moderate Complexity: 1 Mod PT Treatments $Gait Training: 8-22 mins       Ivar Drape 09/20/2021, 5:31 PM  Samul Dada, PT PhD Acute Rehab Dept. Number: [facility] [id number] and [facility] [phone]

## 2021-09-20 NOTE — ED Notes (Signed)
Notified Dr. Sharolyn Douglas that patient unable to tolerate MRI due to difficulty laying flat and breathing difficulties.

## 2021-09-20 NOTE — ED Notes (Signed)
MD, MRI, CT all aware scans will be obtained in the morning, pt refusing scans during the night

## 2021-09-20 NOTE — Progress Notes (Signed)
°  Echocardiogram 2D Echocardiogram has been performed.  Johny Chess 09/20/2021, 3:23 PM

## 2021-09-20 NOTE — Evaluation (Signed)
Clinical/Bedside Swallow Evaluation Patient Details  Name: Tashae Inda MRN: [id number] Date of Birth: 05/28/1924  Today's Date: 09/20/2021 Time: SLP Start Time (ACUTE ONLY): 0940 SLP Stop Time (ACUTE ONLY): 0955 SLP Time Calculation (min) (ACUTE ONLY): 15 min  Past Medical History:  Past Medical History:  Diagnosis Date   Arthritis    COPD (chronic obstructive pulmonary disease) (HCC)    chronic cough   Glaucoma    Hyperlipidemia    Hypertension    Irregular heartbeat    Osteoporosis    Thyroid disease    enlarged thyroid   Past Surgical History:  Past Surgical History:  Procedure Laterality Date   APPENDECTOMY     cateract surgery     COLONOSCOPY     Done in [place]  yrs ago   UPPER GASTROINTESTINAL ENDOSCOPY  2019   WRIST SURGERY     HPI:  Jakalyn Kratky is a 88 y.o. female with medical history significant of HTN, HLD, COPD, glaucoma presents to the ED with multiple complaints including intermittent/recurrent confusion, difficulty finding words, poor appetite, generalized weakness and a sensation of not feeling good that has been ongoing for the past 1 week.  CT head negative for acute changes, CXR clear.    Assessment / Plan / Recommendation  Clinical Impression  Pt is very hard of hearing without HA present, making communication somewhat difficult. She is able to reports that she has not had difficulty eating or drinking. Her main concern is constant productive coughing, unrelated to PO intake. She denies GER or regurgitation. Under observation pt is coughing before, and well after sips of water, but not immediately after. There is no sign of swallowing impairment. Pt does not have a good appetite, but tolerates regular solids. Do not suspect pharyngeal dysphagia. Undiagnosed esophageal dysphagia is a possiblity, but would require dedicated esophageal assessment to evaluate. No further SLP work up needed at this time. Will sign off. SLP Visit Diagnosis: Dysphagia, unspecified  (R13.10)    Aspiration Risk  Mild aspiration risk    Diet Recommendation Regular;Thin liquid   Liquid Administration via: Cup;Straw Medication Administration: Whole meds with liquid Supervision: Patient able to self feed Postural Changes: Seated upright at 90 degrees    Other  Recommendations Oral Care Recommendations: Oral care BID    Recommendations for follow up therapy are one component of a multi-disciplinary discharge planning process, led by the attending physician.  Recommendations may be updated based on patient status, additional functional criteria and insurance authorization.  Follow up Recommendations No SLP follow up      Assistance Recommended at Discharge    Functional Status Assessment    Frequency and Duration            Prognosis        Swallow Study   General HPI: Tynia Wiers is a 88 y.o. female with medical history significant of HTN, HLD, COPD, glaucoma presents to the ED with multiple complaints including intermittent/recurrent confusion, difficulty finding words, poor appetite, generalized weakness and a sensation of not feeling good that has been ongoing for the past 1 week.  CT head negative for acute changes, CXR clear. Type of Study: Bedside Swallow Evaluation Previous Swallow Assessment: none Diet Prior to this Study: Regular;Thin liquids Temperature Spikes Noted: No Respiratory Status: Room air History of Recent Intubation: No Behavior/Cognition: Alert;Cooperative;Pleasant mood Oral Cavity Assessment: Within Functional Limits Oral Care Completed by SLP: No Oral Cavity - Dentition: Adequate natural dentition Vision: Functional for self-feeding Self-Feeding Abilities: Able to feed  self Patient Positioning: Upright in bed Baseline Vocal Quality: Normal Volitional Cough: Strong;Congested Volitional Swallow: Able to elicit    Oral/Motor/Sensory Function Overall Oral Motor/Sensory Function: Within functional limits   Ice Chips Ice chips:  Not tested   Thin Liquid Thin Liquid: Within functional limits    Nectar Thick Nectar Thick Liquid: Not tested   Honey Thick Honey Thick Liquid: Not tested   Puree Puree: Not tested   Solid     Solid: Not tested      Desare Duddy, Riley Nearing 09/20/2021,10:52 AM

## 2021-09-20 NOTE — Progress Notes (Addendum)
STROKE TEAM PROGRESS NOTE   INTERVAL HISTORY Her daughter and the hospitalist are at the bedside.  Patient was unable to complete MRI due to coughing and feeling like she was choking when she laid down for the scan.  Hospitalist has initiated DuoNebs and Mucinex and we will attempt scans later.  Patient expresses frustration related to her current situation.  She has noticed a decline in her functional status as has her family. She was previously in the ED on [id number] with a systolic blood pressure noted to be in the 200s with similar reports of confusion. Her daughter states that she was speaking with the patient on the phone and noted her confusion which is why initiated the visit to the ED.  MRI and CT were done then and negative for any acute stroke.  She initially left the ED and then was back on 09/14/2021 and they declined admission during that visit.  She then went to her PCP had lab work done.  Family reports that she was at her baseline up until 09/13/2021 and had previously been relatively independent at her facility.  Vitals:   09/20/21 0300 09/20/21 0400 09/20/21 0540 09/20/21 0600  BP: (!) 156/74 (!) 144/47 (!) 180/62 (!) 157/104  Pulse: 73 60 60 63  Resp: 14 19 17 20   Temp:      TempSrc:      SpO2: 93% 94% 95% 95%  Weight:      Height:       CBC:  Recent Labs  Lab 09/13/21 2317 09/13/21 2322 09/14/21 1512 09/15/21 1629 09/19/21 1131 09/20/21 0338  WBC 8.9  --  8.5   < > 7.3 6.9  NEUTROABS 6.7  --  6.4  --   --   --   HGB 14.6   < > 13.3   < > 14.2 11.4*  HCT 44.5   < > 41.0   < > 42.7 36.1  MCV 98.9  --  98.1   < > 97.9 100.8*  PLT 204  --  232   < > 243 183   < > = values in this interval not displayed.   Basic Metabolic Panel:  Recent Labs  Lab 09/19/21 1131 09/20/21 0338  NA 130* 138  K 3.5 3.1*  CL 97* 107  CO2 23 21*  GLUCOSE 172* 104*  BUN 7* 7*  CREATININE 0.69 0.52  CALCIUM 10.1 9.3   Lipid Panel:  Recent Labs  Lab 09/20/21 0338  CHOL 116   TRIG 99  HDL 45  CHOLHDL 2.6  VLDL 20  LDLCALC 51   HgbA1c:  Recent Labs  Lab 09/20/21 0338  HGBA1C 5.8*   Urine Drug Screen:  Recent Labs  Lab 09/14/21 0108  LABOPIA NONE DETECTED  COCAINSCRNUR NONE DETECTED  LABBENZ NONE DETECTED  AMPHETMU NONE DETECTED  THCU NONE DETECTED  LABBARB NONE DETECTED    Alcohol Level  Recent Labs  Lab 09/13/21 2317  ETH <10    IMAGING past 24 hours CT Head Wo Contrast  Result Date: 09/19/2021 CLINICAL DATA:  Mental status changes, ongoing weakness and failure to thrive, loss of appetite, diarrhea, history COPD, hypertension EXAM: CT HEAD WITHOUT CONTRAST TECHNIQUE: Contiguous axial images were obtained from the base of the skull through the vertex without intravenous contrast. RADIATION DOSE REDUCTION: This exam was performed according to the departmental dose-optimization program which includes automated exposure control, adjustment of the mA and/or kV according to patient size and/or use of iterative reconstruction technique. COMPARISON:  09/13/2021 FINDINGS: Brain: Generalized atrophy, greater posteriorly. Chronic LEFT cerebellar atrophy and prominence of CSF at LEFT posterior fossa. No midline shift or mass effect. Small vessel chronic ischemic changes of deep cerebral white matter. No intracranial hemorrhage, mass lesion or evidence of acute infarction. No extra-axial collection. Scattered nonspecific dural calcification. Vascular: Atherosclerotic calcifications of internal carotid and vertebral arteries at skull base Skull: Intact Sinuses/Orbits: Chronic opacification of LEFT sphenoid sinus. Nasal septal deviation to the LEFT. Other: N/A IMPRESSION: Atrophy with small vessel chronic ischemic changes of deep cerebral white matter. No acute intracranial abnormalities. Electronically Signed   By: Lavonia Dana M.D.   On: 09/19/2021 13:18   DG Chest Port 1 View  Result Date: 09/19/2021 CLINICAL DATA:  Weakness, failure to thrive EXAM: PORTABLE CHEST  1 VIEW COMPARISON:  09/14/2021 FINDINGS: Prominent superior mediastinum seen on prior CT to reflect substernal thyroid goiter. Heart is borderline in size. Aortic atherosclerosis. There is hyperinflation of the lungs compatible with COPD. Biapical scarring. No acute confluent opacities or effusions. No acute bony abnormality. IMPRESSION: COPD.  Chronic changes. No active disease. Electronically Signed   By: Rolm Baptise M.D.   On: 09/19/2021 19:22    PHYSICAL EXAM  Physical Exam  Constitutional: Appears frail, elderly.  Psych: Tearful regarding situation Cardiovascular: Normal rate and regular rhythm.  Respiratory: Effort normal, non-labored breathing   Neuro: Mental Status: Patient is awake, alert, oriented to person and month.  Disoriented to year, place, and situation No signs of aphasia or neglect Cranial Nerves: II: Visual Fields are full. Pupils are equal, round, and reactive to light.   III,IV, VI: EOMI without ptosis or diploplia. Possible right visual field decrease, still able to see fingers wiggling during exam, states she sees them less V: Facial sensation is symmetric to temperature VII: Facial movement is symmetric resting and smiling VIII: Hearing is intact to voice X: Palate elevates symmetrically XI: Shoulder shrug is symmetric. XII: Tongue protrudes midline without atrophy or fasciculations.  Motor: Tone is normal. Bulk decreased. 5/5 strength was present in all four extremities.  Sensory: Sensation is symmetric to light touch and temperature in the arms and legs. No extinction to DSS present.  Cerebellar: FNF intact bilaterally  ASSESSMENT/PLAN [name] is a 88 y.o. female with history of hypertension, hyperlipidemia, COPD, glaucoma presenting with intermittent/recurrent confusion, difficulty finding words, poor appetite, generalized weakness, and a sensation of not feeling good that has been going on since 09/13/2021.   Encephalopathy with deconditioning  and FTT, need to rule out stroke Code Stroke no acute intracranial abnormalities, atrophy with small vessel chronic ischemic changes  CTA head & neck Pending MRI (09/14/2021) no acute intracranial processes/infarcts. Foci of hemosiderin deposition in the left-greater-than-right MRI 2/7- pending 2D Echo EF 70 to 75% LDL 51 HgbA1c 5.8 VTE prophylaxis -Lovenox No antithrombotic prior to admission, now on aspirin 81 mg daily.  Therapy recommendations: Pending, No SLP follow up Disposition: Pending  Hypertension Home meds: Sotalol, amlodipine Stable Long-term BP goal normotensive As needed hydralazine  Hyperlipidemia Home meds: Rosuvastatin 20 mg, resumed in hospital LDL 51, goal < 70 Continue statin on discharge  Other Stroke Risk Factors Advanced Age >/= 41  Former cigarette smoker, advised to maintain smoking cessation Coronary artery disease Migraines Congestive heart failure  Other Active Problems Depression/anxiety COPD Pulmonary hygiene Cough, turn, deep breath, up out of bed as tolerated Dounebs and mucinex per primary team [facility] day # 1  Patient seen and examined by NP/APP with  MD. MD to update note as needed.   Janine Ores, DNP, FNP-BC [facility] Pager: [phone]  ATTENDING NOTE: I reviewed above note and agree with the assessment and plan. Pt was seen and examined.   88 year old female with history of hypertension, hyperlipidemia admitted for confusion, word finding difficulty and psychomotor slowing.  Per daughter, patient was here ED 1 week ago for confusion and difficulty walking, hypertension.  CT and MRI negative for acute finding.  Patient signed AMA.  She followed with her PCP as outpatient continues to have confusion, decreased p.o. intake, difficulty walking and generalized weakness.  She was admitted for further evaluation.  On rounding, patient complaining of coughing and difficulty with phlegm, not able to  lie flat for MRI or CTA head and neck.  EF 70 to 75%, LDL 51, A1c 5.8, creatinine 0.69, ammonia level pending.  EEG pending.  UA negative, CXR negative.  On exam, patient in depressed mood, awake alert, orientated to place, people and age, however not orientated to time.  No aphasia, paucity of speech, follows some commands, able to name and repeat.  No gaze palsy, visual field full, however complaining of right visual field decreased visual acuity.  Seem to have mild left nasolabial fold flattening.  Tongue midline.  Bilateral upper extremity at least 3/5, symmetrical, right lower extremity 3/5, left lower extremity 3 -/5.  Sensation not cooperative, finger-to-nose grossly intact however slow in action.  Etiology for patient's symptoms concerning for encephalopathy, deconditioning and failure to thrive.  Stroke cannot be completed without, still pending MRI and CT head and neck.  Discussed with Dr. Horris Latino, will treat for cough and phlegm first, once patient able to lie flat, will consider MRI and CT head and neck.  Continue aspirin and Crestor at this time.  PT/OT recommend SNF.  For detailed assessment and plan, please refer to above as I have made changes wherever appropriate.   Rosalin Hawking, MD PhD Stroke Neurology 09/20/2021 7:29 PM    To contact Stroke Continuity provider, please refer to [email or web address] After hours, contact General Neurology

## 2021-09-20 NOTE — Progress Notes (Signed)
Unable to proceed with MRI. Pt states that she cannot breath while laying down. Feels like there is something in here throat.She attempted to lay down multiple times, and she would sit up coughing saying that she could not breath.

## 2021-09-21 ENCOUNTER — Inpatient Hospital Stay (HOSPITAL_COMMUNITY): Payer: Medicare Other

## 2021-09-21 DIAGNOSIS — J449 Chronic obstructive pulmonary disease, unspecified: Secondary | ICD-10-CM

## 2021-09-21 DIAGNOSIS — E876 Hypokalemia: Secondary | ICD-10-CM | POA: Diagnosis not present

## 2021-09-21 DIAGNOSIS — E782 Mixed hyperlipidemia: Secondary | ICD-10-CM

## 2021-09-21 DIAGNOSIS — I5032 Chronic diastolic (congestive) heart failure: Secondary | ICD-10-CM | POA: Diagnosis present

## 2021-09-21 LAB — BASIC METABOLIC PANEL
Anion gap: 10 (ref 5–15)
BUN: 8 mg/dL (ref 8–23)
CO2: 21 mmol/L — ABNORMAL LOW (ref 22–32)
Calcium: 9.5 mg/dL (ref 8.9–10.3)
Chloride: 103 mmol/L (ref 98–111)
Creatinine, Ser: 0.59 mg/dL (ref 0.44–1.00)
GFR, Estimated: >60 mL/min (ref >60)
Glucose, Bld: 106 mg/dL — ABNORMAL HIGH (ref 70–99)
Potassium: 3.2 mmol/L — ABNORMAL LOW (ref 3.5–5.1)
Sodium: 134 mmol/L — ABNORMAL LOW (ref 135–145)

## 2021-09-21 LAB — CBC WITH DIFFERENTIAL/PLATELET
Abs Immature Granulocytes: 0.01 10*3/uL (ref 0.00–0.07)
Basophils Absolute: 0 10*3/uL (ref 0.0–0.1)
Basophils Relative: 0 %
Eosinophils Absolute: 0.2 10*3/uL (ref 0.0–0.5)
Eosinophils Relative: 3 %
HCT: 34.2 % — ABNORMAL LOW (ref 36.0–46.0)
Hemoglobin: 11.7 g/dL — ABNORMAL LOW (ref 12.0–15.0)
Immature Granulocytes: 0 %
Lymphocytes Relative: 25 %
Lymphs Abs: 1.9 10*3/uL (ref 0.7–4.0)
MCH: 32.6 pg (ref 26.0–34.0)
MCHC: 34.2 g/dL (ref 30.0–36.0)
MCV: 95.3 fL (ref 80.0–100.0)
Monocytes Absolute: 0.7 10*3/uL (ref 0.1–1.0)
Monocytes Relative: 9 %
Neutro Abs: 4.9 10*3/uL (ref 1.7–7.7)
Neutrophils Relative %: 63 %
Platelets: 214 10*3/uL (ref 150–400)
RBC: 3.59 MIL/uL — ABNORMAL LOW (ref 3.87–5.11)
RDW: 12.3 % (ref 11.5–15.5)
WBC: 7.7 10*3/uL (ref 4.0–10.5)
nRBC: 0 % (ref 0.0–0.2)

## 2021-09-21 LAB — AMMONIA: Ammonia: 40 umol/L — ABNORMAL HIGH (ref 9–35)

## 2021-09-21 IMAGING — MR MR HEAD W/O CM
6 of 11 series · 24 of 48 positions shown · non-contrast
Comparison: Head CT from 2 days ago.  Brain MRI [DATE]

CLINICAL DATA: Stroke follow-up

EXAM:
MRI HEAD WITHOUT CONTRAST
TECHNIQUE: Multiplanar, multiecho pulse sequences of the brain and surrounding
structures were obtained without intravenous contrast.

[Series 2: DWI · axial · 3.0mm · 0.94mm/px · z∈[-72,+74]mm · 7 of 100 slices shown (1 of 2)]
[im 1/100]
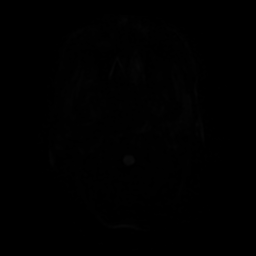
[im 17/100]
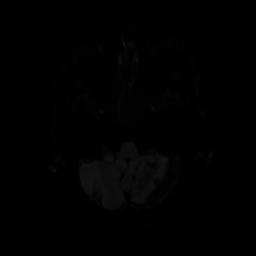
[im 34/100]
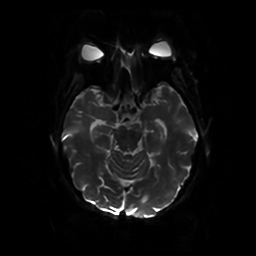
[im 50/100]
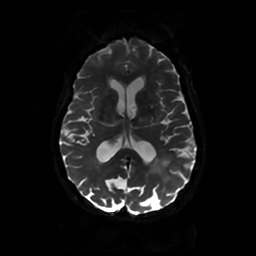
[im 67/100]
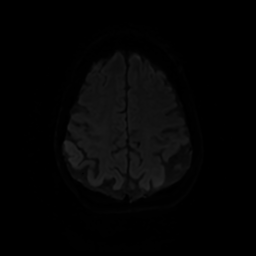
[im 83/100]
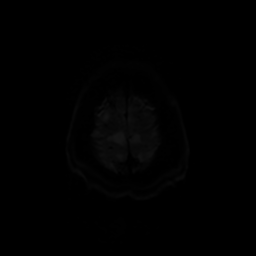
[im 100/100]
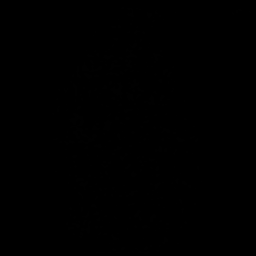

[Series 3: DWI · coronal · 4.0mm · 0.94mm/px · 5 of 74 slices shown (2 of 2)]
[im 1/74]
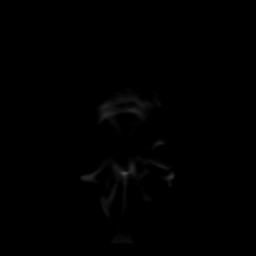
[im 19/74]
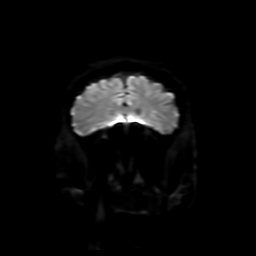
[im 37/74]
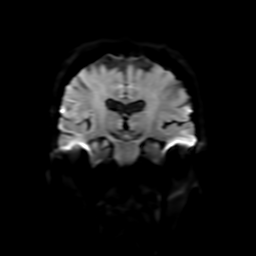
[im 55/74]
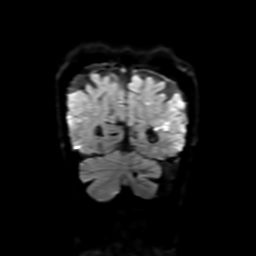
[im 74/74]
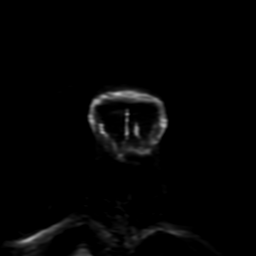

[Series 4: FLAIR · sagittal · 5.0mm · 0.23mm/px · 2 of 25 slices shown (1 of 2)]
[im 1/25]
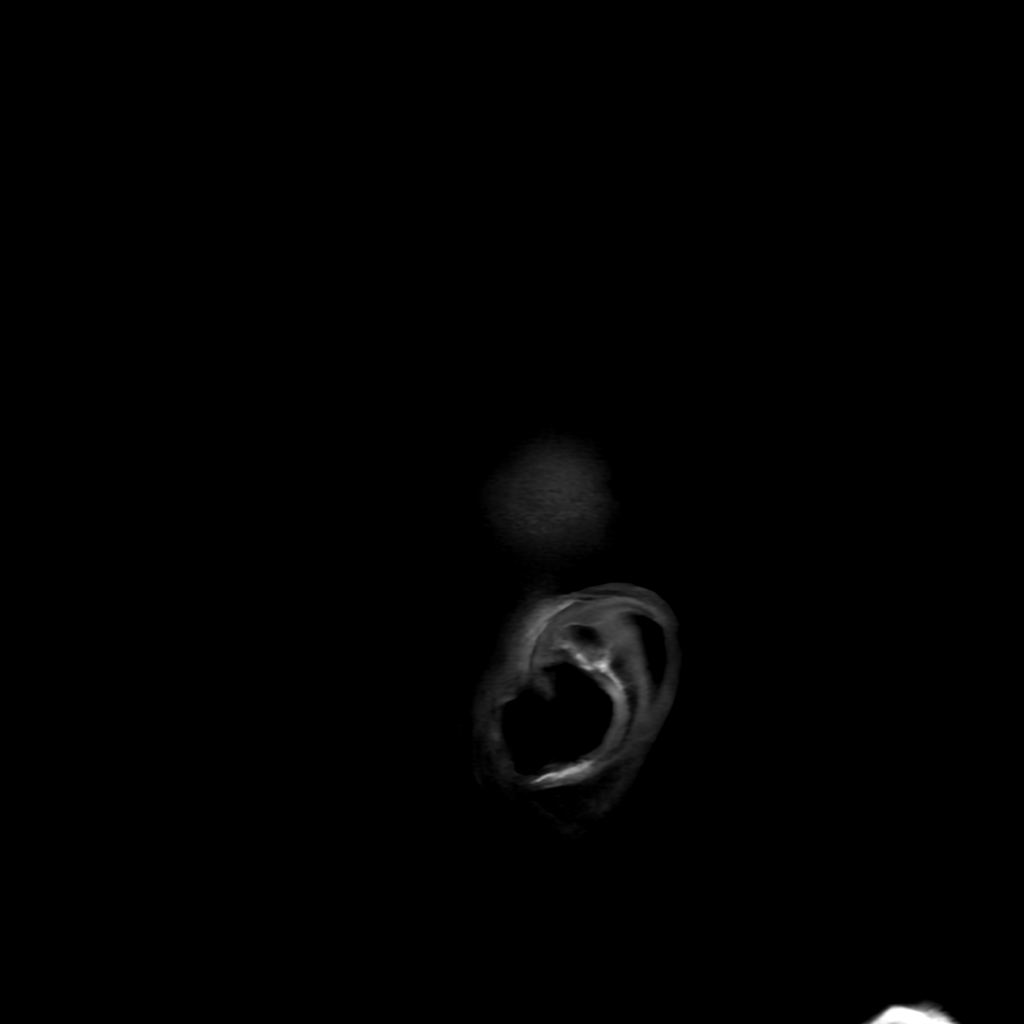
[im 25/25]
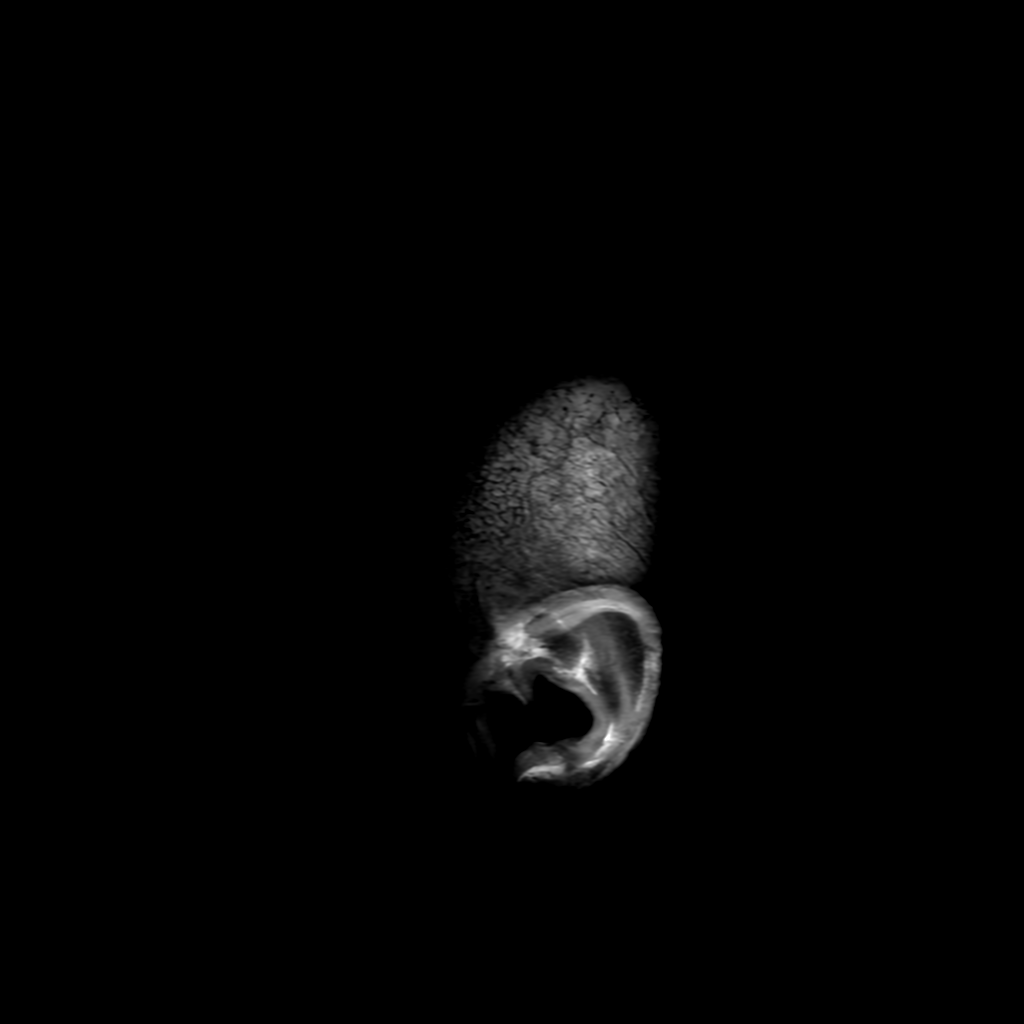

[Series 6: FLAIR · axial · 4.0mm · 0.45mm/px · z∈[-80,+69]mm · 3 of 35 slices shown (2 of 2)]
[im 1/35]
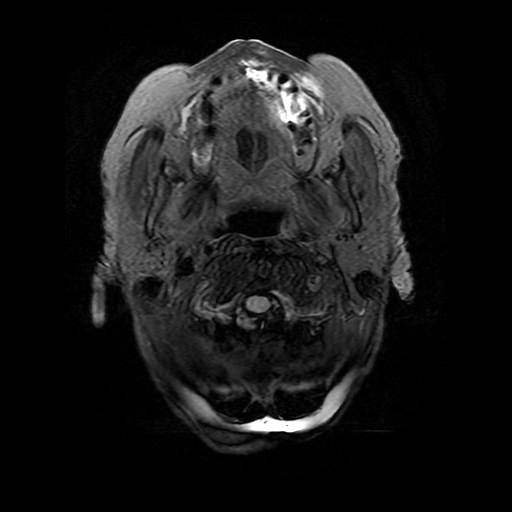
[im 18/35]
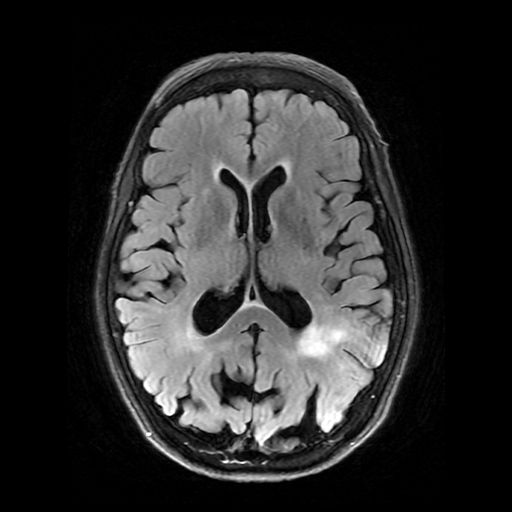
[im 35/35]
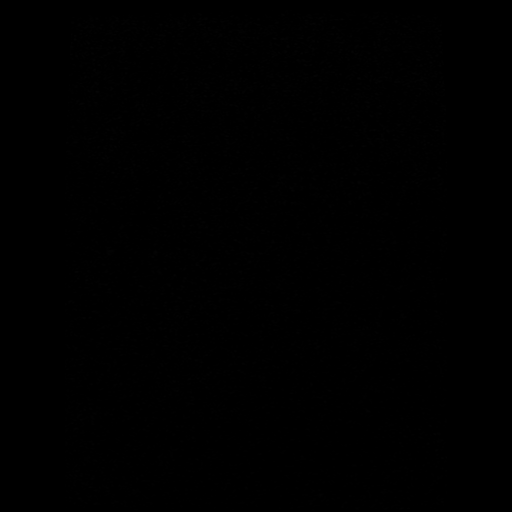

[Series 250: ADC · axial · 3.0mm · 0.94mm/px · z∈[-72,+71]mm · 4 of 49 slices shown (1 of 2)]
[im 1/49]
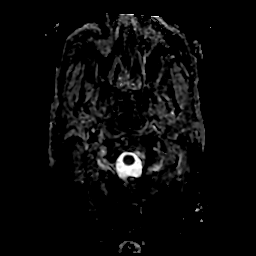
[im 17/49]
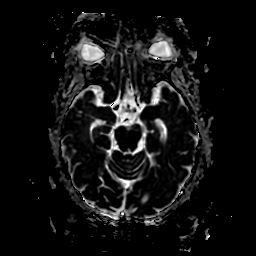
[im 33/49]
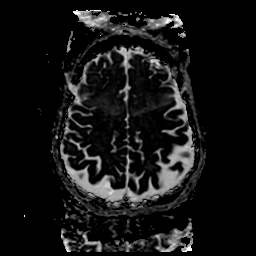
[im 49/49]
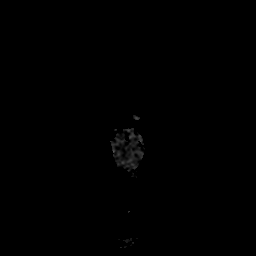

[Series 350: ADC · coronal · 4.0mm · 0.94mm/px · 3 of 37 slices shown (2 of 2)]
[im 1/37]
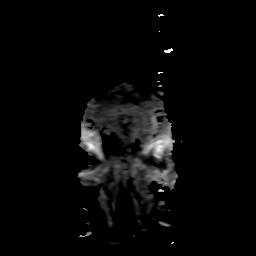
[im 19/37]
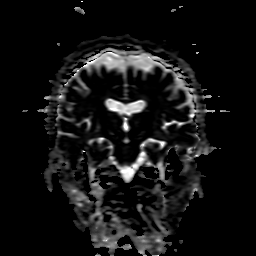
[im 37/37]
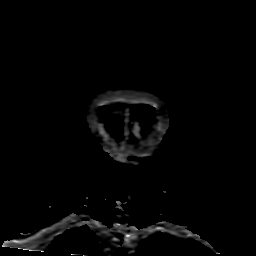

[24 of 48 positions shown; findings below may reference images not displayed]

FINDINGS: Brain: Patchy acute infarction in the left parietal lobe, primarily
white matter with extension from subcortical white matter to the
lateral ventricle.

Branching hypointensity on gradient and T2 weighted imaging with
nodular areas of decreased gradient signal in the left cerebellum, a
large developmental venous anomaly with chronic
congestive/microhemorrhages versus associated cavernomas/capillary
telangiectasias. DVA appearance was confirmed on a [DATE] CT
with contrast. Remote insult with hemosiderin staining in the deep
right cerebellum. Mild for age chronic small vessel ischemia and
cerebral volume loss.

Chronic CSF accumulation posterior to the left more than right
cerebellum.

No hydrocephalus or collection.

Vascular: Left cerebellar findings as above.

Skull and upper cervical spine: Normal marrow signal

Sinuses/Orbits: Bilateral cataract resection. Chronic left sphenoid
sinusitis with complete opacification by inspissated material.
IMPRESSION: 1. Patchy acute infarction in the low left parietal lobe.
2. Complicated but stable DVA appearance in the left cerebellum.

## 2021-09-21 MED ORDER — FAMOTIDINE 20 MG PO TABS: 20.0000 mg | ORAL_TABLET | Freq: Every day | ORAL | Status: DC

## 2021-09-21 MED ORDER — POTASSIUM CHLORIDE CRYS ER 20 MEQ PO TBCR
40.0000 meq | EXTENDED_RELEASE_TABLET | Freq: Once | ORAL | Status: AC
  Administered 2021-09-21: 40 meq via ORAL
  Filled 2021-09-21: qty 2

## 2021-09-21 MED ORDER — HYDRALAZINE HCL 25 MG PO TABS: 25.0000 mg | ORAL_TABLET | Freq: Four times a day (QID) | ORAL | Status: DC | PRN

## 2021-09-21 MED ORDER — AZITHROMYCIN 500 MG PO TABS
500.0000 mg | ORAL_TABLET | Freq: Every day | ORAL | Status: DC
  Administered 2021-09-21 – 2021-09-23 (×3): 500 mg via ORAL
  Filled 2021-09-21 (×3): qty 1

## 2021-09-21 MED ORDER — FAMOTIDINE 20 MG PO TABS
20.0000 mg | ORAL_TABLET | Freq: Two times a day (BID) | ORAL | Status: DC
  Administered 2021-09-22 – 2021-09-23 (×3): 20 mg via ORAL
  Filled 2021-09-21 (×4): qty 1

## 2021-09-21 NOTE — Assessment & Plan Note (Signed)
Resume crestor 

## 2021-09-21 NOTE — TOC Initial Note (Addendum)
Transition of Care [facility]) - Initial/Assessment Note    Patient Details  Name: Linda [name] MRN: [id number] Date of Birth: 23-Apr-1924  Transition of [facility]) CM/SW Contact:    Coralee Pesa, [place] Phone Number: 09/21/2021, 11:06 AM  Clinical Narrative:                 4:00pm  CSW provided bed offers to patient's daughter. Daughter asked about [place] facilities, at this time, there are no offers in [place]. She asked about [facility], [facility] has no beds available and [facility] is still pending. TOC will continue to follow for DC needs.  CSW spoke with pt and daughter, Malachy Mood at bedside. CSW explained recommendation for SNF and discussed the difference between [facility] and SNF level therapies. Daughter notes hesitation with SNF placement, but acknowledges pt may not have enough support at her independent living, [place]. Dtr asked about [place] area rehabs and was provided with medicare.gov information. Pt has had 2 covid shots and 3 boosters.  CSW will send referral's for SNF and provide options when available. TOC will continue to follow for DC needs.  Expected Discharge Plan: Skilled Nursing Facility Barriers to Discharge: Continued Medical Work up, SNF Pending bed offer   Patient Goals and CMS Choice Patient states their goals for this hospitalization and ongoing recovery are:: Pt states she would like to do whatever gets her better. CMS Medicare.gov Compare Post Acute Care list provided to:: Patient Represenative (must comment) (Daughter) Choice offered to / list presented to : Adult Children, Patient  Expected Discharge Plan and Services Expected Discharge Plan: [place] Acute Care Choice: [place] Living arrangements for the past 2 months: [place]                                      Prior Living Arrangements/Services Living arrangements for the past 2 months: [place] Lives with:: Facility Resident Patient language and need for interpreter reviewed:: Yes Do you feel safe going back to the place where you live?: Yes      Need for Family Participation in Patient Care: Yes (Comment) Care giver support system in place?: No (comment)   Criminal Activity/Legal Involvement Pertinent to Current Situation/Hospitalization: No - Comment as needed  Activities of Daily Living      Permission Sought/Granted Permission sought to share information with : Family Supports Permission granted to share information with : Yes, Verbal Permission Granted  Share Information with NAME: Malachy Mood [place]     Permission granted to share info w Relationship: Daughter  Permission granted to share info w Contact Information: [phone]  Emotional Assessment Appearance:: Appears stated age Attitude/Demeanor/Rapport: Engaged Affect (typically observed): Pleasant Orientation: : Oriented to Self, Oriented to Place, Oriented to Situation Alcohol / Substance Use: Not Applicable Psych Involvement: No (comment)  Admission diagnosis:  Cough [R05.9] Hyponatremia [E87.1] Altered mental status [R41.82] Weakness [R53.1] Dyspnea [R06.00] Difficulty with speech [R47.9] Patient Active Problem List   Diagnosis Date Noted   Altered mental status 09/19/2021   Osteoarthritis 08/12/2021   Dermatitis 04/13/2021   Multinodular goiter 11/19/2020   Hypercalcemia 11/19/2020   Hyperparathyroidism ([place]) 11/19/2020   Bronchiectasis without acute exacerbation ([place]) 07/14/2019   Chronic cough 02/03/2019   Thoracic back pain 10/13/2018   Hyperlipidemia 10/13/2018   Vitamin D deficiency 10/13/2018   Gastroesophageal reflux disease without esophagitis  10/13/2018   Chronic midline thoracic back pain 10/13/2018   Osteoporosis without current pathological fracture 10/13/2018   Essential hypertension 10/13/2018   Stenosis of carotid artery 10/13/2018   History of thyroid nodule, s/p  bx, benign 10/13/2018   Aortic cusp regurgitation 10/13/2018   Nonrheumatic tricuspid valve regurgitation 10/13/2018   Coccyalgia 10/13/2018   Irritable bowel syndrome 10/13/2018   Bilateral hearing loss 10/13/2018   Chronic rhinitis 10/13/2018   Paroxysmal atrial tachycardia ([place]) 10/13/2018   PCP:  Vivi Barrack, MD Pharmacy:   [place], [place] [address] Phone: [phone] Fax: [place], [address] AT [facility] [address] [place] [address] Phone: [phone] Fax: [phone]     Social Determinants of Health ([place]) Interventions    Readmission Risk Interventions No flowsheet data found.

## 2021-09-21 NOTE — Assessment & Plan Note (Signed)
Replaced. Recheck wnl.

## 2021-09-21 NOTE — Assessment & Plan Note (Addendum)
Continue with bronchodilators.  CXR is negative for pneumonia.  Continue with azithromycin for bronchiectasis.  She reports breathing has improved.  No wheezing on exam today.

## 2021-09-21 NOTE — Progress Notes (Signed)
This patient is receiving the antibiotic azithromycin by the intravenous route.   Based on criteria approved by the Pharmacy and Therapeutics Committee, and the  Infectious Disease Division, the antibiotic(s) is / are being converted to equivalent oral dose form(s). These criteria include:  Patient being treated for a respiratory tract infection, urinary tract infection, cellulitis, or Clostridium Difficile associated diarrhea  The patient is not neutropenic and does not exhibit a GI malabsorption state  The patient is eating (either orally or per tube) and/or has been taking other orally administered medications for at least 24 hours.  The patient is improving clinically (physician assessment and a 24-hour Tmax of 100.5 F).   If you have questions about this conversion, please contact the pharmacy department.   Thank you for allowing pharmacy to be a part of this patient's care.  Joie Reamer, PharmD Clinical Pharmacist 

## 2021-09-21 NOTE — Progress Notes (Signed)
[facility]                                                                               [employer], is a 88 y.o. female, DOB - 27-Aug-1923, [id number] Admit date - 09/19/2021    Outpatient Primary MD for the patient is Jerline Pain, Algis Greenhouse, MD  LOS - 2  days    Brief summary   Linda [name] is a 88 y.o. female with medical history significant of HTN, HLD, COPD, glaucoma presents to the ED with multiple complaints including intermittent/recurrent confusion, difficulty finding words, poor appetite, generalized weakness and a sensation of not feeling good that has been ongoing for the past 1 week.  Of note, patient first presented to the ED on 09/13/2021, at that time SBP noted to be in the 200s, with similar complaints, MRI and CT scans done at that time were negative for acute stroke. Discussions about admission for possible hypertensive encephalopathy was discussed with patient and the neurology consultation, but patient left AMA.  Patient returned to the ED on 09/14/2021 again with similar complaints, recommended for admission for further eval, pt declined and signed AMA again. On 09/15/21 pt followed up with PCP and basic lab work was done, reported resolved diarrhea. On 09/19/21, pt went to PCP again still with similar complaints and was advised to go to the ED. According to her daughter, patient was able to perform her ADLs in an independent living facility, ambulates with a rollator and was at her baseline up until 09/13/2021. Reports some productive cough of whitish sputum which has been ongoing for the past couple of days. In the ED, vital signs fairly stable except for some SBP's in the 190s, labs unremarkable except for sodium of 130, chloride 97, UA showed small hemoglobin, trace leukocytes, SG less than 1.005, rare bacteria.  CT head with no acute intracranial abnormalities.  EDP spoke to neurology, recommended admission and they will consult.  Patient admitted for further  management.   Assessment & Plan    Assessment and Plan: * Altered mental status- (present on admission) Currently, unknown etiology ?Hypertensive encephalopathy Vs TIA/CVA, Vs ?early phase of mild cognitive impairment CT head and MRI brain (09/14/21) unremarkable for any acute intracranial abnormality CTA head and neck, with MRI brain pending (patient reports unable to lay flat due to cough and difficulty breathing) Echo showed EF of 70 to [id number], grade 2 diastolic dysfunction, with moderately elevated pulmonary artery systolic pressure [id number] mmHG. Currently on aspirin 81 mg daily.  Rule out any infectious causes, UA with trace leukocytes, rare bacteria, urine culture on 09/15/2021 no growth Chest x-ray X 2, no active disease EEG pending result Vit B12 369, RPR non-reactive Neurology consulted, appreciate recs  A1c, 5.8, LDL 51 Telemetry, frequent neurochecks PT/OT/SLP- SNF    Chronic diastolic CHF (congestive heart failure) ([place])- (present on admission) Last echocardiogram shows Left ventricular ejection fraction, 70 to 75% with hyperdynamic function. The left ventricle has no  regional wall motion abnormalities. Left ventricular diastolic parameters  are consistent with Grade II diastolic dysfunction (pseudonormalization). There is moderately elevated pulmonary artery systolic pressure. The estimated right ventricular systolic pressure is [id number] mmHg.  she  remains on RA,.   Hypokalemia Replaced. Recheck wnl.   Essential hypertension- (present on admission) Well controlled. No changes in medications.  Hyperlipidemia- (present on admission) Resume crestor     COPD:  Continue with bronchodilators,  CXR is negative for pneumonia.  On azithromycin.      Estimated body mass index is 20.91 kg/m as calculated from the following:   Height as of this encounter: 5\' 5"  (1.651 m).   Weight as of this encounter: 57 kg.  Code Status: DNR DVT Prophylaxis:  enoxaparin (LOVENOX) injection 40  mg Start: 09/19/21 2230 SCDs Start: 09/19/21 2110   Level of Care: Level of care: Telemetry Medical Family Communication: Updated patient's family at bedside.   Disposition Plan:     Remains inpatient appropriate:  pending further work up for sob.    Procedures: MRI brain without contrast.   Consultants:   Neurology.   Antimicrobials:   Anti-infectives (From admission, onward)    Start     Dose/Rate Route Frequency Ordered Stop   09/20/21 1415  azithromycin (ZITHROMAX) 500 mg in sodium chloride 0.9 % 250 mL IVPB        500 mg 250 mL/hr over 60 Minutes Intravenous Every 24 hours 09/20/21 1412 09/25/21 1414        Medications  Scheduled Meds:  amLODipine  5 mg Oral Daily   aspirin EC  81 mg Oral Daily   dextromethorphan-guaiFENesin  1 tablet Oral BID   enoxaparin (LOVENOX) injection  40 mg Subcutaneous Q24H   famotidine  20 mg Oral Daily   ipratropium-albuterol  3 mL Nebulization BID   rosuvastatin  20 mg Oral Daily   sotalol  80 mg Oral Q12H   timolol  1 drop Both Eyes Daily   Continuous Infusions:  azithromycin 500 mg (09/20/21 1509)   PRN Meds:.acetaminophen, albuterol, hydrALAZINE, ondansetron **OR** ondansetron (ZOFRAN) IV, polyethylene glycol    Subjective:   Linda [name] was seen and examined today.  No new complaints at this time. Cough is improving.   Objective:   Vitals:   09/21/21 0325 09/21/21 0817 09/21/21 1012 09/21/21 1148  BP: (!) 169/65  (!) 160/59 (!) 166/78  Pulse: 74  65 66  Resp: 18  16 18   Temp: 97.8 F (36.6 C)  98.5 F (36.9 C) 98 F (36.7 C)  TempSrc: Axillary  Oral Oral  SpO2: 95% 97% 95% 97%  Weight:      Height:        Intake/Output Summary (Last 24 hours) at 09/21/2021 1205 Last data filed at 09/20/2021 2000 Gross per 24 hour  Intake 491.33 ml  Output --  Net 491.33 ml   Filed Weights   09/19/21 1128  Weight: 57 kg     Exam General: Alert and oriented x 3, NAD Cardiovascular: S1 S2 auscultated, no murmurs,  RRR Respiratory: Clear to auscultation bilaterally,  Gastrointestinal: Soft, nontender, nondistended, + bowel sounds Ext: no pedal edema bilaterally Neuro: AAOx3, no focal deficits.  Skin: No rashes Psych: mood is appropriate.    Data Reviewed:  I have personally reviewed following labs and imaging studies   CBC Lab Results  Component Value Date   WBC 7.7 09/21/2021   RBC 3.59 (L) 09/21/2021   HGB 11.7 (L) 09/21/2021   HCT 34.2 (L) 09/21/2021   MCV 95.3 09/21/2021   MCH 32.6 09/21/2021   PLT 214 09/21/2021   MCHC 34.2 09/21/2021   RDW 12.3 09/21/2021   LYMPHSABS 1.9 09/21/2021   MONOABS 0.7 09/21/2021  EOSABS 0.2 09/21/2021   BASOSABS 0.0 [id number]     Last metabolic panel Lab Results  Component Value Date   NA 134 (L) 09/21/2021   K 3.2 (L) 09/21/2021   CL 103 09/21/2021   CO2 21 (L) 09/21/2021   BUN 8 09/21/2021   CREATININE 0.59 09/21/2021   GLUCOSE 106 (H) 09/21/2021   GFRNONAA >60 09/21/2021   GFRAA 89 06/23/2020   CALCIUM 9.5 09/21/2021   PROT 5.5 (L) 09/20/2021   ALBUMIN 3.2 (L) 09/20/2021   BILITOT 0.4 09/20/2021   ALKPHOS 60 09/20/2021   AST 21 09/20/2021   ALT 17 09/20/2021   ANIONGAP 10 09/21/2021    CBG (last 3)  Recent Labs    09/19/21 1541  GLUCAP 103*      Coagulation Profile: No results for input(s): INR, PROTIME in the last 168 hours.   Radiology Studies: CT Head Wo Contrast  Result Date: 09/19/2021 CLINICAL DATA:  Mental status changes, ongoing weakness and failure to thrive, loss of appetite, diarrhea, history COPD, hypertension EXAM: CT HEAD WITHOUT CONTRAST TECHNIQUE: Contiguous axial images were obtained from the base of the skull through the vertex without intravenous contrast. RADIATION DOSE REDUCTION: This exam was performed according to the departmental dose-optimization program which includes automated exposure control, adjustment of the mA and/or kV according to patient size and/or use of iterative reconstruction  technique. COMPARISON:  09/13/2021 FINDINGS: Brain: Generalized atrophy, greater posteriorly. Chronic LEFT cerebellar atrophy and prominence of CSF at LEFT posterior fossa. No midline shift or mass effect. Small vessel chronic ischemic changes of deep cerebral white matter. No intracranial hemorrhage, mass lesion or evidence of acute infarction. No extra-axial collection. Scattered nonspecific dural calcification. Vascular: Atherosclerotic calcifications of internal carotid and vertebral arteries at skull base Skull: Intact Sinuses/Orbits: Chronic opacification of LEFT sphenoid sinus. Nasal septal deviation to the LEFT. Other: N/A IMPRESSION: Atrophy with small vessel chronic ischemic changes of deep cerebral white matter. No acute intracranial abnormalities. Electronically Signed   By: Lavonia Dana M.D.   On: 09/19/2021 13:18   DG Chest Port 1 View  Result Date: 09/20/2021 CLINICAL DATA:  Dyspnea.  History COPD EXAM: PORTABLE CHEST 1 VIEW COMPARISON:  09/19/2021 FINDINGS: Heart size and vascularity within normal limits. Atherosclerotic calcification thoracic aorta. Lungs are clear without infiltrate or effusion. IMPRESSION: No active disease. Electronically Signed   By: Franchot Gallo M.D.   On: 09/20/2021 13:12   DG Chest Port 1 View  Result Date: 09/19/2021 CLINICAL DATA:  Weakness, failure to thrive EXAM: PORTABLE CHEST 1 VIEW COMPARISON:  09/14/2021 FINDINGS: Prominent superior mediastinum seen on prior CT to reflect substernal thyroid goiter. Heart is borderline in size. Aortic atherosclerosis. There is hyperinflation of the lungs compatible with COPD. Biapical scarring. No acute confluent opacities or effusions. No acute bony abnormality. IMPRESSION: COPD.  Chronic changes. No active disease. Electronically Signed   By: Rolm Baptise M.D.   On: 09/19/2021 19:22   ECHOCARDIOGRAM COMPLETE  Result Date: 09/20/2021    ECHOCARDIOGRAM REPORT   Patient Name:   Linda [name] Date of Exam: 09/20/2021 Medical Rec  #:  [id number]     Height:       65.0 in Accession #:    [id number]    Weight:       125.7 lb Date of Birth:  07-20-1924     BSA:          1.624 m Patient Age:    89 years      BP:  127/68 mmHg Patient Gender: F             HR:           72 bpm. Exam Location:  Inpatient Procedure: 2D Echo Indications:    TIA  History:        Patient has prior history of Echocardiogram examinations, most                 recent 09/30/2019. Signs/Symptoms:Altered Mental Status; Risk                 Factors:Hypertension.  Sonographer:    Johny Chess RDCS Referring Phys: [id number] [place]  1. Left ventricular ejection fraction, by estimation, is 70 to 75%. The left ventricle has hyperdynamic function. The left ventricle has no regional wall motion abnormalities. Left ventricular diastolic parameters are consistent with Grade II diastolic dysfunction (pseudonormalization). Elevated left atrial pressure.  2. Right ventricular systolic function is normal. The right ventricular size is normal. There is moderately elevated pulmonary artery systolic pressure. The estimated right ventricular systolic pressure is [id number] mmHg.  3. The mitral valve is normal in structure. Trivial mitral valve regurgitation.  4. Tricuspid valve regurgitation is mild to moderate.  5. The aortic valve is tricuspid. There is mild calcification of the aortic valve. Aortic valve regurgitation is mild. Aortic valve sclerosis is present, with no evidence of aortic valve stenosis.  6. The inferior vena cava is normal in size with <50% respiratory variability, suggesting right atrial pressure of 8 mmHg. Comparison(s): Prior images reviewed side by side. The left ventricular diastolic function is significantly worse. There is now evidence of increased mean left atrial pressure, otherwise no significant change. FINDINGS  Left Ventricle: Left ventricular ejection fraction, by estimation, is 70 to 75%. The left ventricle has hyperdynamic  function. The left ventricle has no regional wall motion abnormalities. The left ventricular internal cavity size was normal in size. There is borderline concentric left ventricular hypertrophy. Left ventricular diastolic parameters are consistent with Grade II diastolic dysfunction (pseudonormalization). Elevated left atrial pressure. Right Ventricle: The right ventricular size is normal. No increase in right ventricular wall thickness. Right ventricular systolic function is normal. There is moderately elevated pulmonary artery systolic pressure. The tricuspid regurgitant velocity is 3.55 m/s, and with an assumed right atrial pressure of 8 mmHg, the estimated right ventricular systolic pressure is [id number] mmHg. Left Atrium: Left atrial size was normal in size. Right Atrium: Right atrial size was normal in size. Pericardium: There is no evidence of pericardial effusion. Mitral Valve: The mitral valve is normal in structure. Trivial mitral valve regurgitation. Tricuspid Valve: The tricuspid valve is normal in structure. Tricuspid valve regurgitation is mild to moderate. Aortic Valve: The aortic valve is tricuspid. There is mild calcification of the aortic valve. Aortic valve regurgitation is mild. Aortic regurgitation PHT measures 361 msec. Aortic valve sclerosis is present, with no evidence of aortic valve stenosis. Pulmonic Valve: The pulmonic valve was not well visualized. Pulmonic valve regurgitation is not visualized. Aorta: The aortic root is normal in size and structure. Venous: The inferior vena cava is normal in size with less than 50% respiratory variability, suggesting right atrial pressure of 8 mmHg. IAS/Shunts: No atrial level shunt detected by color flow Doppler.  LEFT VENTRICLE PLAX 2D LVIDd:         3.70 cm   Diastology LVIDs:         2.20 cm   LV e' medial:    4.46 cm/s LV  PW:         1.20 cm   LV E/e' medial:  22.6 LV IVS:        1.00 cm   LV e' lateral:   4.68 cm/s LVOT diam:     1.70 cm   LV E/e'  lateral: 21.6 LV SV:         56 LV SV Index:   34 LVOT Area:     2.27 cm  RIGHT VENTRICLE             IVC RV S prime:     15.10 cm/s  IVC diam: 1.90 cm TAPSE (M-mode): 2.0 cm LEFT ATRIUM             Index        RIGHT ATRIUM           Index LA diam:        3.30 cm 2.03 cm/m   RA Area:     11.10 cm LA Vol (A2C):   47.6 ml 29.32 ml/m  RA Volume:   21.90 ml  13.49 ml/m LA Vol (A4C):   46.8 ml 28.82 ml/m LA Biplane Vol: 48.7 ml 30.00 ml/m  AORTIC VALVE LVOT Vmax:   118.00 cm/s LVOT Vmean:  79.800 cm/s LVOT VTI:    0.246 m AI PHT:      361 msec  AORTA Ao Root diam: 2.70 cm Ao Asc diam:  3.20 cm MITRAL VALVE                TRICUSPID VALVE MV Area (PHT): 3.17 cm     TR Peak grad:   50.4 mmHg MV Decel Time: 239 msec     TR Vmax:        355.00 cm/s MV E velocity: 101.00 cm/s MV A velocity: 116.00 cm/s  SHUNTS MV E/A ratio:  0.87         Systemic VTI:  0.25 m                             Systemic Diam: 1.70 cm Dani Gobble Croitoru MD Electronically signed by Sanda Klein MD Signature Date/Time: 09/20/2021/3:17:25 PM    Final        Hosie Poisson M.D. [facility] 09/21/2021, 12:05 PM  Available via Epic secure chat 7am-7pm After 7 pm, please refer to night coverage provider listed on amion.

## 2021-09-21 NOTE — [place] FL2 (Signed)
[place] MEDICAID FL2 LEVEL OF CARE SCREENING TOOL     IDENTIFICATION  Patient Name: Linda [name] Birthdate: 13-Mar-1924 Sex: female Admission Date (Current Location): 09/19/2021  [facility] and [place] Number:  Producer, television/film/video and Address:  [facility], [address], [place], [place] [address]      Provider Number: [id number]  Attending Physician Name and Address:  Kathlen Mody, MD  Relative Name and Phone Number:  Ceazia Harb, [phone]    Current Level of Care: Hospital Recommended Level of Care: Skilled Nursing Facility Prior Approval Number:    Date Approved/Denied:   PASRR Number: [id number]  Discharge Plan: SNF    Current Diagnoses: Patient Active Problem List   Diagnosis Date Noted   Altered mental status 09/19/2021   Osteoarthritis 08/12/2021   Dermatitis 04/13/2021   Multinodular goiter 11/19/2020   Hypercalcemia 11/19/2020   Hyperparathyroidism (HCC) 11/19/2020   Bronchiectasis without acute exacerbation (HCC) 07/14/2019   Chronic cough 02/03/2019   Thoracic back pain 10/13/2018   Hyperlipidemia 10/13/2018   Vitamin D deficiency 10/13/2018   Gastroesophageal reflux disease without esophagitis 10/13/2018   Chronic midline thoracic back pain 10/13/2018   Osteoporosis without current pathological fracture 10/13/2018   Essential hypertension 10/13/2018   Stenosis of carotid artery 10/13/2018   History of thyroid nodule, s/p bx, benign 10/13/2018   Aortic cusp regurgitation 10/13/2018   Nonrheumatic tricuspid valve regurgitation 10/13/2018   Coccyalgia 10/13/2018   Irritable bowel syndrome 10/13/2018   Bilateral hearing loss 10/13/2018   Chronic rhinitis 10/13/2018   Paroxysmal atrial tachycardia (HCC) 10/13/2018    Orientation RESPIRATION BLADDER Height & Weight     Self, Situation, Place  Normal Continent Weight: 125 lb 10.6 oz (57 kg) Height:  5\' 5"  (165.1 cm)  BEHAVIORAL SYMPTOMS/MOOD NEUROLOGICAL BOWEL  NUTRITION STATUS      Continent Diet (See DC summary)  AMBULATORY STATUS COMMUNICATION OF NEEDS Skin   Limited Assist Verbally Skin abrasions (Bilateral arm Ecchymosis)                       Personal Care Assistance Level of Assistance  Bathing, Feeding, Dressing Bathing Assistance: Limited assistance Feeding assistance: Limited assistance Dressing Assistance: Limited assistance     Functional Limitations Info  Sight, Hearing, Speech Sight Info: Adequate Hearing Info: Adequate Speech Info: Adequate    SPECIAL CARE FACTORS FREQUENCY  PT (By licensed PT), OT (By licensed OT)     PT Frequency: 5x week OT Frequency: 5x week            Contractures Contractures Info: Not present    Additional Factors Info  Code Status, Allergies Code Status Info: DNR Allergies Info: NKA           Current Medications (09/21/2021):  This is the current hospital active medication list Current Facility-Administered Medications  Medication Dose Route Frequency Provider Last Rate Last Admin   acetaminophen (TYLENOL) tablet 1,000 mg  1,000 mg Oral Q6H PRN 11/19/2021, MD   1,000 mg at 09/21/21 0832   albuterol (PROVENTIL) (2.5 MG/3ML) 0.083% nebulizer solution 2.5 mg  2.5 mg Nebulization Q2H PRN 11/19/21, MD       amLODipine (NORVASC) tablet 5 mg  5 mg Oral Daily Briant Cedar, MD   5 mg at 09/21/21 11/19/21   aspirin EC tablet 81 mg  81 mg Oral Daily 8413, MD   81 mg at 09/21/21 0833   azithromycin (ZITHROMAX) 500  mg in sodium chloride 0.9 % 250 mL IVPB  500 mg Intravenous Q24H Briant Cedar, MD 250 mL/hr at 09/20/21 1509 500 mg at 09/20/21 1509   dextromethorphan-guaiFENesin (MUCINEX DM) 30-600 MG per 12 hr tablet 1 tablet  1 tablet Oral BID Briant Cedar, MD   1 tablet at 09/21/21 0833   enoxaparin (LOVENOX) injection 40 mg  40 mg Subcutaneous Q24H Briant Cedar, MD   40 mg at 09/20/21 2233   famotidine (PEPCID) tablet 20 mg  20 mg Oral  Daily Briant Cedar, MD   20 mg at 09/21/21 8921   hydrALAZINE (APRESOLINE) injection 10 mg  10 mg Intravenous Q8H PRN Briant Cedar, MD   10 mg at 09/20/21 0815   ipratropium-albuterol (DUONEB) 0.5-2.5 (3) MG/3ML nebulizer solution 3 mL  3 mL Nebulization BID Briant Cedar, MD   3 mL at 09/21/21 0817   ondansetron (ZOFRAN) tablet 4 mg  4 mg Oral Q6H PRN Briant Cedar, MD       Or   ondansetron [facility]) injection 4 mg  4 mg Intravenous Q6H PRN Briant Cedar, MD       polyethylene glycol (MIRALAX / GLYCOLAX) packet 17 g  17 g Oral Daily PRN Briant Cedar, MD       rosuvastatin (CRESTOR) tablet 20 mg  20 mg Oral Daily Briant Cedar, MD   20 mg at 09/21/21 1941   sotalol (BETAPACE) tablet 80 mg  80 mg Oral Q12H Eduard Clos, MD   80 mg at 09/21/21 7408   timolol (TIMOPTIC) 0.25 % ophthalmic solution 1 drop  1 drop Both Eyes Daily Briant Cedar, MD   1 drop at 09/21/21 1033     Discharge Medications: Please see discharge summary for a list of discharge medications.  Relevant Imaging Results:  Relevant Lab Results:   Additional Information SS# [id number] [place]

## 2021-09-21 NOTE — Evaluation (Signed)
Occupational Therapy Evaluation Patient Details Name: Linda [name] MRN: [id number] DOB: [birth year] Today's Date: 09/21/2021   History of Present Illness 88 yo female with onset of low intake, weakness, FTT, diarrhea and confusion was admitted from ILF on 2/6.  Pt is cleared for a stroke change, but had low Na+, uncontrolled HTN and possible encephalopathy.  PMHx:  COPD, HTN, atherosclerosis, glaucoma, osteoporosis, thyroid disease   Clinical Impression   Linda [name] was evaluated s/p the above admission list. She was generally mod I PTA with use of rollator with some general cognitive decline recently, she is currently residing at an ILF. Pt's daughter reported that she is the only child and works full time and is unable to provide 24/7 care for her mom. Upon evaluation pt was supervision- min guard level for all mobility and ADLs with use of rollator. She did require increased time and minimal cues fro problem solving and sequencing for grooming tasks. Pt is limited by impaired cognition which leads to safety concerns since she is home alone at baseline. Pt will benefit from OT acutely. Recommend d/c to SNF at this time due to safety concerns with impaired cognition. *Pt could d/c home if she had near 24/7 supervision to assist with IADLs, meals and general safety.   Pt's daughter wishes that pt moved to ALF to have the supervision she needs, her current ILF does not have an ALF section.      Recommendations for follow up therapy are one component of a multi-disciplinary discharge planning process, led by the attending physician.  Recommendations may be updated based on patient status, additional functional criteria and insurance authorization.   Follow Up Recommendations  Skilled nursing-short term rehab (<3 hours/day)    Assistance Recommended at Discharge Frequent or constant Supervision/Assistance  Patient can return home with the following Assist for transportation;Assistance with  cooking/housework;Assistance with feeding;Direct supervision/assist for medications management    Functional Status Assessment  Patient has had a recent decline in their functional status and demonstrates the ability to make significant improvements in function in a reasonable and predictable amount of time.  Equipment Recommendations  None recommended by OT    Recommendations for Other Services       Precautions / Restrictions Precautions Precautions: Fall Precaution Comments: HOH, wearing hearing devices Restrictions Weight Bearing Restrictions: No      Mobility Bed Mobility Overal bed mobility: Needs Assistance Bed Mobility: Supine to Sit, Sit to Supine     Supine to sit: Supervision Sit to supine: Supervision   General bed mobility comments: HOB elevated    Transfers Overall transfer level: Needs assistance Equipment used: Rollator (4 wheels) Transfers: Sit to/from Stand Sit to Stand: Supervision           General transfer comment: supervision from bed, min guard from lower toilet      Balance Overall balance assessment: Needs assistance Sitting-balance support: Feet supported Sitting balance-Leahy Scale: Good     Standing balance support: No upper extremity supported, During functional activity Standing balance-Leahy Scale: Fair Standing balance comment: standing at the sink to groom bimanually                           ADL either performed or assessed with clinical judgement   ADL Overall ADL's : Needs assistance/impaired Eating/Feeding: Set up;Sitting   Grooming: Supervision/safety;Standing Grooming Details (indicate cue type and reason): atnding at the sink, incrased time for problem solving. minimal cues for sequencing Upper Body Bathing: Set  up;Sitting   Lower Body Bathing: Min guard;Sit to/from stand   Upper Body Dressing : Set up;Sitting   Lower Body Dressing: Min guard;Sit to/from stand   Toilet Transfer:  Supervision/safety;Ambulation Toilet Transfer Details (indicate cue type and reason): with rollator Toileting- Clothing Manipulation and Hygiene: Modified independent;Sitting/lateral lean       Functional mobility during ADLs: Supervision/safety (rollator) General ADL Comments: Pt demonstrated great physical ability to complete ADLs wtih rollator. She requried minimal cueing for problem solving and sequeincing     Vision Baseline Vision/History: 0 No visual deficits Ability to See in Adequate Light: 0 Adequate Patient Visual Report: No change from baseline Vision Assessment?: No apparent visual deficits     Perception     Praxis      Pertinent Vitals/Pain Pain Assessment Pain Assessment: No/denies pain     Hand Dominance Right   Extremity/Trunk Assessment Upper Extremity Assessment Upper Extremity Assessment: Generalized weakness   Lower Extremity Assessment Lower Extremity Assessment: Generalized weakness   Cervical / Trunk Assessment Cervical / Trunk Assessment: Kyphotic   Communication Communication Communication: HOH   Cognition Arousal/Alertness: Awake/alert Behavior During Therapy: WFL for tasks assessed/performed Overall Cognitive Status: Impaired/Different from baseline Area of Impairment: Attention, Memory, Following commands, Safety/judgement, Awareness, Problem solving                   Current Attention Level: Selective Memory: Decreased short-term memory Following Commands: Follows one step commands consistently Safety/Judgement: Decreased awareness of safety Awareness: Emergent Problem Solving: Slow processing, Requires verbal cues General Comments: Pt had difficulty recalling home set up. Required verbal cues for sequencing grooming at the sink. Followed all one step commands. Pt's daughter reports that her cognition has been declining over the past year and raises concern for being home alone     General Comments  VSS on RA, daughter  present and supportive    Exercises     Shoulder Instructions      Home Living Family/patient expects to be discharged to:: Private residence Living Arrangements: Alone Available Help at Discharge: Family;Available PRN/intermittently Type of Home: Apartment Home Access: Elevator     Home Layout: One level     Bathroom Shower/Tub: Producer, television/film/video: Handicapped height Bathroom Accessibility: Yes How Accessible: Accessible via walker Home Equipment: Rollator (4 wheels);Shower seat   Additional Comments: daughter is supportive but works Nurse, mental health and is unable to provide 24/7      Prior Functioning/Environment Prior Level of Function : Needs assist  Cognitive Assist : ADLs (cognitive)   ADLs (Cognitive): Intermittent cues Physical Assist : Mobility (physical) Mobility (physical): Gait ADLs (physical): Feeding;IADLs Mobility Comments: using a tripod rollator ADLs Comments: indep for BADLs. Requires cognitive assist for medication, meals and safety. Pt's daughter reports pt is unable to use a phone (confuses it with a remote)        OT Problem List: Decreased strength;Decreased range of motion;Decreased activity tolerance;Decreased cognition;Decreased safety awareness      OT Treatment/Interventions: Self-care/ADL training;DME and/or AE instruction;Therapeutic activities;Patient/family education;Balance training;Cognitive remediation/compensation    OT Goals(Current goals can be found in the care plan section) Acute Rehab OT Goals Patient Stated Goal: to go home OT Goal Formulation: With patient Time For Goal Achievement: 10/05/21 Potential to Achieve Goals: Good ADL Goals Pt Will Perform Grooming: with modified independence;standing Pt Will Perform Tub/Shower Transfer: with modified independence;ambulating;Shower transfer Pt/caregiver will Perform Home Exercise Program: Increased ROM;Increased strength;Both right and left upper extremity;With written  HEP provided;With Supervision Additional ADL Goal #  1: Pt will indep complete cognitive medication management IADL task  OT Frequency: Min 2X/week       AM-PAC OT "6 Clicks" Daily Activity     Outcome Measure Help from another person eating meals?: A Little Help from another person taking care of personal grooming?: A Little Help from another person toileting, which includes using toliet, bedpan, or urinal?: A Little Help from another person bathing (including washing, rinsing, drying)?: A Little Help from another person to put on and taking off regular upper body clothing?: None Help from another person to put on and taking off regular lower body clothing?: A Little 6 Click Score: 19   End of Session Equipment Utilized During Treatment: Rollator (4 wheels) Nurse Communication: Mobility status  Activity Tolerance: Patient tolerated treatment well Patient left: in bed;with call bell/phone within reach;with nursing/sitter in room;with family/visitor present  OT Visit Diagnosis: Unsteadiness on feet (R26.81);Other abnormalities of gait and mobility (R26.89);Muscle weakness (generalized) (M62.81);Adult, failure to thrive (R62.7)                Time: 1030-1049 OT Time Calculation (min): 19 min Charges:  OT General Charges $OT Visit: 1 Visit OT Evaluation $OT Eval Moderate Complexity: 1 Mod   Michial Disney A Jenille Laszlo 09/21/2021, 11:09 AM

## 2021-09-21 NOTE — Assessment & Plan Note (Addendum)
Secondary to left parietal infarct in the setting of  mild cognitive impairment CT head and MRI brain (09/14/21) unremarkable for any acute intracranial abnormality Repeat MRI brain last night showed Patchy acute infarction in the low left parietal lobe Echo showed EF of 70 to 75%, grade 2 diastolic dysfunction, with moderately elevated pulmonary artery systolic pressure 58.4 mmHG Rule out any infectious causes, UA with trace leukocytes, rare bacteria,urine culture on 09/15/2021 no growth Chest x-ray X 2, no active disease Vit B12 369, RPR non-reactive Neurology consulted,appreciate recs, currently waiting for CTA head and neck.   A1c, 5.8, LDL 51 Telemetry,frequent neurochecks PT/OT/SLP- SNF

## 2021-09-21 NOTE — Assessment & Plan Note (Signed)
Well controlled.  No changes in medications.

## 2021-09-21 NOTE — Progress Notes (Addendum)
STROKE TEAM PROGRESS NOTE   INTERVAL HISTORY Her daughter is at the bedside. Pt stated that she felt better physically today but not mentally. Cough and phlegm much improved and able to lie flat during exam. Not arm or leg weakness, pt said today is a good day for her but her symptoms fluctuate without clear aggravating or alleviating factors.    Vitals:   09/20/21 2322 09/21/21 0325 09/21/21 0817 09/21/21 1012  BP: (!) 183/64 (!) 169/65  (!) 160/59  Pulse: 70 74  65  Resp: 17 18  16   Temp: 97.6 F (36.4 C) 97.8 F (36.6 C)  98.5 F (36.9 C)  TempSrc: Oral Axillary  Oral  SpO2: 96% 95% 97% 95%  Weight:      Height:       CBC:  Recent Labs  Lab 09/14/21 1512 09/15/21 1629 09/20/21 0338 09/21/21 0049  WBC 8.5   < > 6.9 7.7  NEUTROABS 6.4  --   --  4.9  HGB 13.3   < > 11.4* 11.7*  HCT 41.0   < > 36.1 34.2*  MCV 98.1   < > 100.8* 95.3  PLT 232   < > 183 214   < > = values in this interval not displayed.   Basic Metabolic Panel:  Recent Labs  Lab 09/20/21 0338 09/21/21 0049  NA 138 134*  K 3.1* 3.2*  CL 107 103  CO2 21* 21*  GLUCOSE 104* 106*  BUN 7* 8  CREATININE 0.52 0.59  CALCIUM 9.3 9.5  MG 1.8  --    Lipid Panel:  Recent Labs  Lab 09/20/21 0338  CHOL 116  TRIG 99  HDL 45  CHOLHDL 2.6  VLDL 20  LDLCALC 51   HgbA1c:  Recent Labs  Lab 09/20/21 0338  HGBA1C 5.8*   Urine Drug Screen:  No results for input(s): LABOPIA, COCAINSCRNUR, LABBENZ, AMPHETMU, THCU, LABBARB in the last 168 hours.   Alcohol Level  No results for input(s): ETH in the last 168 hours.   IMAGING past 24 hours DG Chest Port 1 View  Result Date: 09/20/2021 CLINICAL DATA:  Dyspnea.  History COPD EXAM: PORTABLE CHEST 1 VIEW COMPARISON:  09/19/2021 FINDINGS: Heart size and vascularity within normal limits. Atherosclerotic calcification thoracic aorta. Lungs are clear without infiltrate or effusion. IMPRESSION: No active disease. Electronically Signed   By: Franchot Gallo M.D.   On:  09/20/2021 13:12   ECHOCARDIOGRAM COMPLETE  Result Date: 09/20/2021    ECHOCARDIOGRAM REPORT   Patient Name:   Linda [name] Date of Exam: 09/20/2021 Medical Rec #:  [id number]     Height:       65.0 in Accession #:    [id number]    Weight:       125.7 lb Date of Birth:  Dec 30, 1923     BSA:          1.624 m Patient Age:    88 years      BP:           127/68 mmHg Patient Gender: F             HR:           72 bpm. Exam Location:  Inpatient Procedure: 2D Echo Indications:    TIA  History:        Patient has prior history of Echocardiogram examinations, most                 recent 09/30/2019. Signs/Symptoms:Altered Mental Status;  Risk                 Factors:Hypertension.  Sonographer:    Johny Chess RDCS Referring Phys: [id number] [place]  1. Left ventricular ejection fraction, by estimation, is 70 to 75%. The left ventricle has hyperdynamic function. The left ventricle has no regional wall motion abnormalities. Left ventricular diastolic parameters are consistent with Grade II diastolic dysfunction (pseudonormalization). Elevated left atrial pressure.  2. Right ventricular systolic function is normal. The right ventricular size is normal. There is moderately elevated pulmonary artery systolic pressure. The estimated right ventricular systolic pressure is [id number] mmHg.  3. The mitral valve is normal in structure. Trivial mitral valve regurgitation.  4. Tricuspid valve regurgitation is mild to moderate.  5. The aortic valve is tricuspid. There is mild calcification of the aortic valve. Aortic valve regurgitation is mild. Aortic valve sclerosis is present, with no evidence of aortic valve stenosis.  6. The inferior vena cava is normal in size with <50% respiratory variability, suggesting right atrial pressure of 8 mmHg. Comparison(s): Prior images reviewed side by side. The left ventricular diastolic function is significantly worse. There is now evidence of increased mean left atrial pressure,  otherwise no significant change. FINDINGS  Left Ventricle: Left ventricular ejection fraction, by estimation, is 70 to 75%. The left ventricle has hyperdynamic function. The left ventricle has no regional wall motion abnormalities. The left ventricular internal cavity size was normal in size. There is borderline concentric left ventricular hypertrophy. Left ventricular diastolic parameters are consistent with Grade II diastolic dysfunction (pseudonormalization). Elevated left atrial pressure. Right Ventricle: The right ventricular size is normal. No increase in right ventricular wall thickness. Right ventricular systolic function is normal. There is moderately elevated pulmonary artery systolic pressure. The tricuspid regurgitant velocity is 3.55 m/s, and with an assumed right atrial pressure of 8 mmHg, the estimated right ventricular systolic pressure is [id number] mmHg. Left Atrium: Left atrial size was normal in size. Right Atrium: Right atrial size was normal in size. Pericardium: There is no evidence of pericardial effusion. Mitral Valve: The mitral valve is normal in structure. Trivial mitral valve regurgitation. Tricuspid Valve: The tricuspid valve is normal in structure. Tricuspid valve regurgitation is mild to moderate. Aortic Valve: The aortic valve is tricuspid. There is mild calcification of the aortic valve. Aortic valve regurgitation is mild. Aortic regurgitation PHT measures 361 msec. Aortic valve sclerosis is present, with no evidence of aortic valve stenosis. Pulmonic Valve: The pulmonic valve was not well visualized. Pulmonic valve regurgitation is not visualized. Aorta: The aortic root is normal in size and structure. Venous: The inferior vena cava is normal in size with less than 50% respiratory variability, suggesting right atrial pressure of 8 mmHg. IAS/Shunts: No atrial level shunt detected by color flow Doppler.  LEFT VENTRICLE PLAX 2D LVIDd:         3.70 cm   Diastology LVIDs:         2.20 cm   LV  e' medial:    4.46 cm/s LV PW:         1.20 cm   LV E/e' medial:  22.6 LV IVS:        1.00 cm   LV e' lateral:   4.68 cm/s LVOT diam:     1.70 cm   LV E/e' lateral: 21.6 LV SV:         56 LV SV Index:   34 LVOT Area:     2.27 cm  RIGHT  VENTRICLE             IVC RV S prime:     15.10 cm/s  IVC diam: 1.90 cm TAPSE (M-mode): 2.0 cm LEFT ATRIUM             Index        RIGHT ATRIUM           Index LA diam:        3.30 cm 2.03 cm/m   RA Area:     11.10 cm LA Vol (A2C):   47.6 ml 29.32 ml/m  RA Volume:   21.90 ml  13.49 ml/m LA Vol (A4C):   46.8 ml 28.82 ml/m LA Biplane Vol: 48.7 ml 30.00 ml/m  AORTIC VALVE LVOT Vmax:   118.00 cm/s LVOT Vmean:  79.800 cm/s LVOT VTI:    0.246 m AI PHT:      361 msec  AORTA Ao Root diam: 2.70 cm Ao Asc diam:  3.20 cm MITRAL VALVE                TRICUSPID VALVE MV Area (PHT): 3.17 cm     TR Peak grad:   50.4 mmHg MV Decel Time: 239 msec     TR Vmax:        355.00 cm/s MV E velocity: 101.00 cm/s MV A velocity: 116.00 cm/s  SHUNTS MV E/A ratio:  0.87         Systemic VTI:  0.25 m                             Systemic Diam: 1.70 cm Rachelle HoraMihai Croitoru MD Electronically signed by Thurmon FairMihai Croitoru MD Signature Date/Time: 09/20/2021/3:17:25 PM    Final     PHYSICAL EXAM  Physical Exam  Constitutional: Appears frail, elderly.  Psych: depressed mood Cardiovascular: Normal rate and regular rhythm.  Respiratory: Effort normal, non-labored breathing  Neuro - patient in depressed mood, awake alert, orientated to month, people and age, however not orientated to year or place, said "2022" and "rehab".  No aphasia, paucity of speech, follows most simple commands, able to name and repeat.  No gaze palsy, visual field full, however complaining of right visual field decreased visual acuity.  facial symmetrical.  Tongue midline.  Bilateral upper extremity at least 4/5, symmetrical, right lower extremity 4/5, symmetrical  Sensation intact, finger-to-nose grossly intact  bilaterally.  ASSESSMENT/PLAN Linda [name] is a 88 y.o. female with history of hypertension, hyperlipidemia, COPD, glaucoma presenting with intermittent/recurrent confusion, difficulty finding words, poor appetite, generalized weakness, and a sensation of not feeling good that has been going on since 09/13/2021.   Encephalopathy with deconditioning and FTT, need to rule out stroke Code Stroke no acute intracranial abnormalities, atrophy with small vessel chronic ischemic changes  CTA head & neck Pending MRI (09/14/2021) no acute intracranial processes/infarcts. Foci of hemosiderin deposition in the left-greater-than-right MRI 2/7- pending 2D Echo EF 70 to 75% LDL 51 HgbA1c 5.8 VTE prophylaxis -Lovenox No antithrombotic prior to admission, now on aspirin 81 mg daily.  Therapy recommendations: Pending, No SLP follow up Disposition: Pending  Hypertension Home meds: Sotalol, amlodipine Stable Long-term BP goal normotensive As needed hydralazine  Hyperlipidemia Home meds: Rosuvastatin 20 mg, resumed in hospital LDL 51, goal < 70 Continue statin on discharge  Other Stroke Risk Factors Advanced Age >/= 4365  Former cigarette smoker Coronary artery disease Migraines Congestive heart failure  Other Active Problems Depression/anxiety COPD with cough  and phlegm, improved Pulmonary hygiene Cough, turn, deep breath, up out of bed as tolerated Dounebs and mucinex per primary team [facility] day # 2   Rosalin Hawking, MD PhD Stroke Neurology 09/21/2021 11:48 AM    To contact Stroke Continuity provider, please refer to [email or web address] After hours, contact General Neurology

## 2021-09-21 NOTE — Assessment & Plan Note (Signed)
Last echocardiogram shows Left ventricular ejection fraction, 70 to 75% with hyperdynamic function. The left ventricle has no  regional wall motion abnormalities. Left ventricular diastolic parameters  are consistent with Grade II diastolic  dysfunction (pseudonormalization). There is moderately elevated pulmonary artery systolic  pressure. The estimated right ventricular systolic pressure is [id number] mmHg.  she remains on RA,.

## 2021-09-22 ENCOUNTER — Inpatient Hospital Stay (HOSPITAL_COMMUNITY): Payer: Medicare Other

## 2021-09-22 DIAGNOSIS — J479 Bronchiectasis, uncomplicated: Secondary | ICD-10-CM

## 2021-09-22 DIAGNOSIS — I6330 Cerebral infarction due to thrombosis of unspecified cerebral artery: Secondary | ICD-10-CM | POA: Insufficient documentation

## 2021-09-22 LAB — BASIC METABOLIC PANEL
Anion gap: 9 (ref 5–15)
BUN: 9 mg/dL (ref 8–23)
CO2: 22 mmol/L (ref 22–32)
Calcium: 9.7 mg/dL (ref 8.9–10.3)
Chloride: 103 mmol/L (ref 98–111)
Creatinine, Ser: 0.61 mg/dL (ref 0.44–1.00)
GFR, Estimated: >60 mL/min (ref >60)
Glucose, Bld: 103 mg/dL — ABNORMAL HIGH (ref 70–99)
Potassium: 3.7 mmol/L (ref 3.5–5.1)
Sodium: 134 mmol/L — ABNORMAL LOW (ref 135–145)

## 2021-09-22 LAB — CBC WITH DIFFERENTIAL/PLATELET
Abs Immature Granulocytes: 0.02 10*3/uL (ref 0.00–0.07)
Basophils Absolute: 0 10*3/uL (ref 0.0–0.1)
Basophils Relative: 0 %
Eosinophils Absolute: 0.3 10*3/uL (ref 0.0–0.5)
Eosinophils Relative: 3 %
HCT: 34.5 % — ABNORMAL LOW (ref 36.0–46.0)
Hemoglobin: 11.3 g/dL — ABNORMAL LOW (ref 12.0–15.0)
Immature Granulocytes: 0 %
Lymphocytes Relative: 26 %
Lymphs Abs: 2 10*3/uL (ref 0.7–4.0)
MCH: 31.7 pg (ref 26.0–34.0)
MCHC: 32.8 g/dL (ref 30.0–36.0)
MCV: 96.6 fL (ref 80.0–100.0)
Monocytes Absolute: 0.7 10*3/uL (ref 0.1–1.0)
Monocytes Relative: 9 %
Neutro Abs: 4.7 10*3/uL (ref 1.7–7.7)
Neutrophils Relative %: 62 %
Platelets: 213 10*3/uL (ref 150–400)
RBC: 3.57 MIL/uL — ABNORMAL LOW (ref 3.87–5.11)
RDW: 12.2 % (ref 11.5–15.5)
WBC: 7.7 10*3/uL (ref 4.0–10.5)
nRBC: 0 % (ref 0.0–0.2)

## 2021-09-22 IMAGING — CT CT ANGIO HEAD-NECK (W OR W/O PERF)
3 of 4 series · 12 of 30 positions shown · non-contrast
Comparison: CT head [DATE].  MRI head [DATE]

CLINICAL DATA: Stroke.  Determine embolic source.

EXAM:
CT ANGIOGRAPHY HEAD AND NECK
TECHNIQUE: Multidetector CT imaging of the head and neck was performed using
the standard protocol during bolus administration of intravenous
contrast. Multiplanar CT image reconstructions and MIPs were
obtained to evaluate the vascular anatomy. Carotid stenosis
measurements (when applicable) are obtained utilizing NASCET
criteria, using the distal internal carotid diameter as the
denominator.

[Series 6: head bone · axial · 0.32mm/px · z∈[-181,-83]mm · 4 of 83 slices shown]
[im 17/83  bone]
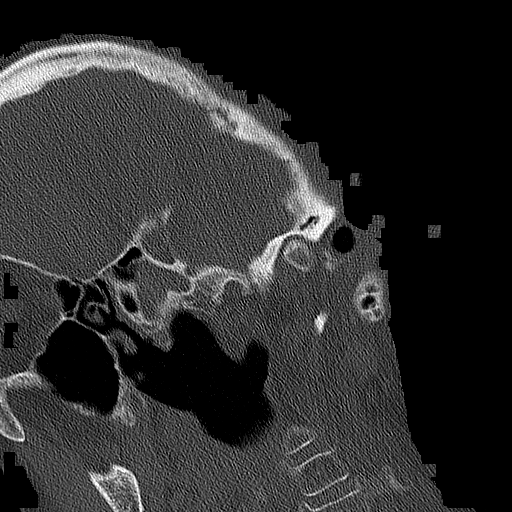
[im 33/83  bone]
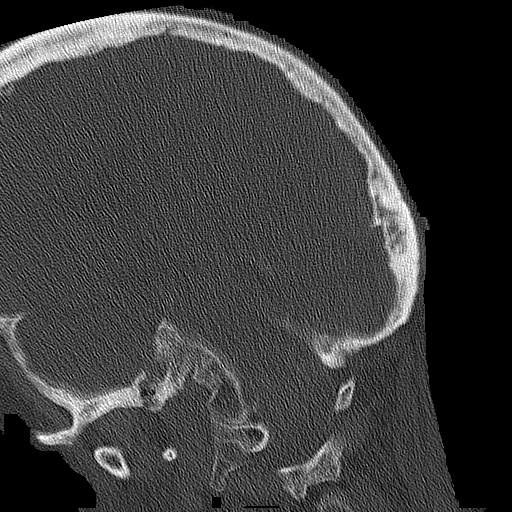
[im 50/83  bone]
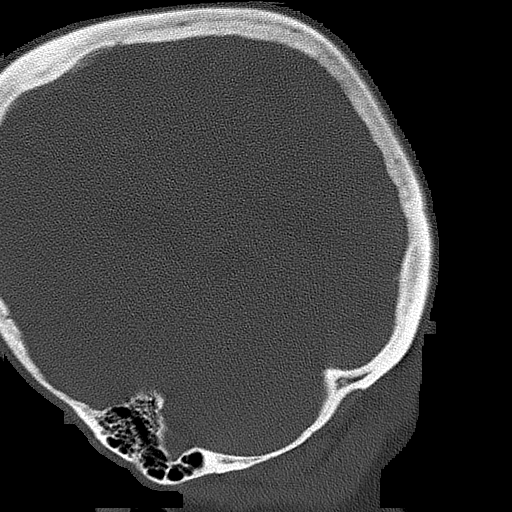
[im 66/83  bone]
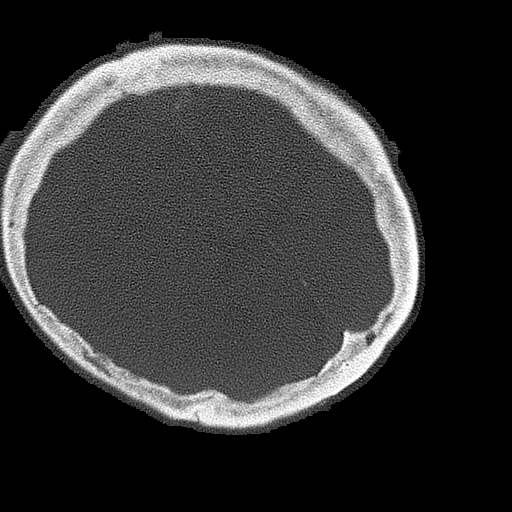

[Series 10: cta neck · axial · 0.74mm/px · z∈[-234,-74]mm · 6 of 113 slices shown]
[im 17/113  soft-tissue]
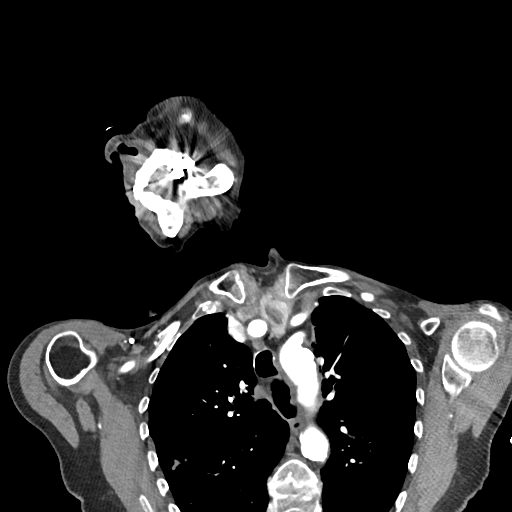
[im 33/113  bone]
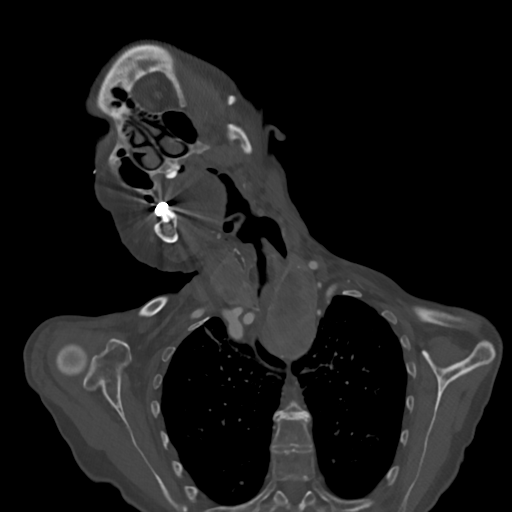
[im 49/113  soft-tissue]
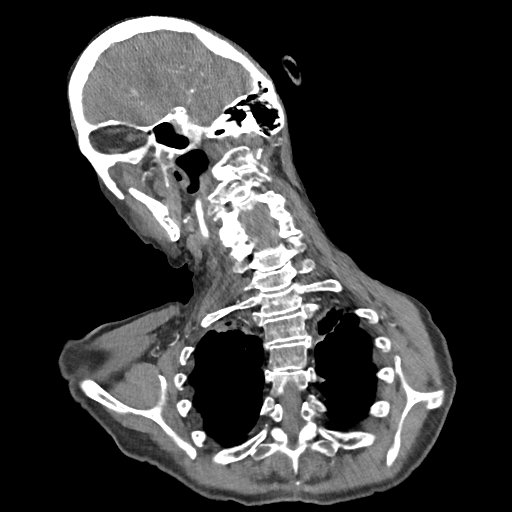
[im 65/113  bone]
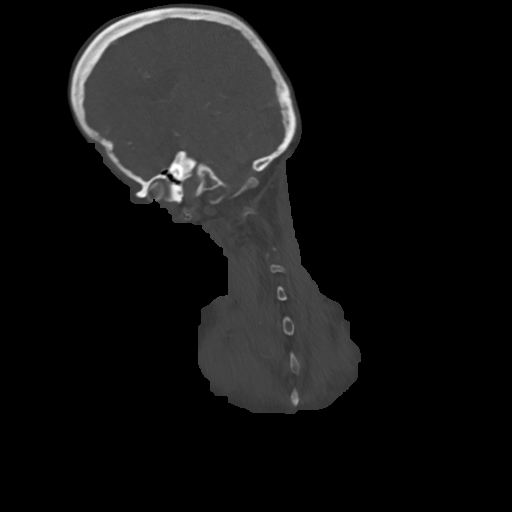
[im 81/113  soft-tissue]
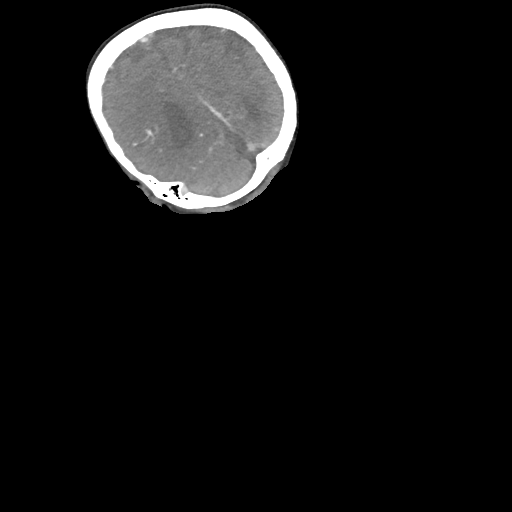
[im 97/113  bone]
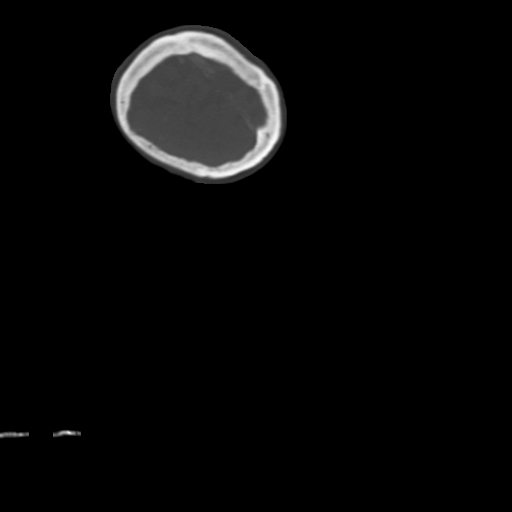

[Series 14: cta neck sagittal · sagittal · 0.47mm/px · 2 of 201 slices shown]
[im 83/201  soft-tissue]
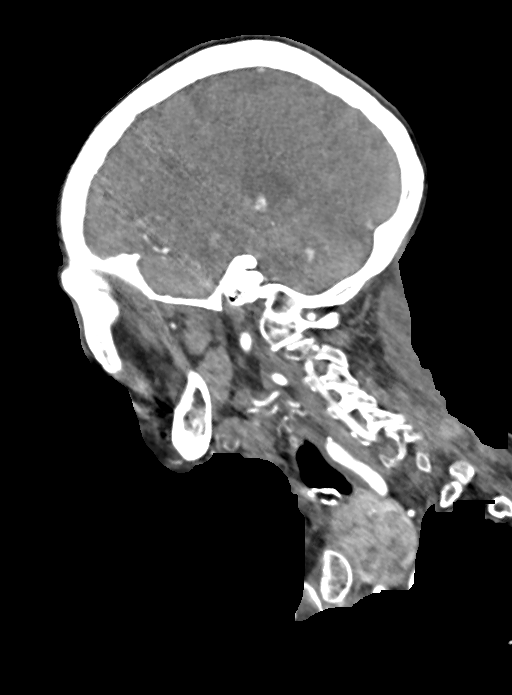
[im 119/201  soft-tissue]
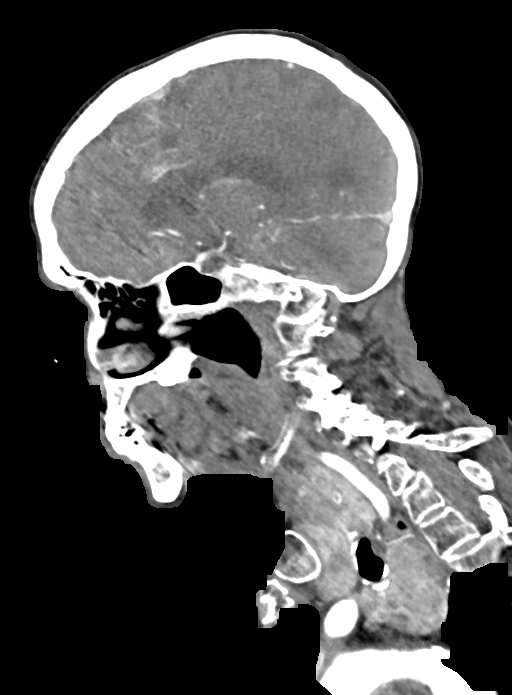

[12 of 30 positions shown; findings below may reference images not displayed]

RADIATION DOSE REDUCTION: This exam was performed according to the
departmental dose-optimization program which includes automated
exposure control, adjustment of the mA and/or kV according to
patient size and/or use of iterative reconstruction technique.

CONTRAST:  65mL OMNIPAQUE IOHEXOL 350 MG/ML SOLN
FINDINGS: CT HEAD FINDINGS

Brain: Limited image quality. The patient had considerable pain and
was not able to hold still. Patient has thoracic kyphosis and could
not lay flat. There is extensive streak artifact in the brain.

Ventricle size normal. No acute hemorrhage or mass. Restricted
diffusion in the left parietal lobe is not identified by CT.

Review of the MIP images confirms the above findings

CTA NECK FINDINGS

Aortic arch: Standard branching. Imaged portion shows no evidence of
aneurysm or dissection. No significant stenosis of the major arch
vessel origins. Mild atherosclerotic disease aortic arch and
proximal great vessels

Right carotid system: Atherosclerotic calcification right carotid
bifurcation. 25% diameter stenosis proximal right internal carotid
artery

Left carotid system: Mild atherosclerotic disease left carotid
bifurcation without stenosis

Vertebral arteries: Both vertebral arteries patent to the basilar
without stenosis

Skeleton: Cervical spondylosis. No acute skeletal abnormality.
Cervicothoracic kyphosis.

Other neck: Enlarged thyroid bilaterally left greater than right.
Substernal goiter extends into the superior mediastinum on the left
splay sing the trachea to the right.

Upper chest: Mild scarring in the apices.  No acute abnormality.

Suboptimal image quality due to difficulty positioning the patient
who was kyphotic and not able to lie flat.

Review of the MIP images confirms the above findings

CTA HEAD FINDINGS

Anterior circulation: Internal carotid artery patent through the
skull base and cavernous segment with mild atherosclerotic disease.
Anterior and middle cerebral arteries patent bilaterally without
stenosis or occlusion. No aneurysm.

Posterior circulation: Both vertebral arteries are patent to the
basilar. PICA not visualized. AICA patent bilaterally. Basilar
patent bilaterally. Posterior cerebral arteries patent bilaterally.
Fetal origin right posterior cerebral artery.

Venous sinuses: Normal venous enhancement

Anatomic variants: None

Image quality is suboptimal due to streak artifact from patient
positioning

Review of the MIP images confirms the above findings
IMPRESSION: 1. Limited study. The patient is kyphotic and not able to lie flat.
Patient not able to hold still. There is considerable artifact on
the study with decreased spatial resolution.
2. Restricted diffusion left parietal lobe seen on MRI not
visualized by CT. No acute hemorrhage.
3. Negative for intracranial large vessel occlusion or flow limiting
stenosis
4. 25% diameter stenosis proximal right internal carotid artery.
Left carotid widely patent
5. Both vertebral arteries widely patent.
6. Goiter with substernal extension on the left.

## 2021-09-22 MED ORDER — HYDROCORTISONE (PERIANAL) 2.5 % EX CREA
TOPICAL_CREAM | Freq: Three times a day (TID) | CUTANEOUS | Status: DC
  Filled 2021-09-22: qty 28.35

## 2021-09-22 MED ORDER — IOHEXOL 350 MG/ML SOLN
65.0000 mL | Freq: Once | INTRAVENOUS | Status: AC | PRN
  Administered 2021-09-22: 65 mL via INTRAVENOUS

## 2021-09-22 MED ORDER — PANTOPRAZOLE SODIUM 40 MG PO TBEC
40.0000 mg | DELAYED_RELEASE_TABLET | Freq: Every day | ORAL | Status: DC
  Administered 2021-09-22 – 2021-09-23 (×2): 40 mg via ORAL
  Filled 2021-09-22 (×2): qty 1

## 2021-09-22 MED ORDER — CLOPIDOGREL BISULFATE 75 MG PO TABS
75.0000 mg | ORAL_TABLET | Freq: Every day | ORAL | Status: DC
  Administered 2021-09-22 – 2021-09-23 (×2): 75 mg via ORAL
  Filled 2021-09-22 (×2): qty 1

## 2021-09-22 NOTE — Evaluation (Signed)
Clinical/Bedside Swallow Evaluation Patient Details  Name: Linda [name] MRN: [id number] Date of Birth: [birth year]  Today's Date: 09/22/2021 Time: SLP Start Time (ACUTE ONLY): 1513 SLP Stop Time (ACUTE ONLY): 1530 SLP Time Calculation (min) (ACUTE ONLY): 17 min  Past Medical History:  Past Medical History:  Diagnosis Date   Arthritis    COPD (chronic obstructive pulmonary disease) (HCC)    chronic cough   Glaucoma    Hyperlipidemia    Hypertension    Irregular heartbeat    Osteoporosis    Thyroid disease    enlarged thyroid   Past Surgical History:  Past Surgical History:  Procedure Laterality Date   APPENDECTOMY     cateract surgery     COLONOSCOPY     Done in [place]  yrs ago   UPPER GASTROINTESTINAL ENDOSCOPY  2019   WRIST SURGERY     HPI:  Linda [name] is a 88 y.o. female with medical history significant of HTN, HLD, COPD, glaucoma presents to the ED with multiple complaints including intermittent/recurrent confusion, difficulty finding words, poor appetite, generalized weakness and a sensation of not feeling good that has been ongoing for the past 1 week.  CT head negative for acute changes, CXR clear. BSE 2/7 suspected esophageal dysphagia. BSE reordered due to pt's symptoms/complaints worsening.    Assessment / Plan / Recommendation  Clinical Impression  Pt seen for second BSE this week with family friend at bedside. Pt complains of pharyngeal globus sensation, coughing when supine, coughing "for no reason".She denies GER but takes 2 Pepcid at home and is receiving 1 Pepcid since in hospital. On two occasions pt suddently began coughing once without having liquid prior to episode and once 10 min after. Pt does have a goiter and had a neck/chest CT (results not in). Pt said politely "you can't convince me there isn't something in my throat." There were no s/sx aspiration with 3-4 sips straw sips. She declined applesauce and solid. SLP highly suspects an esophageal dysphagia given  clinical observations and pt reports. Recommend continue regular texture, thin liquids and esophageal precautions. SLP saw Dr. Blake Divine in the hall and updated her on results and recommndations one being adding more Pepcid if medically appropriate. No f/u needed. SLP Visit Diagnosis: Dysphagia, unspecified (R13.10)    Aspiration Risk  Mild aspiration risk    Diet Recommendation Regular;Thin liquid   Liquid Administration via: Cup;Straw Medication Administration: Whole meds with liquid Supervision: Patient able to self feed Postural Changes: Seated upright at 90 degrees;Remain upright for at least 30 minutes after po intake    Other  Recommendations Oral Care Recommendations: Oral care BID    Recommendations for follow up therapy are one component of a multi-disciplinary discharge planning process, led by the attending physician.  Recommendations may be updated based on patient status, additional functional criteria and insurance authorization.  Follow up Recommendations No SLP follow up      Assistance Recommended at Discharge None  Functional Status Assessment    Frequency and Duration            Prognosis        Swallow Study   General Date of Onset: 09/19/21 HPI: Linda [name] is a 88 y.o. female with medical history significant of HTN, HLD, COPD, glaucoma presents to the ED with multiple complaints including intermittent/recurrent confusion, difficulty finding words, poor appetite, generalized weakness and a sensation of not feeling good that has been ongoing for the past 1 week.  CT head negative for acute changes,  CXR clear. BSE 2/7 suspected esophageal dysphagia. BSE reordered due to pt's symptoms/complaints worsening. Type of Study: Bedside Swallow Evaluation Previous Swallow Assessment:  (see HPI) Diet Prior to this Study: Regular;Thin liquids Temperature Spikes Noted: No Respiratory Status: Room air History of Recent Intubation: No Behavior/Cognition:  Alert;Cooperative;Pleasant mood Oral Cavity Assessment: Within Functional Limits Oral Care Completed by SLP: No Oral Cavity - Dentition: Adequate natural dentition Vision: Functional for self-feeding Self-Feeding Abilities: Able to feed self Patient Positioning: Upright in bed Baseline Vocal Quality: Normal Volitional Cough: Strong Volitional Swallow: Able to elicit    Oral/Motor/Sensory Function Overall Oral Motor/Sensory Function: Within functional limits   Ice Chips Ice chips: Not tested   Thin Liquid Thin Liquid: Within functional limits Presentation: Straw    Nectar Thick Nectar Thick Liquid: Not tested   Honey Thick Honey Thick Liquid: Not tested   Puree Puree: Not tested (pt declined)   Solid     Solid: Not tested (pt declined)      Royce Macadamia 09/22/2021,3:51 PM   Breck Coons [place].Ed Nurse, children's [phone] Office [phone]

## 2021-09-22 NOTE — Care Management Important Message (Signed)
Important Message  Patient Details  Name: Linda [name] MRN: [id number] Date of Birth: 1924-03-31   Medicare Important Message Given:  Yes     [name] 09/22/2021, 3:50 PM

## 2021-09-22 NOTE — Progress Notes (Signed)
[facility]                                                                               [employer], is a 88 y.o. female, DOB - [birth year], KU:[id number] Admit date - 09/19/2021    Outpatient Primary MD for the patient is Jerline Pain, Algis Greenhouse, MD  LOS - 3  days    Brief summary    Linda [name] is a 88 y.o. female with medical history significant of HTN, HLD, COPD, glaucoma presents to the ED with multiple complaints including intermittent/recurrent confusion, difficulty finding words, poor appetite, generalized weakness and a sensation of not feeling good that has been ongoing for the past 1 week.  Of note, patient first presented to the ED on 09/13/2021, at that time SBP noted to be in the 200s, with similar complaints, MRI and CT scans done at that time were negative for acute stroke. Discussions about admission for possible hypertensive encephalopathy was discussed with patient and the neurology consultation, but patient left AMA.  Patient returned to the ED on 09/14/2021 again with similar complaints, recommended for admission for further eval, pt declined and signed AMA again. On 09/15/21 pt followed up with PCP and basic lab work was done, reported resolved diarrhea. On 09/19/21, pt went to PCP again still with similar complaints and was advised to go to the ED. According to her daughter, patient was able to perform her ADLs in an independent living facility, ambulates with a rollator and was at her baseline up until 09/13/2021. Reports some productive cough of whitish sputum which has been ongoing for the past couple of days. In the ED, vital signs fairly stable except for some SBP's in the 190s, labs unremarkable except for sodium of 130, chloride 97, UA showed small hemoglobin, trace leukocytes, SG less than 1.005, rare bacteria.  CT head with no acute intracranial abnormalities.  EDP spoke to neurology, recommended admission and they will consult.  Patient admitted for further  management.    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  mild cognitive impairment CT head and MRI brain (09/14/21) unremarkable for any acute intracranial abnormality Repeat MRI brain last night showed Patchy acute infarction in the low left parietal lobe Echo showed EF of 70 to [id number], grade 2 diastolic dysfunction, with moderately elevated pulmonary artery systolic pressure [id number] mmHG Rule out any infectious causes, UA with trace leukocytes, rare bacteria, urine culture on 09/15/2021 no growth Chest x-ray X 2, no active disease Vit B12 369, RPR non-reactive Neurology consulted, appreciate recs, currently waiting for CTA head and neck.   A1c, 5.8, LDL 51 Telemetry, frequent neurochecks PT/OT/SLP- SNF    COPD (chronic obstructive pulmonary disease) ([place]) Continue with bronchodilators.  CXR is negative for pneumonia.  Continue with azithromycin for bronchiectasis.  She reports breathing has improved.  No wheezing on exam today.   Chronic diastolic CHF (congestive heart failure) ([place])- (present on admission) Last echocardiogram shows Left ventricular ejection fraction, 70 to 75% with hyperdynamic function. The left ventricle has no  regional wall motion abnormalities. Left ventricular diastolic  parameters  are consistent with Grade II diastolic  dysfunction (pseudonormalization). There is moderately elevated pulmonary artery systolic  pressure. The estimated right ventricular systolic pressure is [id number] mmHg.  she remains on RA,.   Hypokalemia Replaced. Recheck wnl.   Bronchiectasis without acute exacerbation ([place]) Continue with azithromycin for bronchiectasis.   Essential hypertension- (present on admission) Well controlled. No changes in medications.  Gastroesophageal reflux disease without esophagitis- (present on admission) Stable.  Hyperlipidemia- (present on  admission) Resume crestor       Estimated body mass index is 20.91 kg/m as calculated from the following:   Height as of this encounter: 5\' 5"  (1.651 m).   Weight as of this encounter: 57 kg.  Code Status: DNR DVT Prophylaxis:  enoxaparin (LOVENOX) injection 40 mg Start: 09/19/21 2230   Level of Care: Level of care: Telemetry Medical Family Communication: Updated patient's daughter at bedside  Disposition Plan:     Remains inpatient appropriate:  further work up for stroke  Procedures:  MRI brain  Consultants:   Neurology.  Antimicrobials:   Anti-infectives (From admission, onward)    Start     Dose/Rate Route Frequency Ordered Stop   09/21/21 1400  azithromycin (ZITHROMAX) tablet 500 mg        500 mg Oral Daily 09/21/21 1302 09/25/21 0959   09/20/21 1415  azithromycin (ZITHROMAX) 500 mg in sodium chloride 0.9 % 250 mL IVPB  Status:  Discontinued        500 mg 250 mL/hr over 60 Minutes Intravenous Every 24 hours 09/20/21 1412 09/21/21 1302        Medications  Scheduled Meds:  amLODipine  5 mg Oral Daily   aspirin EC  81 mg Oral Daily   azithromycin  500 mg Oral Daily   dextromethorphan-guaiFENesin  1 tablet Oral BID   enoxaparin (LOVENOX) injection  40 mg Subcutaneous Q24H   famotidine  20 mg Oral BID   hydrocortisone   Rectal TID   ipratropium-albuterol  3 mL Nebulization BID   rosuvastatin  20 mg Oral Daily   sotalol  80 mg Oral Q12H   timolol  1 drop Both Eyes Daily   Continuous Infusions: PRN Meds:.acetaminophen, albuterol, hydrALAZINE, ondansetron **OR** ondansetron (ZOFRAN) IV, polyethylene glycol    Subjective:   Linda [name] was seen and examined today. No sob or chest pain.  Objective:   Vitals:   09/21/21 2323 09/22/21 0813 09/22/21 0905 09/22/21 1149  BP: (!) 167/66  (!) 182/74 (!) 158/66  Pulse: 86 73 63 (!) 58  Resp: 14 17 18 18   Temp: 98.7 F (37.1 C)  97.8 F (36.6 C) 98.1 F (36.7 C)  TempSrc: Oral  Oral Oral  SpO2: 97% 99%  99% 98%  Weight:      Height:       No intake or output data in the 24 hours ending 09/22/21 1414 Filed Weights   09/19/21 1128  Weight: 57 kg     Exam General exam: Appears calm and comfortable  Respiratory system: Clear to auscultation. Respiratory effort normal. Cardiovascular system: S1 & S2 heard, RRR.  No pedal edema. Gastrointestinal system: Abdomen is nondistended, soft and nontender. Normal bowel sounds heard. Central nervous system: Alert but confused.  Extremities: Symmetric 5 x 5 power. Skin: No rashes, lesions or ulcers Psychiatry: cannot be assessed.    Data Reviewed:  I have personally reviewed following labs and imaging studies   CBC Lab Results  Component Value Date   WBC 7.7 09/22/2021  RBC 3.57 (L) 09/22/2021   HGB 11.3 (L) 09/22/2021   HCT 34.5 (L) 09/22/2021   MCV 96.6 09/22/2021   MCH 31.7 09/22/2021   PLT 213 09/22/2021   MCHC 32.8 09/22/2021   RDW 12.2 09/22/2021   LYMPHSABS 2.0 09/22/2021   MONOABS 0.7 09/22/2021   EOSABS 0.3 09/22/2021   BASOSABS 0.0 09/22/2021     Last metabolic panel Lab Results  Component Value Date   NA 134 (L) 09/22/2021   K 3.7 09/22/2021   CL 103 09/22/2021   CO2 22 09/22/2021   BUN 9 09/22/2021   CREATININE 0.61 09/22/2021   GLUCOSE 103 (H) 09/22/2021   GFRNONAA >60 09/22/2021   GFRAA 89 06/23/2020   CALCIUM 9.7 09/22/2021   PROT 5.5 (L) 09/20/2021   ALBUMIN 3.2 (L) 09/20/2021   BILITOT 0.4 09/20/2021   ALKPHOS 60 09/20/2021   AST 21 09/20/2021   ALT 17 09/20/2021   ANIONGAP 9 09/22/2021    CBG (last 3)  Recent Labs    09/19/21 1541  GLUCAP 103*      Coagulation Profile: No results for input(s): INR, PROTIME in the last 168 hours.   Radiology Studies: MR BRAIN WO CONTRAST  Result Date: 09/21/2021 CLINICAL DATA:  Stroke follow-up EXAM: MRI HEAD WITHOUT CONTRAST TECHNIQUE: Multiplanar, multiecho pulse sequences of the brain and surrounding structures were obtained without intravenous  contrast. COMPARISON:  Head CT from 2 days ago.  Brain MRI 09/14/2021 FINDINGS: Brain: Patchy acute infarction in the left parietal lobe, primarily white matter with extension from subcortical white matter to the lateral ventricle. Branching hypointensity on gradient and T2 weighted imaging with nodular areas of decreased gradient signal in the left cerebellum, a large developmental venous anomaly with chronic congestive/microhemorrhages versus associated cavernomas/capillary telangiectasias. DVA appearance was confirmed on a 05/26/2020 CT with contrast. Remote insult with hemosiderin staining in the deep right cerebellum. Mild for age chronic small vessel ischemia and cerebral volume loss. Chronic CSF accumulation posterior to the left more than right cerebellum. No hydrocephalus or collection. Vascular: Left cerebellar findings as above. Skull and upper cervical spine: Normal marrow signal Sinuses/Orbits: Bilateral cataract resection. Chronic left sphenoid sinusitis with complete opacification by inspissated material. IMPRESSION: 1. Patchy acute infarction in the low left parietal lobe. 2. 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 Electronically Signed   By: Tiburcio PeaJonathan  Watts M.D.   On: 09/21/2021 19:03   ECHOCARDIOGRAM COMPLETE  Result Date: 09/20/2021    ECHOCARDIOGRAM REPORT   Patient Name:   Linda [name] Date of Exam: 09/20/2021 Medical Rec #:  [id number]     Height:       65.0 in Accession #:    [id number]    Weight:       125.7 lb Date of Birth:  Jan 05, 1924     BSA:          1.624 m Patient Age:    89 years      BP:           127/68 mmHg Patient Gender: F             HR:           72 bpm. Exam Location:  Inpatient Procedure: 2D Echo Indications:    TIA  History:        Patient has prior history of Echocardiogram examinations, most                 recent 09/30/2019. Signs/Symptoms:Altered Mental Status; Risk  Factors:Hypertension.  Sonographer:    Johny Chess RDCS  Referring Phys: [id number]  1. Left ventricular ejection fraction, by estimation, is 70 to 75%. The left ventricle has hyperdynamic function. The left ventricle has no regional wall motion abnormalities. Left ventricular diastolic parameters are consistent with Grade II diastolic dysfunction (pseudonormalization). Elevated left atrial pressure.  2. Right ventricular systolic function is normal. The right ventricular size is normal. There is moderately elevated pulmonary artery systolic pressure. The estimated right ventricular systolic pressure is [id number] mmHg.  3. The mitral valve is normal in structure. Trivial mitral valve regurgitation.  4. Tricuspid valve regurgitation is mild to moderate.  5. The aortic valve is tricuspid. There is mild calcification of the aortic valve. Aortic valve regurgitation is mild. Aortic valve sclerosis is present, with no evidence of aortic valve stenosis.  6. The inferior vena cava is normal in size with <50% respiratory variability, suggesting right atrial pressure of 8 mmHg. Comparison(s): Prior images reviewed side by side. The left ventricular diastolic function is significantly worse. There is now evidence of increased mean left atrial pressure, otherwise no significant change. FINDINGS  Left Ventricle: Left ventricular ejection fraction, by estimation, is 70 to 75%. The left ventricle has hyperdynamic function. The left ventricle has no regional wall motion abnormalities. The left ventricular internal cavity size was normal in size. There is borderline concentric left ventricular hypertrophy. Left ventricular diastolic parameters are consistent with Grade II diastolic dysfunction (pseudonormalization). Elevated left atrial pressure. Right Ventricle: The right ventricular size is normal. No increase in right ventricular wall thickness. Right ventricular systolic function is normal. There is moderately elevated pulmonary artery systolic pressure. The  tricuspid regurgitant velocity is 3.55 m/s, and with an assumed right atrial pressure of 8 mmHg, the estimated right ventricular systolic pressure is [id number] mmHg. Left Atrium: Left atrial size was normal in size. Right Atrium: Right atrial size was normal in size. Pericardium: There is no evidence of pericardial effusion. Mitral Valve: The mitral valve is normal in structure. Trivial mitral valve regurgitation. Tricuspid Valve: The tricuspid valve is normal in structure. Tricuspid valve regurgitation is mild to moderate. Aortic Valve: The aortic valve is tricuspid. There is mild calcification of the aortic valve. Aortic valve regurgitation is mild. Aortic regurgitation PHT measures 361 msec. Aortic valve sclerosis is present, with no evidence of aortic valve stenosis. Pulmonic Valve: The pulmonic valve was not well visualized. Pulmonic valve regurgitation is not visualized. Aorta: The aortic root is normal in size and structure. Venous: The inferior vena cava is normal in size with less than 50% respiratory variability, suggesting right atrial pressure of 8 mmHg. IAS/Shunts: No atrial level shunt detected by color flow Doppler.  LEFT VENTRICLE PLAX 2D LVIDd:         3.70 cm   Diastology LVIDs:         2.20 cm   LV e' medial:    4.46 cm/s LV PW:         1.20 cm   LV E/e' medial:  22.6 LV IVS:        1.00 cm   LV e' lateral:   4.68 cm/s LVOT diam:     1.70 cm   LV E/e' lateral: 21.6 LV SV:         56 LV SV Index:   34 LVOT Area:     2.27 cm  RIGHT VENTRICLE             IVC RV S prime:  15.10 cm/s  IVC diam: 1.90 cm TAPSE (M-mode): 2.0 cm LEFT ATRIUM             Index        RIGHT ATRIUM           Index LA diam:        3.30 cm 2.03 cm/m   RA Area:     11.10 cm LA Vol (A2C):   47.6 ml 29.32 ml/m  RA Volume:   21.90 ml  13.49 ml/m LA Vol (A4C):   46.8 ml 28.82 ml/m LA Biplane Vol: 48.7 ml 30.00 ml/m  AORTIC VALVE LVOT Vmax:   118.00 cm/s LVOT Vmean:  79.800 cm/s LVOT VTI:    0.246 m AI PHT:      361 msec  AORTA  Ao Root diam: 2.70 cm Ao Asc diam:  3.20 cm MITRAL VALVE                TRICUSPID VALVE MV Area (PHT): 3.17 cm     TR Peak grad:   50.4 mmHg MV Decel Time: 239 msec     TR Vmax:        355.00 cm/s MV E velocity: 101.00 cm/s MV A velocity: 116.00 cm/s  SHUNTS MV E/A ratio:  0.87         Systemic VTI:  0.25 m                             Systemic Diam: 1.70 cm Dani Gobble Croitoru MD Electronically signed by Sanda Klein MD Signature Date/Time: 09/20/2021/3:17:25 PM    Final        Hosie Poisson M.D. [facility] 09/22/2021, 2:14 PM  Available via Epic secure chat 7am-7pm After 7 pm, please refer to night coverage provider listed on amion.

## 2021-09-22 NOTE — TOC Progression Note (Signed)
Transition of Care [facility]) - Progression Note    Patient Details  Name: Linda [name] MRN: [id number] Date of Birth: Jul 01, 1924  [facility] CM/SW Contact  Baldemar Lenis, [place] Phone Number: 09/22/2021, 11:23 AM  Clinical Narrative:   CSW contacted [facility] to ask about bed availability and there is a bed available for patient tomorrow. CSW updated daughter, she would like to accept bed offer. CSW to follow.    Expected Discharge Plan: Skilled Nursing Facility Barriers to Discharge: Continued Medical Work up, SNF Pending bed offer  Expected Discharge Plan and Services Expected Discharge Plan: Skilled Nursing Facility     Post Acute Care Choice: Skilled Nursing Facility Living arrangements for the past 2 months: Independent Living Facility                                       Social Determinants of Health (SDOH) Interventions    Readmission Risk Interventions No flowsheet data found.

## 2021-09-22 NOTE — Progress Notes (Signed)
STROKE TEAM PROGRESS NOTE   INTERVAL HISTORY Her daughter is at the bedside.  Patient sitting in bed, complaining of something stuck in her throat on the right side of the neck.  However, she stated that she had breakfast this morning without problem at all.  Pending CTA head and neck.  MRI showed left posterior MCA patchy infarct.  Denies heart palpitation.  However, seen Dr. Harrell Gave in 06/2021 for paroxysmal atrial tachycardia diagnosed years ago.  Vitals:   09/22/21 0813 09/22/21 0905 09/22/21 1149 09/22/21 1601  BP:  (!) 182/74 (!) 158/66 (!) 164/71  Pulse: 73 63 (!) 58 62  Resp: 17 18 18 18   Temp:  97.8 F (36.6 C) 98.1 F (36.7 C) 98 F (36.7 C)  TempSrc:  Oral Oral   SpO2: 99% 99% 98% 97%  Weight:      Height:       CBC:  Recent Labs  Lab 09/21/21 0049 09/22/21 0051  WBC 7.7 7.7  NEUTROABS 4.9 4.7  HGB 11.7* 11.3*  HCT 34.2* 34.5*  MCV 95.3 96.6  PLT 214 [id number]   Basic Metabolic Panel:  Recent Labs  Lab 09/20/21 0338 09/21/21 0049 09/22/21 0051  NA 138 134* 134*  K 3.1* 3.2* 3.7  CL 107 103 103  CO2 21* 21* 22  GLUCOSE 104* 106* 103*  BUN 7* 8 9  CREATININE 0.52 0.59 0.61  CALCIUM 9.3 9.5 9.7  MG 1.8  --   --    Lipid Panel:  Recent Labs  Lab 09/20/21 0338  CHOL 116  TRIG 99  HDL 45  CHOLHDL 2.6  VLDL 20  LDLCALC 51   HgbA1c:  Recent Labs  Lab 09/20/21 0338  HGBA1C 5.8*   Urine Drug Screen:  No results for input(s): LABOPIA, COCAINSCRNUR, LABBENZ, AMPHETMU, THCU, LABBARB in the last 168 hours.   Alcohol Level  No results for input(s): ETH in the last 168 hours.   IMAGING past 24 hours CT ANGIO HEAD NECK W WO CM  Result Date: 09/22/2021 CLINICAL DATA:  Stroke.  Determine embolic source. EXAM: CT ANGIOGRAPHY HEAD AND NECK TECHNIQUE: Multidetector CT imaging of the head and neck was performed using the standard protocol during bolus administration of intravenous contrast. Multiplanar CT image reconstructions and MIPs were obtained to  evaluate the vascular anatomy. Carotid stenosis measurements (when applicable) are obtained utilizing NASCET criteria, using the distal internal carotid diameter as the denominator. RADIATION DOSE REDUCTION: This exam was performed according to the departmental dose-optimization program which includes automated exposure control, adjustment of the mA and/or kV according to patient size and/or use of iterative reconstruction technique. CONTRAST:  53mL OMNIPAQUE IOHEXOL 350 MG/ML SOLN COMPARISON:  CT head 09/19/2021.  MRI head 09/21/2021 FINDINGS: CT HEAD FINDINGS Brain: Limited image quality. The patient had considerable pain and was not able to hold still. Patient has thoracic kyphosis and could not lay flat. There is extensive streak artifact in the brain. Ventricle size normal. No acute hemorrhage or mass. Restricted diffusion in the left parietal lobe is not identified by CT. Review of the MIP images confirms the above findings CTA NECK FINDINGS Aortic arch: Standard branching. Imaged portion shows no evidence of aneurysm or dissection. No significant stenosis of the major arch vessel origins. Mild atherosclerotic disease aortic arch and proximal great vessels Right carotid system: Atherosclerotic calcification right carotid bifurcation. 25% diameter stenosis proximal right internal carotid artery Left carotid system: Mild atherosclerotic disease left carotid bifurcation without stenosis Vertebral arteries: Both vertebral arteries patent  to the basilar without stenosis Skeleton: Cervical spondylosis. No acute skeletal abnormality. Cervicothoracic kyphosis. Other neck: Enlarged thyroid bilaterally left greater than right. Substernal goiter extends into the superior mediastinum on the left splay sing the trachea to the right. Upper chest: Mild scarring in the apices.  No acute abnormality. Suboptimal image quality due to difficulty positioning the patient who was kyphotic and not able to lie flat. Review of the MIP  images confirms the above findings CTA HEAD FINDINGS Anterior circulation: Internal carotid artery patent through the skull base and cavernous segment with mild atherosclerotic disease. Anterior and middle cerebral arteries patent bilaterally without stenosis or occlusion. No aneurysm. Posterior circulation: Both vertebral arteries are patent to the basilar. PICA not visualized. AICA patent bilaterally. Basilar patent bilaterally. Posterior cerebral arteries patent bilaterally. Fetal origin right posterior cerebral artery. Venous sinuses: Normal venous enhancement Anatomic variants: None Image quality is suboptimal due to streak artifact from patient positioning Review of the MIP images confirms the above findings IMPRESSION: 1. Limited study. The patient is kyphotic and not able to lie flat. Patient not able to hold still. There is considerable artifact on the study with decreased spatial resolution. 2. Restricted diffusion left parietal lobe seen on MRI not visualized by CT. No acute hemorrhage. 3. Negative for intracranial large vessel occlusion or flow limiting stenosis 4. 25% diameter stenosis proximal right internal carotid artery. Left carotid widely patent 5. Both vertebral arteries widely patent. 6. Goiter with substernal extension on the left. Electronically Signed   By: Marlan Palau M.D.   On: 09/22/2021 15:17    PHYSICAL EXAM  Physical Exam  Constitutional: Appears frail, elderly.  Psych: depressed mood Cardiovascular: Normal rate and regular rhythm.  Respiratory: Effort normal, non-labored breathing  Neuro - patient in depressed mood, awake alert, orientated to month, people and age, however not orientated to year or place, said "2022" and "rehab".  No aphasia, paucity of speech, follows most simple commands, able to name and repeat.  No gaze palsy, visual field full, however complaining of right visual field decreased visual acuity.  facial symmetrical.  Tongue midline.  Bilateral upper  extremity at least 4/5, symmetrical, right lower extremity 4/5, symmetrical  Sensation intact, finger-to-nose grossly intact bilaterally.  ASSESSMENT/PLAN [name] is a 88 y.o. female with history of hypertension, hyperlipidemia, COPD, glaucoma presenting with intermittent/recurrent confusion, difficulty finding words, poor appetite, generalized weakness, and a sensation of not feeling good that has been going on since 09/13/2021.   Stroke: left posterior MCA infarct, embolic pattern, concerning for occult A-fib Code Stroke no acute intracranial abnormalities, atrophy with small vessel chronic ischemic changes  CTA head & neck 09/22/2021 showed 25% diameter stenosis proximal right internal carotid artery MRI (09/14/2021) no acute intracranial processes/infarcts. Foci of hemosiderin deposition in the left-greater-than-right Repeat MRI 09/21/21- Patchy acute infarction in the low left parietal lobe 2D Echo EF 70 to 75% LDL 51 HgbA1c 5.8 VTE prophylaxis -Lovenox No antithrombotic prior to admission, now on aspirin 81 mg daily and Plavix 75 DAPT for 3 weeks and then as alone. Therapy recommendations: Pending, No SLP follow up Disposition: Pending  Paroxysmal atrial tachycardia Per daughter, this was diagnosed years ago with heart monitoring She saw Dr. Aggie Cosier for in 06/2021, continued on sotalol Given current stroke, concerning for occult A-fib Recommend long-term heart monitoring to rule out A-fib.  Will discuss with EP  Hypertension Home meds: Sotalol, amlodipine Stable Long-term BP goal normotensive As needed hydralazine  Hyperlipidemia Home meds: Rosuvastatin 20 mg, resumed  in hospital LDL 51, goal < 70 Continue statin on discharge  Other Stroke Risk Factors Advanced Age >/= 28  Former cigarette smoker Coronary artery disease Migraines Congestive heart failure  Other Active Problems Depression/anxiety COPD with cough and phlegm, improved Pulmonary hygiene Cough, turn,  deep breath, up out of bed as tolerated Dounebs and mucinex per primary team [facility] day # 3   Rosalin Hawking, MD PhD Stroke Neurology 09/22/2021 6:09 PM    To contact Stroke Continuity provider, please refer to [email or web address] After hours, contact General Neurology

## 2021-09-22 NOTE — Progress Notes (Signed)
Physical Therapy Treatment Patient Details Name: Linda [name] MRN: [id number] DOB: 1924/08/05 Today's Date: 09/22/2021   History of Present Illness 88 yo female with onset of low intake, weakness, FTT, diarrhea and confusion was admitted from [facility] on 2/6.  Pt is cleared for a stroke change, but had low Na+, uncontrolled HTN and possible encephalopathy. 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  PMHx:  COPD, HTN, atherosclerosis, glaucoma, osteoporosis, thyroid disease    PT Comments    Pt is making good progress towards her goals. Met pt ambulating with OT in hallway. Pt able to ambulate with min guard in hall way but needs min A and maximal verbal cuing for navigating Rollator around obstacles in room to sit up in chair. Once in chair, started seated exercis, when transport arrived to take pt to CT. Pt limited by poor safety awareness and decreased awareness of her deficits. D/c plans remain appropriate.    Recommendations for follow up therapy are one component of a multi-disciplinary discharge planning process, led by the attending physician.  Recommendations may be updated based on patient status, additional functional criteria and insurance authorization.  Follow Up Recommendations  Skilled nursing-short term rehab (<3 hours/day)     Assistance Recommended at Discharge Frequent or constant Supervision/Assistance  Patient can return home with the following A little help with walking and/or transfers;A little help with bathing/dressing/bathroom;Assistance with cooking/housework;Assist for transportation   Equipment Recommendations  Rollator (4 wheels)    Recommendations for Other Services       Precautions / Restrictions Precautions Precautions: Fall Precaution Comments: HOH, wearing hearing devices Restrictions Weight Bearing Restrictions: No     Mobility  Bed  Mobility Overal bed mobility: Needs Assistance Bed Mobility: Sit to Supine       Sit to supine: Min guard   General bed mobility comments: HOB elevated (assist with managing gown from underneath her otherwise no assist needed)    Transfers Overall transfer level: Needs assistance Equipment used: Rollator (4 wheels) Transfers: Sit to/from Stand Sit to Stand: Min guard           General transfer comment: min guard to stand safely, minor impulsivity    Ambulation/Gait Ambulation/Gait assistance: Min guard, Min assist Gait Distance (Feet): 180 Feet Assistive device: Rollator (4 wheels) Gait Pattern/deviations: Step-through pattern, Decreased stride length, Trunk flexed Gait velocity: reduced Gait velocity interpretation: <1.8 ft/sec, indicate of risk for recurrent falls   General Gait Details: min guard for safety, slow, steady gait in straight away requires increased multimodal cuing and physical assistance for managing RW in tight spaces         Balance Overall balance assessment: Needs assistance Sitting-balance support: Feet supported Sitting balance-Leahy Scale: Fair     Standing balance support: Bilateral upper extremity supported, During functional activity Standing balance-Leahy Scale: Poor                              Cognition Arousal/Alertness: Awake/alert Behavior During Therapy: WFL for tasks assessed/performed Overall Cognitive Status: Impaired/Different from baseline Area of Impairment: Memory, Attention, Orientation                 Orientation Level: Situation Current Attention Level: Selective Memory: Decreased short-term memory         General Comments: slightly perseverative on not feeling well, and having some difficulty with swallow  Exercises General Exercises - Lower Extremity Ankle Circles/Pumps: AROM, Both, 10 reps, Seated    General Comments General comments (skin integrity, edema, etc.): VSS on RA       Pertinent Vitals/Pain Pain Assessment Pain Assessment: No/denies pain     PT Goals (current goals can now be found in the care plan section) Acute Rehab PT Goals Patient Stated Goal: to be able to concentrate and walk again like usual PT Goal Formulation: With patient/family Time For Goal Achievement: 10/04/21 Potential to Achieve Goals: Good Progress towards PT goals: Progressing toward goals    Frequency    Min 3X/week      PT Plan Current plan remains appropriate       AM-PAC PT "6 Clicks" Mobility   Outcome Measure  Help needed turning from your back to your side while in a flat bed without using bedrails?: A Little Help needed moving from lying on your back to sitting on the side of a flat bed without using bedrails?: A Little Help needed moving to and from a bed to a chair (including a wheelchair)?: A Little Help needed standing up from a chair using your arms (e.g., wheelchair or bedside chair)?: A Little Help needed to walk in hospital room?: A Little Help needed climbing 3-5 steps with a railing? : Total 6 Click Score: 16    End of Session   Activity Tolerance: Patient limited by fatigue;Treatment limited secondary to medical complications (Comment) Patient left: in bed;with call bell/phone within reach;with family/visitor present Nurse Communication: Mobility status PT Visit Diagnosis: Unsteadiness on feet (R26.81);Muscle weakness (generalized) (M62.81);Other abnormalities of gait and mobility (R26.89)     Time: 8063-8685 PT Time Calculation (min) (ACUTE ONLY): 9 min  Charges:  $Therapeutic Exercise: 8-22 mins                     Elroy Schembri B. Migdalia Dk PT, DPT Acute Rehabilitation Services Pager [phone] Office [phone]    [place] 09/22/2021, 4:20 PM

## 2021-09-22 NOTE — Assessment & Plan Note (Signed)
Continue with azithromycin for bronchiectasis.

## 2021-09-22 NOTE — Assessment & Plan Note (Signed)
Stable

## 2021-09-22 NOTE — Progress Notes (Signed)
Occupational Therapy Treatment Patient Details Name: Linda [name] MRN: [id number] DOB: 09-15-1923 Today's Date: 09/22/2021   History of present illness 88 yo female with onset of low intake, weakness, FTT, diarrhea and confusion was admitted from [facility] on 2/6.  Pt is cleared for a stroke change, but had low Na+, uncontrolled HTN and possible encephalopathy. 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  PMHx:  COPD, HTN, atherosclerosis, glaucoma, osteoporosis, thyroid disease   OT comments  Pt making good progress with OT goals. This session, pt presenting with difficulties following multistep commands. Pt provided 4 step pathfinding task, pt requiring redirection and reminding throughout. OT will continue to follow acutely.    Recommendations for follow up therapy are one component of a multi-disciplinary discharge planning process, led by the attending physician.  Recommendations may be updated based on patient status, additional functional criteria and insurance authorization.    Follow Up Recommendations  Skilled nursing-short term rehab (<3 hours/day)    Assistance Recommended at Discharge Frequent or constant Supervision/Assistance  Patient can return home with the following  Assist for transportation;Assistance with cooking/housework;Assistance with feeding;Direct supervision/assist for medications management   Equipment Recommendations  None recommended by OT    Recommendations for Other Services      Precautions / Restrictions Precautions Precautions: Fall Precaution Comments: HOH, wearing hearing devices Restrictions Weight Bearing Restrictions: No       Mobility Bed Mobility Overal bed mobility: Needs Assistance Bed Mobility: Supine to Sit     Supine to sit: Min guard     General bed mobility comments: HOB elevated    Transfers Overall transfer level: Needs  assistance Equipment used: Rollator (4 wheels) Transfers: Sit to/from Stand Sit to Stand: Min guard           General transfer comment: min guard to stand safely, minor impulsivity     Balance Overall balance assessment: Needs assistance Sitting-balance support: Feet supported Sitting balance-Leahy Scale: Fair     Standing balance support: Bilateral upper extremity supported, During functional activity Standing balance-Leahy Scale: Poor                             ADL either performed or assessed with clinical judgement   ADL Overall ADL's : Needs assistance/impaired     Grooming: Wash/dry hands;Wash/dry face;Oral care;Supervision/safety;Standing Grooming Details (indicate cue type and reason): completed at sink                               General ADL Comments: Session focused on pt following multiple step commands    Extremity/Trunk Assessment              Vision       Perception     Praxis      Cognition Arousal/Alertness: Awake/alert Behavior During Therapy: WFL for tasks assessed/performed Overall Cognitive Status: Impaired/Different from baseline Area of Impairment: Memory, Attention, Orientation                 Orientation Level: Situation Current Attention Level: Selective Memory: Decreased short-term memory         General Comments: Pt having difficulty with higher cognitive tasks, including multi step commands/tasks.        Exercises      Shoulder Instructions       General  Comments VSS on RA    Pertinent Vitals/ Pain       Pain Assessment Pain Assessment: No/denies pain  Home Living                                          Prior Functioning/Environment              Frequency  Min 2X/week        Progress Toward Goals  OT Goals(current goals can now be found in the care plan section)  Progress towards OT goals: Progressing toward goals  Acute Rehab OT  Goals Patient Stated Goal: To get better OT Goal Formulation: With patient Time For Goal Achievement: 10/05/21 Potential to Achieve Goals: Good ADL Goals Pt Will Perform Grooming: with modified independence;standing Pt Will Perform Tub/Shower Transfer: with modified independence;ambulating;Shower transfer Pt/caregiver will Perform Home Exercise Program: Increased ROM;Increased strength;Both right and left upper extremity;With written HEP provided;With Supervision Additional ADL Goal #1: Pt will indep complete cognitive medication management IADL task  Plan Discharge plan remains appropriate;Frequency remains appropriate    Co-evaluation                 AM-PAC OT "6 Clicks" Daily Activity     Outcome Measure   Help from another person eating meals?: None Help from another person taking care of personal grooming?: A Little Help from another person toileting, which includes using toliet, bedpan, or urinal?: A Little Help from another person bathing (including washing, rinsing, drying)?: A Little Help from another person to put on and taking off regular upper body clothing?: None Help from another person to put on and taking off regular lower body clothing?: A Little 6 Click Score: 20    End of Session Equipment Utilized During Treatment: Rollator (4 wheels)  OT Visit Diagnosis: Unsteadiness on feet (R26.81);Other abnormalities of gait and mobility (R26.89);Muscle weakness (generalized) (M62.81);Adult, failure to thrive (R62.7)   Activity Tolerance Patient tolerated treatment well   Patient Left Other (comment) (In hallway with PT)   Nurse Communication Mobility status        Time: [id number] OT Time Calculation (min): 14 min  Charges: OT General Charges $OT Visit: 1 Visit OT Treatments $Therapeutic Activity: 8-22 mins  Michelle Vanhise H., OTR/L Acute Rehabilitation  Isaid Salvia Elane Macy Polio 09/22/2021, 7:20 PM

## 2021-09-23 ENCOUNTER — Other Ambulatory Visit (HOSPITAL_COMMUNITY): Payer: Medicare Other

## 2021-09-23 MED ORDER — ASPIRIN 81 MG PO TBEC: 81.0000 mg | DELAYED_RELEASE_TABLET | Freq: Every day | ORAL | 11 refills | Status: DC

## 2021-09-23 MED ORDER — HYDROCORTISONE (PERIANAL) 2.5 % EX CREA: TOPICAL_CREAM | Freq: Three times a day (TID) | CUTANEOUS | 0 refills | Status: DC

## 2021-09-23 MED ORDER — DM-GUAIFENESIN ER 30-600 MG PO TB12: 1.0000 | ORAL_TABLET | Freq: Two times a day (BID) | ORAL | Status: DC

## 2021-09-23 MED ORDER — CLOPIDOGREL BISULFATE 75 MG PO TABS: 75.0000 mg | ORAL_TABLET | Freq: Every day | ORAL | 1 refills | Status: DC

## 2021-09-23 NOTE — Plan of Care (Signed)
°  Problem: Safety: Goal: Ability to remain free from injury will improve Outcome: Progressing   Problem: Skin Integrity: Goal: Risk for impaired skin integrity will decrease Outcome: Progressing   Problem: Coping: Goal: Will identify appropriate support needs Outcome: Progressing   Problem: Self-Care: Goal: Ability to participate in self-care as condition permits will improve Outcome: Progressing Goal: Verbalization of feelings and concerns over difficulty with self-care will improve Outcome: Progressing   Problem: Ischemic Stroke/TIA Tissue Perfusion: Goal: Complications of ischemic stroke/TIA will be minimized Outcome: Progressing

## 2021-09-23 NOTE — Discharge Summary (Addendum)
Physician Discharge Summary   Patient: Linda [name] MRN: [id number] DOB: [birth year]  Admit date:     09/19/2021  Discharge date: 09/23/21  Discharge Physician: Hosie Poisson   PCP: Vivi Barrack, MD   Recommendations at discharge:  Please follow upw ith Neurology as scheduled Please follow up with EP as recommended.  Please follow up with cardiology as previously scheduled.  Please follow up with PCP in 1 to 2 weeks.   Discharge Diagnoses: Principal Problem:   Altered mental status Active Problems:   Hyperlipidemia   Gastroesophageal reflux disease without esophagitis   Essential hypertension   Bronchiectasis without acute exacerbation (HCC)   Hypokalemia   Chronic diastolic CHF (congestive heart failure) (HCC)   COPD (chronic obstructive pulmonary disease) (HCC)   Cerebral thrombosis with cerebral infarction     Hospital Course: Linda [name] is a 88 y.o. female with medical history significant of HTN, HLD, COPD, glaucoma presents to the ED with multiple complaints including intermittent/recurrent confusion, difficulty finding words, poor appetite, generalized weakness and a sensation of not feeling good that has been ongoing for the past 1 week.  Of note, patient first presented to the ED on 09/13/2021, at that time SBP noted to be in the 200s, with similar complaints, MRI and CT scans done at that time were negative for acute stroke. Discussions about admission for possible hypertensive encephalopathy was discussed with patient and the neurology consultation, but patient left AMA.  Patient returned to the ED on 09/14/2021 again with similar complaints, recommended for admission for further eval, pt declined and signed AMA again. On 09/15/21 pt followed up with PCP and basic lab work was done, reported resolved diarrhea. On 09/19/21, pt went to PCP again still with similar complaints and was advised to go to the ED. According to her daughter, patient was able to perform her ADLs in an  independent living facility, ambulates with a rollator and was at her baseline up until 09/13/2021. Reports some productive cough of whitish sputum which has been ongoing for the past couple of days. In the ED, vital signs fairly stable except for some SBP's in the 190s, labs unremarkable except for sodium of 130, chloride 97, UA showed small hemoglobin, trace leukocytes, SG less than 1.005, rare bacteria.  CT head with no acute intracranial abnormalities.  EDP spoke to neurology, recommended admission and they will consult.  Patient admitted for further management  Assessment and Plan: * Altered mental status- (present on admission)/ Patchy acute infarction in the low left parietal lobe: Secondary to left parietal infarct in the setting of  mild cognitive impairment CT head and MRI brain (09/14/21) unremarkable for any acute intracranial abnormality Repeat MRI brain showed Patchy acute infarction in the low left parietal lobe Echo showed EF of 70 to [id number], grade 2 diastolic dysfunction, with moderately elevated pulmonary artery systolic pressure [id number] mmHG Rule out any infectious causes, UA with trace leukocytes, rare bacteria, urine culture on 09/15/2021 no growth Chest x-ray X 2, no active disease Vit B12 369, RPR non-reactive Neurology consulted, appreciate recs, CTA head and neck has a lot of artifact.   A1c, 5.8, LDL 51 Telemetry, frequent neurochecks PT/OT/SLP- SNF. Cryptogenic stroke, EP called for loop recorder placement and the patient refused. Best to address it again at cardiology appt scheduled with Dr Harrell Gave in the coming weeks.  Meanwhile continue with aspirin and plavix.     COPD (chronic obstructive pulmonary disease) ([place]) Continue with bronchodilators.  CXR is negative for pneumonia.  D/c  azithromycin after today's dose.  She reports breathing has improved.  No wheezing on exam today.   Chronic diastolic CHF (congestive heart failure) ([place])- (present on admission) Last  echocardiogram shows Left ventricular ejection fraction, 70 to 75% with hyperdynamic function. The left ventricle has no  regional wall motion abnormalities. Left ventricular diastolic parameters  are consistent with Grade II diastolic  dysfunction (pseudonormalization). There is moderately elevated pulmonary artery systolic  pressure. The estimated right ventricular systolic pressure is [id number] mmHg. Follow up with cardiology as scheduled.   she remains on RA,.   Hypokalemia Replaced. Recheck wnl.   Bronchiectasis without acute exacerbation ([place]) Continue with azithromycin for bronchiectasis.   Essential hypertension- (present on admission) Well controlled. No changes in medications.  Gastroesophageal reflux disease without esophagitis- (present on admission) Stable.  Hyperlipidemia- (present on admission) Resume crestor         Consultants: neurology Procedures performed: none.   Disposition: Skilled nursing facility Diet recommendation:  Regular diet  DISCHARGE MEDICATION: Allergies as of 09/23/2021   No Known Allergies      Medication List     STOP taking these medications    amLODipine 5 MG tablet Commonly known as: NORVASC   Fluad 0.5 ML Susy Generic drug: Influenza Vac A&B Surf Ant Adj   fluticasone 50 MCG/ACT nasal spray Commonly known as: FLONASE       TAKE these medications    acetaminophen 500 MG tablet Commonly known as: TYLENOL Take 1,000 mg by mouth every 6 (six) hours as needed.   aspirin 81 MG EC tablet Take 1 tablet (81 mg total) by mouth daily. Swallow whole. Start taking on: September 24, 2021   clobetasol cream 0.05 % Commonly known as: TEMOVATE Apply 1 application topically as needed.   clopidogrel 75 MG tablet Commonly known as: PLAVIX Take 1 tablet (75 mg total) by mouth daily. Start taking on: September 24, 2021   dextromethorphan-guaiFENesin 30-600 MG 12hr tablet Commonly known as: MUCINEX DM Take 1 tablet by mouth 2 (two)  times daily.   famotidine 20 MG tablet Commonly known as: PEPCID Take 1 tablet (20 mg total) by mouth 2 (two) times daily.   hydrocortisone 2.5 % rectal cream Commonly known as: ANUSOL-HC Place rectally 3 (three) times daily.   rosuvastatin 20 MG tablet Commonly known as: CRESTOR Take 1 tablet (20 mg total) by mouth daily.   sotalol 80 MG tablet Commonly known as: BETAPACE Take 1 tablet (80 mg total) by mouth every 12 (twelve) hours.   timolol 0.25 % ophthalmic solution Commonly known as: TIMOPTIC Place 1 drop into both eyes daily.   Vitamin D3 25 MCG (1000 UT) Caps Take 1 capsule by mouth 2 (two) times daily.        Contact information for follow-up providers     Vivi Barrack, MD. Schedule an appointment as soon as possible for a visit in 1 week(s).   Specialty: Family Medicine Contact information: [place] [address] [phone]         Buford Dresser, MD .   Specialty: Cardiology Contact information: [address] [place] [address] [phone]              Contact information for after-discharge care     Destination     [facility] .   Service: Skilled Nursing Contact information: [address] [place] [phone]  Discharge Exam: Filed Weights   09/19/21 1128  Weight: 57 kg   Today's Vitals   09/22/21 2200 09/22/21 2305 09/23/21 0300 09/23/21 0943  BP:  (!) 135/50  (!) 145/55  Pulse:  70  73  Resp:  16  18  Temp:  98.4 F (36.9 C)  97.6 F (36.4 C)  TempSrc:  Oral  Oral  SpO2:  95%  97%  Weight:      Height:      PainSc: 0-No pain 0-No pain 0-No pain    Body mass index is 20.91 kg/m.   Condition at discharge: fair  The results of significant diagnostics from this hospitalization (including imaging, microbiology, ancillary and laboratory) are listed below for reference.   Imaging Studies: CT ANGIO  HEAD NECK W WO CM  Result Date: 09/22/2021 CLINICAL DATA:  Stroke.  Determine embolic source. EXAM: CT ANGIOGRAPHY HEAD AND NECK TECHNIQUE: Multidetector CT imaging of the head and neck was performed using the standard protocol during bolus administration of intravenous contrast. Multiplanar CT image reconstructions and MIPs were obtained to evaluate the vascular anatomy. Carotid stenosis measurements (when applicable) are obtained utilizing NASCET criteria, using the distal internal carotid diameter as the denominator. RADIATION DOSE REDUCTION: This exam was performed according to the departmental dose-optimization program which includes automated exposure control, adjustment of the mA and/or kV according to patient size and/or use of iterative reconstruction technique. CONTRAST:  23mL OMNIPAQUE IOHEXOL 350 MG/ML SOLN COMPARISON:  CT head 09/19/2021.  MRI head 09/21/2021 FINDINGS: CT HEAD FINDINGS Brain: Limited image quality. The patient had considerable pain and was not able to hold still. Patient has thoracic kyphosis and could not lay flat. There is extensive streak artifact in the brain. Ventricle size normal. No acute hemorrhage or mass. Restricted diffusion in the left parietal lobe is not identified by CT. Review of the MIP images confirms the above findings CTA NECK FINDINGS Aortic arch: Standard branching. Imaged portion shows no evidence of aneurysm or dissection. No significant stenosis of the major arch vessel origins. Mild atherosclerotic disease aortic arch and proximal great vessels Right carotid system: Atherosclerotic calcification right carotid bifurcation. 25% diameter stenosis proximal right internal carotid artery Left carotid system: Mild atherosclerotic disease left carotid bifurcation without stenosis Vertebral arteries: Both vertebral arteries patent to the basilar without stenosis Skeleton: Cervical spondylosis. No acute skeletal abnormality. Cervicothoracic kyphosis. Other neck:  Enlarged thyroid bilaterally left greater than right. Substernal goiter extends into the superior mediastinum on the left splay sing the trachea to the right. Upper chest: Mild scarring in the apices.  No acute abnormality. Suboptimal image quality due to difficulty positioning the patient who was kyphotic and not able to lie flat. Review of the MIP images confirms the above findings CTA HEAD FINDINGS Anterior circulation: Internal carotid artery patent through the skull base and cavernous segment with mild atherosclerotic disease. Anterior and middle cerebral arteries patent bilaterally without stenosis or occlusion. No aneurysm. Posterior circulation: Both vertebral arteries are patent to the basilar. PICA not visualized. AICA patent bilaterally. Basilar patent bilaterally. Posterior cerebral arteries patent bilaterally. Fetal origin right posterior cerebral artery. Venous sinuses: Normal venous enhancement Anatomic variants: None Image quality is suboptimal due to streak artifact from patient positioning Review of the MIP images confirms the above findings IMPRESSION: 1. Limited study. The patient is kyphotic and not able to lie flat. Patient not able to hold still. There is considerable artifact on the study with decreased spatial resolution. 2. Restricted diffusion left parietal lobe seen  on MRI not visualized by CT. No acute hemorrhage. 3. Negative for intracranial large vessel occlusion or flow limiting stenosis 4. 25% diameter stenosis proximal right internal carotid artery. Left carotid widely patent 5. Both vertebral arteries widely patent. 6. Goiter with substernal extension on the left. Electronically Signed   By: Franchot Gallo M.D.   On: 09/22/2021 15:17   DG Chest 2 View  Result Date: 09/14/2021 CLINICAL DATA:  Altered mental status EXAM: CHEST - 2 VIEW COMPARISON:  Previous studies including the chest radiograph done on 02/03/2019 FINDINGS: Cardiac size is within normal limits. There are no signs  of pulmonary edema or focal pulmonary consolidation. Increase in AP diameter of chest suggests COPD. Linear densities in the anterior mid lung fields seen in the lateral view may suggest minimal scarring or subsegmental atelectasis. In the AP view, there are few small linear densities immediately above the minor fissure in the right mid lung fields. There is prominence of soft tissues in the paratracheal region in the lower neck, possibly suggesting enlarged thyroid, more so on the left side. IMPRESSION: There are no new infiltrates or signs of pulmonary edema. COPD. Linear densities in the anterior mid lung fields seen in the lateral view may suggest scarring or subsegmental atelectasis. Electronically Signed   By: Elmer Picker M.D.   On: 09/14/2021 16:29   CT Head Wo Contrast  Result Date: 09/19/2021 CLINICAL DATA:  Mental status changes, ongoing weakness and failure to thrive, loss of appetite, diarrhea, history COPD, hypertension EXAM: CT HEAD WITHOUT CONTRAST TECHNIQUE: Contiguous axial images were obtained from the base of the skull through the vertex without intravenous contrast. RADIATION DOSE REDUCTION: This exam was performed according to the departmental dose-optimization program which includes automated exposure control, adjustment of the mA and/or kV according to patient size and/or use of iterative reconstruction technique. COMPARISON:  09/13/2021 FINDINGS: Brain: Generalized atrophy, greater posteriorly. Chronic LEFT cerebellar atrophy and prominence of CSF at LEFT posterior fossa. No midline shift or mass effect. Small vessel chronic ischemic changes of deep cerebral white matter. No intracranial hemorrhage, mass lesion or evidence of acute infarction. No extra-axial collection. Scattered nonspecific dural calcification. Vascular: Atherosclerotic calcifications of internal carotid and vertebral arteries at skull base Skull: Intact Sinuses/Orbits: Chronic opacification of LEFT sphenoid  sinus. Nasal septal deviation to the LEFT. Other: N/A IMPRESSION: Atrophy with small vessel chronic ischemic changes of deep cerebral white matter. No acute intracranial abnormalities. Electronically Signed   By: Lavonia Dana M.D.   On: 09/19/2021 13:18   MR BRAIN WO CONTRAST  Result Date: 09/21/2021 CLINICAL DATA:  Stroke follow-up EXAM: MRI HEAD WITHOUT CONTRAST TECHNIQUE: Multiplanar, multiecho pulse sequences of the brain and surrounding structures were obtained without intravenous contrast. COMPARISON:  Head CT from 2 days ago.  Brain MRI 09/14/2021 FINDINGS: Brain: Patchy acute infarction in the left parietal lobe, primarily white matter with extension from subcortical white matter to the lateral ventricle. Branching hypointensity on gradient and T2 weighted imaging with nodular areas of decreased gradient signal in the left cerebellum, a large developmental venous anomaly with chronic congestive/microhemorrhages versus associated cavernomas/capillary telangiectasias. DVA appearance was confirmed on a 05/26/2020 CT with contrast. Remote insult with hemosiderin staining in the deep right cerebellum. Mild for age chronic small vessel ischemia and cerebral volume loss. Chronic CSF accumulation posterior to the left more than right cerebellum. No hydrocephalus or collection. Vascular: Left cerebellar findings as above. Skull and upper cervical spine: Normal marrow signal Sinuses/Orbits: Bilateral cataract resection. Chronic left sphenoid  sinusitis with complete opacification by inspissated material. IMPRESSION: 1. Patchy acute infarction in the low left parietal lobe. 2. 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 Electronically Signed   By: Jorje Guild M.D.   On: 09/21/2021 19:03   MR BRAIN WO CONTRAST  Result Date: 09/14/2021 CLINICAL DATA:  Stroke suspected, confusion, difficulty walking EXAM: MRI HEAD WITHOUT CONTRAST TECHNIQUE: Multiplanar, multiecho pulse sequences of the brain and  surrounding structures were obtained without intravenous contrast. COMPARISON:  No prior MRI, correlation made with CT head 09/13/2021 FINDINGS: Brain: No restricted diffusion to suggest acute or subacute infarct. No acute hemorrhage, mass, mass effect, or midline shift. No hydrocephalus or extra-axial collection. Foci of hemosiderin deposition in the left cerebellum (series 14, image 16), adjacent to a developmental venous anomaly, which may represent cavernous malformations without edema to suggest recent hemorrhage. Minimal T2 hyperintense signal in the periventricular white matter, likely the sequela of mild chronic small vessel ischemic disease. Vascular: Normal arterial flow voids. Skull and upper cervical spine: Normal marrow signal. Sinuses/Orbits: Filling of the left sphenoid sinus, likely chronic. Status post bilateral lens replacements. Other: The mastoids are well aerated. IMPRESSION: 1. No acute infarct or other acute intracranial process. 2. Foci of hemosiderin deposition in the left-greater-than-right which may represent hypertensive microhemorrhages or multiple small cavernomas, with an adjacent developmental venous anomaly. No significant edema in this area to suggest recent hemorrhage. Electronically Signed   By: Merilyn Baba M.D.   On: 09/14/2021 03:42   DG Chest Port 1 View  Result Date: 09/20/2021 CLINICAL DATA:  Dyspnea.  History COPD EXAM: PORTABLE CHEST 1 VIEW COMPARISON:  09/19/2021 FINDINGS: Heart size and vascularity within normal limits. Atherosclerotic calcification thoracic aorta. Lungs are clear without infiltrate or effusion. IMPRESSION: No active disease. Electronically Signed   By: Franchot Gallo M.D.   On: 09/20/2021 13:12   DG Chest Port 1 View  Result Date: 09/19/2021 CLINICAL DATA:  Weakness, failure to thrive EXAM: PORTABLE CHEST 1 VIEW COMPARISON:  09/14/2021 FINDINGS: Prominent superior mediastinum seen on prior CT to reflect substernal thyroid goiter. Heart is  borderline in size. Aortic atherosclerosis. There is hyperinflation of the lungs compatible with COPD. Biapical scarring. No acute confluent opacities or effusions. No acute bony abnormality. IMPRESSION: COPD.  Chronic changes. No active disease. Electronically Signed   By: Rolm Baptise M.D.   On: 09/19/2021 19:22   ECHOCARDIOGRAM COMPLETE  Result Date: 09/20/2021    ECHOCARDIOGRAM REPORT   Patient Name:   YICHEN Brocker Date of Exam: 09/20/2021 Medical Rec #:  [id number]     Height:       65.0 in Accession #:    [id number]    Weight:       125.7 lb Date of Birth:  [birth year]     BSA:          1.624 m Patient Age:    88 years      BP:           127/68 mmHg Patient Gender: F             HR:           72 bpm. Exam Location:  Inpatient Procedure: 2D Echo Indications:    TIA  History:        Patient has prior history of Echocardiogram examinations, most                 recent 09/30/2019. Signs/Symptoms:Altered Mental Status; Risk  Factors:Hypertension.  Sonographer:    Johny Chess RDCS Referring Phys: [id number] [place]  1. Left ventricular ejection fraction, by estimation, is 70 to 75%. The left ventricle has hyperdynamic function. The left ventricle has no regional wall motion abnormalities. Left ventricular diastolic parameters are consistent with Grade II diastolic dysfunction (pseudonormalization). Elevated left atrial pressure.  2. Right ventricular systolic function is normal. The right ventricular size is normal. There is moderately elevated pulmonary artery systolic pressure. The estimated right ventricular systolic pressure is [id number] mmHg.  3. The mitral valve is normal in structure. Trivial mitral valve regurgitation.  4. Tricuspid valve regurgitation is mild to moderate.  5. The aortic valve is tricuspid. There is mild calcification of the aortic valve. Aortic valve regurgitation is mild. Aortic valve sclerosis is present, with no evidence of aortic valve stenosis.  6.  The inferior vena cava is normal in size with <50% respiratory variability, suggesting right atrial pressure of 8 mmHg. Comparison(s): Prior images reviewed side by side. The left ventricular diastolic function is significantly worse. There is now evidence of increased mean left atrial pressure, otherwise no significant change. FINDINGS  Left Ventricle: Left ventricular ejection fraction, by estimation, is 70 to 75%. The left ventricle has hyperdynamic function. The left ventricle has no regional wall motion abnormalities. The left ventricular internal cavity size was normal in size. There is borderline concentric left ventricular hypertrophy. Left ventricular diastolic parameters are consistent with Grade II diastolic dysfunction (pseudonormalization). Elevated left atrial pressure. Right Ventricle: The right ventricular size is normal. No increase in right ventricular wall thickness. Right ventricular systolic function is normal. There is moderately elevated pulmonary artery systolic pressure. The tricuspid regurgitant velocity is 3.55 m/s, and with an assumed right atrial pressure of 8 mmHg, the estimated right ventricular systolic pressure is [id number] mmHg. Left Atrium: Left atrial size was normal in size. Right Atrium: Right atrial size was normal in size. Pericardium: There is no evidence of pericardial effusion. Mitral Valve: The mitral valve is normal in structure. Trivial mitral valve regurgitation. Tricuspid Valve: The tricuspid valve is normal in structure. Tricuspid valve regurgitation is mild to moderate. Aortic Valve: The aortic valve is tricuspid. There is mild calcification of the aortic valve. Aortic valve regurgitation is mild. Aortic regurgitation PHT measures 361 msec. Aortic valve sclerosis is present, with no evidence of aortic valve stenosis. Pulmonic Valve: The pulmonic valve was not well visualized. Pulmonic valve regurgitation is not visualized. Aorta: The aortic root is normal in size and  structure. Venous: The inferior vena cava is normal in size with less than 50% respiratory variability, suggesting right atrial pressure of 8 mmHg. IAS/Shunts: No atrial level shunt detected by color flow Doppler.  LEFT VENTRICLE PLAX 2D LVIDd:         3.70 cm   Diastology LVIDs:         2.20 cm   LV e' medial:    4.46 cm/s LV PW:         1.20 cm   LV E/e' medial:  22.6 LV IVS:        1.00 cm   LV e' lateral:   4.68 cm/s LVOT diam:     1.70 cm   LV E/e' lateral: 21.6 LV SV:         56 LV SV Index:   34 LVOT Area:     2.27 cm  RIGHT VENTRICLE             IVC RV S prime:  15.10 cm/s  IVC diam: 1.90 cm TAPSE (M-mode): 2.0 cm LEFT ATRIUM             Index        RIGHT ATRIUM           Index LA diam:        3.30 cm 2.03 cm/m   RA Area:     11.10 cm LA Vol (A2C):   47.6 ml 29.32 ml/m  RA Volume:   21.90 ml  13.49 ml/m LA Vol (A4C):   46.8 ml 28.82 ml/m LA Biplane Vol: 48.7 ml 30.00 ml/m  AORTIC VALVE LVOT Vmax:   118.00 cm/s LVOT Vmean:  79.800 cm/s LVOT VTI:    0.246 m AI PHT:      361 msec  AORTA Ao Root diam: 2.70 cm Ao Asc diam:  3.20 cm MITRAL VALVE                TRICUSPID VALVE MV Area (PHT): 3.17 cm     TR Peak grad:   50.4 mmHg MV Decel Time: 239 msec     TR Vmax:        355.00 cm/s MV E velocity: 101.00 cm/s MV A velocity: 116.00 cm/s  SHUNTS MV E/A ratio:  0.87         Systemic VTI:  0.25 m                             Systemic Diam: 1.70 cm Dani Gobble Croitoru MD Electronically signed by Sanda Klein MD Signature Date/Time: 09/20/2021/3:17:25 PM    Final    CT HEAD CODE STROKE WO CONTRAST  Result Date: 09/13/2021 CLINICAL DATA:  Code stroke. EXAM: CT HEAD WITHOUT CONTRAST TECHNIQUE: Contiguous axial images were obtained from the base of the skull through the vertex without intravenous contrast. RADIATION DOSE REDUCTION: This exam was performed according to the departmental dose-optimization program which includes automated exposure control, adjustment of the mA and/or kV according to patient size  and/or use of iterative reconstruction technique. COMPARISON:  None. FINDINGS: Brain: No evidence of acute infarction, hemorrhage, cerebral edema, mass, mass effect, or midline shift. No hydrocephalus. Left posterior fossa arachnoid cyst or chronic hygroma. Mild periventricular white matter changes, likely the sequela of chronic small vessel ischemic disease. Degree of cerebral volume loss is within normal limits for age. Vascular: No hyperdense vessel or unexpected calcification. Skull: Normal. Negative for fracture or focal lesion. Sinuses/Orbits: No acute finding. Other: The mastoid air cells are well aerated. ASPECTS [facility] Stroke Program Early CT Score) - Ganglionic level infarction (caudate, lentiform nuclei, internal capsule, insula, M1-M3 cortex): 7 - Supraganglionic infarction (M4-M6 cortex): 3 Total score (0-10 with 10 being normal): 10 IMPRESSION: 1. No acute intracranial process. 2. ASPECTS is 10 Code stroke imaging results were communicated on 09/13/2021 at 11:28 pm to provider Dr. Lorrin Goodell via secure text paging. Electronically Signed   By: Merilyn Baba M.D.   On: 09/13/2021 23:29    Microbiology: Results for orders placed or performed during the hospital encounter of 09/19/21  Resp Panel by RT-PCR (Flu A&B, Covid) Nasopharyngeal Swab     Status: None   Collection Time: 09/19/21  1:59 PM   Specimen: Nasopharyngeal Swab; Nasopharyngeal(NP) swabs in vial transport medium  Result Value Ref Range Status   SARS Coronavirus 2 by RT PCR NEGATIVE NEGATIVE Final    Comment: (NOTE) SARS-CoV-2 target nucleic acids are NOT DETECTED.  The SARS-CoV-2 RNA is  generally detectable in upper respiratory specimens during the acute phase of infection. The lowest concentration of SARS-CoV-2 viral copies this assay can detect is 138 copies/mL. A negative result does not preclude SARS-Cov-2 infection and should not be used as the sole basis for treatment or other patient management decisions. A negative  result may occur with  improper specimen collection/handling, submission of specimen other than nasopharyngeal swab, presence of viral mutation(s) within the areas targeted by this assay, and inadequate number of viral copies(<138 copies/mL). A negative result must be combined with clinical observations, patient history, and epidemiological information. The expected result is Negative.  Fact Sheet for Patients:  EntrepreneurPulse.com.au  Fact Sheet for Healthcare Providers:  IncredibleEmployment.be  This test is no t yet approved or cleared by the [place] FDA and  has been authorized for detection and/or diagnosis of SARS-CoV-2 by FDA under an Emergency Use Authorization (EUA). This EUA will remain  in effect (meaning this test can be used) for the duration of the COVID-19 declaration under Section 564(b)(1) of the Act, 21 U.S.C.section 360bbb-3(b)(1), unless the authorization is terminated  or revoked sooner.       Influenza A by PCR NEGATIVE NEGATIVE Final   Influenza B by PCR NEGATIVE NEGATIVE Final    Comment: (NOTE) The Xpert Xpress SARS-CoV-2/FLU/RSV plus assay is intended as an aid in the diagnosis of influenza from Nasopharyngeal swab specimens and should not be used as a sole basis for treatment. Nasal washings and aspirates are unacceptable for Xpert Xpress SARS-CoV-2/FLU/RSV testing.  Fact Sheet for Patients: EntrepreneurPulse.com.au  Fact Sheet for Healthcare Providers: IncredibleEmployment.be  This test is not yet approved or cleared by the [place] FDA and has been authorized for detection and/or diagnosis of SARS-CoV-2 by FDA under an Emergency Use Authorization (EUA). This EUA will remain in effect (meaning this test can be used) for the duration of the COVID-19 declaration under Section 564(b)(1) of the Act, 21 U.S.C. section 360bbb-3(b)(1), unless the authorization is  terminated or revoked.  Performed at [facility], [place] [address] [place], [place] [address]     Labs: CBC: Recent Labs  Lab 09/19/21 1131 09/20/21 0338 09/21/21 0049 09/22/21 0051  WBC 7.3 6.9 7.7 7.7  NEUTROABS  --   --  4.9 4.7  HGB 14.2 11.4* 11.7* 11.3*  HCT 42.7 36.1 34.2* 34.5*  MCV 97.9 100.8* 95.3 96.6  PLT 243 183 214 [id number]   Basic Metabolic Panel: Recent Labs  Lab 09/19/21 1131 09/20/21 0338 09/21/21 0049 09/22/21 0051  NA 130* 138 134* 134*  K 3.5 3.1* 3.2* 3.7  CL 97* 107 103 103  CO2 23 21* 21* 22  GLUCOSE 172* 104* 106* 103*  BUN 7* 7* 8 9  CREATININE 0.69 0.52 0.59 0.61  CALCIUM 10.1 9.3 9.5 9.7  MG  --  1.8  --   --    Liver Function Tests: Recent Labs  Lab 09/19/21 1131 09/20/21 0338  AST 29 21  ALT 20 17  ALKPHOS 83 60  BILITOT 0.5 0.4  PROT 7.2 5.5*  ALBUMIN 3.9 3.2*   CBG: Recent Labs  Lab 09/19/21 1541  GLUCAP 103*    Discharge time spent: greater than 30 minutes.  Signed: Hosie Poisson, MD [facility] 09/23/2021

## 2021-09-23 NOTE — Plan of Care (Signed)
°  Problem: Education: Goal: Knowledge of General Education information will improve Description: Including pain rating scale, medication(s)/side effects and non-pharmacologic comfort measures Outcome: Adequate for Discharge   Problem: Health Behavior/Discharge Planning: Goal: Ability to manage health-related needs will improve Outcome: Adequate for Discharge   Problem: Clinical Measurements: Goal: Ability to maintain clinical measurements within normal limits will improve Outcome: Adequate for Discharge Goal: Will remain free from infection Outcome: Adequate for Discharge Goal: Diagnostic test results will improve Outcome: Adequate for Discharge Goal: Respiratory complications will improve Outcome: Adequate for Discharge Goal: Cardiovascular complication will be avoided Outcome: Adequate for Discharge   Problem: Activity: Goal: Risk for activity intolerance will decrease Outcome: Adequate for Discharge   Problem: Nutrition: Goal: Adequate nutrition will be maintained Outcome: Adequate for Discharge   Problem: Coping: Goal: Level of anxiety will decrease Outcome: Adequate for Discharge   Problem: Elimination: Goal: Will not experience complications related to bowel motility Outcome: Adequate for Discharge Goal: Will not experience complications related to urinary retention Outcome: Adequate for Discharge   Problem: Pain Managment: Goal: General experience of comfort will improve Outcome: Adequate for Discharge   Problem: Safety: Goal: Ability to remain free from injury will improve Outcome: Adequate for Discharge   Problem: Skin Integrity: Goal: Risk for impaired skin integrity will decrease Outcome: Adequate for Discharge   Problem: Education: Goal: Knowledge of disease or condition will improve Outcome: Adequate for Discharge Goal: Knowledge of secondary prevention will improve (SELECT ALL) Outcome: Adequate for Discharge Goal: Knowledge of patient specific  risk factors will improve (INDIVIDUALIZE FOR PATIENT) Outcome: Adequate for Discharge Goal: Individualized Educational Video(s) Outcome: Adequate for Discharge   Problem: Coping: Goal: Will verbalize positive feelings about self Outcome: Adequate for Discharge Goal: Will identify appropriate support needs Outcome: Adequate for Discharge   Problem: Health Behavior/Discharge Planning: Goal: Ability to manage health-related needs will improve Outcome: Adequate for Discharge   Problem: Self-Care: Goal: Ability to participate in self-care as condition permits will improve Outcome: Adequate for Discharge Goal: Verbalization of feelings and concerns over difficulty with self-care will improve Outcome: Adequate for Discharge   Problem: Ischemic Stroke/TIA Tissue Perfusion: Goal: Complications of ischemic stroke/TIA will be minimized Outcome: Adequate for Discharge

## 2021-09-23 NOTE — Progress Notes (Signed)
Physical Therapy Treatment Patient Details Name: Linda [name] MRN: [id number] DOB: [birth year] Today's Date: 09/23/2021   History of Present Illness 88 yo female with onset of low intake, weakness, FTT, diarrhea and confusion was admitted from [facility] on 2/6.  Pt is cleared for a stroke change, but had low Na+, uncontrolled HTN and possible encephalopathy. 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  PMHx:  COPD, HTN, atherosclerosis, glaucoma, osteoporosis, thyroid disease    PT Comments    Pt agreeable to working with therapist this afternoon. Pt seems depressed about "not feeling well", does not like to eat because "nothing tastes good" and is bored as she "plays cards and walks to and from meals in the dining room and that's about it." Pt seems unaware that she is going to SNF this afternoon. Overall, pt is moving better today. Walking the unit with min guard. D/c plan remains appropriate at this time.    Recommendations for follow up therapy are one component of a multi-disciplinary discharge planning process, led by the attending physician.  Recommendations may be updated based on patient status, additional functional criteria and insurance authorization.  Follow Up Recommendations  Skilled nursing-short term rehab (<3 hours/day)     Assistance Recommended at Discharge Frequent or constant Supervision/Assistance  Patient can return home with the following A little help with walking and/or transfers;A little help with bathing/dressing/bathroom;Assistance with cooking/housework;Assist for transportation   Equipment Recommendations  Rollator (4 wheels)       Precautions / Restrictions Precautions Precautions: Fall Precaution Comments: HOH, wearing hearing devices Restrictions Weight Bearing Restrictions: No     Mobility  Bed Mobility Overal bed mobility: Needs Assistance Bed  Mobility: Sit to Supine     Supine to sit: Supervision Sit to supine: Min guard   General bed mobility comments: HOB elevated (assist with managing gown from underneath her otherwise no assist needed)    Transfers Overall transfer level: Needs assistance Equipment used: Rollator (4 wheels) Transfers: Sit to/from Stand Sit to Stand: Min guard           General transfer comment: min guard to stand safely    Ambulation/Gait Ambulation/Gait assistance: Min guard Gait Distance (Feet): 500 Feet Assistive device: Rollator (4 wheels) Gait Pattern/deviations: Step-through pattern, Decreased stride length, Trunk flexed Gait velocity: reduced Gait velocity interpretation: <1.8 ft/sec, indicate of risk for recurrent falls   General Gait Details: min guard for safety, overall steady gait with good navigation around obstacles,          Balance Overall balance assessment: Needs assistance Sitting-balance support: Feet supported Sitting balance-Leahy Scale: Fair     Standing balance support: Bilateral upper extremity supported, During functional activity Standing balance-Leahy Scale: Poor                              Cognition Arousal/Alertness: Awake/alert Behavior During Therapy: WFL for tasks assessed/performed Overall Cognitive Status: Impaired/Different from baseline Area of Impairment: Memory, Attention, Orientation                 Orientation Level: Situation Current Attention Level: Selective Memory: Decreased short-term memory     Awareness: Emergent   General Comments: does not remember working with therapy yesterday, continues to reiterate that she just does not feel well, can not remember that she is going to SNF this afternoon  General Comments General comments (skin integrity, edema, etc.): VSS on RA      Pertinent Vitals/Pain Pain Assessment Pain Assessment: No/denies pain     PT Goals (current goals can now be  found in the care plan section) Acute Rehab PT Goals Patient Stated Goal: to be able to concentrate and walk again like usual PT Goal Formulation: With patient/family Time For Goal Achievement: 10/04/21 Potential to Achieve Goals: Good Progress towards PT goals: Progressing toward goals    Frequency    Min 3X/week      PT Plan Current plan remains appropriate       AM-PAC PT "6 Clicks" Mobility   Outcome Measure  Help needed turning from your back to your side while in a flat bed without using bedrails?: A Little Help needed moving from lying on your back to sitting on the side of a flat bed without using bedrails?: A Little Help needed moving to and from a bed to a chair (including a wheelchair)?: A Little Help needed standing up from a chair using your arms (e.g., wheelchair or bedside chair)?: A Little Help needed to walk in hospital room?: A Little Help needed climbing 3-5 steps with a railing? : Total 6 Click Score: 16    End of Session Equipment Utilized During Treatment: Gait belt Activity Tolerance: Patient tolerated treatment well Patient left: in bed;with call bell/phone within reach;with family/visitor present Nurse Communication: Mobility status PT Visit Diagnosis: Unsteadiness on feet (R26.81);Muscle weakness (generalized) (M62.81);Other abnormalities of gait and mobility (R26.89)     Time: [id number] PT Time Calculation (min) (ACUTE ONLY): 17 min  Charges:  $Therapeutic Exercise: 8-22 mins                     Tysin Salada B. Migdalia Dk PT, DPT Acute Rehabilitation Services Pager [phone] Office [phone]    [place] [facility] 09/23/2021, 3:10 PM

## 2021-09-23 NOTE — Consult Note (Addendum)
ELECTROPHYSIOLOGY CONSULT NOTE  Patient ID: Linda [name] MRN: [id number], DOB/AGE: [id number]   Admit date: 09/19/2021 Date of Consult: 09/23/2021  Primary Physician: Vivi Barrack, MD Primary Cardiologist: Dr. Harrell Gave Reason for Consultation: Cryptogenic stroke -  recommendations regarding Implantable Loop Recorder, requested by Dr. Erlinda Hong  History of Present Illness Linda [name] was admitted on 09/19/2021 with intermittent confusion, word finding difficulty, generalized malaise (for a week or two), found with stroke.    PMHx includes: HTN, HLD, COPD, arthritis  The pt was referred to and seen once by Dr. Harrell Gave Nov 2022, for hx of an Atach on sotalol.  Discussed started back in 2019 in [place], with no available records for review reported by the pt and a family member with her at the time for palpitations and atrial tachycardia  Neurology notes: left posterior MCA infarct, embolic pattern, concerning for occult A-fib.  she has undergone workup for stroke including echocardiogram and carotid angio.  The patient has been monitored on telemetry which has demonstrated sinus rhythm with no arrhythmias.    Neurology has deferred TEE    Echocardiogram this admission demonstrated .    1. Left ventricular ejection fraction, by estimation, is 70 to 75%. The  left ventricle has hyperdynamic function. The left ventricle has no  regional wall motion abnormalities. Left ventricular diastolic parameters  are consistent with Grade II diastolic  dysfunction (pseudonormalization). Elevated left atrial pressure.   2. Right ventricular systolic function is normal. The right ventricular  size is normal. There is moderately elevated pulmonary artery systolic  pressure. The estimated right ventricular systolic pressure is [id number] mmHg.   3. The mitral valve is normal in structure. Trivial mitral valve  regurgitation.   4. Tricuspid valve regurgitation is mild to moderate.   5. The aortic valve is  tricuspid. There is mild calcification of the  aortic valve. Aortic valve regurgitation is mild. Aortic valve sclerosis  is present, with no evidence of aortic valve stenosis.   6. The inferior vena cava is normal in size with <50% respiratory  variability, suggesting right atrial pressure of 8 mmHg.   Comparison(s): Prior images reviewed side by side. The left ventricular  diastolic function is significantly worse. There is now evidence of  increased mean left atrial pressure, otherwise no significant change.   Lab work is reviewed.   Prior to admission, the patient denies chest pain, shortness of breath, dizziness, palpitations, or syncope.   She lives in an independent living retirement community, says she gets around fine, is a boring though. She is recovering from their stroke with plans to rehab at discharge.   Past Medical History:  Diagnosis Date   Arthritis    COPD (chronic obstructive pulmonary disease) (HCC)    chronic cough   Glaucoma    Hyperlipidemia    Hypertension    Irregular heartbeat    Osteoporosis    Thyroid disease    enlarged thyroid     Surgical History:  Past Surgical History:  Procedure Laterality Date   APPENDECTOMY     cateract surgery     COLONOSCOPY     Done in [place]  yrs ago   UPPER GASTROINTESTINAL ENDOSCOPY  2019   WRIST SURGERY       Medications Prior to Admission  Medication Sig Dispense Refill Last Dose   acetaminophen (TYLENOL) 500 MG tablet Take 1,000 mg by mouth every 6 (six) hours as needed.   09/19/2021   Cholecalciferol (VITAMIN D3) 25 MCG (1000 UT)  CAPS Take 1 capsule by mouth 2 (two) times daily.   09/19/2021   clobetasol cream (TEMOVATE) [id number] % Apply 1 application topically as needed. 60 g 5 Past Month   famotidine (PEPCID) 20 MG tablet Take 1 tablet (20 mg total) by mouth 2 (two) times daily. 180 tablet 3 09/19/2021   fluticasone (FLONASE) 50 MCG/ACT nasal spray USE 1 SPRAY IN EACH NOSTRIL DAILY AS NEEDED FOR ALLERGIES OR RHINITIS  48 g 5 Past Month   rosuvastatin (CRESTOR) 20 MG tablet Take 1 tablet (20 mg total) by mouth daily. 90 tablet 3 09/19/2021   sotalol (BETAPACE) 80 MG tablet Take 1 tablet (80 mg total) by mouth every 12 (twelve) hours. 180 tablet 3 09/19/2021 at 0900   timolol (TIMOPTIC) 0.25 % ophthalmic solution Place 1 drop into both eyes daily.   09/19/2021   amLODipine (NORVASC) 5 MG tablet Take 1 tablet (5 mg total) by mouth daily. (Patient not taking: Reported on 09/15/2021) 30 tablet 0 Not Taking   FLUAD 0.5 ML SUSY  (Patient not taking: Reported on 09/19/2021)   Completed Course    Inpatient Medications:   amLODipine  5 mg Oral Daily   aspirin EC  81 mg Oral Daily   azithromycin  500 mg Oral Daily   clopidogrel  75 mg Oral Daily   dextromethorphan-guaiFENesin  1 tablet Oral BID   enoxaparin (LOVENOX) injection  40 mg Subcutaneous Q24H   famotidine  20 mg Oral BID   hydrocortisone   Rectal TID   pantoprazole  40 mg Oral Daily   rosuvastatin  20 mg Oral Daily   sotalol  80 mg Oral Q12H   timolol  1 drop Both Eyes Daily    Allergies: No Known Allergies  Social History   Socioeconomic History   Marital status: Widowed    Spouse name: Not on file   Number of children: Not on file   Years of education: Not on file   Highest education level: Not on file  Occupational History   Occupation: Retired  Tobacco Use   Smoking status: Former    Packs/day: 0.25    Years: 2.00    Pack years: 0.50    Types: Cigarettes    Start date: 1950    Quit date: 1952    Years since quitting: 71.1   Smokeless tobacco: Never  Vaping Use   Vaping Use: Never used  Substance and Sexual Activity   Alcohol use: Not Currently   Drug use: Never   Sexual activity: Not on file  Other Topics Concern   Not on file  Social History Narrative   Resident at [employer]    Enjoys playing cards    Social Determinants of Health   Financial Resource Strain: Low Risk    Difficulty of Paying Living Expenses: Not hard at  all  Food Insecurity: No Food Insecurity   Worried About Charity fundraiser in the Last Year: Never true   Arboriculturist in the Last Year: Never true  Transportation Needs: No Transportation Needs   Lack of Transportation (Medical): No   Lack of Transportation (Non-Medical): No  Physical Activity: Inactive   Days of Exercise per Week: 0 days   Minutes of Exercise per Session: 0 min  Stress: No Stress Concern Present   Feeling of Stress : Only a little  Social Connections: Socially Isolated   Frequency of Communication with Friends and Family: Once a week   Frequency of Social Gatherings with  Friends and Family: More than three times a week   Attends Religious Services: Never   Marine scientist or Organizations: No   Attends Archivist Meetings: Never   Marital Status: Widowed  Human resources officer Violence: Not At Risk   Fear of Current or Ex-Partner: No   Emotionally Abused: No   Physically Abused: No   Sexually Abused: No     Family History  Problem Relation Age of Onset   Heart disease Mother    Heart disease Father    Heart disease Brother    Heart disease Brother    Colon cancer Neg Hx    Colon polyps Neg Hx    Esophageal cancer Neg Hx       Review of Systems: All other systems reviewed and are otherwise negative except as noted above.  Physical Exam: Vitals:   09/22/21 2114 09/22/21 2115 09/22/21 2305 09/23/21 0943  BP: (!) 144/66 (!) 144/66 (!) 135/50 (!) 145/55  Pulse: 70 70 70 73  Resp: 16 16 16 18   Temp:  (!) 97.4 F (36.3 C) 98.4 F (36.9 C) 97.6 F (36.4 C)  TempSrc:  Oral Oral Oral  SpO2: 97% 97% 95% 97%  Weight:      Height:        GEN- The patient is well appearing, alert and oriented x 3 today.   Head- normocephalic, atraumatic Eyes-  Sclera clear, conjunctiva pink Ears- hearing intact Oropharynx- clear Neck- supple Lungs- CTA b/l, normal work of breathing Heart- RRR, no murmurs, rubs or gallops  GI- soft, NT,  ND Extremities- no clubbing, cyanosis, or edema MS- no significant deformity, age appropriate atrophy Skin- no rash or lesion Psych- euthymic mood, full affect   Labs:   Lab Results  Component Value Date   WBC 7.7 09/22/2021   HGB 11.3 (L) 09/22/2021   HCT 34.5 (L) 09/22/2021   MCV 96.6 09/22/2021   PLT 213 09/22/2021    Recent Labs  Lab 09/20/21 0338 09/21/21 0049 09/22/21 0051  NA 138   < > 134*  K 3.1*   < > 3.7  CL 107   < > 103  CO2 21*   < > 22  BUN 7*   < > 9  CREATININE 0.52   < > 0.61  CALCIUM 9.3   < > 9.7  PROT 5.5*  --   --   BILITOT 0.4  --   --   ALKPHOS 60  --   --   ALT 17  --   --   AST 21  --   --   GLUCOSE 104*   < > 103*   < > = values in this interval not displayed.   No results found for: CKTOTAL, CKMB, CKMBINDEX, TROPONINI Lab Results  Component Value Date   CHOL 116 09/20/2021   CHOL 158 06/23/2020   CHOL 148 03/14/2019   Lab Results  Component Value Date   HDL 45 09/20/2021   HDL 57 06/23/2020   HDL 60.90 03/14/2019   Lab Results  Component Value Date   LDLCALC 51 09/20/2021   LDLCALC 77 06/23/2020   LDLCALC 60 03/14/2019   Lab Results  Component Value Date   TRIG 99 09/20/2021   TRIG 139 06/23/2020   TRIG 135.0 03/14/2019   Lab Results  Component Value Date   CHOLHDL 2.6 09/20/2021   CHOLHDL 2.8 06/23/2020   CHOLHDL 2 03/14/2019   No results found for: LDLDIRECT  No results  found for: DDIMER   Radiology/Studies:   CT ANGIO HEAD NECK W WO CM Result Date: 09/22/2021 CLINICAL DATA:  Stroke.  Determine embolic source. EXAM: CT ANGIOGRAPHY HEAD AND NECK TECHNIQUE: Multidetector CT imaging of the head and neck was performed using the standard protocol during bolus administration of intravenous contrast. Multiplanar CT image reconstructions and MIPs were obtained to evaluate the vascular anatomy. Carotid stenosis measurements (when applicable) are obtained utilizing NASCET criteria, using the distal internal carotid diameter  as the denominator. RADIATION DOSE REDUCTION: This exam was performed according to the departmental dose-optimization program which includes automated exposure control, adjustment of the mA and/or kV according to patient size and/or use of iterative reconstruction technique. CONTRAST:  38mL OMNIPAQUE IOHEXOL 350 MG/ML SOLN COMPARISON:  CT head 09/19/2021.  MRI head 09/21/2021 FINDINGS: CT HEAD FINDINGS Brain: Limited image quality. The patient had considerable pain and was not able to hold still. Patient has thoracic kyphosis and could not lay flat. There is extensive streak artifact in the brain. Ventricle size normal. No acute hemorrhage or mass. Restricted diffusion in the left parietal lobe is not identified by CT. Review of the MIP images confirms the above findings CTA NECK FINDINGS Aortic arch: Standard branching. Imaged portion shows no evidence of aneurysm or dissection. No significant stenosis of the major arch vessel origins. Mild atherosclerotic disease aortic arch and proximal great vessels Right carotid system: Atherosclerotic calcification right carotid bifurcation. 25% diameter stenosis proximal right internal carotid artery Left carotid system: Mild atherosclerotic disease left carotid bifurcation without stenosis Vertebral arteries: Both vertebral arteries patent to the basilar without stenosis Skeleton: Cervical spondylosis. No acute skeletal abnormality. Cervicothoracic kyphosis. Other neck: Enlarged thyroid bilaterally left greater than right. Substernal goiter extends into the superior mediastinum on the left splay sing the trachea to the right. Upper chest: Mild scarring in the apices.  No acute abnormality. Suboptimal image quality due to difficulty positioning the patient who was kyphotic and not able to lie flat. Review of the MIP images confirms the above findings CTA HEAD FINDINGS Anterior circulation: Internal carotid artery patent through the skull base and cavernous segment with mild  atherosclerotic disease. Anterior and middle cerebral arteries patent bilaterally without stenosis or occlusion. No aneurysm. Posterior circulation: Both vertebral arteries are patent to the basilar. PICA not visualized. AICA patent bilaterally. Basilar patent bilaterally. Posterior cerebral arteries patent bilaterally. Fetal origin right posterior cerebral artery. Venous sinuses: Normal venous enhancement Anatomic variants: None Image quality is suboptimal due to streak artifact from patient positioning Review of the MIP images confirms the above findings IMPRESSION: 1. Limited study. The patient is kyphotic and not able to lie flat. Patient not able to hold still. There is considerable artifact on the study with decreased spatial resolution. 2. Restricted diffusion left parietal lobe seen on MRI not visualized by CT. No acute hemorrhage. 3. Negative for intracranial large vessel occlusion or flow limiting stenosis 4. 25% diameter stenosis proximal right internal carotid artery. Left carotid widely patent 5. Both vertebral arteries widely patent. 6. Goiter with substernal extension on the left. Electronically Signed   By: Franchot Gallo M.D.   On: 09/22/2021 15:17   DG Chest 2 View Result Date: 09/14/2021 CLINICAL DATA:  Altered mental status EXAM: CHEST - 2 VIEW COMPARISON:  Previous studies including the chest radiograph done on 02/03/2019 FINDINGS: Cardiac size is within normal limits. There are no signs of pulmonary edema or focal pulmonary consolidation. Increase in AP diameter of chest suggests COPD. Linear densities in the  anterior mid lung fields seen in the lateral view may suggest minimal scarring or subsegmental atelectasis. In the AP view, there are few small linear densities immediately above the minor fissure in the right mid lung fields. There is prominence of soft tissues in the paratracheal region in the lower neck, possibly suggesting enlarged thyroid, more so on the left side. IMPRESSION: There  are no new infiltrates or signs of pulmonary edema. COPD. Linear densities in the anterior mid lung fields seen in the lateral view may suggest scarring or subsegmental atelectasis. Electronically Signed   By: Elmer Picker M.D.   On: 09/14/2021 16:29   CT Head Wo Contrast Result Date: 09/19/2021 CLINICAL DATA:  Mental status changes, ongoing weakness and failure to thrive, loss of appetite, diarrhea, history COPD, hypertension EXAM: CT HEAD WITHOUT CONTRAST TECHNIQUE: Contiguous axial images were obtained from the base of the skull through the vertex without intravenous contrast. RADIATION DOSE REDUCTION: This exam was performed according to the departmental dose-optimization program which includes automated exposure control, adjustment of the mA and/or kV according to patient size and/or use of iterative reconstruction technique. COMPARISON:  09/13/2021 FINDINGS: Brain: Generalized atrophy, greater posteriorly. Chronic LEFT cerebellar atrophy and prominence of CSF at LEFT posterior fossa. No midline shift or mass effect. Small vessel chronic ischemic changes of deep cerebral white matter. No intracranial hemorrhage, mass lesion or evidence of acute infarction. No extra-axial collection. Scattered nonspecific dural calcification. Vascular: Atherosclerotic calcifications of internal carotid and vertebral arteries at skull base Skull: Intact Sinuses/Orbits: Chronic opacification of LEFT sphenoid sinus. Nasal septal deviation to the LEFT. Other: N/A IMPRESSION: Atrophy with small vessel chronic ischemic changes of deep cerebral white matter. No acute intracranial abnormalities. Electronically Signed   By: Lavonia Dana M.D.   On: 09/19/2021 13:18   MR BRAIN WO CONTRAST Result Date: 09/21/2021 CLINICAL DATA:  Stroke follow-up EXAM: MRI HEAD WITHOUT CONTRAST TECHNIQUE: Multiplanar, multiecho pulse sequences of the brain and surrounding structures were obtained without intravenous contrast. COMPARISON:  Head CT  from 2 days ago.  Brain MRI 09/14/2021 FINDINGS: Brain: Patchy acute infarction in the left parietal lobe, primarily white matter with extension from subcortical white matter to the lateral ventricle. Branching hypointensity on gradient and T2 weighted imaging with nodular areas of decreased gradient signal in the left cerebellum, a large developmental venous anomaly with chronic congestive/microhemorrhages versus associated cavernomas/capillary telangiectasias. DVA appearance was confirmed on a 05/26/2020 CT with contrast. Remote insult with hemosiderin staining in the deep right cerebellum. Mild for age chronic small vessel ischemia and cerebral volume loss. Chronic CSF accumulation posterior to the left more than right cerebellum. No hydrocephalus or collection. Vascular: Left cerebellar findings as above. Skull and upper cervical spine: Normal marrow signal Sinuses/Orbits: Bilateral cataract resection. Chronic left sphenoid sinusitis with complete opacification by inspissated material. IMPRESSION: 1. Patchy acute infarction in the low left parietal lobe. 2. 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 Electronically Signed   By: Jorje Guild M.D.   On: 09/21/2021 19:03     12-lead ECG SR All prior EKG's in EPIC reviewed with no documented atrial fibrillation  Telemetry SR  Assessment and Plan:  1. Cryptogenic stroke The patient presents with cryptogenic stroke.  I spoke at length with the patient about monitoring for afib with either a 30 day event monitor or an implantable loop recorder.  Risks, benefits, and alteratives to implantable loop recorder were discussed with the patient today.   The patient is AAO x4, she can  not recall the name of her visitor (a family friend, friend of her daughter's), but appropriately tells me her name, that she is in the hospital and why, the year and president.  At this time, the patient is very clear that at her age, she is not interested in any  monitoring at all.  QTc looks OK, recommended keeping potasium better supplemented with goal 3.9 or better, mag looks ok for her sotalol  Dr. Quentin Ore will see her later today  Baldwin Jamaica, PA-C 09/23/2021

## 2021-09-23 NOTE — TOC Transition Note (Signed)
Transition of Care [facility]) - CM/SW Discharge Note   Patient Details  Name: Linda [name] MRN: [id number] Date of Birth: 1923/09/08  [facility]) CM/SW Contact:  Baldemar Lenis, LCSW Phone Number: 09/23/2021, 1:56 PM   Clinical Narrative:   Nurse to call report to [phone], Room 506.    Final next level of care: Skilled Nursing Facility Barriers to Discharge: Barriers Resolved   Patient Goals and CMS Choice Patient states their goals for this hospitalization and ongoing recovery are:: Pt states she would like to do whatever gets her better. CMS Medicare.gov Compare Post Acute Care list provided to:: Patient Represenative (must comment) (Daughter) Choice offered to / list presented to : Adult Children, Patient  Discharge Placement              Patient chooses bed at: [facility] Patient to be transferred to facility by: Family Name of family member notified: Self Patient and family notified of of transfer: 09/23/21  Discharge Plan and Services     Post Acute Care Choice: Skilled Nursing Facility                               Social Determinants of Health (SDOH) Interventions     Readmission Risk Interventions No flowsheet data found.

## 2021-09-23 NOTE — Progress Notes (Addendum)
STROKE TEAM PROGRESS NOTE   INTERVAL HISTORY Her daughter is at the bedside.  Patient sitting in bed, complaining of something stuck in her throat on the right side of the neck.  However, she stated that she had breakfast this morning without problem at all.  Pending CTA head and neck.  MRI showed left posterior MCA patchy infarct.  Denies heart palpitation.  However, seen Dr. Harrell Gave in 06/2021 for paroxysmal atrial tachycardia diagnosed years ago. Will be seen by Dr. Quentin Ore today.   Vitals:   09/22/21 2114 09/22/21 2115 09/22/21 2305 09/23/21 0943  BP: (!) 144/66 (!) 144/66 (!) 135/50 (!) 145/55  Pulse: 70 70 70 73  Resp: 16 16 16 18   Temp:  (!) 97.4 F (36.3 C) 98.4 F (36.9 C) 97.6 F (36.4 C)  TempSrc:  Oral Oral Oral  SpO2: 97% 97% 95% 97%  Weight:      Height:       CBC:  Recent Labs  Lab 09/21/21 0049 09/22/21 0051  WBC 7.7 7.7  NEUTROABS 4.9 4.7  HGB 11.7* 11.3*  HCT 34.2* 34.5*  MCV 95.3 96.6  PLT 214 497    Basic Metabolic Panel:  Recent Labs  Lab 09/20/21 0338 09/21/21 0049 09/22/21 0051  NA 138 134* 134*  K 3.1* 3.2* 3.7  CL 107 103 103  CO2 21* 21* 22  GLUCOSE 104* 106* 103*  BUN 7* 8 9  CREATININE 0.52 0.59 0.61  CALCIUM 9.3 9.5 9.7  MG 1.8  --   --     Lipid Panel:  Recent Labs  Lab 09/20/21 0338  CHOL 116  TRIG 99  HDL 45  CHOLHDL 2.6  VLDL 20  LDLCALC 51    HgbA1c:  Recent Labs  Lab 09/20/21 0338  HGBA1C 5.8*    Urine Drug Screen:  No results for input(s): LABOPIA, COCAINSCRNUR, LABBENZ, AMPHETMU, THCU, LABBARB in the last 168 hours.   Alcohol Level  No results for input(s): ETH in the last 168 hours.   IMAGING past 24 hours CT ANGIO HEAD NECK W WO CM  Result Date: 09/22/2021 CLINICAL DATA:  Stroke.  Determine embolic source. EXAM: CT ANGIOGRAPHY HEAD AND NECK TECHNIQUE: Multidetector CT imaging of the head and neck was performed using the standard protocol during bolus administration of intravenous contrast.  Multiplanar CT image reconstructions and MIPs were obtained to evaluate the vascular anatomy. Carotid stenosis measurements (when applicable) are obtained utilizing NASCET criteria, using the distal internal carotid diameter as the denominator. RADIATION DOSE REDUCTION: This exam was performed according to the departmental dose-optimization program which includes automated exposure control, adjustment of the mA and/or kV according to patient size and/or use of iterative reconstruction technique. CONTRAST:  54m OMNIPAQUE IOHEXOL 350 MG/ML SOLN COMPARISON:  CT head 09/19/2021.  MRI head 09/21/2021 FINDINGS: CT HEAD FINDINGS Brain: Limited image quality. The patient had considerable pain and was not able to hold still. Patient has thoracic kyphosis and could not lay flat. There is extensive streak artifact in the brain. Ventricle size normal. No acute hemorrhage or mass. Restricted diffusion in the left parietal lobe is not identified by CT. Review of the MIP images confirms the above findings CTA NECK FINDINGS Aortic arch: Standard branching. Imaged portion shows no evidence of aneurysm or dissection. No significant stenosis of the major arch vessel origins. Mild atherosclerotic disease aortic arch and proximal great vessels Right carotid system: Atherosclerotic calcification right carotid bifurcation. 25% diameter stenosis proximal right internal carotid artery Left carotid system: Mild  atherosclerotic disease left carotid bifurcation without stenosis Vertebral arteries: Both vertebral arteries patent to the basilar without stenosis Skeleton: Cervical spondylosis. No acute skeletal abnormality. Cervicothoracic kyphosis. Other neck: Enlarged thyroid bilaterally left greater than right. Substernal goiter extends into the superior mediastinum on the left splay sing the trachea to the right. Upper chest: Mild scarring in the apices.  No acute abnormality. Suboptimal image quality due to difficulty positioning the  patient who was kyphotic and not able to lie flat. Review of the MIP images confirms the above findings CTA HEAD FINDINGS Anterior circulation: Internal carotid artery patent through the skull base and cavernous segment with mild atherosclerotic disease. Anterior and middle cerebral arteries patent bilaterally without stenosis or occlusion. No aneurysm. Posterior circulation: Both vertebral arteries are patent to the basilar. PICA not visualized. AICA patent bilaterally. Basilar patent bilaterally. Posterior cerebral arteries patent bilaterally. Fetal origin right posterior cerebral artery. Venous sinuses: Normal venous enhancement Anatomic variants: None Image quality is suboptimal due to streak artifact from patient positioning Review of the MIP images confirms the above findings IMPRESSION: 1. Limited study. The patient is kyphotic and not able to lie flat. Patient not able to hold still. There is considerable artifact on the study with decreased spatial resolution. 2. Restricted diffusion left parietal lobe seen on MRI not visualized by CT. No acute hemorrhage. 3. Negative for intracranial large vessel occlusion or flow limiting stenosis 4. 25% diameter stenosis proximal right internal carotid artery. Left carotid widely patent 5. Both vertebral arteries widely patent. 6. Goiter with substernal extension on the left. Electronically Signed   By: Franchot Gallo M.D.   On: 09/22/2021 15:17    PHYSICAL EXAM  Physical Exam  Constitutional: Appears frail, elderly.  Psych: depressed mood Cardiovascular: Normal rate and regular rhythm.  Respiratory: Effort normal, non-labored breathing  Neuro - patient in depressed mood, awake alert, orientated to month, people and age, however not orientated to year or place, said "2022" and "rehab".  No aphasia, paucity of speech, follows most simple commands, able to name and repeat.  No gaze palsy, visual field full, however complaining of right visual field decreased  visual acuity.  facial symmetrical.  Tongue midline.  Bilateral upper extremity at least 4/5, symmetrical, right lower extremity 4/5, symmetrical  Sensation intact, finger-to-nose grossly intact bilaterally.  ASSESSMENT/PLAN [name] is a 88 y.o. female with history of hypertension, hyperlipidemia, COPD, glaucoma presenting with intermittent/recurrent confusion, difficulty finding words, poor appetite, generalized weakness, and a sensation of not feeling good that has been going on since 09/13/2021.   Stroke: left posterior MCA infarct, embolic pattern, concerning for occult A-fib Code Stroke no acute intracranial abnormalities, atrophy with small vessel chronic ischemic changes  CTA head & neck 09/22/2021 showed 25% diameter stenosis proximal right internal carotid artery MRI (09/14/2021) no acute intracranial processes/infarcts. Foci of hemosiderin deposition in the left-greater-than-right Repeat MRI 09/21/21- Patchy acute infarction in the low left parietal lobe 2D Echo EF 70 to 75% Pt refused loop recorder or 30 day cardiac event monitoring LDL 51 HgbA1c 5.8 VTE prophylaxis -Lovenox No antithrombotic prior to admission, now on aspirin 81 mg daily and Plavix 75 DAPT for 3 weeks and then ASA alone. Therapy recommendations: Pending, No SLP follow up Disposition: Pending  Paroxysmal atrial tachycardia Per daughter, this was diagnosed years ago with heart monitoring She saw Dr. Donella Stade for in 06/2021, continued on sotalol Given current stroke, concerning for occult A-fib Recommend long-term heart monitoring to rule out A-fib, however, pt refused  Hypertension Home meds: Sotalol, amlodipine Stable Long-term BP goal normotensive As needed hydralazine  Hyperlipidemia Home meds: Rosuvastatin 20 mg, resumed in hospital LDL 51, goal < 70 Continue statin on discharge  Other Stroke Risk Factors Advanced Age >/= 41  Former cigarette smoker Coronary artery disease Migraines Congestive  heart failure  Other Active Problems Depression/anxiety COPD with cough and phlegm, improved Pulmonary hygiene Cough, turn, deep breath, up out of bed as tolerated Dounebs and mucinex per primary team [facility] day # 4   Patient seen and examined by NP/APP with MD. MD to update note as needed.   Janine Ores, DNP, FNP-BC [facility] Pager: [phone]  ATTENDING NOTE: I reviewed above note and agree with the assessment and plan. Pt was seen and examined.   No family at bedside, however I met daughter on the hallway after rounding.  Patient awake alert, sitting in bed, no acute distress, however seems in depressed mood, kept saying " I am 88 years old and I have lived too long".  She refused loop recorder or cardiac event monitoring for further cardiac work-up.  Plan to admit to CIR today for rehab.  Continue DAPT for 3 weeks and then aspirin alone.  Continue statin.  Recommend psychiatry follow-up for depression.  Discussed with daughter in length.  For detailed assessment and plan, please refer to above as I have made changes wherever appropriate.   Neurology will sign off. Please call with questions. Pt will follow up with stroke clinic NP at [facility] in about 4 weeks. Thanks for the consult.   Rosalin Hawking, MD PhD Stroke Neurology 09/23/2021 7:42 PM     To contact Stroke Continuity provider, please refer to [email or web address] After hours, contact General Neurology

## 2021-09-28 ENCOUNTER — Telehealth: Payer: Self-pay

## 2021-09-28 NOTE — Telephone Encounter (Signed)
Immunization records faxed to [phone]

## 2021-09-28 NOTE — Telephone Encounter (Addendum)
Is requesting immunization record to be faxed over to [phone].  Was recently admitted after discharge from hospital.  Will fax over medical records request if needed.

## 2021-10-17 ENCOUNTER — Ambulatory Visit (INDEPENDENT_AMBULATORY_CARE_PROVIDER_SITE_OTHER): Payer: Medicare Other | Admitting: Physician Assistant

## 2021-10-17 ENCOUNTER — Encounter: Payer: Self-pay | Admitting: Physician Assistant

## 2021-10-17 VITALS — BP 100/60 | HR 64 | Ht 65.0 in | Wt 121.4 lb

## 2021-10-17 DIAGNOSIS — Z8719 Personal history of other diseases of the digestive system: Secondary | ICD-10-CM

## 2021-10-17 DIAGNOSIS — R0989 Other specified symptoms and signs involving the circulatory and respiratory systems: Secondary | ICD-10-CM | POA: Diagnosis not present

## 2021-10-17 DIAGNOSIS — R1310 Dysphagia, unspecified: Secondary | ICD-10-CM | POA: Diagnosis not present

## 2021-10-17 MED ORDER — FAMOTIDINE 20 MG PO TABS: 20.0000 mg | ORAL_TABLET | Freq: Two times a day (BID) | ORAL | 3 refills | Status: DC

## 2021-10-17 NOTE — Patient Instructions (Signed)
If you are age 86 or older, your body mass index should be between 23-30. Your Body mass index is 20.2 kg/m?Marland Kitchen If this is out of the aforementioned range listed, please consider follow up with your Primary Care Provider. ?________________________________________________________ ? ?The [facility] providers would like to encourage you to use [facility] to communicate with providers for non-urgent requests or questions.  Due to long hold times on the telephone, sending your provider a message by [facility] may be a faster and more efficient way to get a response.  Please allow 48 business hours for a response.  Please remember that this is for non-urgent requests.  ?_______________________________________________________ ? ?You have been scheduled for a Barium Esophogram at [facility] Radiology (1st floor of the hospital) on 10/21/2021 at 10:00 am. Please arrive 15 minutes prior to your appointment for registration. Make certain not to have anything to eat or drink 3 hours prior to your test. If you need to reschedule for any reason, please contact radiology at [phone] to do so. ?__________________________________________________________________ ?A barium swallow is an examination that concentrates on views of the esophagus. This tends to be a double contrast exam (barium and two liquids which, when combined, create a gas to distend the wall of the oesophagus) or single contrast (non-ionic iodine based). The study is usually tailored to your symptoms so a good history is essential. Attention is paid during the study to the form, structure and configuration of the esophagus, looking for functional disorders (such as aspiration, dysphagia, achalasia, motility and reflux) ?EXAMINATION ?You may be asked to change into a gown, depending on the type of swallow being performed. A radiologist and radiographer will perform the procedure. The radiologist will advise you of the ?type of contrast selected for your procedure and  direct you during the exam. You will be asked to stand, sit or lie in several different positions and to hold a small amount of fluid in your mouth before being asked to swallow while the imaging is performed .In some instances you may be asked to swallow barium coated marshmallows to assess the motility of a solid food bolus. ?The exam can be recorded as a digital or video fluoroscopy procedure. ?POST PROCEDURE ?It will take 1-2 days for the barium to pass through your system. To facilitate this, it is important, unless otherwise directed, to increase your fluids for the next 24-48hrs and to resume your normal diet.  ?This test typically takes about 30 minutes to perform. ?__________________________________________________________________________________ ? ?Continue Famotidine 20 mg twice daily ? ?Take pills in Applesauce or yogurt. ? ?Follow up pending the results of your Barium Swallow. ? ?Thank you for entrusting me with your care and choosing [facility]. ? ?Nicoletta Ba, PA-C ?

## 2021-10-17 NOTE — Progress Notes (Signed)
Subjective:    Patient ID: Linda [name], female    DOB: 05-09-1924, 88 y.o.   MRN: [id number]  HPI [name] is a pleasant 88 year old white female, established with Dr. Lavon Paganini, who comes in today with complaints of dysphagia and a sense of fullness in her throat. She was seen here in November 2021 at which time she had EGD and was found to have grade B esophagitis distally, and a few patchy erosions in the antrum.  Biopsies showed mild chronic gastritis, no H. pylori. Patient has history of hypertension, PAT, congestive heart failure, COPD, bronchiectasis, goiter and osteoarthritis. She was hospitalized in early February 2023 with altered mental status and was diagnosed with a left parietal CVA.  She is now on aspirin and Plavix. She is currently completing rehab at [facility]. Her daughter feels that she has made significant improvement, and patient appears to be mentating well though she feels that she is still having some difficulty thinking and says that she gets confused easily. She says she was not having any current problems with swallowing or any abnormal feeling in her throat until after the CVA.  Since then she has had a consistent sensation of fullness like something is in her throat.  No distinct complaints of odynophagia.  She is not having any difficulty with liquids.  She does feel at times that her pills will sit in her esophagus and she has to drink more fluids to push them down.  Not having any episodes requiring regurgitation.  No choking or coughing with eating. She seems to notice the fullness sensation more with solid food and also in between times when she is not eating.  She did have bedside swallow eval done while she was in the hospital-, no signs or symptoms of aspiration with straw sips, declined applesauce and solid at that time.  Speech-language pathology suspected and esophageal dysphagia, and recommended regular diet with thin liquids.  She has been on famotidine  20 mg p.o. twice daily since hospitalization, no complaints of heartburn or indigestion.  Per her daughter had been using this on a as needed basis prior to the hospitalization.  Review of Systems. Pertinent positive and negative review of systems were noted in the above HPI section.  All other review of systems was otherwise negative.   Outpatient Encounter Medications as of 10/17/2021  Medication Sig   acetaminophen (TYLENOL) 500 MG tablet Take 1,000 mg by mouth every 6 (six) hours as needed.   aspirin EC 81 MG EC tablet Take 1 tablet (81 mg total) by mouth daily. Swallow whole.   Cholecalciferol (VITAMIN D3) 25 MCG (1000 UT) CAPS Take 1 capsule by mouth 2 (two) times daily.   clobetasol cream (TEMOVATE) 0.05 % Apply 1 application topically as needed.   clopidogrel (PLAVIX) 75 MG tablet Take 1 tablet (75 mg total) by mouth daily.   dextromethorphan-guaiFENesin (MUCINEX DM) 30-600 MG 12hr tablet Take 1 tablet by mouth 2 (two) times daily.   hydrocortisone (ANUSOL-HC) 2.5 % rectal cream Place rectally 3 (three) times daily.   REMERON 15 MG tablet Take 15 mg by mouth at bedtime.   rosuvastatin (CRESTOR) 20 MG tablet Take 1 tablet (20 mg total) by mouth daily.   sotalol (BETAPACE) 80 MG tablet Take 1 tablet (80 mg total) by mouth every 12 (twelve) hours.   timolol (TIMOPTIC) 0.25 % ophthalmic solution Place 1 drop into both eyes daily.   [DISCONTINUED] famotidine (PEPCID) 20 MG tablet Take 1 tablet (20 mg total) by mouth  2 (two) times daily.   famotidine (PEPCID) 20 MG tablet Take 1 tablet (20 mg total) by mouth 2 (two) times daily.   No facility-administered encounter medications on file as of 10/17/2021.   No Known Allergies Patient Active Problem List   Diagnosis Date Noted   Cerebral thrombosis with cerebral infarction 09/22/2021   Hypokalemia 09/21/2021   Chronic diastolic CHF (congestive heart failure) (HCC) 09/21/2021   COPD (chronic obstructive pulmonary disease) (HCC) 09/21/2021    Altered mental status 09/19/2021   Osteoarthritis 08/12/2021   Dermatitis 04/13/2021   Multinodular goiter 11/19/2020   Hypercalcemia 11/19/2020   Hyperparathyroidism (HCC) 11/19/2020   Bronchiectasis without acute exacerbation (HCC) 07/14/2019   Chronic cough 02/03/2019   Thoracic back pain 10/13/2018   Hyperlipidemia 10/13/2018   Vitamin D deficiency 10/13/2018   Gastroesophageal reflux disease without esophagitis 10/13/2018   Chronic midline thoracic back pain 10/13/2018   Osteoporosis without current pathological fracture 10/13/2018   Essential hypertension 10/13/2018   Stenosis of carotid artery 10/13/2018   History of thyroid nodule, s/p bx, benign 10/13/2018   Aortic cusp regurgitation 10/13/2018   Nonrheumatic tricuspid valve regurgitation 10/13/2018   Coccyalgia 10/13/2018   Irritable bowel syndrome 10/13/2018   Bilateral hearing loss 10/13/2018   Chronic rhinitis 10/13/2018   Paroxysmal atrial tachycardia (HCC) 10/13/2018   Social History   Socioeconomic History   Marital status: Widowed    Spouse name: Not on file   Number of children: Not on file   Years of education: Not on file   Highest education level: Not on file  Occupational History   Occupation: Retired  Tobacco Use   Smoking status: Former    Packs/day: 0.25    Years: 2.00    Pack years: 0.50    Types: Cigarettes    Start date: 1950    Quit date: 1952    Years since quitting: 71.2    Passive exposure: Past   Smokeless tobacco: Never  Vaping Use   Vaping Use: Never used  Substance and Sexual Activity   Alcohol use: Not Currently   Drug use: Never   Sexual activity: Not on file  Other Topics Concern   Not on file  Social History Narrative   Resident at [employer]    Enjoys playing cards    Social Determinants of Health   Financial Resource Strain: Low Risk    Difficulty of Paying Living Expenses: Not hard at all  Food Insecurity: No Food Insecurity   Worried About Brewing technologist in the Last Year: Never true   Barista in the Last Year: Never true  Transportation Needs: No Transportation Needs   Lack of Transportation (Medical): No   Lack of Transportation (Non-Medical): No  Physical Activity: Inactive   Days of Exercise per Week: 0 days   Minutes of Exercise per Session: 0 min  Stress: No Stress Concern Present   Feeling of Stress : Only a little  Social Connections: Socially Isolated   Frequency of Communication with Friends and Family: Once a week   Frequency of Social Gatherings with Friends and Family: More than three times a week   Attends Religious Services: Never   Database administrator or Organizations: No   Attends Banker Meetings: Never   Marital Status: Widowed  Catering manager Violence: Not At Risk   Fear of Current or Ex-Partner: No   Emotionally Abused: No   Physically Abused: No   Sexually Abused: No  Ms. Yeck family history includes Heart disease in her brother, brother, father, and mother.      Objective:    Vitals:   10/17/21 1441  BP: 100/60  Pulse: 64    Physical Exam Well-developed thin, frail appearing very elderly white female in no acute distress.  Relates with a rolling walker, accompanied by her daughter  Weight, 121 BMI 20.2  HEENT; nontraumatic normocephalic, EOMI, PE R LA, sclera anicteric. Oropharynx; not examined today Neck; supple, no JVD Cardiovascular; regular rate and rhythm with S1-S2, no murmur rub or gallop Pulmonary; Clear bilaterally Abdomen; soft, nontender, nondistended, no palpable mass or hepatosplenomegaly, bowel sounds are active Rectal; not done Skin; benign exam, no jaundice rash or appreciable lesions Extremities; no clubbing cyanosis or edema skin warm and dry Neuro/Psych; alert and oriented x4, grossly nonfocal mood and affect appropriate        Assessment & Plan:   #3 88 year old white female status post left parietal CVA February 2023, on aspirin and  Plavix, who presents with complaints of globus sensation/fullness in the throat since the CVA, and some sensation of food/pills being slow to traverse. Negative bedside swallow eval during hospitalization  Patient does have previously documented grade B esophagitis at EGD November 2021, no stricture at that time.  I suspect her symptoms are in part neurogenic post recent CVA. Rule out component of dysmotility  #2 chronic GERD with previously documented grade B esophagitis at EGD.  #3 history of COPD/bronchiectasis 4.  Congestive heart failure 5.  History of goiter 6.  History of PAT 7.  Hypertension  Plan; continue famotidine 20 mg p.o. twice daily as maintenance We discussed small bites, eating slowly and sipping liquids between bites Encouraged her to try to take her pills with applesauce or yogurt We will schedule for barium swallow with a tablet. Further recommendations pending findings at barium swallow.  Zyshawn Bohnenkamp Oswald Hillock PA-C 10/17/2021   Cc: Ardith Dark, MD

## 2021-10-21 ENCOUNTER — Ambulatory Visit (HOSPITAL_COMMUNITY): Payer: Medicare Other

## 2021-10-25 ENCOUNTER — Other Ambulatory Visit: Payer: Self-pay | Admitting: Physician Assistant

## 2021-10-25 ENCOUNTER — Other Ambulatory Visit: Payer: Self-pay

## 2021-10-25 ENCOUNTER — Ambulatory Visit (HOSPITAL_COMMUNITY)
Admission: RE | Admit: 2021-10-25 | Discharge: 2021-10-25 | Disposition: A | Discharge status: Home / Self Care | Payer: Medicare Other | Source: Ambulatory Visit | Attending: Physician Assistant | Admitting: Physician Assistant

## 2021-10-25 DIAGNOSIS — Z8719 Personal history of other diseases of the digestive system: Secondary | ICD-10-CM

## 2021-10-25 DIAGNOSIS — R0989 Other specified symptoms and signs involving the circulatory and respiratory systems: Secondary | ICD-10-CM

## 2021-10-25 DIAGNOSIS — R1310 Dysphagia, unspecified: Secondary | ICD-10-CM

## 2021-10-25 IMAGING — RF DG ESOPHAGUS
10 of 11 series · 18 of 24 positions shown · non-contrast
Comparison: NONE.

CLINICAL DATA: Patient complaining of globus sensation/fullness in
the throat since her CVA. History of GERD and grade B esophagitis at
EGD.

EXAM:
ESOPHAGUS/BARIUM SWALLOW/TABLET STUDY
TECHNIQUE: Single contrast examination was performed using thin liquid barium.
This exam was performed by MADANI, NP, and was supervised and
interpreted by Dr. MADANI.
FLUOROSCOPY:
Radiation Exposure Index (as provided by the fluoroscopic device):
29.8 mGy Kerma

[Series 1: cp_standard · 0.34mm/px · 2 of 137 frames shown (1 of 10)]
[frame 21/137]
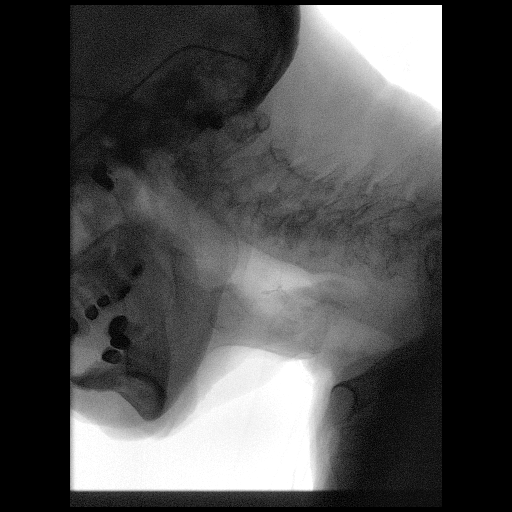
[frame 117/137]
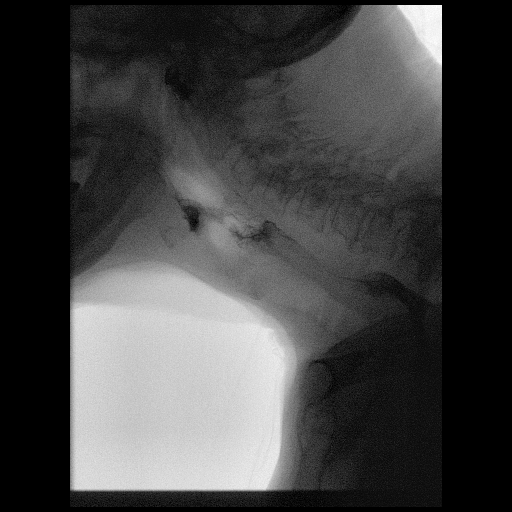

[Series 2: cp_standard · 0.34mm/px · 2 of 95 frames shown (2 of 10)]
[frame 48/95]
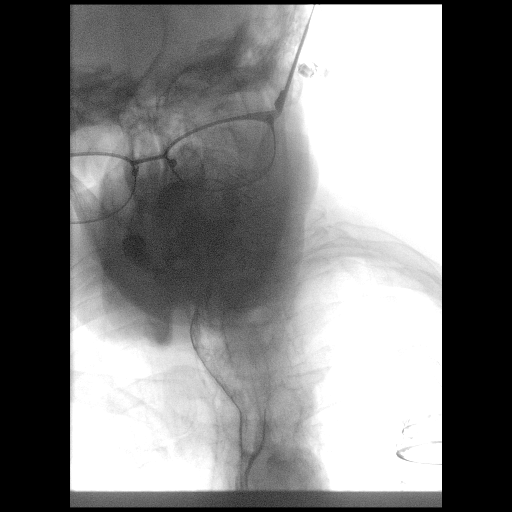
[frame 83/95]
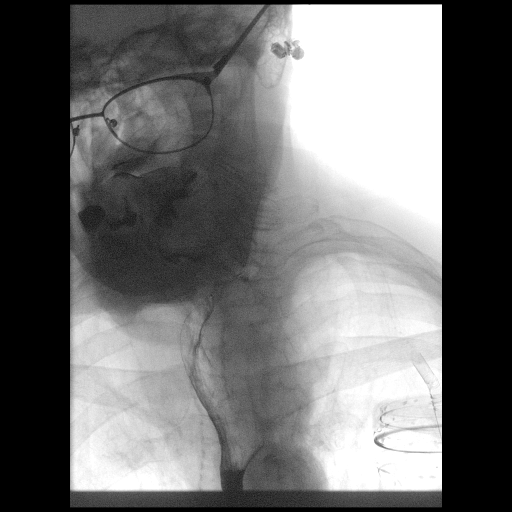

[Series 3: cp_standard · 0.34mm/px · 1 of 131 frames shown (3 of 10)]
[frame 107/131]
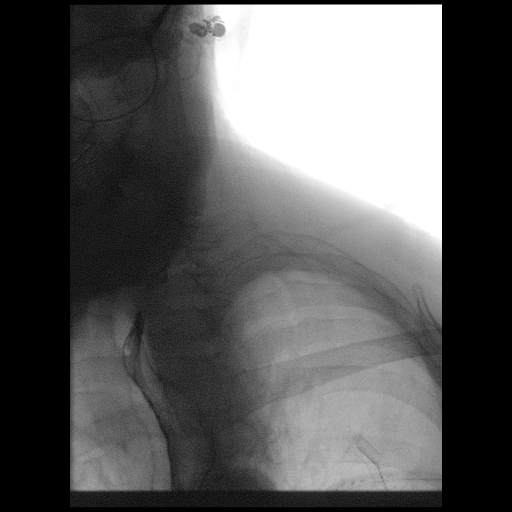

[Series 4: cp_standard · 0.34mm/px · 2 of 205 frames shown (4 of 10)]
[frame 31/205]
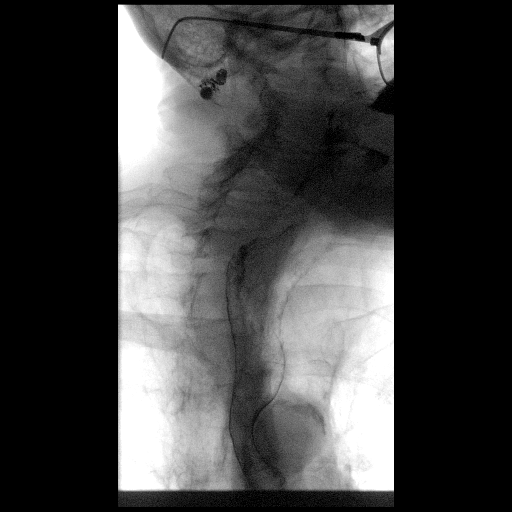
[frame 122/205]
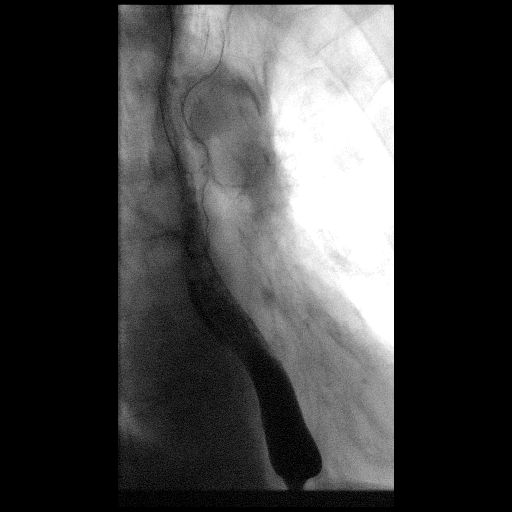

[Series 6: cp_standard · 0.35mm/px · 2 of 279 frames shown (5 of 10)]
[frame 42/279]
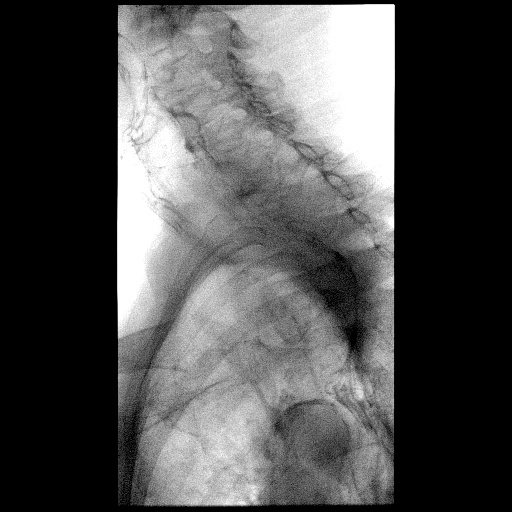
[frame 140/279]
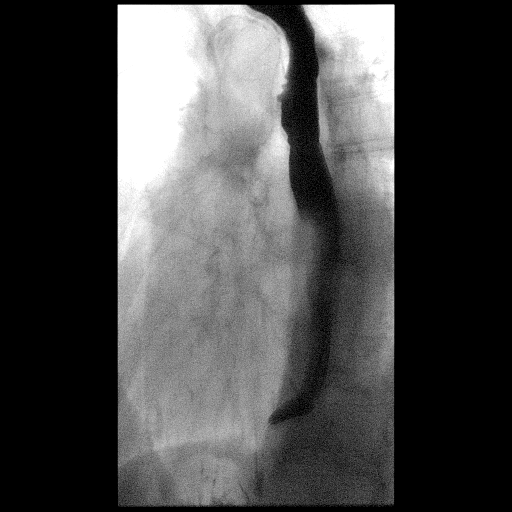

[Series 7: cp_standard · 0.35mm/px · 2 of 101 frames shown (6 of 10)]
[frame 16/101]
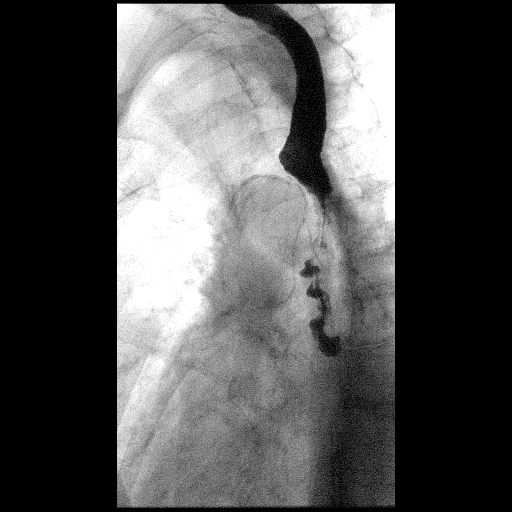
[frame 96/101]
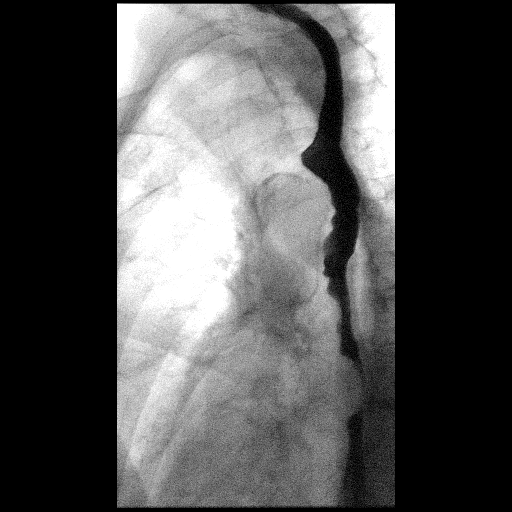

[Series 8: cp_standard · 0.38mm/px · 2 of 115 frames shown (7 of 10)]
[frame 45/115]
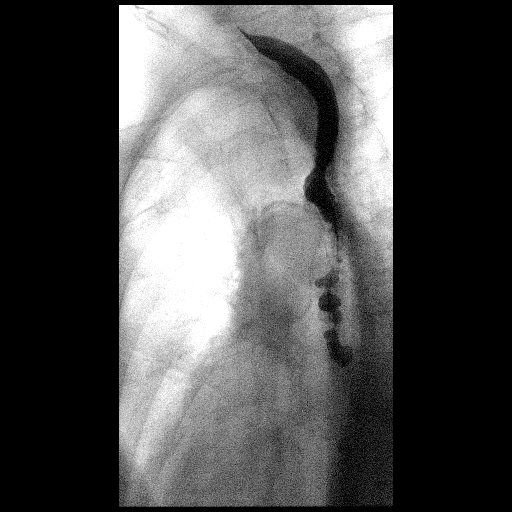
[frame 98/115]
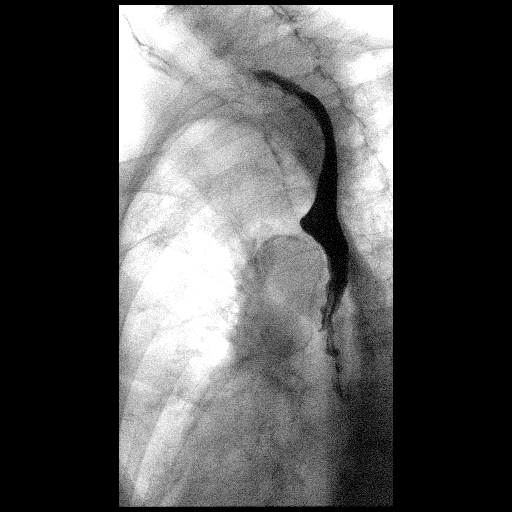

[Series 9: cp_standard · 0.38mm/px · 1 of 96 frames shown (8 of 10)]
[frame 82/96]
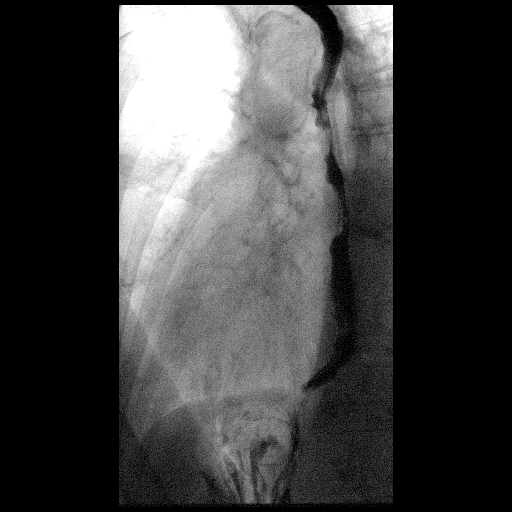

[Series 10: cp_standard · 0.38mm/px · 2 of 126 frames shown (9 of 10)]
[frame 19/126]
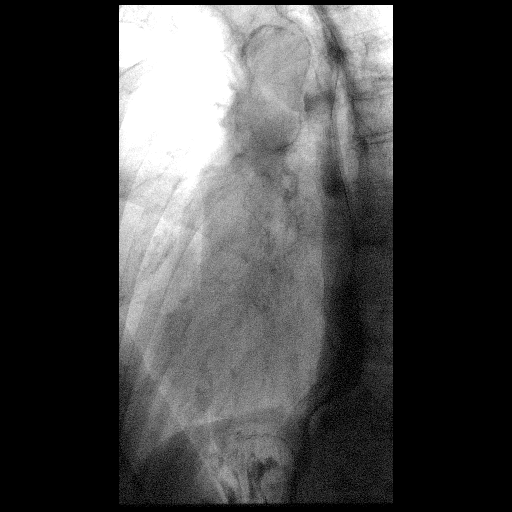
[frame 64/126]
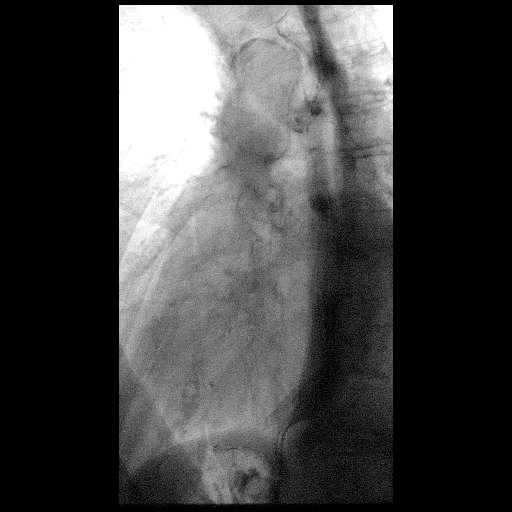

[Series 11: cp_standard · 0.35mm/px · 2 of 64 frames shown (10 of 10)]
[frame 16/64]
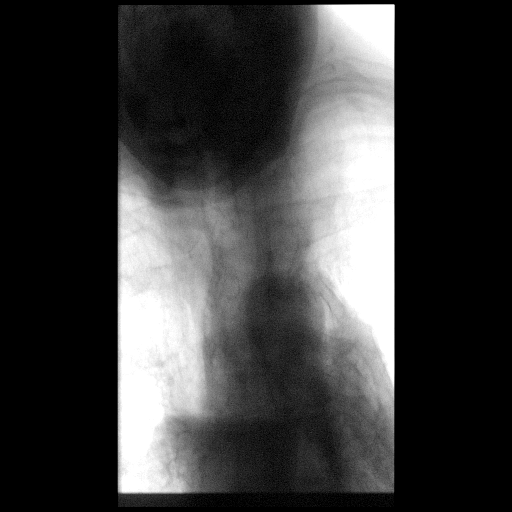
[frame 55/64]
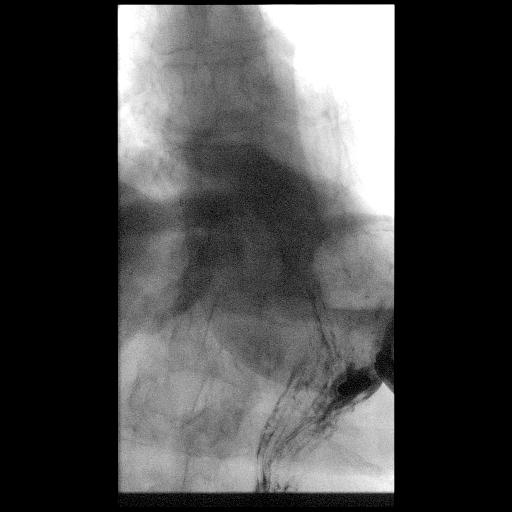

[18 of 24 positions shown; findings below may reference images not displayed]

FINDINGS: Swallowing: Appears normal. No vestibular penetration or aspiration
seen.

Pharynx: Unremarkable.

Esophagus: Normal appearance.

Esophageal motility: Esophageal dysmotility noted.

Hiatal Hernia: None.

Gastroesophageal reflux: None visualized.

Ingested 13mm barium tablet: Passed normally

Other: None.
IMPRESSION: There is mild esophageal dysmotility, which is not unexpected for
age, but does result in some retention of contrast that resolves
with subsequent swallows. No mass or stricture. Barium pill passed
without delay. Symptoms were not reproduced during study.

## 2021-11-07 NOTE — Progress Notes (Signed)
? ?NEUROLOGY CONSULTATION NOTE ? ?Linda [name] ?MRN: [id number] ?DOB: 1924-02-11 ? ?Referring provider: Gareth Morgan, MD (ED referral) ?Primary care provider: Dimas Chyle, MD ? ?Reason for consult:  stroke ? ?Assessment/Plan:  ? ?Left posterior MCA infarct, embolic - concerning for cardiac source such as a fib ?Paroxysmal atrial tachycardia ?Hypertension ?Hyperlipidemia ? ?1  Advised that she may discontinue Plavix and continue asa 81mg  daily alone for secondary stroke prevention ?2  Secondary stroke prevention as managed by PCP and cardiology ? Statin therapy:  LDL goal less than 70 ? Normotensive blood pressure ? Hgb A1c goal less than 7 ?3  Follow up with PCP or cardiology regarding blood pressure ?4  Has upcoming cardiology appointment.  Will discuss cardiac event monitor. ?5  Follow up 6 months. ? ?Subjective:  ?Linda [name] is a 88 year old right-handed female with HTN, HLD, COPD and glaucoma who presents for stroke.  History supplemented by primary care, ED and hospital records.  CT and MRI from hospital personally reviewed. ? ?On 09/13/2021, patient became confused.  Seen in the ED where BP was 217/74.  CT head unremarkable.  Follow up MRI of brain revealed left greater than right foci of hemosiderin deposition but no acute intracranial abnormalities.  Blood pressure was treated and symptoms improved but patient then left AMA.  The next day she started having word-finding difficulty.  Returned to the ED.  Labs showed no leukocytosis, electrolyte abnormalities.  Admission was recommended but patient again left AMA.  She continued to have language disorder and cognitive decline.  On 09/19/2021, she was advised by her PCP to go back to the ED for admission.  Repeat MRI of brain on 2/8 showed patchy acute infarct in the low left parietal lobe.  CTA head and neck showed 25% diameter stenosis proximal right internal carotid artery.  2D echocardiogram showed EF 70-75%.  LDL was 51 and Hgb A1c was 5.8.  Patient  has history of paroxysmal atrial tachycardia.  Patient refused loop recorder or 30 day cardiac event monitoring to evaluate for a fib.  She was discharged on ASA 81mg  and Plavix 75mg  daily for 3 weeks followed by ASA 81mg  daily alone.  She was living in independent living.  Now temporarily in [place] for rehab.  Waiting for opening in assisted living.  Language improved.  However, she feels more forgetful.  Having problems swallowing.  Saw GI.  Barium swallow unremarkable.  Thought to be secondary to stroke.  She also notes some more double vision.  Has upcoming appointment with ophthalmology. ?  ?Current medications:  ASA 81mg , Plavix 75mg , rosuvastatin 20mg  ? ?PAST MEDICAL HISTORY: ?Past Medical History:  ?Diagnosis Date  ? Arthritis   ? COPD (chronic obstructive pulmonary disease) ([place])   ? chronic cough  ? Glaucoma   ? Hyperlipidemia   ? Hypertension   ? Irregular heartbeat   ? Osteoporosis   ? Thyroid disease   ? enlarged thyroid  ? ? ?PAST SURGICAL HISTORY: ?Past Surgical History:  ?Procedure Laterality Date  ? APPENDECTOMY    ? cateract surgery    ? COLONOSCOPY    ? Done in [place]  yrs ago  ? UPPER GASTROINTESTINAL ENDOSCOPY  2019  ? WRIST SURGERY    ? ? ?MEDICATIONS: ?Current Outpatient Medications on File Prior to Visit  ?Medication Sig Dispense Refill  ? acetaminophen (TYLENOL) 500 MG tablet Take 1,000 mg by mouth every 6 (six) hours as needed.    ? aspirin EC 81 MG EC tablet Take 1  tablet (81 mg total) by mouth daily. Swallow whole. 30 tablet 11  ? Cholecalciferol (VITAMIN D3) 25 MCG (1000 UT) CAPS Take 1 capsule by mouth 2 (two) times daily.    ? clobetasol cream (TEMOVATE) [id number] % Apply 1 application topically as needed. 60 g 5  ? clopidogrel (PLAVIX) 75 MG tablet Take 1 tablet (75 mg total) by mouth daily. 30 tablet 1  ? dextromethorphan-guaiFENesin (MUCINEX DM) 30-600 MG 12hr tablet Take 1 tablet by mouth 2 (two) times daily.    ? famotidine (PEPCID) 20 MG tablet Take 1 tablet (20 mg total) by mouth 2  (two) times daily. 180 tablet 3  ? hydrocortisone (ANUSOL-HC) 2.5 % rectal cream Place rectally 3 (three) times daily. 30 g 0  ? REMERON 15 MG tablet Take 15 mg by mouth at bedtime.    ? rosuvastatin (CRESTOR) 20 MG tablet Take 1 tablet (20 mg total) by mouth daily. 90 tablet 3  ? sotalol (BETAPACE) 80 MG tablet Take 1 tablet (80 mg total) by mouth every 12 (twelve) hours. 180 tablet 3  ? timolol (TIMOPTIC) 0.25 % ophthalmic solution Place 1 drop into both eyes daily.    ? ?No current facility-administered medications on file prior to visit.  ? ? ?ALLERGIES: ?No Known Allergies ? ?FAMILY HISTORY: ?Family History  ?Problem Relation Age of Onset  ? Heart disease Mother   ? Heart disease Father   ? Heart disease Brother   ? Heart disease Brother   ? Colon cancer Neg Hx   ? Colon polyps Neg Hx   ? Esophageal cancer Neg Hx   ? ? ?Objective:  ?Blood pressure (!) 160/70, pulse 70, height 5\' 5"  (1.651 m), weight 123 lb 6.4 oz (56 kg), SpO2 95 %. ?General: No acute distress.  Patient appears well-groomed.   ?Head:  Normocephalic/atraumatic ?Eyes:  fundi examined but not visualized ?Neck: supple, no paraspinal tenderness, full range of motion ?Back: No paraspinal tenderness ?Heart: regular rate and rhythm ?Lungs: Clear to auscultation bilaterally. ?Vascular: No carotid bruits. ?Neurological Exam: ?Mental status: alert and oriented to person, place, and time, recent and remote memory intact, fund of knowledge intact, attention and concentration intact, speech fluent and not dysarthric, language intact. ?Cranial nerves: ?CN I: not tested ?CN II: pupils equal, round and reactive to light, visual fields intact ?CN III, IV, VI:  full range of motion, no nystagmus, no ptosis ?CN V: facial sensation intact. ?CN VII: upper and lower face symmetric ?CN VIII: hearing intact ?CN IX, X: gag intact, uvula midline ?CN XI: sternocleidomastoid and trapezius muscles intact ?CN XII: tongue midline ?Bulk & Tone: normal, no  fasciculations. ?Motor:  muscle strength 5-/5 left hip flexion (secondary to back pain), otherwise 5/5 throughout ?Sensation:  temperature and vibratory sensation intact. ?Deep Tendon Reflexes:  absent throughout,  toes downgoing.   ?Finger to nose testing:  Without dysmetria.   ?Heel to shin:  Without dysmetria.   ?Gait:  cautious broad-based.  Romberg with sway. ? ? ? ?Thank you for allowing me to take part in the care of this patient. ? ?Metta Clines, DO ? ?CC: Dimas Chyle, MD ? ? ? ? ?

## 2021-11-08 ENCOUNTER — Encounter: Payer: Self-pay | Admitting: Neurology

## 2021-11-08 ENCOUNTER — Ambulatory Visit (INDEPENDENT_AMBULATORY_CARE_PROVIDER_SITE_OTHER): Payer: Medicare Other | Admitting: Neurology

## 2021-11-08 ENCOUNTER — Other Ambulatory Visit: Payer: Self-pay

## 2021-11-08 VITALS — BP 160/70 | HR 70 | Ht 65.0 in | Wt 123.4 lb

## 2021-11-08 DIAGNOSIS — E785 Hyperlipidemia, unspecified: Secondary | ICD-10-CM | POA: Diagnosis not present

## 2021-11-08 DIAGNOSIS — I63412 Cerebral infarction due to embolism of left middle cerebral artery: Secondary | ICD-10-CM | POA: Diagnosis not present

## 2021-11-08 DIAGNOSIS — I471 Supraventricular tachycardia: Secondary | ICD-10-CM | POA: Diagnosis not present

## 2021-11-08 DIAGNOSIS — I10 Essential (primary) hypertension: Secondary | ICD-10-CM

## 2021-11-08 NOTE — Progress Notes (Signed)
Reviewed and agree with documentation and assessment and plan. K. Veena Belvia Gotschall , MD   

## 2021-11-08 NOTE — Addendum Note (Signed)
Addended byEverlena Cooper, Addilynne Olheiser R on: 11/08/2021 11:50 AM ? ? Modules accepted: Orders ? ?

## 2021-11-08 NOTE — Patient Instructions (Signed)
Stop Plavix.  Continue aspirin 81mg  daily ?Continue rosuvastatin ?Follow up with PCP or cardiologist regarding blood pressure ?Must continue exercises given to you by your therapists ?Follow up in 6 months. ?

## 2021-11-14 ENCOUNTER — Ambulatory Visit (INDEPENDENT_AMBULATORY_CARE_PROVIDER_SITE_OTHER): Payer: Medicare Other | Admitting: Cardiology

## 2021-11-14 ENCOUNTER — Encounter (HOSPITAL_BASED_OUTPATIENT_CLINIC_OR_DEPARTMENT_OTHER): Payer: Self-pay | Admitting: Cardiology

## 2021-11-14 VITALS — BP 154/52 | HR 76 | Ht 65.0 in | Wt 124.0 lb

## 2021-11-14 DIAGNOSIS — I361 Nonrheumatic tricuspid (valve) insufficiency: Secondary | ICD-10-CM

## 2021-11-14 DIAGNOSIS — I471 Supraventricular tachycardia: Secondary | ICD-10-CM

## 2021-11-14 DIAGNOSIS — I63412 Cerebral infarction due to embolism of left middle cerebral artery: Secondary | ICD-10-CM | POA: Diagnosis not present

## 2021-11-14 DIAGNOSIS — Z8673 Personal history of transient ischemic attack (TIA), and cerebral infarction without residual deficits: Secondary | ICD-10-CM | POA: Diagnosis not present

## 2021-11-14 DIAGNOSIS — I10 Essential (primary) hypertension: Secondary | ICD-10-CM | POA: Diagnosis not present

## 2021-11-14 DIAGNOSIS — Z09 Encounter for follow-up examination after completed treatment for conditions other than malignant neoplasm: Secondary | ICD-10-CM

## 2021-11-14 MED ORDER — AMLODIPINE BESYLATE 2.5 MG PO TABS: 2.5000 mg | ORAL_TABLET | Freq: Every day | ORAL | 3 refills | Status: DC

## 2021-11-14 NOTE — Patient Instructions (Signed)
Medication Instructions:  ?1). START: Amlodpine 2.5 mg daily ? ?Please check blood pressure three times a week and contact the office if BP less than 100/50 or more than 170/100 ? ?*If you need a refill on your cardiac medications before your next appointment, please call your pharmacy* ? ? ?Lab Work: ?None ordered today ? ? ?Testing/Procedures: ?None ordered today ? ? ?Follow-Up: ?At [facility], you and your health needs are our priority.  As part of our continuing mission to provide you with exceptional heart care, we have created designated Provider Care Teams.  These Care Teams include your primary Cardiologist (physician) and Advanced Practice Providers (APPs -  Physician Assistants and Nurse Practitioners) who all work together to provide you with the care you need, when you need it. ? ?We recommend signing up for the patient portal called "MyChart".  Sign up information is provided on this After Visit Summary.  MyChart is used to connect with patients for Virtual Visits (Telemedicine).  Patients are able to view lab/test results, encounter notes, upcoming appointments, etc.  Non-urgent messages can be sent to your provider as well.   ?To learn more about what you can do with MyChart, go to [email or web address].   ? ?Your next appointment:   ?2 -3 month(s) ? ?The format for your next appointment:   ?In Person ? ?Provider:   ?Jodelle Red, MD{ ? ?

## 2021-11-14 NOTE — Progress Notes (Signed)
?Cardiology Office Note:   ? ?Date:  11/14/2021  ? ?ID:  Linda [name], DOB August 19, 1923, MRN [id number] ? ?PCP:  Vivi Barrack, MD  ?Cardiologist:  Buford Dresser, MD ? ?Referring MD: Vivi Barrack, MD  ? ?CC: Follow-up ? ? ?History of Present Illness:   ? ?Linda [name] is a 88 y.o. female with a hx of CVA, hypertension, hyperlipidemia, COPD, and arthritis, who is seen for follow-up. I initially met her 06/27/2021 as a new consult at the request of Vivi Barrack, MD for the evaluation and management of paroxysmal atrial tachycardia. ? ?History: She was previously under the care of Dr. Alessandra Bevels, [facility], [facility], with cardiology (placed on sotalol in 2019). Her prior PCP was Dr. Roselee Culver, [place]. None of these records are available in [place]. ? ?Today: ?Since her last visit she was admitted to the hospital 09/19/2021 with multiple symptoms concerning for stroke. She was found to have a left parietal infarct of unknown etiology. Since her hospitalization she has been on Plavix, which was discontinued last week. ? ?We reviewed her hospitalization and recommendations. We spent extensive time today discussing cardiac monitors. We discussed the difference between a loop monitor and an event monitor. We discussed that if afib is found, the recommendation is for long term anticoagulation. Patient is not sure she is interested in any of of this, but she will think about it. ? ?She is accompanied by a family member, who also provides the history. Overall, she is feeling okay aside from severe, constant back pain that they believe is due to a compression fracture.  They deny any recent falls. ? ?Her blood pressure is not monitored at home. At a recent visit with her GI doctor her BP was 100/60. Her readings have been fluctuating at other clinic visits. Most of her recent BP measurements are charted as 924-268 systolic. ? ?Typically she feels very dry and  thirsty after waking up in the mornings. They note this may be due to not drinking enough during the day. ? ?She denies any palpitations, chest pain, shortness of breath, or peripheral edema. No lightheadedness, headaches, syncope, orthopnea, or PND. ? ?Past Medical History:  ?Diagnosis Date  ? Arthritis   ? COPD (chronic obstructive pulmonary disease) ([place])   ? chronic cough  ? Glaucoma   ? Hyperlipidemia   ? Hypertension   ? Irregular heartbeat   ? Osteoporosis   ? Thyroid disease   ? enlarged thyroid  ? ? ?Past Surgical History:  ?Procedure Laterality Date  ? APPENDECTOMY    ? cateract surgery    ? COLONOSCOPY    ? Done in [place]  yrs ago  ? UPPER GASTROINTESTINAL ENDOSCOPY  2019  ? WRIST SURGERY    ? ? ?Current Medications: ?Current Outpatient Medications on File Prior to Visit  ?Medication Sig  ? acetaminophen (TYLENOL) 500 MG tablet Take 1,000 mg by mouth every 6 (six) hours as needed.  ? aspirin EC 81 MG EC tablet Take 1 tablet (81 mg total) by mouth daily. Swallow whole.  ? Cholecalciferol (VITAMIN D3) 25 MCG (1000 UT) CAPS Take 1 capsule by mouth 2 (two) times daily.  ? clobetasol cream (TEMOVATE) 3.41 % Apply 1 application topically as needed.  ? dextromethorphan-guaiFENesin (MUCINEX DM) 30-600 MG 12hr tablet Take 1 tablet by mouth 2 (two) times daily.  ? famotidine (PEPCID) 20 MG tablet Take 1 tablet (20 mg total) by mouth 2 (two) times daily.  ?  oxycodone-acetaminophen (LYNOX) 2.5-300 MG per tablet Take 1 tablet by mouth every 4 (four) hours as needed for pain.  ? REMERON 15 MG tablet Take 15 mg by mouth at bedtime.  ? rosuvastatin (CRESTOR) 20 MG tablet Take 1 tablet (20 mg total) by mouth daily.  ? sotalol (BETAPACE) 80 MG tablet Take 1 tablet (80 mg total) by mouth every 12 (twelve) hours.  ? timolol (TIMOPTIC) 0.25 % ophthalmic solution Place 1 drop into both eyes daily.  ? ?No current facility-administered medications on file prior to visit.  ?  ? ?Allergies:   Patient has no known allergies.  ? ?Social  History  ? ?Tobacco Use  ? Smoking status: Former  ?  Packs/day: 0.25  ?  Years: 2.00  ?  Pack years: 0.50  ?  Types: Cigarettes  ?  Start date: 1950  ?  Quit date: 35  ?  Years since quitting: 71.3  ?  Passive exposure: Past  ? Smokeless tobacco: Never  ?Vaping Use  ? Vaping Use: Never used  ?Substance Use Topics  ? Alcohol use: Not Currently  ? Drug use: Never  ? ? ?Family History: ?family history includes Heart disease in her brother, brother, father, and mother; Stroke in her mother. There is no history of Colon cancer, Colon polyps, or Esophageal cancer. ? ?ROS:   ?Please see the history of present illness. ?(+) Severe back pain ?(+) Dry mouth ?All other systems are reviewed and negative.  ? ? ?EKGs/Labs/Other Studies Reviewed:   ? ?The following studies were reviewed today: ? ?Echo 09/20/2021: ? 1. Left ventricular ejection fraction, by estimation, is 70 to 75%. The  ?left ventricle has hyperdynamic function. The left ventricle has no  ?regional wall motion abnormalities. Left ventricular diastolic parameters  ?are consistent with Grade II diastolic  ?dysfunction (pseudonormalization). Elevated left atrial pressure.  ? 2. Right ventricular systolic function is normal. The right ventricular  ?size is normal. There is moderately elevated pulmonary artery systolic  ?pressure. The estimated right ventricular systolic pressure is 40.9 mmHg.  ? 3. The mitral valve is normal in structure. Trivial mitral valve  ?regurgitation.  ? 4. Tricuspid valve regurgitation is mild to moderate.  ? 5. The aortic valve is tricuspid. There is mild calcification of the  ?aortic valve. Aortic valve regurgitation is mild. Aortic valve sclerosis  ?is present, with no evidence of aortic valve stenosis.  ? 6. The inferior vena cava is normal in size with <50% respiratory  ?variability, suggesting right atrial pressure of 8 mmHg.  ? ?Comparison(s): Prior images reviewed side by side. The left ventricular  ?diastolic function is  significantly worse. There is now evidence of  ?increased mean left atrial pressure, otherwise no significant change. ? ?Carotid Arterial Duplex 10/04/2020: ?Summary:  ?Right Carotid: Velocities in the right ICA are consistent with a 1-39%  ?stenosis.  ? ?Left Carotid: Velocities in the left ICA are consistent with a 1-39%  ?stenosis.  ? ?Vertebrals: Bilateral vertebral arteries demonstrate antegrade flow.  ? ?Echo 09/30/2019: ? 1. Left ventricular ejection fraction, by estimation, is 60 to 65%. The  ?left ventricle has normal function. The left ventricle has no regional  ?wall motion abnormalities. Left ventricular diastolic parameters are  ?consistent with age-related delayed  ?relaxation (normal).  ? 2. Right ventricular systolic function is normal. The right ventricular  ?size is normal. There is moderately elevated pulmonary artery systolic  ?pressure. The estimated right ventricular systolic pressure is 81.1 mmHg.  ? 3. Left atrial size  was mildly dilated.  ? 4. Right atrial size was mildly dilated.  ? 5. The mitral valve is degenerative. Mild mitral valve regurgitation.  ? 6. The tricuspid valve is myxomatous. Tricuspid valve regurgitation is  ?moderate to severe.  ? 7. The aortic valve is tricuspid. Aortic valve regurgitation is mild.  ?Mild aortic valve sclerosis is present, with no evidence of aortic valve  ?stenosis.  ? 8. The inferior vena cava is normal in size with <50% respiratory  ?variability, suggesting right atrial pressure of 8 mmHg.  ? ?EKG:  EKG is personally reviewed.   ?11/14/2021: EKG was not ordered. ?06/27/2021: SR, 1st degree AV block, LVH at 64 bpm ? ?Recent Labs: ?09/15/2021: TSH 1.23 ?09/20/2021: ALT 17; B Natriuretic Peptide 255.2; Magnesium 1.8 ?09/22/2021: BUN 9; Creatinine, Ser 0.61; Hemoglobin 11.3; Platelets 213; Potassium 3.7; Sodium 134  ? ?Recent Lipid Panel ?   ?Component Value Date/Time  ? CHOL 116 09/20/2021 0338  ? TRIG 99 09/20/2021 0338  ? HDL 45 09/20/2021 0338  ? CHOLHDL 2.6  09/20/2021 0338  ? VLDL 20 09/20/2021 0338  ? [place] 51 09/20/2021 0338  ? [place] 77 06/23/2020 1027  ? ? ?Physical Exam:   ? ?VS:  BP (!) 154/52   Pulse 76   Ht 5' 5"  (1.651 m)   Wt 124 lb (56.2 kg)   SpO2 96%   B

## 2021-11-15 ENCOUNTER — Encounter: Payer: Self-pay | Admitting: Orthopaedic Surgery

## 2021-11-15 ENCOUNTER — Ambulatory Visit: Payer: Self-pay

## 2021-11-15 ENCOUNTER — Ambulatory Visit (INDEPENDENT_AMBULATORY_CARE_PROVIDER_SITE_OTHER): Payer: Medicare Other | Admitting: Orthopaedic Surgery

## 2021-11-15 VITALS — BP 159/68 | HR 69 | Ht 65.0 in | Wt 124.0 lb

## 2021-11-15 DIAGNOSIS — I63412 Cerebral infarction due to embolism of left middle cerebral artery: Secondary | ICD-10-CM | POA: Diagnosis not present

## 2021-11-15 DIAGNOSIS — M545 Low back pain, unspecified: Secondary | ICD-10-CM | POA: Diagnosis not present

## 2021-11-18 ENCOUNTER — Ambulatory Visit
Admission: RE | Admit: 2021-11-18 | Discharge: 2021-11-18 | Disposition: A | Discharge status: Home / Self Care | Payer: Medicare Other | Source: Ambulatory Visit | Attending: Orthopaedic Surgery | Admitting: Orthopaedic Surgery

## 2021-11-18 DIAGNOSIS — M545 Low back pain, unspecified: Secondary | ICD-10-CM

## 2021-11-18 IMAGING — MR MR LUMBAR SPINE W/O CM
4 of 5 series · 25 of 48 positions shown · non-contrast
Comparison: Lumbar spine radiographs [DATE]. Lumbar spine
radiographs [DATE].

CLINICAL DATA: Provided history: Acute midline low back pain,
unspecified whether sciatica present. Compression fracture, lumbar;
compression fracture, subacute L2, L3. Additional history provided
by scanning technologist: Patient reports compression fracture
diagnosed 2 weeks ago.

EXAM:
MRI LUMBAR SPINE WITHOUT CONTRAST
TECHNIQUE: Multiplanar, multisequence MR imaging of the lumbar spine was
performed. No intravenous contrast was administered.

[Series 3: T2 · sagittal · 4.0mm · 1.09mm/px · 6 of 19 slices shown (1 of 2)]
[im 1/19]
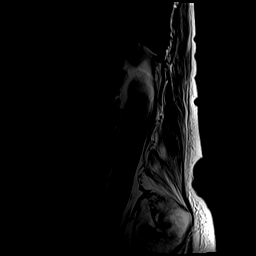
[im 4/19]
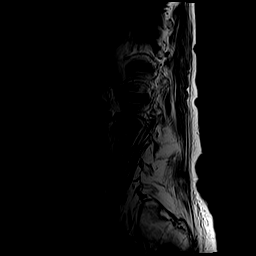
[im 8/19]
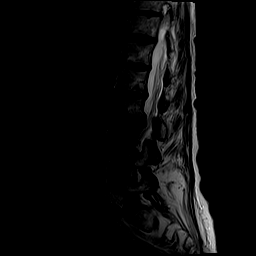
[im 11/19]
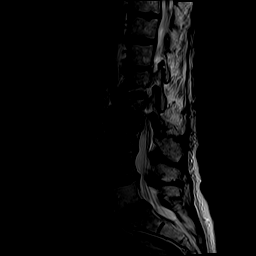
[im 15/19]
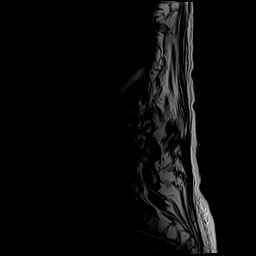
[im 19/19]
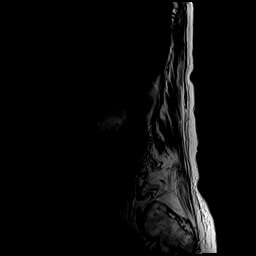

[Series 5: T1 · sagittal · 4.0mm · 1.09mm/px · 7 of 19 slices shown (1 of 2)]
[im 1/19]
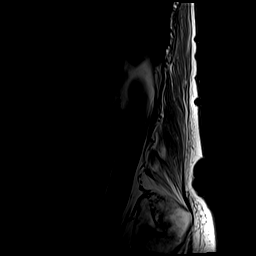
[im 4/19]
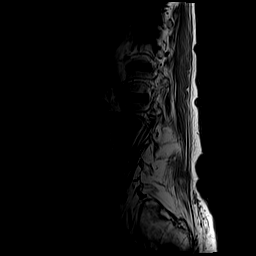
[im 7/19]
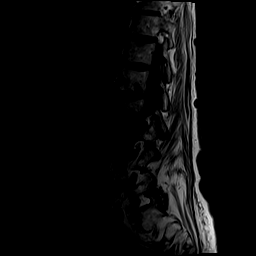
[im 10/19]
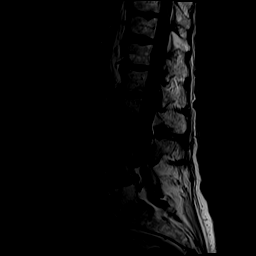
[im 13/19]
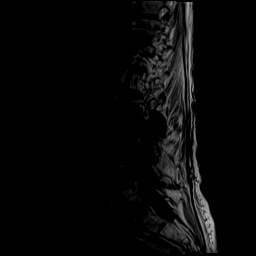
[im 16/19]
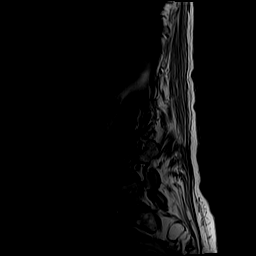
[im 19/19]
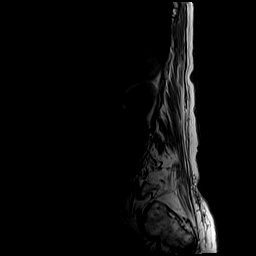

[Series 6: T2 · axial · 4.0mm · 0.39mm/px · z∈[-19,+200]mm · 8 of 41 slices shown (2 of 2)]
[im 1/41]
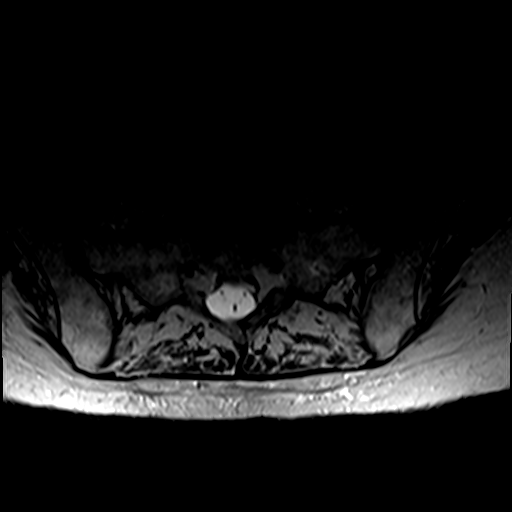
[im 7/41]
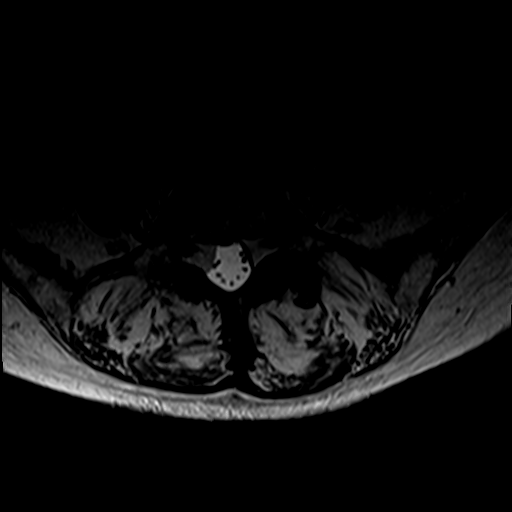
[im 13/41]
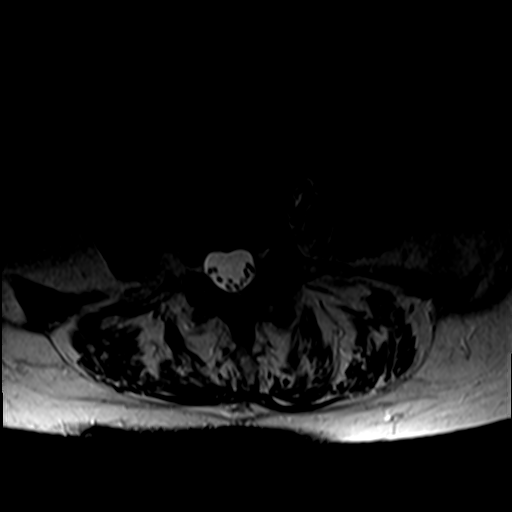
[im 19/41]
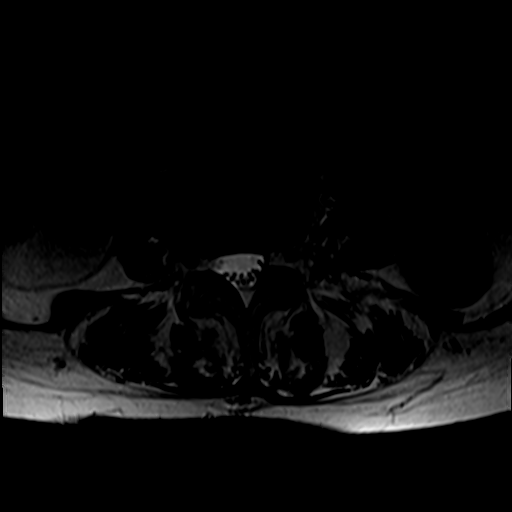
[im 22/41]
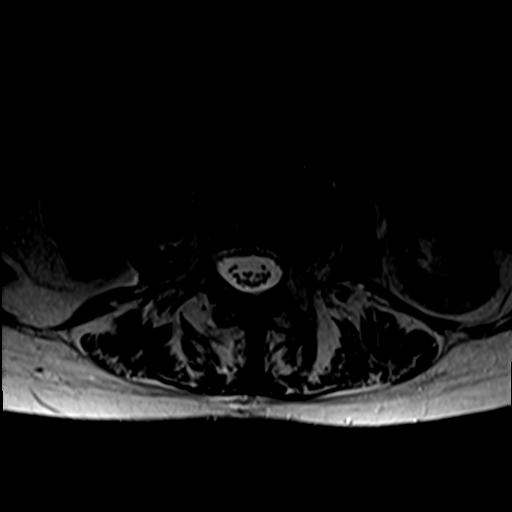
[im 28/41]
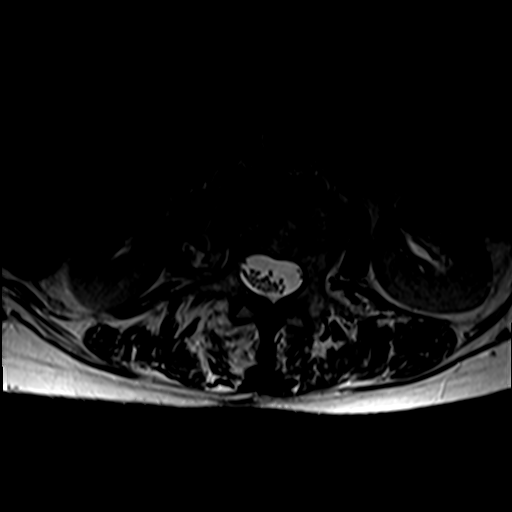
[im 34/41]
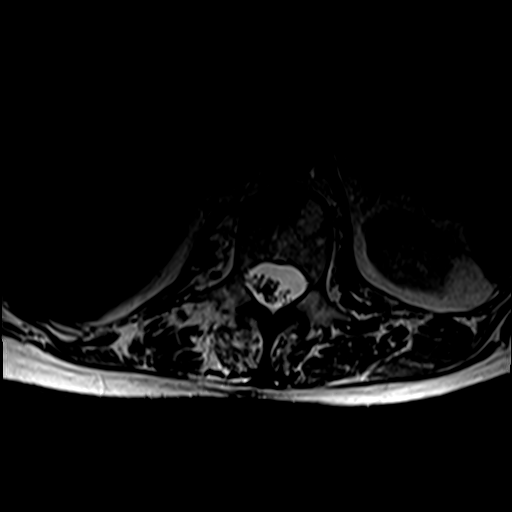
[im 41/41]
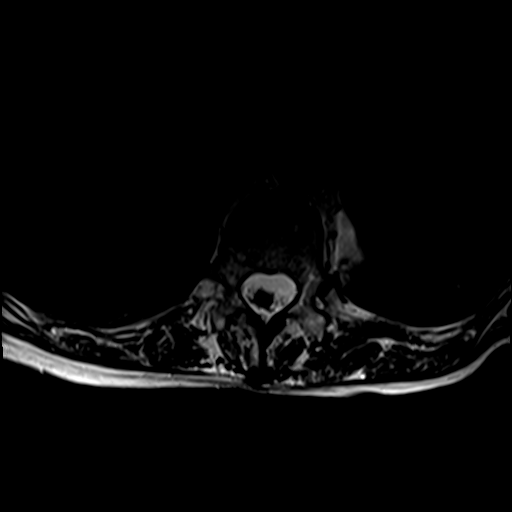

[Series 7: T1 · axial · 4.0mm · 0.39mm/px · z∈[-19,+167]mm · 4 of 41 slices shown (2 of 2)]
[im 1/41]
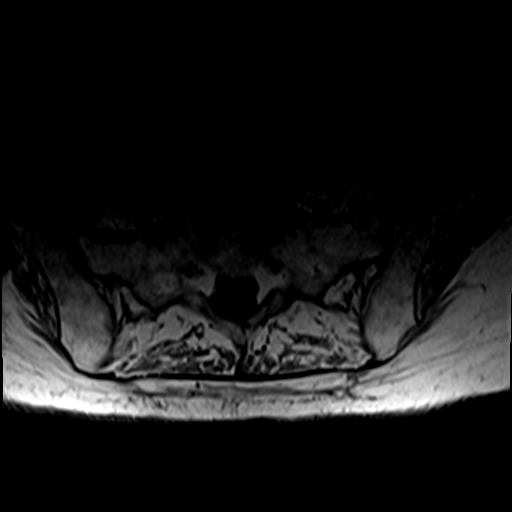
[im 7/41]
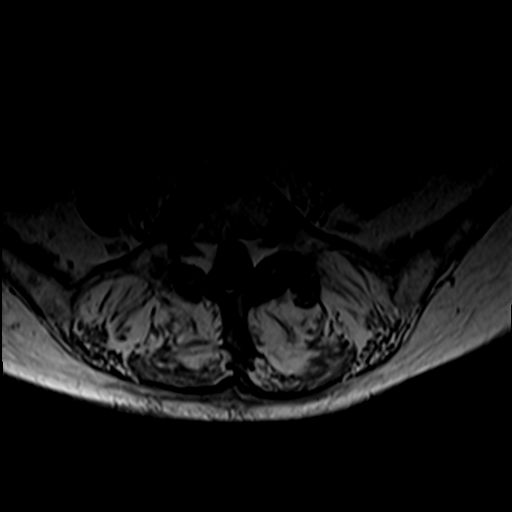
[im 22/41]
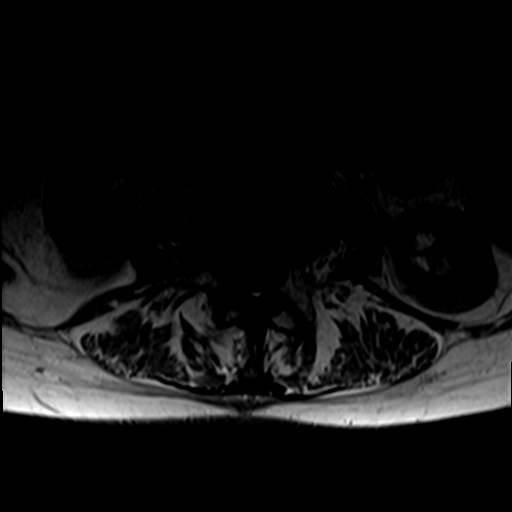
[im 34/41]
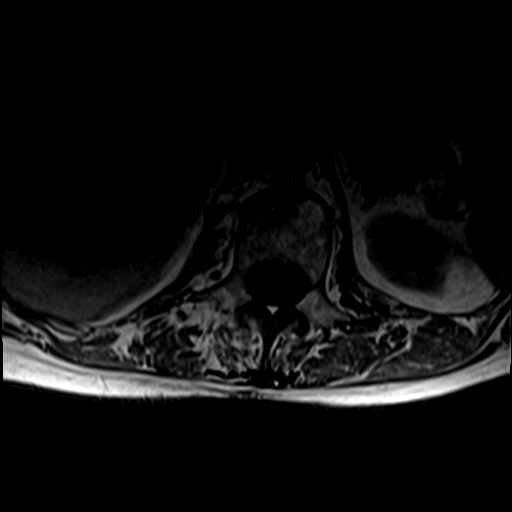

[25 of 48 positions shown; findings below may reference images not displayed]

FINDINGS: Segmentation: 5 lumbar vertebrae. The caudal most well-formed
intervertebral disc space is designated L5-S1.

Alignment: Thoracolumbar levocurvature. 5 mm bony retropulsion at
the level of the L2 inferior endplate. 4 mm bony retropulsion at the
level of the L3 superior endplate. No significant spondylolisthesis.

Vertebrae: Redemonstrated L2 inferior endplate compression fracture
(with up to 30% height loss). Redemonstrated L3 superior endplate
compression fracture (with up to 20% height loss). There is edema
along the L2 inferior endplate and edema throughout much of the L3
vertebral body, extending into the right greater than left L3
posterior elements, and these fractures are likely acute/subacute.
Vertebral body height is otherwise maintained. Mild degenerative
appearing edema along the endplates at L1-L2. There is edema within
the sacrum at midline at the S3 level. Additionally, there is
incompletely imaged edema within the right sacral ala, and these
findings are suspicious for acute/early subacute sacral fractures at
these sites.

Conus medullaris and cauda equina: Conus extends to the L1-L2 level.
No signal abnormality within the visualized distal spinal cord.

Paraspinal and other soft tissues: Right extrarenal pelvis. No
abnormality identified within included portions of the
abdomen/retroperitoneum. Atrophy of the lumbar paraspinal
musculature.

Disc levels:

Multilevel disc degeneration. Most notably, there is moderate to
moderately advanced disc degeneration at L2-L3 most notably,
moderate to moderately advanced disc degeneration is present at
L2-L3.

T10-T11: This level is imaged in the sagittal plane only. Slight
disc bulge. Mild facet arthrosis. No significant disc herniation or
stenosis.

T11-T12: This level is imaged in the sagittal plane only. Slight
disc bulge. Mild facet arthrosis. No significant spinal canal or
foraminal stenosis.

T12-L1: Small disc bulge. Mild facet arthrosis. No significant
spinal canal or foraminal stenosis.

L1-L2: Disc bulge. Endplate spurring/osteophytic ridging along the
right aspect of the disc space. Mild facet arthrosis and ligamentum
flavum hypertrophy. Minimal partial effacement of the ventral thecal
sac without significant central canal stenosis. Minimal relative
right neural foraminal narrowing.

L2-L3: Disc bulge. Endplate spurring/osteophytic ridging along the
right aspect of the disc space. 5 mm bony retropulsion at the level
of the L2 inferior endplate. Mild facet arthrosis/ligamentum flavum
hypertrophy. Mild relative bilateral subarticular and central canal
narrowing (without nerve root impingement). Mild right neural
foraminal narrowing.

L3-L4: Disc bulge asymmetric to the left. Mild facet and ligamentum
flavum hypertrophy. The disc bulge results in left subarticular
narrowing, contacting and posteriorly displacing the descending left
L4 nerve root (series 6, image 24). No significant central canal
stenosis or neural foraminal narrowing.

L4-L5: Disc bulge asymmetric to the left. Ligamentum flavum
hypertrophy. Mild left subarticular narrowing with slight crowding
of the descending left L5 nerve root (series 6, image 31). Central
canal patent. No significant foraminal stenosis.

L5-S1: Facet arthrosis (mild right, mild to moderate left). Trace
fluid within the left facet joint. Minimal ligamentum flavum
hypertrophy. No significant disc herniation or stenosis.

Impressions #1 and #2 will be called to the ordering clinician or
representative by the Radiologist Assistant, and communication
documented in the PACS or [REDACTED].
IMPRESSION: L2 and L3 vertebral compression fractures with unchanged height loss
as compared to the prior lumbar spine MRI of [DATE] (up to 30%).
There is significant marrow edema associated with these fractures,
and these fractures are likely acute/subacute. Mild bony
retropulsion at the L2 and L3 levels (with superimposed spondylosis)
resulting in mild L2-L3 spinal canal narrowing.

Incompletely imaged marrow edema within the sacrum at the S3 level
and within the right sacral ala, suspicious for additional
acute/early subacute fractures. Consider a dedicated CT or MRI of
the sacrum/pelvis for further evaluation.

Additional lumbar spondylosis, as described. No significant central
canal stenosis at the remaining levels. Additional sites of
subarticular stenosis, most notably on the left at L3-L4 where an
asymmetric disc bulge contacts and posteriorly displaces the
descending left L4 nerve root.

Sites of mild neural foraminal narrowing, as detailed.

Thoracolumbar levocurvature.

## 2021-11-21 NOTE — Progress Notes (Signed)
? ?Office Visit Note ?  ?Patient: Linda [name]           ?Date of Birth: 10/11/1923           ?MRN: [id number] ?Visit Date: 11/15/2021 ?             ?Requested by: Vivi Barrack, MD ?[address] ?[place],  [place] [address] ?PCP: Vivi Barrack, MD ? ? ?Assessment & Plan: ?Visit Diagnoses:  ?1. Acute midline low back pain, unspecified whether sciatica present   ? ? ?Plan: X-rays reviewed.  Patient has compression fractures L2 and L3 which are subacute and should continue to heal.  No progression by MRI scan/ 11/18/20 versus 11/16/2018 3 plain radiographs.  We discussed gradual healing of the compression fracture with resolution of symptoms.  She has some scoliosis. ? ?Follow-Up Instructions: No follow-ups on file.  ? ?Orders:  ?Orders Placed This Encounter  ?Procedures  ? XR Lumbar Spine 2-3 Views  ? MR Lumbar Spine w/o contrast  ? ?No orders of the defined types were placed in this encounter. ? ? ? ? Procedures: ?No procedures performed ? ? ?Clinical Data: ?No additional findings. ? ? ?Subjective: ?Chief Complaint  ?Patient presents with  ? Lower Back - Fracture  ? ? ?HPI 88 year old female with mid to lower back pain.  Patient set [facility] had x-rays done by the PA and was told she had compression fracture at L2 and L3 approximately 50%.  Patient states does not recall a fall.  She is amatory with a rolling walker with reverse seat.  Past history of CVA January 31.  Patient has decreased balance.  Patient's daughter is with her and states she is very concerned about the pain that patient continues to have.  She was given oxycodone 2.5/300 as needed for pain which tends to help. ? ?Below is MRI result which was obtained on 11/18/2021 3 days after this visit. ? ?Review of Systems history of osteoporosis hypercalcemia hyperlipidemia hyperparathyroidism hypertension COPD PAT. ? ? ?Objective: ?Vital Signs: BP (!) 159/68   Pulse 69   Ht 5\' 5"  (1.651 m)   Wt 124 lb (56.2 kg)   BMI 20.63 kg/m?  ? ?Physical  Exam ?Constitutional:   ?   Appearance: She is well-developed.  ?HENT:  ?   Head: Normocephalic.  ?   Right Ear: External ear normal.  ?   Left Ear: External ear normal. There is no impacted cerumen.  ?Eyes:  ?   Pupils: Pupils are equal, round, and reactive to light.  ?Neck:  ?   Thyroid: No thyromegaly.  ?   Trachea: No tracheal deviation.  ?Cardiovascular:  ?   Rate and Rhythm: Normal rate.  ?Pulmonary:  ?   Effort: Pulmonary effort is normal.  ?Abdominal:  ?   Palpations: Abdomen is soft.  ?Musculoskeletal:  ?   Cervical back: No rigidity.  ?Skin: ?   General: Skin is warm and dry.  ?Neurological:  ?   Mental Status: She is alert and oriented to person, place, and time.  ?Psychiatric:     ?   Behavior: Behavior normal.  ? ? ?Ortho Exam patient has some tenderness mid lumbar spine mild sciatic notch tenderness negative logroll the hips.  Good capillary refill. ? ?Specialty Comments:  ?No specialty comments available. ? ?Imaging: ?Narrative & Impression  ?CLINICAL DATA:  Provided history: Acute midline low back pain, ?unspecified whether sciatica present. Compression fracture, lumbar; ?compression fracture, subacute L2, L3. Additional history provided ?by scanning technologist:  Patient reports compression fracture ?diagnosed 2 weeks ago. ?  ?EXAM: ?MRI LUMBAR SPINE WITHOUT CONTRAST ?  ?TECHNIQUE: ?Multiplanar, multisequence MR imaging of the lumbar spine was ?performed. No intravenous contrast was administered. ?  ?COMPARISON:  Lumbar spine radiographs 11/15/2021. Lumbar spine ?radiographs 10/09/2018. ?  ?FINDINGS: ?Segmentation: 5 lumbar vertebrae. The caudal most well-formed ?intervertebral disc space is designated L5-S1. ?  ?Alignment: Thoracolumbar levocurvature. 5 mm bony retropulsion at ?the level of the L2 inferior endplate. 4 mm bony retropulsion at the ?level of the L3 superior endplate. No significant spondylolisthesis. ?  ?Vertebrae: Redemonstrated L2 inferior endplate compression fracture ?(with up to  30% height loss). Redemonstrated L3 superior endplate ?compression fracture (with up to 20% height loss). There is edema ?along the L2 inferior endplate and edema throughout much of the L3 ?vertebral body, extending into the right greater than left L3 ?posterior elements, and these fractures are likely acute/subacute. ?Vertebral body height is otherwise maintained. Mild degenerative ?appearing edema along the endplates at 075-GRM. There is edema within ?the sacrum at midline at the S3 level. Additionally, there is ?incompletely imaged edema within the right sacral ala, and these ?findings are suspicious for acute/early subacute sacral fractures at ?these sites. ?  ?Conus medullaris and cauda equina: Conus extends to the L1-L2 level. ?No signal abnormality within the visualized distal spinal cord. ?  ?Paraspinal and other soft tissues: Right extrarenal pelvis. No ?abnormality identified within included portions of the ?abdomen/retroperitoneum. Atrophy of the lumbar paraspinal ?musculature. ?  ?Disc levels: ?  ?Multilevel disc degeneration. Most notably, there is moderate to ?moderately advanced disc degeneration at L2-L3 most notably, ?moderate to moderately advanced disc degeneration is present at ?L2-L3. ?  ?T10-T11: This level is imaged in the sagittal plane only. Slight ?disc bulge. Mild facet arthrosis. No significant disc herniation or ?stenosis. ?  ?T11-T12: This level is imaged in the sagittal plane only. Slight ?disc bulge. Mild facet arthrosis. No significant spinal canal or ?foraminal stenosis. ?  ?T12-L1: Small disc bulge. Mild facet arthrosis. No significant ?spinal canal or foraminal stenosis. ?  ?L1-L2: Disc bulge. Endplate spurring/osteophytic ridging along the ?right aspect of the disc space. Mild facet arthrosis and ligamentum ?flavum hypertrophy. Minimal partial effacement of the ventral thecal ?sac without significant central canal stenosis. Minimal relative ?right neural foraminal narrowing. ?   ?L2-L3: Disc bulge. Endplate spurring/osteophytic ridging along the ?right aspect of the disc space. 5 mm bony retropulsion at the level ?of the L2 inferior endplate. Mild facet arthrosis/ligamentum flavum ?hypertrophy. Mild relative bilateral subarticular and central canal ?narrowing (without nerve root impingement). Mild right neural ?foraminal narrowing. ?  ?L3-L4: Disc bulge asymmetric to the left. Mild facet and ligamentum ?flavum hypertrophy. The disc bulge results in left subarticular ?narrowing, contacting and posteriorly displacing the descending left ?L4 nerve root (series 6, image 24). No significant central canal ?stenosis or neural foraminal narrowing. ?  ?L4-L5: Disc bulge asymmetric to the left. Ligamentum flavum ?hypertrophy. Mild left subarticular narrowing with slight crowding ?of the descending left L5 nerve root (series 6, image 31). Central ?canal patent. No significant foraminal stenosis. ?  ?L5-S1: Facet arthrosis (mild right, mild to moderate left). Trace ?fluid within the left facet joint. Minimal ligamentum flavum ?hypertrophy. No significant disc herniation or stenosis. ?  ?Impressions #1 and #2 will be called to the ordering clinician or ?representative by the Radiologist Assistant, and communication ?documented in the PACS or [employer]. ?  ?IMPRESSION: ?L2 and L3 vertebral compression fractures with unchanged height loss ?as compared to  the prior lumbar spine MRI of 11/15/2021 (up to 30%). ?There is significant marrow edema associated with these fractures, ?and these fractures are likely acute/subacute. Mild bony ?retropulsion at the L2 and L3 levels (with superimposed spondylosis) ?resulting in mild L2-L3 spinal canal narrowing. ?  ?Incompletely imaged marrow edema within the sacrum at the S3 level ?and within the right sacral ala, suspicious for additional ?acute/early subacute fractures. Consider a dedicated CT or MRI of ?the sacrum/pelvis for further evaluation. ?   ?Additional lumbar spondylosis, as described. No significant central ?canal stenosis at the remaining levels. Additional sites of ?subarticular stenosis, most notably on the left at L3-L4 where an ?asymmetric disc bulge contacts and

## 2021-11-29 ENCOUNTER — Encounter: Payer: Self-pay | Admitting: Orthopaedic Surgery

## 2021-11-29 ENCOUNTER — Ambulatory Visit (INDEPENDENT_AMBULATORY_CARE_PROVIDER_SITE_OTHER): Payer: Medicare Other | Admitting: Orthopaedic Surgery

## 2021-11-29 ENCOUNTER — Ambulatory Visit (INDEPENDENT_AMBULATORY_CARE_PROVIDER_SITE_OTHER): Payer: Medicare Other

## 2021-11-29 VITALS — Ht 65.0 in | Wt 124.0 lb

## 2021-11-29 DIAGNOSIS — I63412 Cerebral infarction due to embolism of left middle cerebral artery: Secondary | ICD-10-CM | POA: Diagnosis not present

## 2021-11-29 DIAGNOSIS — M545 Low back pain, unspecified: Secondary | ICD-10-CM | POA: Diagnosis not present

## 2021-11-30 NOTE — Progress Notes (Signed)
? ?Office Visit Note ?  ?Patient: Linda [name]           ?Date of Birth: 06-Aug-1924           ?MRN: [id number] ?Visit Date: 11/29/2021 ?             ?Requested by: Ardith Dark, MD ?[phone] ?[place],  [place] [address] ?PCP: Ardith Dark, MD ? ? ?Assessment & Plan: ?Visit Diagnoses:  ?1. Acute midline low back pain, unspecified whether sciatica present   ? ? ?Plan: We reviewed the MRI scan which shows 50% compression L2 and L3.  She ambulates with a rolling walker and we discussed that this should gradually improve she would be better if she used folding walker with 5 inch wheels in the frontTo decrease fall risk. and stops  or tennis balls in the back. ? ?Follow-Up Instructions: No follow-ups on file.  ? ?Orders:  ?Orders Placed This Encounter  ?Procedures  ? XR Lumbar Spine 2-3 Views  ? ?No orders of the defined types were placed in this encounter. ? ? ? ? Procedures: ?No procedures performed ? ? ?Clinical Data: ?No additional findings. ? ? ?Subjective: ?Chief Complaint  ?Patient presents with  ? Lower Back - Pain, Follow-up  ?  MRI lumbar review  ? ? ?HPI 88 year old female returns for follow-up of compression fractures L2 and L3 approximately 50%.  Patient states she has continued to have significant pain.  She is using some oxycodone and also Tylenol.  Lidocaine patch at night.  She states of the facility is not really giving her a lot of pain medication.  Lumbar MRI scan is available and is reviewed with patient today.  Patient's daughter is also present with her. ? ?Review of Systems all other systems updated unchanged from 11/15/2021 office visit other than as mentioned above. ? ? ?Objective: ?Vital Signs: Ht 5\' 5"  (1.651 m)   Wt 124 lb (56.2 kg)   BMI 20.63 kg/m?  ? ?Physical Exam ?Constitutional:   ?   Appearance: She is well-developed.  ?HENT:  ?   Head: Normocephalic.  ?   Right Ear: External ear normal.  ?   Left Ear: External ear normal. There is no impacted cerumen.  ?Eyes:  ?   Pupils: Pupils  are equal, round, and reactive to light.  ?Neck:  ?   Thyroid: No thyromegaly.  ?   Trachea: No tracheal deviation.  ?Cardiovascular:  ?   Rate and Rhythm: Normal rate.  ?Pulmonary:  ?   Effort: Pulmonary effort is normal.  ?Abdominal:  ?   Palpations: Abdomen is soft.  ?Musculoskeletal:  ?   Cervical back: No rigidity.  ?Skin: ?   General: Skin is warm and dry.  ?Neurological:  ?   Mental Status: She is alert and oriented to person, place, and time.  ?Psychiatric:     ?   Behavior: Behavior normal.  ? ? ?Ortho Exam patient has some pain with hip flexion knee extension.  Sensation is intact no sensory deficit. ? ?Specialty Comments:  ?No specialty comments available. ? ?Imaging: ?CLINICAL DATA:  Provided history: Acute midline low back pain, ?unspecified whether sciatica present. Compression fracture, lumbar; ?compression fracture, subacute L2, L3. Additional history provided ?by scanning technologist: Patient reports compression fracture ?diagnosed 2 weeks ago. ?  ?EXAM: ?MRI LUMBAR SPINE WITHOUT CONTRAST ?  ?TECHNIQUE: ?Multiplanar, multisequence MR imaging of the lumbar spine was ?performed. No intravenous contrast was administered. ?  ?COMPARISON:  Lumbar spine radiographs 11/15/2021.  Lumbar spine ?radiographs 10/09/2018. ?  ?FINDINGS: ?Segmentation: 5 lumbar vertebrae. The caudal most well-formed ?intervertebral disc space is designated L5-S1. ?  ?Alignment: Thoracolumbar levocurvature. 5 mm bony retropulsion at ?the level of the L2 inferior endplate. 4 mm bony retropulsion at the ?level of the L3 superior endplate. No significant spondylolisthesis. ?  ?Vertebrae: Redemonstrated L2 inferior endplate compression fracture ?(with up to 30% height loss). Redemonstrated L3 superior endplate ?compression fracture (with up to 20% height loss). There is edema ?along the L2 inferior endplate and edema throughout much of the L3 ?vertebral body, extending into the right greater than left L3 ?posterior elements, and these  fractures are likely acute/subacute. ?Vertebral body height is otherwise maintained. Mild degenerative ?appearing edema along the endplates at L1-L2. There is edema within ?the sacrum at midline at the S3 level. Additionally, there is ?incompletely imaged edema within the right sacral ala, and these ?findings are suspicious for acute/early subacute sacral fractures at ?these sites. ?  ?Conus medullaris and cauda equina: Conus extends to the L1-L2 level. ?No signal abnormality within the visualized distal spinal cord. ?  ?Paraspinal and other soft tissues: Right extrarenal pelvis. No ?abnormality identified within included portions of the ?abdomen/retroperitoneum. Atrophy of the lumbar paraspinal ?musculature. ?  ?Disc levels: ?  ?Multilevel disc degeneration. Most notably, there is moderate to ?moderately advanced disc degeneration at L2-L3 most notably, ?moderate to moderately advanced disc degeneration is present at ?L2-L3. ?  ?T10-T11: This level is imaged in the sagittal plane only. Slight ?disc bulge. Mild facet arthrosis. No significant disc herniation or ?stenosis. ?  ?T11-T12: This level is imaged in the sagittal plane only. Slight ?disc bulge. Mild facet arthrosis. No significant spinal canal or ?foraminal stenosis. ?  ?T12-L1: Small disc bulge. Mild facet arthrosis. No significant ?spinal canal or foraminal stenosis. ?  ?L1-L2: Disc bulge. Endplate spurring/osteophytic ridging along the ?right aspect of the disc space. Mild facet arthrosis and ligamentum ?flavum hypertrophy. Minimal partial effacement of the ventral thecal ?sac without significant central canal stenosis. Minimal relative ?right neural foraminal narrowing. ?  ?L2-L3: Disc bulge. Endplate spurring/osteophytic ridging along the ?right aspect of the disc space. 5 mm bony retropulsion at the level ?of the L2 inferior endplate. Mild facet arthrosis/ligamentum flavum ?hypertrophy. Mild relative bilateral subarticular and central canal ?narrowing  (without nerve root impingement). Mild right neural ?foraminal narrowing. ?  ?L3-L4: Disc bulge asymmetric to the left. Mild facet and ligamentum ?flavum hypertrophy. The disc bulge results in left subarticular ?narrowing, contacting and posteriorly displacing the descending left ?L4 nerve root (series 6, image 24). No significant central canal ?stenosis or neural foraminal narrowing. ?  ?L4-L5: Disc bulge asymmetric to the left. Ligamentum flavum ?hypertrophy. Mild left subarticular narrowing with slight crowding ?of the descending left L5 nerve root (series 6, image 31). Central ?canal patent. No significant foraminal stenosis. ?  ?L5-S1: Facet arthrosis (mild right, mild to moderate left). Trace ?fluid within the left facet joint. Minimal ligamentum flavum ?hypertrophy. No significant disc herniation or stenosis. ?  ?Impressions #1 and #2 will be called to the ordering clinician or ?representative by the Radiologist Assistant, and communication ?documented in the PACS or [employer]. ?  ?IMPRESSION: ?L2 and L3 vertebral compression fractures with unchanged height loss ?as compared to the prior lumbar spine MRI of 11/15/2021 (up to 30%). ?There is significant marrow edema associated with these fractures, ?and these fractures are likely acute/subacute. Mild bony ?retropulsion at the L2 and L3 levels (with superimposed spondylosis) ?resulting in mild L2-L3 spinal  canal narrowing. ?  ?Incompletely imaged marrow edema within the sacrum at the S3 level ?and within the right sacral ala, suspicious for additional ?acute/early subacute fractures. Consider a dedicated CT or MRI of ?the sacrum/pelvis for further evaluation. ?  ?Additional lumbar spondylosis, as described. No significant central ?canal stenosis at the remaining levels. Additional sites of ?subarticular stenosis, most notably on the left at L3-L4 where an ?asymmetric disc bulge contacts and posteriorly displaces the ?descending left L4 nerve root. ?   ?Sites of mild neural foraminal narrowing, as detailed. ?  ?Thoracolumbar levocurvature. ?  ?  ?Electronically Signed ?  By: Jackey LogeKyle  Golden D.O. ?  On: 11/18/2021 12:50 ?  ? ? ?PMFS History: ?Patient Active

## 2021-12-05 ENCOUNTER — Ambulatory Visit: Payer: Medicare Other | Admitting: Neurology

## 2021-12-12 ENCOUNTER — Encounter (HOSPITAL_BASED_OUTPATIENT_CLINIC_OR_DEPARTMENT_OTHER): Payer: Self-pay

## 2021-12-12 DIAGNOSIS — I10 Essential (primary) hypertension: Secondary | ICD-10-CM

## 2021-12-12 MED ORDER — AMLODIPINE BESYLATE 2.5 MG PO TABS: 2.5000 mg | ORAL_TABLET | Freq: Every day | ORAL | 3 refills | Status: DC

## 2021-12-20 ENCOUNTER — Ambulatory Visit: Payer: Medicare Other | Admitting: Family Medicine

## 2022-01-24 ENCOUNTER — Ambulatory Visit (INDEPENDENT_AMBULATORY_CARE_PROVIDER_SITE_OTHER): Payer: Medicare Other | Admitting: Orthopaedic Surgery

## 2022-01-24 ENCOUNTER — Ambulatory Visit (INDEPENDENT_AMBULATORY_CARE_PROVIDER_SITE_OTHER): Payer: Medicare Other

## 2022-01-24 VITALS — BP 164/70 | HR 72

## 2022-01-24 DIAGNOSIS — M545 Low back pain, unspecified: Secondary | ICD-10-CM

## 2022-01-24 DIAGNOSIS — M5459 Other low back pain: Secondary | ICD-10-CM | POA: Diagnosis not present

## 2022-01-24 DIAGNOSIS — S32000G Wedge compression fracture of unspecified lumbar vertebra, subsequent encounter for fracture with delayed healing: Secondary | ICD-10-CM | POA: Diagnosis not present

## 2022-01-24 DIAGNOSIS — I63412 Cerebral infarction due to embolism of left middle cerebral artery: Secondary | ICD-10-CM | POA: Diagnosis not present

## 2022-01-24 NOTE — Progress Notes (Unsigned)
Office Visit Note   Patient: Linda [name]           Date of Birth: 1924-05-23           MRN: [id number] Visit Date: 01/24/2022              Requested by: Vivi Barrack, MD [address] [place],  [place] [address] PCP: Vivi Barrack, MD   Assessment & Plan: Visit Diagnoses:  1. Acute midline low back pain, unspecified whether sciatica present   2. Compression fracture of lumbar vertebra with delayed healing, unspecified lumbar vertebral level, subsequent encounter     Plan: We offered her referral for vertebroplasty at L3 she can decide on that she understands that with the healing of the fracture she should get pain relief.  We discussed using a small pillow in the lumbar region.  Fall prevention discussed.  Follow-Up Instructions: Return in about 2 months (around 03/26/2022).   Orders:  Orders Placed This Encounter  Procedures   XR Lumbar Spine 2-3 Views   No orders of the defined types were placed in this encounter.     Procedures: No procedures performed   Clinical Data: No additional findings.   Subjective: Chief Complaint  Patient presents with   Lower Back - Follow-up    HPI 88 year old female returns with ongoing problems with back pain.  She has some compression inferior aspect of L2 and superior aspect of L3 previously 50% and new x-rays show further compression another 25% at L3.  She is using plain Tylenol occasionally quarter or half tablet of oxycodone.  Patient's daughter is present with her today.  She is using a rolling walker has not fallen since she has been using the walker.  No associated bowel or bladder symptoms.  She complains about the short distance between the anterior pelvis and her ribs.  Review of Systems updated unchanged from 11/15/2021.   Objective: Vital Signs: BP (!) 164/70   Pulse 72   Physical Exam Constitutional:      Appearance: She is well-developed.  HENT:     Head: Normocephalic.     Right Ear: External ear normal.      Left Ear: External ear normal. There is no impacted cerumen.  Eyes:     Pupils: Pupils are equal, round, and reactive to light.  Neck:     Thyroid: No thyromegaly.     Trachea: No tracheal deviation.  Cardiovascular:     Rate and Rhythm: Normal rate.  Pulmonary:     Effort: Pulmonary effort is normal.  Abdominal:     Palpations: Abdomen is soft.  Musculoskeletal:     Cervical back: No rigidity.  Skin:    General: Skin is warm and dry.  Neurological:     Mental Status: She is alert and oriented to person, place, and time.  Psychiatric:        Behavior: Behavior normal.     Ortho Exam neurologically intact lower extremities.  Some thoracolumbar kyphosis.  Specialty Comments:  No specialty comments available.  Imaging: No results found.   PMFS History: Patient Active Problem List   Diagnosis Date Noted   Lumbar compression fracture ([facility]) 01/25/2022   Cerebral thrombosis with cerebral infarction 09/22/2021   Hypokalemia 09/21/2021   Chronic diastolic CHF (congestive heart failure) ([facility]) 09/21/2021   COPD (chronic obstructive pulmonary disease) ([facility]) 09/21/2021   Altered mental status 09/19/2021   Osteoarthritis 08/12/2021   Dermatitis 04/13/2021   Multinodular goiter 11/19/2020   Hypercalcemia  11/19/2020   Hyperparathyroidism ([facility]) 11/19/2020   Bronchiectasis without acute exacerbation ([place]) 07/14/2019   Chronic cough 02/03/2019   Thoracic back pain 10/13/2018   Hyperlipidemia 10/13/2018   Vitamin D deficiency 10/13/2018   Gastroesophageal reflux disease without esophagitis 10/13/2018   Chronic midline thoracic back pain 10/13/2018   Osteoporosis without current pathological fracture 10/13/2018   Essential hypertension 10/13/2018   Stenosis of carotid artery 10/13/2018   History of thyroid nodule, s/p bx, benign 10/13/2018   Aortic cusp regurgitation 10/13/2018   Nonrheumatic tricuspid valve regurgitation 10/13/2018   Coccyalgia 10/13/2018   Irritable  bowel syndrome 10/13/2018   Bilateral hearing loss 10/13/2018   Chronic rhinitis 10/13/2018   Paroxysmal atrial tachycardia ([facility]) 10/13/2018   Past Medical History:  Diagnosis Date   Arthritis    COPD (chronic obstructive pulmonary disease) (HCC)    chronic cough   Glaucoma    Hyperlipidemia    Hypertension    Irregular heartbeat    Osteoporosis    Thyroid disease    enlarged thyroid    Family History  Problem Relation Age of Onset   Stroke Mother    Heart disease Mother    Heart disease Father    Heart disease Brother    Heart disease Brother    Colon cancer Neg Hx    Colon polyps Neg Hx    Esophageal cancer Neg Hx     Past Surgical History:  Procedure Laterality Date   APPENDECTOMY     cateract surgery     COLONOSCOPY     Done in [place]  yrs ago   UPPER GASTROINTESTINAL ENDOSCOPY  2019   WRIST SURGERY     Social History   Occupational History   Occupation: Retired  Tobacco Use   Smoking status: Former    Packs/day: 0.25    Years: 2.00    Total pack years: 0.50    Types: Cigarettes    Start date: 1950    Quit date: 1952    Years since quitting: 71.4    Passive exposure: Past   Smokeless tobacco: Never  Vaping Use   Vaping Use: Never used  Substance and Sexual Activity   Alcohol use: Not Currently   Drug use: Never   Sexual activity: Not on file

## 2022-01-25 DIAGNOSIS — S32000A Wedge compression fracture of unspecified lumbar vertebra, initial encounter for closed fracture: Secondary | ICD-10-CM | POA: Insufficient documentation

## 2022-01-31 ENCOUNTER — Encounter: Payer: Self-pay | Admitting: Internal Medicine

## 2022-01-31 ENCOUNTER — Ambulatory Visit (INDEPENDENT_AMBULATORY_CARE_PROVIDER_SITE_OTHER): Payer: Medicare Other | Admitting: Internal Medicine

## 2022-01-31 VITALS — BP 126/72 | HR 61 | Temp 98.1°F | Ht 65.0 in | Wt 116.4 lb

## 2022-01-31 DIAGNOSIS — J479 Bronchiectasis, uncomplicated: Secondary | ICD-10-CM | POA: Diagnosis not present

## 2022-01-31 DIAGNOSIS — A310 Pulmonary mycobacterial infection: Secondary | ICD-10-CM | POA: Diagnosis not present

## 2022-01-31 NOTE — Patient Instructions (Addendum)
Please schedule follow up scheduled with myself in 1 year.  If my schedule is not open yet, we will contact you with a reminder closer to that time. Please call [phone] if you haven't heard from [place] a month before.   Let me know if you develop issues with your breathing or if coughing worsens. I can always prescribe a nebulizer machine with saline breathing treatments and a flutter valve to help you cough up.

## 2022-01-31 NOTE — Progress Notes (Signed)
Linda [name]    [id number]    [birth year]  Primary Care Physician:Parker, Algis Greenhouse, MD Date of Appointment: 01/31/2022 Established Patient Visit  Chief complaint:   Chief Complaint  Patient presents with   Follow-up    Increased cough      HPI: Linda [name] is a 88 y.o. woman with history of bronchiectasis and MAI.   Interval Updates: Here for follow up for chronic cough related to bronchiectasis.  Had a stroke in Jan 2023. Was hospitalized for a few days, rehab and now assisted living. She is feeling depressed about this.  She was treated with azithromycin in the hospital.  Now she feels the cough is coming back.   Denies dyspnea or wheezing.   Daughter notes she has lost some weight since the stroke. Appetite is ok.   She has various issues including back pain from compression fractures.  Denies overt aspiration but does feel some bloating and stomach pressure.  She wears a panty girdle and has for many years - as long as daughter can remember. She doesn't want to stop wearing this because then she feels like her stomach will fall off.   I have reviewed the patient's family social and past medical history and updated as appropriate.   Past Medical History:  Diagnosis Date   Arthritis    COPD (chronic obstructive pulmonary disease) (HCC)    chronic cough   Glaucoma    Hyperlipidemia    Hypertension    Irregular heartbeat    Osteoporosis    Thyroid disease    enlarged thyroid    Past Surgical History:  Procedure Laterality Date   APPENDECTOMY     cateract surgery     COLONOSCOPY     Done in [place]  yrs ago   UPPER GASTROINTESTINAL ENDOSCOPY  2019   WRIST SURGERY      Family History  Problem Relation Age of Onset   Stroke Mother    Heart disease Mother    Heart disease Father    Heart disease Brother    Heart disease Brother    Colon cancer Neg Hx    Colon polyps Neg Hx    Esophageal cancer Neg Hx     Social History   Occupational  History   Occupation: Retired  Tobacco Use   Smoking status: Former    Packs/day: 0.25    Years: 2.00    Total pack years: 0.50    Types: Cigarettes    Start date: 1950    Quit date: 1952    Years since quitting: 71.5    Passive exposure: Past   Smokeless tobacco: Never  Vaping Use   Vaping Use: Never used  Substance and Sexual Activity   Alcohol use: Not Currently   Drug use: Never   Sexual activity: Not on file     Physical Exam: Blood pressure 126/72, pulse 61, temperature 98.1 F (36.7 C), temperature source Oral, height _0  (1.651 m), weight 116 lb 6.4 oz (52.8 kg), SpO2 99 %.  Gen:      No acute distress, thin, no distress Lungs:    kyphosis, ctab no wheezes or crackles CV:         RRR no mrg   Data Reviewed: Imaging: I have personally reviewed the CT Chest October 2021 which shows bronchiectasis with nodule densities consistent with MAI infection.  PFTs:      Latest Ref Rng & Units 07/14/2019   10:54  AM  PFT Results  FVC-Pre L 1.83   FVC-Predicted Pre % 91   FVC-Post L 1.75   FVC-Predicted Post % 87   Pre FEV1/FVC % % 70   Post FEV1/FCV % % 78   FEV1-Pre L 1.29   FEV1-Predicted Pre % 88   FEV1-Post L 1.36   DLCO uncorrected ml/min/mmHg 12.92   TLC L 4.36   TLC % Predicted % 86   RV % Predicted % 99    I have personally reviewed the patient's PFTs and there is mild airflow limitation without BD response. Lung volumes normal, diffusion capacity is moderately reduced.   Labs:  Immunization status: Immunization History  Administered Date(s) Administered   Influenza, High Dose Seasonal PF 05/15/2019   Influenza-Unspecified 06/04/2018, 05/07/2020, 05/27/2021   PFIZER(Purple Top)SARS-COV-2 Vaccination 08/28/2019, 09/17/2019, 06/02/2020, 02/03/2021   PNEUMOCOCCAL CONJUGATE-20 10/19/2020   Zoster Recombinat (Shingrix) 04/02/2021, 09/11/2021    Assessment:  Chronic cough likely related to: Bronchiectasis without exacerbation Probable   Mycobacterium Avium Complex infection  Plan/Recommendations:  We discussed repeat CT Chest with bronchoscopy, sputum cultures, or a trial of albuterol prn or nebulized saline treatments with flutter valve. Sshe would like to continue to hold off all therapies for now.  Continue to monitor MAI conservatively off therapy.  She will let us know if symptoms change or progress.   I discussed these options with her and her daughter at great length again today.   I spent 30 minutes in the care of this patient today including pre-charting, chart review, review of results, face-to-face care, coordination of care and communication with consultants etc.).   Return to Care: Return in about 1 year (around 02/01/2023).   Lenice Llamas, MD Pulmonary and [place]

## 2022-02-22 ENCOUNTER — Ambulatory Visit (HOSPITAL_BASED_OUTPATIENT_CLINIC_OR_DEPARTMENT_OTHER): Payer: Medicare Other | Admitting: Cardiology

## 2022-03-06 ENCOUNTER — Encounter: Payer: Self-pay | Admitting: Family Medicine

## 2022-03-13 ENCOUNTER — Other Ambulatory Visit: Payer: Self-pay | Admitting: Family Medicine

## 2022-03-30 ENCOUNTER — Encounter (HOSPITAL_BASED_OUTPATIENT_CLINIC_OR_DEPARTMENT_OTHER): Payer: Self-pay | Admitting: Cardiology

## 2022-03-30 ENCOUNTER — Ambulatory Visit (INDEPENDENT_AMBULATORY_CARE_PROVIDER_SITE_OTHER): Payer: Medicare Other | Admitting: Cardiology

## 2022-03-30 VITALS — BP 110/68 | HR 72 | Ht 65.0 in | Wt 115.9 lb

## 2022-03-30 DIAGNOSIS — I10 Essential (primary) hypertension: Secondary | ICD-10-CM | POA: Diagnosis not present

## 2022-03-30 DIAGNOSIS — I471 Supraventricular tachycardia: Secondary | ICD-10-CM | POA: Diagnosis not present

## 2022-03-30 DIAGNOSIS — R011 Cardiac murmur, unspecified: Secondary | ICD-10-CM

## 2022-03-30 DIAGNOSIS — I63412 Cerebral infarction due to embolism of left middle cerebral artery: Secondary | ICD-10-CM

## 2022-03-30 DIAGNOSIS — Z8673 Personal history of transient ischemic attack (TIA), and cerebral infarction without residual deficits: Secondary | ICD-10-CM

## 2022-03-30 DIAGNOSIS — I361 Nonrheumatic tricuspid (valve) insufficiency: Secondary | ICD-10-CM

## 2022-03-30 NOTE — Patient Instructions (Signed)

## 2022-03-30 NOTE — Progress Notes (Signed)
Cardiology Office Note:    Date:  03/30/2022   ID:  [name], DOB Apr 06, 1924, MRN [id number]  PCP:  Vivi Barrack, MD  Cardiologist:  Buford Dresser, MD  Referring MD: Vivi Barrack, MD   CC: Follow-up   History of Present Illness:    Linda [name] is a 88 y.o. female with a hx of CVA, hypertension, hyperlipidemia, COPD, and arthritis, who is seen for follow-up. I initially met her 06/27/2021 as a new consult at the request of Vivi Barrack, MD for the evaluation and management of paroxysmal atrial tachycardia.  History: She was previously under the care of Dr. Alessandra Bevels, [facility], [facility], with cardiology (placed on sotalol in 2019). Her prior PCP was Dr. Roselee Culver, [place]. None of these records are available in [place].  She was admitted to the hospital 09/19/2021 with multiple symptoms concerning for stroke. She was found to have a left parietal infarct of unknown etiology. She was discharged on Plavix.   At her last appointment, her Plavix had been discontinued one week prior. We reviewed her hospitalization and recommendations. We spent extensive time discussing cardiac monitors, and that if afib was found, the recommendation is for long term anticoagulation. She was struggling severe, constant back pain believed to be due to a compression fracture. At a visit with her GI doctor her BP was 100/60. Her readings were fluctuating at other clinic visits. Most of her BP measurements were charted as 188-416 systolic.  Today: She is accompanied by a family member, who also provides the history.   She states she is feeling just okay today. She is suffering from chronic back pain, and she is constantly feeling cold. When outside in the heat she still sometimes needs to wear a sweater, but she was feeling hot while in the car earlier today. Also continues to have some residual memory loss from her prior stroke.  She will  stay active, walking through the hallways. She uses a walker for support. She has to walk down the halls to have her meals. She usually has a good appetite, but does not eat as much as she used to.   She is in assisted living. They report she is being weighed daily, monitoring for fluid retention.  She denies any palpitations, chest pain, or shortness of breath. No lightheadedness, headaches, syncope, orthopnea, or PND.   Past Medical History:  Diagnosis Date   Arthritis    COPD (chronic obstructive pulmonary disease) (HCC)    chronic cough   Glaucoma    Hyperlipidemia    Hypertension    Irregular heartbeat    Osteoporosis    Thyroid disease    enlarged thyroid    Past Surgical History:  Procedure Laterality Date   APPENDECTOMY     cateract surgery     COLONOSCOPY     Done in [place]  yrs ago   UPPER GASTROINTESTINAL ENDOSCOPY  2019   WRIST SURGERY      Current Medications: Current Outpatient Medications on File Prior to Visit  Medication Sig   acetaminophen (TYLENOL) 500 MG tablet Take 1,000 mg by mouth every 6 (six) hours as needed.   amLODipine (NORVASC) 2.5 MG tablet Take 1 tablet (2.5 mg total) by mouth daily.   aspirin EC 81 MG EC tablet Take 1 tablet (81 mg total) by mouth daily. Swallow whole.   Cholecalciferol (VITAMIN D3) 25 MCG (1000 UT) CAPS Take 1 capsule by mouth 2 (  two) times daily.   clobetasol cream (TEMOVATE) 3.82 % Apply 1 application topically as needed.   famotidine (PEPCID) 20 MG tablet Take 1 tablet (20 mg total) by mouth 2 (two) times daily.   INFANTS SIMETHICONE 20 MG/0.3ML drops 2 (two) times daily.   lidocaine (LIDODERM) 5 % Place 1 patch onto the skin daily.   mirtazapine (REMERON) 30 MG tablet Take 30 mg by mouth at bedtime.   oxycodone-acetaminophen (LYNOX) 2.5-300 MG per tablet Take 1 tablet by mouth every 4 (four) hours as needed for pain.   rosuvastatin (CRESTOR) 20 MG tablet TAKE 1 TABLET DAILY   sotalol (BETAPACE) 80 MG tablet Take 1 tablet  (80 mg total) by mouth every 12 (twelve) hours.   timolol (TIMOPTIC) 0.25 % ophthalmic solution Place 1 drop into both eyes daily.   No current facility-administered medications on file prior to visit.     Allergies:   Patient has no known allergies.   Social History   Tobacco Use   Smoking status: Former    Packs/day: 0.25    Years: 2.00    Total pack years: 0.50    Types: Cigarettes    Start date: 54    Quit date: 1952    Years since quitting: 71.6    Passive exposure: Past   Smokeless tobacco: Never  Vaping Use   Vaping Use: Never used  Substance Use Topics   Alcohol use: Not Currently   Drug use: Never    Family History: family history includes Heart disease in her brother, brother, father, and mother; Stroke in her mother. There is no history of Colon cancer, Colon polyps, or Esophageal cancer.  ROS:   Please see the history of present illness. (+) Chronic back pain (+) Residual memory loss (+) Persistent coldness All other systems are reviewed and negative.    EKGs/Labs/Other Studies Reviewed:    The following studies were reviewed today:  CTA Head/Neck  09/22/2021: IMPRESSION: 1. Limited study. The patient is kyphotic and not able to lie flat. Patient not able to hold still. There is considerable artifact on the study with decreased spatial resolution. 2. Restricted diffusion left parietal lobe seen on MRI not visualized by CT. No acute hemorrhage. 3. Negative for intracranial large vessel occlusion or flow limiting stenosis 4. 25% diameter stenosis proximal right internal carotid artery. Left carotid widely patent 5. Both vertebral arteries widely patent. 6. Goiter with substernal extension on the left.  Echo 09/20/2021:  1. Left ventricular ejection fraction, by estimation, is 70 to 75%. The  left ventricle has hyperdynamic function. The left ventricle has no  regional wall motion abnormalities. Left ventricular diastolic parameters  are consistent  with Grade II diastolic  dysfunction (pseudonormalization). Elevated left atrial pressure.   2. Right ventricular systolic function is normal. The right ventricular  size is normal. There is moderately elevated pulmonary artery systolic  pressure. The estimated right ventricular systolic pressure is 50.5 mmHg.   3. The mitral valve is normal in structure. Trivial mitral valve  regurgitation.   4. Tricuspid valve regurgitation is mild to moderate.   5. The aortic valve is tricuspid. There is mild calcification of the  aortic valve. Aortic valve regurgitation is mild. Aortic valve sclerosis  is present, with no evidence of aortic valve stenosis.   6. The inferior vena cava is normal in size with <50% respiratory  variability, suggesting right atrial pressure of 8 mmHg.   Comparison(s): Prior images reviewed side by side. The left ventricular  diastolic function is significantly worse. There is now evidence of  increased mean left atrial pressure, otherwise no significant change.  Carotid Arterial Duplex 10/04/2020: Summary:  Right Carotid: Velocities in the right ICA are consistent with a 1-39%  stenosis.   Left Carotid: Velocities in the left ICA are consistent with a 1-39%  stenosis.   Vertebrals: Bilateral vertebral arteries demonstrate antegrade flow.   Echo 09/30/2019:  1. Left ventricular ejection fraction, by estimation, is 60 to 65%. The  left ventricle has normal function. The left ventricle has no regional  wall motion abnormalities. Left ventricular diastolic parameters are  consistent with age-related delayed  relaxation (normal).   2. Right ventricular systolic function is normal. The right ventricular  size is normal. There is moderately elevated pulmonary artery systolic  pressure. The estimated right ventricular systolic pressure is 44.0 mmHg.   3. Left atrial size was mildly dilated.   4. Right atrial size was mildly dilated.   5. The mitral valve is degenerative.  Mild mitral valve regurgitation.   6. The tricuspid valve is myxomatous. Tricuspid valve regurgitation is  moderate to severe.   7. The aortic valve is tricuspid. Aortic valve regurgitation is mild.  Mild aortic valve sclerosis is present, with no evidence of aortic valve  stenosis.   8. The inferior vena cava is normal in size with <50% respiratory  variability, suggesting right atrial pressure of 8 mmHg.   EKG:  EKG is personally reviewed.   03/30/2022:  EKG was not ordered. 11/14/2021: EKG was not ordered. 06/27/2021: SR, 1st degree AV block, LVH at 64 bpm  Recent Labs: 09/15/2021: TSH 1.23 09/20/2021: ALT 17; B Natriuretic Peptide 255.2; Magnesium 1.8 09/22/2021: BUN 9; Creatinine, Ser 0.61; Hemoglobin 11.3; Platelets 213; Potassium 3.7; Sodium 134   Recent Lipid Panel    Component Value Date/Time   CHOL 116 09/20/2021 0338   TRIG 99 09/20/2021 0338   HDL 45 09/20/2021 0338   CHOLHDL 2.6 09/20/2021 0338   VLDL 20 09/20/2021 0338   LDLCALC 51 09/20/2021 0338   LDLCALC 77 06/23/2020 1027    Physical Exam:    VS:  BP 110/68 (BP Location: Right Arm, Patient Position: Sitting, Cuff Size: Normal)   Pulse 72   Ht _0  (1.651 m)   Wt 115 lb 14.4 oz (52.6 kg)   SpO2 96%   BMI 19.29 kg/m     Wt Readings from Last 3 Encounters:  03/30/22 115 lb 14.4 oz (52.6 kg)  01/31/22 116 lb 6.4 oz (52.8 kg)  11/29/21 124 lb (56.2 kg)    GEN: Well nourished, well developed in no acute distress HEENT: Normal, moist mucous membranes NECK: No JVD CARDIAC: regular rhythm, normal S1 and S2, no rubs or gallops. 2/6 systolic murmur at sternal border VASCULAR: Radial and DP pulses 2+ bilaterally. No carotid bruits RESPIRATORY:  Clear to auscultation without rales, wheezing or rhonchi ABDOMEN: Soft, non-tender, non-distended MUSCULOSKELETAL:  Ambulates independently SKIN: Warm and dry, very trivial bilateral LE edema without pitting NEUROLOGIC:  Alert and oriented x 3. No focal neuro deficits  noted. PSYCHIATRIC:  Normal affect    ASSESSMENT:    1. History of CVA (cerebrovascular accident)   2. Paroxysmal atrial tachycardia ([place])   3. Essential hypertension   4. Nonrheumatic tricuspid valve regurgitation   5. Murmur, cardiac    PLAN:    Prior CVA, concern for embolic source Paroxysmal atrial tachycardia: -see prior discussion. Elect not to pursue monitor/loop recorder -denies any recent symptoms of  PAT  Murmur Tricuspid regurgitation Elevated pulmonary pressures -appears euvolemic on exam -soft murmur -discussed potential etiologies of elevated PA pressures. She would like to manage conservatively, avoid invasive testing -no diuretics needed today, may need PRN in the future -she is being weighed 3x/week at her facility. Weights have been stable.  Cardiac risk counseling and prevention recommendations: -she has been on statin long term, tolerating. 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  Buford Dresser, MD, PhD, [facility]    Medication Adjustments/Labs and Tests Ordered: Current medicines are reviewed at length with the patient today.  Concerns regarding medicines are outlined above.   No orders of the defined types were placed in this encounter.  No orders of the defined types were placed in this encounter.  Patient Instructions  Medication Instructions:  Your Physician recommend you continue on your current medication as directed.    *If you need a refill on your cardiac medications before your next appointment, please call your pharmacy*   Lab Work: None ordered today   Testing/Procedures: None ordered today   Follow-Up: At [facility], you and your health needs are our priority.  As part of our continuing  mission to provide you with exceptional heart care, we have created designated Provider Care Teams.  These Care Teams include your primary Cardiologist (physician) and Advanced Practice Providers (APPs -  Physician Assistants and Nurse Practitioners) who all work together to provide you with the care you need, when you need it.  We recommend signing up for the patient portal called "MyChart".  Sign up information is provided on this After Visit Summary.  MyChart is used to connect with patients for Virtual Visits (Telemedicine).  Patients are able to view lab/test results, encounter notes, upcoming appointments, etc.  Non-urgent messages can be sent to your provider as well.   To learn more about what you can do with MyChart, go to [email or web address].    Your next appointment:   6 month(s)  The format for your next appointment:   In Person  Provider:   Buford Dresser, MD{           I,Mathew Stumpf,acting as a scribe for Buford Dresser, MD.,have documented all relevant documentation on the behalf of Buford Dresser, MD,as directed by  Buford Dresser, MD while in the presence of Buford Dresser, MD.  I, Buford Dresser, MD, have reviewed all documentation for this visit. The documentation on 03/30/22 for the exam, diagnosis, procedures, and orders are all accurate and complete.   Signed, Buford Dresser, MD PhD 03/30/2022 1:20 PM    [facility]

## 2022-04-04 ENCOUNTER — Ambulatory Visit (INDEPENDENT_AMBULATORY_CARE_PROVIDER_SITE_OTHER): Payer: Medicare Other | Admitting: Orthopaedic Surgery

## 2022-04-04 ENCOUNTER — Encounter: Payer: Self-pay | Admitting: Orthopaedic Surgery

## 2022-04-04 ENCOUNTER — Ambulatory Visit (INDEPENDENT_AMBULATORY_CARE_PROVIDER_SITE_OTHER): Payer: Medicare Other

## 2022-04-04 VITALS — BP 142/54 | HR 71 | Ht 65.0 in | Wt 115.0 lb

## 2022-04-04 DIAGNOSIS — I63412 Cerebral infarction due to embolism of left middle cerebral artery: Secondary | ICD-10-CM

## 2022-04-04 DIAGNOSIS — S32000G Wedge compression fracture of unspecified lumbar vertebra, subsequent encounter for fracture with delayed healing: Secondary | ICD-10-CM

## 2022-04-04 NOTE — Progress Notes (Addendum)
Office Visit Note   Patient: Linda [name]           Date of Birth: [birth year]           MRN: [id number] Visit Date: 04/04/2022              Requested by: Ardith Dark, MD [address] [place],  [place] [address] PCP: Ardith Dark, MD   Assessment & Plan: Visit Diagnoses:  1. Compression fracture of lumbar vertebra with delayed healing, unspecified lumbar vertebral level, subsequent encounter     Plan: Stable compression fractures L2 and L3.  L2 is most recent.  She is gradually improving every month.  No further compression I will release her from care and check her back again on an as-needed basis.  I wrote a prescription for her that she can take 2 Tylenol either 3 or 4 times a day as needed for pain since she states the 2 Tylenol worked very well for pain relief.  If her lab work later shows changes in her liver enzymes then she could cut back to 3 doses per day..  Follow-Up Instructions: No follow-ups on file.   Orders:  Orders Placed This Encounter  Procedures   XR Lumbar Spine 2-3 Views   No orders of the defined types were placed in this encounter.     Procedures: No procedures performed   Clinical Data: No additional findings.   Subjective: Chief Complaint  Patient presents with   Lower Back - Fracture, Follow-up    HPI 88 year old female returns for follow-up of compression fractures.  He is using a rolling walker with reverse seat.  She still complains of pain.  She takes 3 doses of 2 Tylenol each 3 times a day and has had 7 blood draws all showing normal liver functions.  She is requesting to take a fourth dose but is at assisted living facility and they have not allowed it.  She is occasionally taken 2.5 of oxycodone when her pain is severe but she tries to avoid this.  She has recently gone shopping with her daughter and goes out to eat lunch as well.   Review of Systems Updated unchanged all other systems updated unchanged   Objective: Vital  Signs: BP (!) 142/54   Pulse 71   Ht 5\' 5"  (1.651 m)   Wt 115 lb (52.2 kg)   BMI 19.14 kg/m   Physical Exam Constitutional:      Appearance: She is well-developed.  HENT:     Head: Normocephalic.     Right Ear: External ear normal.     Left Ear: External ear normal. There is no impacted cerumen.  Eyes:     Pupils: Pupils are equal, round, and reactive to light.  Neck:     Thyroid: No thyromegaly.     Trachea: No tracheal deviation.  Cardiovascular:     Rate and Rhythm: Normal rate.  Pulmonary:     Effort: Pulmonary effort is normal.  Abdominal:     Palpations: Abdomen is soft.  Musculoskeletal:     Cervical back: No rigidity.  Skin:    General: Skin is warm and dry.  Neurological:     Mental Status: She is alert and oriented to person, place, and time.  Psychiatric:        Behavior: Behavior normal.     Ortho Exam patient is ambulatory with a rolling walker.  Good ankle dorsiflexion plantarflexion.  Specialty Comments:  No specialty comments available.  Imaging: XR Lumbar Spine 2-3 Views  Result Date: 04/04/2022 2 view x-rays lumbar spine AP and lateral demonstrate scoliosis with unchanged 30% compression L2 and L3.  Generalized osteopenia  noted. Impression: Stable L2-L3 compression fractures approximately 30%    PMFS History: Patient Active Problem List   Diagnosis Date Noted   Lumbar compression fracture (HCC) 01/25/2022   Cerebral thrombosis with cerebral infarction 09/22/2021   Hypokalemia 09/21/2021   Chronic diastolic CHF (congestive heart failure) (HCC) 09/21/2021   COPD (chronic obstructive pulmonary disease) (HCC) 09/21/2021   Altered mental status 09/19/2021   Osteoarthritis 08/12/2021   Dermatitis 04/13/2021   Multinodular goiter 11/19/2020   Hypercalcemia 11/19/2020   Hyperparathyroidism (HCC) 11/19/2020   Bronchiectasis without acute exacerbation (HCC) 07/14/2019   Chronic cough 02/03/2019   Thoracic back pain 10/13/2018   Hyperlipidemia  10/13/2018   Vitamin D deficiency 10/13/2018   Gastroesophageal reflux disease without esophagitis 10/13/2018   Chronic midline thoracic back pain 10/13/2018   Osteoporosis without current pathological fracture 10/13/2018   Essential hypertension 10/13/2018   Stenosis of carotid artery 10/13/2018   History of thyroid nodule, s/p bx, benign 10/13/2018   Aortic cusp regurgitation 10/13/2018   Nonrheumatic tricuspid valve regurgitation 10/13/2018   Coccyalgia 10/13/2018   Irritable bowel syndrome 10/13/2018   Bilateral hearing loss 10/13/2018   Chronic rhinitis 10/13/2018   Paroxysmal atrial tachycardia (HCC) 10/13/2018   Past Medical History:  Diagnosis Date   Arthritis    COPD (chronic obstructive pulmonary disease) (HCC)    chronic cough   Glaucoma    Hyperlipidemia    Hypertension    Irregular heartbeat    Osteoporosis    Thyroid disease    enlarged thyroid    Family History  Problem Relation Age of Onset   Stroke Mother    Heart disease Mother    Heart disease Father    Heart disease Brother    Heart disease Brother    Colon cancer Neg Hx    Colon polyps Neg Hx    Esophageal cancer Neg Hx     Past Surgical History:  Procedure Laterality Date   APPENDECTOMY     cateract surgery     COLONOSCOPY     Done in [place]  yrs ago   UPPER GASTROINTESTINAL ENDOSCOPY  2019   WRIST SURGERY     Social History   Occupational History   Occupation: Retired  Tobacco Use   Smoking status: Former    Packs/day: 0.25    Years: 2.00    Total pack years: 0.50    Types: Cigarettes    Start date: 1950    Quit date: 1952    Years since quitting: 71.6    Passive exposure: Past   Smokeless tobacco: Never  Vaping Use   Vaping Use: Never used  Substance and Sexual Activity   Alcohol use: Not Currently   Drug use: Never   Sexual activity: Not on file

## 2022-05-08 ENCOUNTER — Encounter: Payer: Self-pay | Admitting: *Deleted

## 2022-05-10 NOTE — Progress Notes (Signed)
NEUROLOGY FOLLOW UP OFFICE NOTE  Linda [name] [id number]  Assessment/Plan:   Left posterior MCA infarct, embolic - concerning for cardiac source such as a fib Paroxysmal atrial tachycardia Hypertension - elevated today - repeat check still elevated but significantly improved. Hyperlipidemia   1  Secondary stroke prevention as managed by PCP and cardiology  ASA 49m daily             Statin therapy:  LDL goal less than 70             Normotensive blood pressure (follow up with PCP)             Hgb A1c goal less than 7 3  Follow up as needed.   Subjective:  Linda Drawdyis a 88year old right-handed female with HTN, HLD, COPD and glaucoma who follows up for stroke.  She is accompanied by her daughter who supplements history.  UPDATE: Current medications:  ASA 877m amlodipine, rosuvastatin 208mShe and her daughter met with cardiology.  They decided not to pursue cardiac event monitoring.  Notes double vision since the stroke.  The ophthalmologist can't do anything else for her.  Mostly she is experiencing chronic low back pain.       HISTORY: On 09/13/2021, patient became confused.  Seen in the ED where BP was 217/74.  CT head unremarkable.  Follow up MRI of brain revealed left greater than right foci of hemosiderin deposition but no acute intracranial abnormalities.  Blood pressure was treated and symptoms improved but patient then left AMA.  The next day she started having word-finding difficulty.  Returned to the ED.  Labs showed no leukocytosis, electrolyte abnormalities.  Admission was recommended but patient again left AMA.  She continued to have language disorder and cognitive decline.  On 09/19/2021, she was advised by her PCP to go back to the ED for admission.  Repeat MRI of brain on 2/8 showed patchy acute infarct in the low left parietal lobe.  CTA head and neck showed 25% diameter stenosis proximal right internal carotid artery.  2D echocardiogram showed EF 70-75%.  LDL was  51 and Hgb A1c was 5.8.  Patient has history of paroxysmal atrial tachycardia.  Patient refused loop recorder or 30 day cardiac event monitoring to evaluate for a fib.  She was discharged on ASA 37m10md Plavix 75mg9mly for 3 weeks followed by ASA 37mg 62my alone.  She was living in independent living.  Now temporarily in [facility].  Waiting for opening in assisted living.  Language improved.  However, she feels more forgetful.  Having problems swallowing.  Saw GI.  Barium swallow unremarkable.  Thought to be secondary to stroke.  She also notes some more double vision.  Has upcoming appointment with ophthalmology.    ,l PAST MEDICAL HISTORY: Past Medical History:  Diagnosis Date   Arthritis    COPD (chronic obstructive pulmonary disease) (HCC)  Guinhronic cough   Glaucoma    Hyperlipidemia    Hypertension    Irregular heartbeat    Osteoporosis    Thyroid disease    enlarged thyroid    MEDICATIONS: Current Outpatient Medications on File Prior to Visit  Medication Sig Dispense Refill   acetaminophen (TYLENOL) 500 MG tablet Take 1,000 mg by mouth every 6 (six) hours as needed.     amLODipine (NORVASC) 2.5 MG tablet Take 1 tablet (2.5 mg total) by mouth daily. 90 tablet 3   aspirin EC 81 MG  EC tablet Take 1 tablet (81 mg total) by mouth daily. Swallow whole. 30 tablet 11   Cholecalciferol (VITAMIN D3) 25 MCG (1000 UT) CAPS Take 1 capsule by mouth 2 (two) times daily.     clobetasol cream (TEMOVATE) 1.03 % Apply 1 application topically as needed. 60 g 5   famotidine (PEPCID) 20 MG tablet Take 1 tablet (20 mg total) by mouth 2 (two) times daily. 180 tablet 3   INFANTS SIMETHICONE 20 MG/0.3ML drops 2 (two) times daily.     lidocaine (LIDODERM) 5 % Place 1 patch onto the skin daily.     mirtazapine (REMERON) 30 MG tablet Take 30 mg by mouth at bedtime.     oxycodone-acetaminophen (LYNOX) 2.5-300 MG per tablet Take 1 tablet by mouth every 4 (four) hours as needed for pain.      rosuvastatin (CRESTOR) 20 MG tablet TAKE 1 TABLET DAILY 90 tablet 3   sotalol (BETAPACE) 80 MG tablet Take 1 tablet (80 mg total) by mouth every 12 (twelve) hours. 180 tablet 3   timolol (TIMOPTIC) 0.25 % ophthalmic solution Place 1 drop into both eyes daily.     No current facility-administered medications on file prior to visit.    ALLERGIES: No Known Allergies  FAMILY HISTORY: Family History  Problem Relation Age of Onset   Stroke Mother    Heart disease Mother    Heart disease Father    Heart disease Brother    Heart disease Brother    Colon cancer Neg Hx    Colon polyps Neg Hx    Esophageal cancer Neg Hx       Objective:  Blood pressure (!) 164/75, pulse 66, height _0  (1.626 m), weight 119 lb 12.8 oz (54.3 kg), SpO2 96 %. General: No acute distress.  Patient appears well-groomed.   Head:  Normocephalic/atraumatic Eyes:  Fundi examined but not visualized Neck: supple, no paraspinal tenderness, full range of motion Heart:  Regular rate and rhythm with murmur Neurological Exam: alert and oriented to person, place, and time.  Speech fluent and not dysarthric, language intact.  CN II-XII intact. Bulk and tone normal, muscle strength 5/5.  Sensation to light touch intact.  Deep tendon reflexes absent throughout, toes downgoing.  Finger to nose testing intact.  Gait cautious/broad-based Romberg with sway   Linda Clines, DO  CC: Linda Chyle, MD

## 2022-05-12 ENCOUNTER — Ambulatory Visit (INDEPENDENT_AMBULATORY_CARE_PROVIDER_SITE_OTHER): Payer: Medicare Other | Admitting: Neurology

## 2022-05-12 ENCOUNTER — Encounter: Payer: Self-pay | Admitting: Neurology

## 2022-05-12 VITALS — BP 164/75 | HR 66 | Ht 64.0 in | Wt 119.8 lb

## 2022-05-12 DIAGNOSIS — I63412 Cerebral infarction due to embolism of left middle cerebral artery: Secondary | ICD-10-CM

## 2022-05-12 DIAGNOSIS — I10 Essential (primary) hypertension: Secondary | ICD-10-CM | POA: Diagnosis not present

## 2022-05-12 DIAGNOSIS — E785 Hyperlipidemia, unspecified: Secondary | ICD-10-CM | POA: Diagnosis not present

## 2022-05-12 NOTE — Patient Instructions (Addendum)
Continue aspirin, rosuvastatin and amlodipine Follow up as needed.

## 2022-05-16 ENCOUNTER — Ambulatory Visit: Payer: Medicare Other | Admitting: Family Medicine

## 2022-05-18 ENCOUNTER — Ambulatory Visit (INDEPENDENT_AMBULATORY_CARE_PROVIDER_SITE_OTHER): Payer: Medicare Other | Admitting: Family Medicine

## 2022-05-18 ENCOUNTER — Encounter: Payer: Self-pay | Admitting: Family Medicine

## 2022-05-18 VITALS — BP 144/59 | HR 62 | Temp 98.0°F | Ht 64.0 in | Wt 117.8 lb

## 2022-05-18 DIAGNOSIS — S32000G Wedge compression fracture of unspecified lumbar vertebra, subsequent encounter for fracture with delayed healing: Secondary | ICD-10-CM

## 2022-05-18 DIAGNOSIS — Z23 Encounter for immunization: Secondary | ICD-10-CM | POA: Diagnosis not present

## 2022-05-18 DIAGNOSIS — J449 Chronic obstructive pulmonary disease, unspecified: Secondary | ICD-10-CM | POA: Diagnosis not present

## 2022-05-18 DIAGNOSIS — I10 Essential (primary) hypertension: Secondary | ICD-10-CM

## 2022-05-18 DIAGNOSIS — G4700 Insomnia, unspecified: Secondary | ICD-10-CM

## 2022-05-18 DIAGNOSIS — K219 Gastro-esophageal reflux disease without esophagitis: Secondary | ICD-10-CM | POA: Diagnosis not present

## 2022-05-18 DIAGNOSIS — M1990 Unspecified osteoarthritis, unspecified site: Secondary | ICD-10-CM

## 2022-05-18 MED ORDER — TRAMADOL HCL 50 MG PO TABS: 25.0000 mg | ORAL_TABLET | Freq: Four times a day (QID) | ORAL | 5 refills | Status: DC | PRN

## 2022-05-18 NOTE — Assessment & Plan Note (Signed)
Has followed with orthopedics for this.  Still has some lower back pain as well as tailbone pain.  We discussed treatment options.  Recommended she avoid ongoing use of oxycodone.  We will try very low-dose tramadol to see how she does with this.  We discussed potential side effects.  She will follow-up in a few weeks via MyChart.  Follow-up in 3 months.

## 2022-05-18 NOTE — Assessment & Plan Note (Signed)
On Remeron 30 mg nightly.  Daughter would like for [place] to take over this prescription.  They will let me know when she needs refill.  She is doing well with this.  We have also discussed potentially starting Cymbalta daily request of one of the nurses at her facility.  We will hold off on this for now.  We did discuss this potentially could help some with her underlying chronic pain that we can address again in the future.

## 2022-05-18 NOTE — Assessment & Plan Note (Signed)
She can continue over-the-counter medications Tylenol and topical Voltaren as needed.  We will be adding on tramadol as above.

## 2022-05-18 NOTE — Assessment & Plan Note (Signed)
At goal per JNC 8 today.  She is currently on amlodipine 2.5 mg daily.

## 2022-05-18 NOTE — Assessment & Plan Note (Signed)
Still has a bit of a issue with chronic cough however this is currently stable and manageable.  We did discuss adding on a daily inhaler however she deferred for now.

## 2022-05-18 NOTE — Patient Instructions (Signed)
It was very nice to see you today!  Please start the tramadol.  Please let this is working for you in a few weeks.  Get your flu shot today.  I will see back in 3 months.  Come back sooner if needed.  Take care, Dr Jerline Pain  PLEASE NOTE:  If you had any lab tests please let us know if you have not heard back within a few days. You may see your results on mychart before we have a chance to review them but we will give you a call once they are reviewed by [place]. If we ordered any referrals today, please let us know if you have not heard from their office within the next week.   Please try these tips to maintain a healthy lifestyle:  Eat at least 3 REAL meals and 1-2 snacks per day.  Aim for no more than 5 hours between eating.  If you eat breakfast, please do so within one hour of getting up.   Each meal should contain half fruits/vegetables, one quarter protein, and one quarter carbs (no bigger than a computer mouse)  Cut down on sweet beverages. This includes juice, soda, and sweet tea.   Drink at least 1 glass of water with each meal and aim for at least 8 glasses per day  Exercise at least 150 minutes every week.

## 2022-05-18 NOTE — Assessment & Plan Note (Signed)
Stable on pepcid 20mg  twice daily and tums as needed

## 2022-05-18 NOTE — Progress Notes (Signed)
   Linda [name] is a 88 y.o. female who presents today for an office visit.  Assessment/Plan:  Chronic Problems Addressed Today: Gastroesophageal reflux disease without esophagitis Stable on pepcid 20mg  twice daily and tums as needed  Lumbar compression fracture ([place]) Has followed with orthopedics for this.  Still has some lower back pain as well as tailbone pain.  We discussed treatment options.  Recommended she avoid ongoing use of oxycodone.  We will try very low-dose tramadol to see how she does with this.  We discussed potential side effects.  She will follow-up in a few weeks via MyChart.  Follow-up in 3 months.    COPD (chronic obstructive pulmonary disease) ([place]) Still has a bit of a issue with chronic cough however this is currently stable and manageable.  We did discuss adding on a daily inhaler however she deferred for now.  Osteoarthritis She can continue over-the-counter medications Tylenol and topical Voltaren as needed.  We will be adding on tramadol as above.  Essential hypertension At goal per JNC 8 today.  She is currently on amlodipine 2.5 mg daily.  Insomnia On Remeron 30 mg nightly.  Daughter would like for [place] to take over this prescription.  They will let me know when she needs refill.  She is doing well with this.  We have also discussed potentially starting Cymbalta daily request of one of the nurses at her facility.  We will hold off on this for now.  We did discuss this potentially could help some with her underlying chronic pain that we can address again in the future.  Flu shot given today.     Subjective:  HPI:  See A/p for status of chronic conditions.         Objective:  Physical Exam: BP (!) 144/59   Pulse 62   Temp 98 F (36.7 C) (Temporal)   Ht 5\' 4"  (1.626 m)   Wt 117 lb 12.8 oz (53.4 kg)   SpO2 97%   BMI 20.22 kg/m   Gen: No acute distress, resting comfortably CV: Regular rate and rhythm with no murmurs appreciated Pulm: Normal work  of breathing, clear to auscultation bilaterally with no crackles, wheezes, or rhonchi Neuro: Grossly normal, moves all extremities Psych: Normal affect and thought content      Markevius Trombetta M. Jerline Pain, MD 05/18/2022 12:17 PM

## 2022-05-22 ENCOUNTER — Encounter: Payer: Self-pay | Admitting: Family Medicine

## 2022-05-22 NOTE — Telephone Encounter (Signed)
Yes that is correct. She should avoid oxycodone while taking tramadol.  Algis Greenhouse. Jerline Pain, MD 05/22/2022 8:59 AM

## 2022-05-22 NOTE — Telephone Encounter (Signed)
Please advise 

## 2022-05-24 ENCOUNTER — Telehealth: Payer: Self-pay | Admitting: Family Medicine

## 2022-05-24 ENCOUNTER — Telehealth (INDEPENDENT_AMBULATORY_CARE_PROVIDER_SITE_OTHER): Payer: Medicare Other | Admitting: Family Medicine

## 2022-05-24 ENCOUNTER — Encounter: Payer: Self-pay | Admitting: Family Medicine

## 2022-05-24 VITALS — Ht 64.0 in

## 2022-05-24 DIAGNOSIS — U071 COVID-19: Secondary | ICD-10-CM | POA: Diagnosis not present

## 2022-05-24 DIAGNOSIS — J449 Chronic obstructive pulmonary disease, unspecified: Secondary | ICD-10-CM | POA: Diagnosis not present

## 2022-05-24 MED ORDER — ALBUTEROL SULFATE HFA 108 (90 BASE) MCG/ACT IN AERS: 1.0000 | INHALATION_SPRAY | Freq: Four times a day (QID) | RESPIRATORY_TRACT | 0 refills | Status: DC | PRN

## 2022-05-24 MED ORDER — MOLNUPIRAVIR EUA 200MG CAPSULE: 4.0000 | ORAL_CAPSULE | Freq: Two times a day (BID) | ORAL | 0 refills | Status: AC

## 2022-05-24 NOTE — Progress Notes (Signed)
Virtual Visit via Telephone Note I connected with Linda [name] on 05/24/22 at  4:30 PM EDT by telephone and verified that I am speaking with the correct person using two identifiers. Tried to connect but not able to get video, after 3-4 min we changed visit to telephone call.   I discussed the limitations, risks, security and privacy concerns of performing an evaluation and management service by telephone and the availability of in person appointments. I also discussed with the patient that there may be a patient responsible charge related to this service. The patient expressed understanding and agreed to proceed.  Location patient: home Location provider: work office Participants present for the call: patient, provider, and daughter. Patient did not have a visit in the prior 7 days to address this/these issue(s).  Chief Complaint  Patient presents with   Covid Positive   History of Present Illness: Linda [name] is a 88 y.o.female with hx of PAD, paroxysmal atrial tachycardia, hyperparathyroidism, COPD, nontraumatic tricuspid valve regurgitation, and chronic pain complaining of a couple days of respiratory symptoms. +Fatigue, nasal congestion, rhinorrhea, postnasal drainage, and nonproductive cough. Negative for headache, fever, chills, sore throat, dyspnea, wheezing, abdominal pain, changes in bowel habits, nausea, vomiting, urinary symptoms, or a skin rash.  Performed home COVID-19 test x3, all positive. She lives in assisted living.  No known sick contacts and no recent travel. She has been taking Tylenol and Mucinex DM. Her daughter wonders if she really needs to be treated with antiviral medication.  COPD: She has not had an inhaler in a while.  Observations/Objective: Patient sounds cheerful and well on the phone. I do not appreciate any SOB. Speech and thought processing are grossly intact. Patient reported vitals:Ht 5\' 4"  (1.626 m)   BMI 20.22 kg/m   Assessment and  Plan: 1. COVID-19 virus infection We discussed Dx,possible complications and treatment options. She has a mild case with high risk for complications, so I recommend treatment. We discussed oral antiviral options and side effects. She agrees with trying molnupiravir. Symptomatic treatment with plenty of fluids,rest,tylenol 500 mg 3-4 times per day prn. Caution with Mucinex DM due to risk of side effects from decongestant, I prefer plain Mucinex. 5 to 7 days of quarantine recommended. Clearly instructed about warning signs.  - molnupiravir EUA (LAGEVRIO) 200 mg CAPS capsule; Take 4 capsules (800 mg total) by mouth 2 (two) times daily for 5 days.  Dispense: 40 capsule; Refill: 0  2. Chronic obstructive pulmonary disease, unspecified COPD type ([place]) She is not having symptoms suggesting an acute exacerbation. Recommend having albuterol at home in case she needs it, 1 puff every 6 hours as needed.We discussed some side effects.  - albuterol (VENTOLIN HFA) 108 (90 Base) MCG/ACT inhaler; Inhale 1-2 puffs into the lungs every 6 (six) hours as needed for wheezing or shortness of breath.  Dispense: 8 g; Refill: 0  Follow Up Instructions:  Return if symptoms worsen or fail to improve.  I did not refer this patient for an OV in the next 24 hours for this/these issue(s).  I discussed the assessment and treatment plan with the patient. The patient was provided an opportunity to ask questions and all were answered. The patient agreed with the plan and demonstrated an understanding of the instructions.   The patient was advised to call back or seek an in-person evaluation if the symptoms worsen or if the condition fails to improve as anticipated.  I provided  11 minutes of non-face-to-face time during this encounter.  Trevaughn Schear G. [place], MD  [facility]. [place] office.

## 2022-05-24 NOTE — Telephone Encounter (Signed)
Linda [name] with [facility] states RX for traMADol (ULTRAM) 50 MG tablet  must state Tramadol 50 mg, take 1/2 tablet by mouth every 6 hours as needed.  Linda [name] requests the above RX with directions above be faxed to Attn: [facility] Fax# [phone]

## 2022-05-25 NOTE — Telephone Encounter (Signed)
Spoke with April with [facility] is requesting to send a new Rx ultram with Sig 1/2 table PO PRN .

## 2022-05-25 NOTE — Telephone Encounter (Signed)
April with [facility] is asking for an update on the previous msg. Please call at [phone]

## 2022-05-25 NOTE — Telephone Encounter (Signed)
Ok with me. Please place any necessary orders. 

## 2022-05-26 ENCOUNTER — Telehealth: Payer: Self-pay | Admitting: Family Medicine

## 2022-05-26 NOTE — Telephone Encounter (Signed)
Linda [name] called again wanting an update- stated she never received fax with Rx clarification -  Stated she would take verbal order so that patient wont be in pain -   Linda [name] is faxing order to my attention ([place]) I will provide fax to Dr Ellwood Handler Team for signature.

## 2022-05-26 NOTE — Telephone Encounter (Signed)
error 

## 2022-05-26 NOTE — Telephone Encounter (Signed)
CAn we please clarify again? Do they want [place] to fax a paper copy of the rx to them or do they want [place] to send a new rx to the pharmacy.  Algis Greenhouse. Jerline Pain, MD 05/26/2022 11:20 AM

## 2022-05-29 ENCOUNTER — Encounter: Payer: Self-pay | Admitting: Family Medicine

## 2022-05-29 NOTE — Telephone Encounter (Signed)
Fax from April at [facility] has been received. Fax has been signed and dated. Fax has been sent to scan for documentation.

## 2022-05-29 NOTE — Telephone Encounter (Signed)
See note

## 2022-05-30 NOTE — Telephone Encounter (Signed)
Please print the rx so that we can fax it over to the facility.  Algis Greenhouse. Jerline Pain, MD 05/30/2022 7:39 AM

## 2022-06-02 ENCOUNTER — Other Ambulatory Visit: Payer: Self-pay

## 2022-06-02 MED ORDER — TRAMADOL HCL 50 MG PO TABS: 25.0000 mg | ORAL_TABLET | Freq: Four times a day (QID) | ORAL | 5 refills | Status: DC | PRN

## 2022-06-12 ENCOUNTER — Other Ambulatory Visit: Payer: Self-pay | Admitting: Family Medicine

## 2022-06-12 DIAGNOSIS — I4719 Other supraventricular tachycardia: Secondary | ICD-10-CM

## 2022-06-12 DIAGNOSIS — F341 Dysthymic disorder: Secondary | ICD-10-CM

## 2022-06-30 ENCOUNTER — Ambulatory Visit (INDEPENDENT_AMBULATORY_CARE_PROVIDER_SITE_OTHER): Payer: Medicare Other | Admitting: Orthopaedic Surgery

## 2022-06-30 ENCOUNTER — Encounter: Payer: Self-pay | Admitting: Orthopaedic Surgery

## 2022-06-30 ENCOUNTER — Ambulatory Visit (INDEPENDENT_AMBULATORY_CARE_PROVIDER_SITE_OTHER): Payer: Medicare Other

## 2022-06-30 VITALS — BP 174/76 | HR 76 | Ht 64.0 in | Wt 117.0 lb

## 2022-06-30 DIAGNOSIS — I63412 Cerebral infarction due to embolism of left middle cerebral artery: Secondary | ICD-10-CM

## 2022-06-30 DIAGNOSIS — M79605 Pain in left leg: Secondary | ICD-10-CM | POA: Diagnosis not present

## 2022-06-30 DIAGNOSIS — M7062 Trochanteric bursitis, left hip: Secondary | ICD-10-CM | POA: Diagnosis not present

## 2022-06-30 DIAGNOSIS — M545 Low back pain, unspecified: Secondary | ICD-10-CM

## 2022-06-30 NOTE — Progress Notes (Signed)
Office Visit Note   Patient: Linda [name]           Date of Birth: May 02, 1924           MRN: [id number] Visit Date: 06/30/2022              Requested by: Ardith Dark, MD [address] [place],  [place] [address] PCP: Ardith Dark, MD   Assessment & Plan: Visit Diagnoses:  1. Acute midline low back pain, unspecified whether sciatica present   2. Pain in left leg   3. Trochanteric bursitis, left hip     Plan: Left trochanteric bursa injection performed which she tolerated well.  She can follow-up on an as-needed basis.  Patient expressed she wants to take more pain medication more regularly and we discussed in detail problems with increasing narcotic doses, tolerance etc.  Follow-Up Instructions: No follow-ups on file.   Orders:  Orders Placed This Encounter  Procedures   XR Lumbar Spine 2-3 Views   XR Pelvis 1-2 Views   No orders of the defined types were placed in this encounter.     Procedures: No procedures performed   Clinical Data: No additional findings.   Subjective: Chief Complaint  Patient presents with   Left Leg - Pain   Lower Back - Pain    HPI 88 year old female returns her daughter is with her.  She continues to complain of back pain.  She has lumbar scoliosis has compression fractures L2 and L3 and states she has been hurting worse for the last 2 weeks.  Repeat x-rays obtained today shows no new compression fractures.  Pelvis x-ray is negative hips are normal.  Patient currently has tramadol twice a day ordered as needed taking Tylenol regularly.  Review of Systems all other systems are noncontributory to HPI.  Of note is history of bronchiectasis, chronic diastolic heart failure, COPD.  Previous CVA.   Objective: Vital Signs: BP (!) 174/76   Pulse 76   Ht 5\' 4"  (1.626 m)   Wt 117 lb (53.1 kg)   BMI 20.08 kg/m   Physical Exam Constitutional:      Appearance: She is well-developed.  HENT:     Head: Normocephalic.     Right Ear:  External ear normal.     Left Ear: External ear normal. There is no impacted cerumen.  Eyes:     Pupils: Pupils are equal, round, and reactive to light.  Neck:     Thyroid: No thyromegaly.     Trachea: No tracheal deviation.  Cardiovascular:     Rate and Rhythm: Normal rate.  Pulmonary:     Effort: Pulmonary effort is normal.  Abdominal:     Palpations: Abdomen is soft.  Musculoskeletal:     Cervical back: No rigidity.  Skin:    General: Skin is warm and dry.  Neurological:     Mental Status: She is alert and oriented to person, place, and time.  Psychiatric:        Behavior: Behavior normal.     Ortho Exam patient has tenderness mid lower lumbar region where she has lumbar curvature.  Exquisite tenderness over the left greater trochanter.  Mild on the right.  Negative logroll the hips knees reach full extension.  Specialty Comments:  No specialty comments available.  Imaging: XR Pelvis 1-2 Views  Result Date: 06/30/2022 AP pelvis x-ray obtained.  This shows normal pelvis anatomy.  X-rays suggest osteopenia/osteoporosis.  Hip joints are normal.  No evidence of  pelvic stress fracture. Impression: Pelvis x-ray negative for stress fracture or acute fracture.  XR Lumbar Spine 2-3 Views  Result Date: 06/30/2022 AP lateral images lumbar spine obtained and reviewed.  Compression fractures L2 and L3 again noted unchanged from 04/04/2022 images.  Lumbar curvature again noted.  No new compression fractures. Impression: Stable osteoporosis with lumbar compression chronic at L2 and L3 3 with no new or further compression.    PMFS History: Patient Active Problem List   Diagnosis Date Noted   Trochanteric bursitis, left hip 06/30/2022   Insomnia 05/18/2022   Lumbar compression fracture (HCC) 01/25/2022   Cerebral thrombosis with cerebral infarction 09/22/2021   Hypokalemia 09/21/2021   Chronic diastolic CHF (congestive heart failure) (HCC) 09/21/2021   COPD (chronic obstructive  pulmonary disease) (HCC) 09/21/2021   Osteoarthritis 08/12/2021   Dermatitis 04/13/2021   Multinodular goiter 11/19/2020   Hypercalcemia 11/19/2020   Hyperparathyroidism (HCC) 11/19/2020   Bronchiectasis without acute exacerbation (HCC) 07/14/2019   Chronic cough 02/03/2019   Thoracic back pain 10/13/2018   Hyperlipidemia 10/13/2018   Vitamin D deficiency 10/13/2018   Gastroesophageal reflux disease without esophagitis 10/13/2018   Chronic midline thoracic back pain 10/13/2018   Osteoporosis without current pathological fracture 10/13/2018   Essential hypertension 10/13/2018   Stenosis of carotid artery 10/13/2018   History of thyroid nodule, s/p bx, benign 10/13/2018   Aortic cusp regurgitation 10/13/2018   Nonrheumatic tricuspid valve regurgitation 10/13/2018   Coccyalgia 10/13/2018   Irritable bowel syndrome 10/13/2018   Bilateral hearing loss 10/13/2018   Chronic rhinitis 10/13/2018   Paroxysmal atrial tachycardia 10/13/2018   Past Medical History:  Diagnosis Date   Arthritis    COPD (chronic obstructive pulmonary disease) (HCC)    chronic cough   Glaucoma    Hyperlipidemia    Hypertension    Irregular heartbeat    Osteoporosis    Thyroid disease    enlarged thyroid    Family History  Problem Relation Age of Onset   Stroke Mother    Heart disease Mother    Heart disease Father    Heart disease Brother    Heart disease Brother    Colon cancer Neg Hx    Colon polyps Neg Hx    Esophageal cancer Neg Hx     Past Surgical History:  Procedure Laterality Date   APPENDECTOMY     cateract surgery     COLONOSCOPY     Done in [place]  yrs ago   UPPER GASTROINTESTINAL ENDOSCOPY  2019   WRIST SURGERY     Social History   Occupational History   Occupation: Retired  Tobacco Use   Smoking status: Former    Packs/day: 0.25    Years: 2.00    Total pack years: 0.50    Types: Cigarettes    Start date: 1950    Quit date: 1952    Years since quitting: 71.9    Passive  exposure: Past   Smokeless tobacco: Never  Vaping Use   Vaping Use: Never used  Substance and Sexual Activity   Alcohol use: Not Currently   Drug use: Never   Sexual activity: Not on file

## 2022-08-08 ENCOUNTER — Other Ambulatory Visit: Payer: Self-pay

## 2022-08-08 ENCOUNTER — Emergency Department (HOSPITAL_BASED_OUTPATIENT_CLINIC_OR_DEPARTMENT_OTHER): Payer: Medicare Other

## 2022-08-08 ENCOUNTER — Encounter (HOSPITAL_BASED_OUTPATIENT_CLINIC_OR_DEPARTMENT_OTHER): Payer: Self-pay

## 2022-08-08 ENCOUNTER — Inpatient Hospital Stay (HOSPITAL_BASED_OUTPATIENT_CLINIC_OR_DEPARTMENT_OTHER)
Admission: EM | Admit: 2022-08-08 | Discharge: 2022-08-14 | DRG: 389 | Disposition: A | Discharge status: Skilled Nursing Facility | Payer: Medicare Other | Attending: Internal Medicine | Admitting: Internal Medicine

## 2022-08-08 ENCOUNTER — Emergency Department (HOSPITAL_BASED_OUTPATIENT_CLINIC_OR_DEPARTMENT_OTHER): Payer: Medicare Other | Admitting: Radiology

## 2022-08-08 ENCOUNTER — Telehealth: Payer: Self-pay | Admitting: Family Medicine

## 2022-08-08 DIAGNOSIS — D72829 Elevated white blood cell count, unspecified: Secondary | ICD-10-CM | POA: Insufficient documentation

## 2022-08-08 DIAGNOSIS — K56609 Unspecified intestinal obstruction, unspecified as to partial versus complete obstruction: Principal | ICD-10-CM | POA: Diagnosis present

## 2022-08-08 DIAGNOSIS — J449 Chronic obstructive pulmonary disease, unspecified: Secondary | ICD-10-CM | POA: Diagnosis present

## 2022-08-08 DIAGNOSIS — K319 Disease of stomach and duodenum, unspecified: Secondary | ICD-10-CM | POA: Diagnosis present

## 2022-08-08 DIAGNOSIS — Z87891 Personal history of nicotine dependence: Secondary | ICD-10-CM

## 2022-08-08 DIAGNOSIS — Z79899 Other long term (current) drug therapy: Secondary | ICD-10-CM

## 2022-08-08 DIAGNOSIS — E785 Hyperlipidemia, unspecified: Secondary | ICD-10-CM | POA: Diagnosis present

## 2022-08-08 DIAGNOSIS — Z1152 Encounter for screening for COVID-19: Secondary | ICD-10-CM

## 2022-08-08 DIAGNOSIS — Z9049 Acquired absence of other specified parts of digestive tract: Secondary | ICD-10-CM

## 2022-08-08 DIAGNOSIS — E079 Disorder of thyroid, unspecified: Secondary | ICD-10-CM | POA: Diagnosis present

## 2022-08-08 DIAGNOSIS — K56600 Partial intestinal obstruction, unspecified as to cause: Principal | ICD-10-CM | POA: Diagnosis present

## 2022-08-08 DIAGNOSIS — E876 Hypokalemia: Secondary | ICD-10-CM | POA: Diagnosis present

## 2022-08-08 DIAGNOSIS — E7800 Pure hypercholesterolemia, unspecified: Secondary | ICD-10-CM | POA: Diagnosis present

## 2022-08-08 DIAGNOSIS — Z7982 Long term (current) use of aspirin: Secondary | ICD-10-CM

## 2022-08-08 DIAGNOSIS — Z823 Family history of stroke: Secondary | ICD-10-CM

## 2022-08-08 DIAGNOSIS — Z8673 Personal history of transient ischemic attack (TIA), and cerebral infarction without residual deficits: Secondary | ICD-10-CM

## 2022-08-08 DIAGNOSIS — E871 Hypo-osmolality and hyponatremia: Secondary | ICD-10-CM | POA: Insufficient documentation

## 2022-08-08 DIAGNOSIS — I10 Essential (primary) hypertension: Secondary | ICD-10-CM | POA: Diagnosis present

## 2022-08-08 DIAGNOSIS — M810 Age-related osteoporosis without current pathological fracture: Secondary | ICD-10-CM | POA: Diagnosis present

## 2022-08-08 DIAGNOSIS — H409 Unspecified glaucoma: Secondary | ICD-10-CM | POA: Diagnosis present

## 2022-08-08 DIAGNOSIS — K3189 Other diseases of stomach and duodenum: Secondary | ICD-10-CM | POA: Insufficient documentation

## 2022-08-08 DIAGNOSIS — Z8249 Family history of ischemic heart disease and other diseases of the circulatory system: Secondary | ICD-10-CM

## 2022-08-08 DIAGNOSIS — Z66 Do not resuscitate: Secondary | ICD-10-CM | POA: Diagnosis present

## 2022-08-08 DIAGNOSIS — I4719 Other supraventricular tachycardia: Secondary | ICD-10-CM | POA: Diagnosis present

## 2022-08-08 LAB — CBC WITH DIFFERENTIAL/PLATELET
Abs Immature Granulocytes: 0.08 10*3/uL — ABNORMAL HIGH (ref 0.00–0.07)
Basophils Absolute: 0 10*3/uL (ref 0.0–0.1)
Basophils Relative: 0 %
Eosinophils Absolute: 0.1 10*3/uL (ref 0.0–0.5)
Eosinophils Relative: 1 %
HCT: 41.9 % (ref 36.0–46.0)
Hemoglobin: 14.1 g/dL (ref 12.0–15.0)
Immature Granulocytes: 1 %
Lymphocytes Relative: 5 %
Lymphs Abs: 0.8 10*3/uL (ref 0.7–4.0)
MCH: 31.6 pg (ref 26.0–34.0)
MCHC: 33.7 g/dL (ref 30.0–36.0)
MCV: 93.9 fL (ref 80.0–100.0)
Monocytes Absolute: 0.4 10*3/uL (ref 0.1–1.0)
Monocytes Relative: 2 %
Neutro Abs: 14.7 10*3/uL — ABNORMAL HIGH (ref 1.7–7.7)
Neutrophils Relative %: 91 %
Platelets: 296 10*3/uL (ref 150–400)
RBC: 4.46 MIL/uL (ref 3.87–5.11)
RDW: 12.9 % (ref 11.5–15.5)
WBC: 16 10*3/uL — ABNORMAL HIGH (ref 4.0–10.5)
nRBC: 0 % (ref 0.0–0.2)

## 2022-08-08 LAB — RESP PANEL BY RT-PCR (RSV, FLU A&B, COVID)  RVPGX2
Influenza A by PCR: NEGATIVE
Influenza B by PCR: NEGATIVE
Resp Syncytial Virus by PCR: NEGATIVE
SARS Coronavirus 2 by RT PCR: NEGATIVE

## 2022-08-08 LAB — COMPREHENSIVE METABOLIC PANEL
ALT: 10 U/L (ref 0–44)
AST: 19 U/L (ref 15–41)
Albumin: 4.4 g/dL (ref 3.5–5.0)
Alkaline Phosphatase: 104 U/L (ref 38–126)
Anion gap: 11 (ref 5–15)
BUN: 13 mg/dL (ref 8–23)
CO2: 27 mmol/L (ref 22–32)
Calcium: 11.1 mg/dL — ABNORMAL HIGH (ref 8.9–10.3)
Chloride: 95 mmol/L — ABNORMAL LOW (ref 98–111)
Creatinine, Ser: 0.59 mg/dL (ref 0.44–1.00)
GFR, Estimated: >60 mL/min (ref >60)
Glucose, Bld: 148 mg/dL — ABNORMAL HIGH (ref 70–99)
Potassium: 3.5 mmol/L (ref 3.5–5.1)
Sodium: 133 mmol/L — ABNORMAL LOW (ref 135–145)
Total Bilirubin: 0.6 mg/dL (ref 0.3–1.2)
Total Protein: 7.7 g/dL (ref 6.5–8.1)

## 2022-08-08 LAB — LIPASE, BLOOD: Lipase: 33 U/L (ref 11–51)

## 2022-08-08 MED ORDER — SODIUM CHLORIDE 0.9 % IV BOLUS
1000.0000 mL | Freq: Once | INTRAVENOUS | Status: AC
  Administered 2022-08-08: 1000 mL via INTRAVENOUS

## 2022-08-08 MED ORDER — ONDANSETRON HCL 4 MG/2ML IJ SOLN
4.0000 mg | Freq: Once | INTRAMUSCULAR | Status: AC
Start: 2022-08-08 — End: 2022-08-08
  Administered 2022-08-08: 4 mg via INTRAVENOUS
  Filled 2022-08-08: qty 2

## 2022-08-08 MED ORDER — IOHEXOL 300 MG/ML  SOLN
100.0000 mL | Freq: Once | INTRAMUSCULAR | Status: AC | PRN
  Administered 2022-08-08: 70 mL via INTRAVENOUS

## 2022-08-08 MED ORDER — LIDOCAINE HCL URETHRAL/MUCOSAL 2 % EX GEL
1.0000 | Freq: Once | CUTANEOUS | Status: AC
  Administered 2022-08-08: 1 via TOPICAL
  Filled 2022-08-08: qty 11

## 2022-08-08 NOTE — Telephone Encounter (Signed)
Caller states: - Pt started feeling nauseous and threw up once today  - Pt has burning sensation in back of throat that began last night  - No other symptoms present  - Has kept water down well   I was able to get patient scheduled for next available visit with Jarold Motto on 08/09/22 @ 3:40pm. Caller states that she wanted to make sure that the right steps were being taken due to patient's age. I offered for pt to speak to triage. Pt and caller agreed.   Pt has been transferred to triage.

## 2022-08-08 NOTE — Telephone Encounter (Addendum)
Patient is currently at [facility] ED.  Patient Name: Linda [name] Gender: Female DOB: 1924/02/04 Age: 88 Y 4 M 27 D Return Phone Number: [phone] (Primary), [phone] (Secondary) Address: City/ State/ Zip: [place]  [address] Client [facility] - [facility], [facility] Provider Jacquiline Doe- MD Contact Type Call Who Is Calling Patient / Member / Family / Caregiver Call Type Triage / Clinical Caller Name Melitta Tigue Relationship To Patient Daughter Return Phone Number [phone] (Primary) Chief Complaint Vomiting Reason for Call Symptomatic / Request for Health Information Initial Comment Caller states they are feeling nauseous, vomiting, can keep water down. Translation No Nurse Assessment Nurse: Lily Kocher, RN, Adriana Date/Time (Eastern Time): 08/08/2022 3:13:08 PM Confirm and document reason for call. If symptomatic, describe symptoms. ---caller states pt is nauseous and has thrown up twice. not feeling well. denies pain. has burning to throat. temp 96.2 oral Does the patient have any new or worsening symptoms? ---Yes Will a triage be completed? ---Yes Related visit to physician within the last 2 weeks? ---No Does the PT have any chronic conditions? (i.e. diabetes, asthma, this includes High risk factors for pregnancy, etc.) ---Yes List chronic conditions. ---htn heart disease stroke arthritis Is this a behavioral health or substance abuse call? ---No Guidelines Guideline Title Affirmed Question Affirmed Notes Nurse Date/Time (Eastern Time) Vomiting [1] Vomiting AND [2] contains red blood or black ("coffee ground") material (Exception: Few red streaks in vomit that only happened once.) Lily Kocher, RN, Adriana 08/08/2022 3:17:03 PM PLEASE NOTE: All timestamps contained within this report are represented as [place] Standard Time. CONFIDENTIALTY NOTICE: This fax transmission is  intended only for the addressee. It contains information that is legally privileged, confidential or otherwise protected from use or disclosure. If you are not the intended recipient, you are strictly prohibited from reviewing, disclosing, copying using or disseminating any of this information or taking any action in reliance on or regarding this information. If you have received this fax in error, please notify us immediately by telephone so that we can arrange for its return to [place]. Phone: [phone], Toll-Free: [phone], Fax: [phone] Page: 2 of 2 Call Id: [id number] Disp. Time Lamount Cohen Time) Disposition Final User 08/08/2022 3:21:04 PM Go to ED Now Yes Lily Kocher, RN, Adriana Final Disposition 08/08/2022 3:21:04 PM Go to ED Now Yes Lily Kocher, RN, Carlton Adam Disagree/Comply Comply Caller Understands Yes PreDisposition Call Doctor Care Advice Given Per Guideline GO TO ED NOW: CARE ADVICE per Vomiting (Adult) guideline. ANOTHER ADULT SHOULD DRIVE: * It is better and safer if another adult drives instead of you. Comments User: Jerilynn Birkenhead, RN Date/Time Lamount Cohen Time): 08/08/2022 3:22:13 PM during triage after denying blood or dark vomit she stated that the vomit was "iced tea colored with sediment" advised ED Referrals [facility] - ED

## 2022-08-08 NOTE — ED Provider Notes (Signed)
[facility] EMERGENCY DEPT Provider Note   CSN: [id number] Arrival date & time: 08/08/22  1543     History  Chief Complaint  Patient presents with   Emesis    Linda [name] is a 88 y.o. female.  Patient here with abdominal pain, nausea, vomiting.  History of appendectomy in the past.  History of high cholesterol hypertension.  She has had a lot of nausea and vomiting.  She not passing gas.  Belly feels distended.  Denies any sick contacts.  Denies any fevers or chills.  Nothing makes it worse or better.  No pain with urination.  History of bowel obstruction.  The history is provided by the patient.       Home Medications Prior to Admission medications   Medication Sig Start Date End Date Taking? Authorizing Provider  acetaminophen (TYLENOL) 500 MG tablet Take 1,000 mg by mouth every 6 (six) hours as needed.    [provider]  albuterol (VENTOLIN HFA) 108 (90 Base) MCG/ACT inhaler Inhale 1-2 puffs into the lungs every 6 (six) hours as needed for wheezing or shortness of breath. 05/24/22   [place], Betty G, MD  amLODipine (NORVASC) 2.5 MG tablet Take 1 tablet (2.5 mg total) by mouth daily. 12/12/21 12/07/22  Jodelle Red, MD  aspirin EC 81 MG EC tablet Take 1 tablet (81 mg total) by mouth daily. Swallow whole. 09/24/21   Kathlen Mody, MD  calcium carbonate (TUMS EX) 750 MG chewable tablet Chew 1 tablet by mouth as needed.    [provider]  Cholecalciferol (VITAMIN D3) 25 MCG (1000 UT) CAPS Take 1 capsule by mouth 2 (two) times daily.    [provider]  famotidine (PEPCID) 20 MG tablet Take 1 tablet (20 mg total) by mouth 2 (two) times daily. 10/17/21   Esterwood, Amy S, PA-C  INFANTS SIMETHICONE 20 MG/0.3ML drops 2 (two) times daily. 10/21/21   [provider]  lidocaine (LIDODERM) 5 % Place 1 patch onto the skin daily. 02/21/22   [provider]  mirtazapine (REMERON) 30 MG tablet Take 30 mg by mouth at bedtime.  03/24/22   [provider]  oxycodone-acetaminophen (LYNOX) 2.5-300 MG per tablet Take 1 tablet by mouth every 4 (four) hours as needed for pain.    [provider]  rosuvastatin (CRESTOR) 20 MG tablet TAKE 1 TABLET DAILY 03/13/22   Ardith Dark, MD  sotalol (BETAPACE) 80 MG tablet TAKE 1 TABLET EVERY 12 HOURS 06/13/22   Ardith Dark, MD  timolol (TIMOPTIC) 0.25 % ophthalmic solution Place 1 drop into both eyes daily. 02/22/19   [provider]  traMADol (ULTRAM) 50 MG tablet Take 0.5-1 tablets (25-50 mg total) by mouth every 6 (six) hours as needed. 06/02/22   Ardith Dark, MD      Allergies    Patient has no known allergies.    Review of Systems   Review of Systems  Physical Exam Updated Vital Signs BP (!) 146/62   Pulse 86   Temp 98 F (36.7 C) (Oral)   Resp 20   Ht 5\' 4"  (1.626 m)   Wt 53.1 kg   SpO2 96%   BMI 20.09 kg/m  Physical Exam Vitals and nursing note reviewed.  Constitutional:      General: She is not in acute distress.    Appearance: She is well-developed. She is not ill-appearing.  HENT:     Head: Normocephalic and atraumatic.     Mouth/Throat:  Mouth: Mucous membranes are moist.  Eyes:     Conjunctiva/sclera: Conjunctivae normal.     Pupils: Pupils are equal, round, and reactive to light.  Cardiovascular:     Rate and Rhythm: Normal rate and regular rhythm.     Pulses: Normal pulses.     Heart sounds: Normal heart sounds. No murmur heard. Pulmonary:     Effort: Pulmonary effort is normal. No respiratory distress.     Breath sounds: Normal breath sounds.  Abdominal:     General: There is distension.     Palpations: Abdomen is soft.     Tenderness: There is abdominal tenderness.  Musculoskeletal:        General: No swelling.     Cervical back: Neck supple.  Skin:    General: Skin is warm and dry.     Capillary Refill: Capillary refill takes less than 2 seconds.  Neurological:     Mental Status: She is alert.   Psychiatric:        Mood and Affect: Mood normal.     ED Results / Procedures / Treatments   Labs (all labs ordered are listed, but only abnormal results are displayed) Labs Reviewed  CBC WITH DIFFERENTIAL/PLATELET - Abnormal; Notable for the following components:      Result Value   WBC 16.0 (*)    Neutro Abs 14.7 (*)    Abs Immature Granulocytes 0.08 (*)    All other components within normal limits  COMPREHENSIVE METABOLIC PANEL - Abnormal; Notable for the following components:   Sodium 133 (*)    Chloride 95 (*)    Glucose, Bld 148 (*)    Calcium 11.1 (*)    All other components within normal limits  RESP PANEL BY RT-PCR (RSV, FLU A&B, COVID)  RVPGX2  LIPASE, BLOOD  URINALYSIS, ROUTINE W REFLEX MICROSCOPIC    EKG None  Radiology DG Abdomen 1 View  Result Date: 08/08/2022 CLINICAL DATA:  NG placement. EXAM: ABDOMEN - 1 VIEW COMPARISON:  CT abdomen pelvis dated 08/08/2022. FINDINGS: Enteric tube with tip in the left hemiabdomen, likely in the body of the stomach. Top-normal caliber air-filled small bowel in the upper abdomen measure up to 2.6 cm. IMPRESSION: Enteric tube with tip in the body of the stomach. Electronically Signed   By: Elgie Collard M.D.   On: 08/08/2022 23:31   CT ABDOMEN PELVIS W CONTRAST  Result Date: 08/08/2022 CLINICAL DATA:  Nausea, vomiting, and diarrhea EXAM: CT ABDOMEN AND PELVIS WITH CONTRAST TECHNIQUE: Multidetector CT imaging of the abdomen and pelvis was performed using the standard protocol following bolus administration of intravenous contrast. RADIATION DOSE REDUCTION: This exam was performed according to the departmental dose-optimization program which includes automated exposure control, adjustment of the mA and/or kV according to patient size and/or use of iterative reconstruction technique. CONTRAST:  1mL OMNIPAQUE IOHEXOL 300 MG/ML  SOLN COMPARISON:  05/26/2020 FINDINGS: Lower chest: No acute abnormality. Hepatobiliary: No focal liver  abnormality is seen. No gallstones, gallbladder wall thickening, or biliary dilatation. Pancreas: Unremarkable. No pancreatic ductal dilatation or surrounding inflammatory changes. Spleen: Normal in size without focal abnormality. Adrenals/Urinary Tract: Unremarkable adrenal glands. Decreased right pelvicaliectasis compared with 05/26/2020. Right-sided cortical renal scarring. Unremarkable bladder. Stomach/Bowel: Small hiatal hernia. Mild wall thickening of the distal esophagus. Unchanged 3.2 x 3.5 cm round heterogenous enhancing mass along the lesser curvature of the stomach (2/29). Multiple loops of dilated jejunum with abrupt transition in the posterior right hemiabdomen (series 5/image 33) in an area of small-bowel  wall thickening. There is fecalization of small bowel contents immediately upstream from the area of narrowing. The downstream distal small bowel is decompressed. The colon is nondistended. The appendix is not definitively visualized. Vascular/Lymphatic: Aortic atherosclerosis. No enlarged abdominal or pelvic lymph nodes. Reproductive: Calcified fibroid in the uterus. Other: Small amount of interloop free fluid in the right hemiabdomen in the pelvis. No free intraperitoneal air. Musculoskeletal: Thoracolumbar spondylosis. Superior endplate compression deformity of L3 is new since 2021 but favored chronic. Bilateral femoral head AVN. No acute osseous abnormality. IMPRESSION: 1. Moderate to high-grade small bowel obstruction with transition point in the right lower abdomen. 2. Heterogenous mass along the lesser curvature of the stomach is unchanged from 2021 and may represent a GI stromal tumor, less likely lymphoma. 3. Additional chronic findings as described. Electronically Signed   By: Minerva Fester M.D.   On: 08/08/2022 21:37    Procedures Procedures    Medications Ordered in ED Medications  sodium chloride 0.9 % bolus 1,000 mL (0 mLs Intravenous Stopped 08/08/22 2243)  ondansetron  (ZOFRAN) injection 4 mg (4 mg Intravenous Given 08/08/22 2105)  iohexol (OMNIPAQUE) 300 MG/ML solution 100 mL (70 mLs Intravenous Contrast Given 08/08/22 2108)  lidocaine (XYLOCAINE) 2 % jelly 1 Application (1 Application Topical Given 08/08/22 2218)    ED Course/ Medical Decision Making/ A&P                           Medical Decision Making Amount and/or Complexity of Data Reviewed Labs: ordered. Radiology: ordered.  Risk Prescription drug management. Decision regarding hospitalization.   Felita Requena is here with abdominal pain.  Normal vitals.  Well-appearing.  History of hypertension, high cholesterol.  She is got abdominal distention on exam.  Differential diagnosis likely viral process versus bowel obstruction versus UTI.  CT scan of the abdomen pelvis ordered.  Will give IV fluids, IV Zofran and get labs including CBC and CMP and lipase.  Per my review interpretation labs no significant anemia, electrolyte abnormality or kidney injury leukocytosis.  Urinalysis negative for infection.  CT scan shows small bowel obstruction with transition point.  Patient with negative flu and COVID test.  Talked with Dr. Royanne Foots general surgery.  Will place an NG tube.  Patient admitted to medicine for further care.  This chart was dictated using voice recognition software.  Despite best efforts to proofread,  errors can occur which can change the documentation meaning.         Final Clinical Impression(s) / ED Diagnoses Final diagnoses:  SBO (small bowel obstruction) [facility])    Rx / DC Orders ED Discharge Orders     None         Virgina Norfolk, DO 08/08/22 2347

## 2022-08-08 NOTE — ED Notes (Signed)
Patient transported to CT 

## 2022-08-08 NOTE — ED Triage Notes (Signed)
Patient here POV from Home.  Endorses N/V/D that began Last PM. No Known Fevers.   NAD Noted during Triage. A&Ox4. GCS 15. Ambulatory.

## 2022-08-09 ENCOUNTER — Ambulatory Visit: Payer: Medicare Other | Admitting: Physician Assistant

## 2022-08-09 ENCOUNTER — Inpatient Hospital Stay (HOSPITAL_COMMUNITY): Payer: Medicare Other

## 2022-08-09 DIAGNOSIS — K56609 Unspecified intestinal obstruction, unspecified as to partial versus complete obstruction: Secondary | ICD-10-CM | POA: Diagnosis present

## 2022-08-09 DIAGNOSIS — M810 Age-related osteoporosis without current pathological fracture: Secondary | ICD-10-CM | POA: Diagnosis present

## 2022-08-09 DIAGNOSIS — Z8673 Personal history of transient ischemic attack (TIA), and cerebral infarction without residual deficits: Secondary | ICD-10-CM | POA: Diagnosis not present

## 2022-08-09 DIAGNOSIS — Z823 Family history of stroke: Secondary | ICD-10-CM | POA: Diagnosis not present

## 2022-08-09 DIAGNOSIS — H409 Unspecified glaucoma: Secondary | ICD-10-CM | POA: Diagnosis present

## 2022-08-09 DIAGNOSIS — Z8249 Family history of ischemic heart disease and other diseases of the circulatory system: Secondary | ICD-10-CM | POA: Diagnosis not present

## 2022-08-09 DIAGNOSIS — Z9049 Acquired absence of other specified parts of digestive tract: Secondary | ICD-10-CM | POA: Diagnosis not present

## 2022-08-09 DIAGNOSIS — J449 Chronic obstructive pulmonary disease, unspecified: Secondary | ICD-10-CM | POA: Diagnosis present

## 2022-08-09 DIAGNOSIS — E079 Disorder of thyroid, unspecified: Secondary | ICD-10-CM | POA: Diagnosis present

## 2022-08-09 DIAGNOSIS — Z79899 Other long term (current) drug therapy: Secondary | ICD-10-CM | POA: Diagnosis not present

## 2022-08-09 DIAGNOSIS — Z7982 Long term (current) use of aspirin: Secondary | ICD-10-CM | POA: Diagnosis not present

## 2022-08-09 DIAGNOSIS — K3189 Other diseases of stomach and duodenum: Secondary | ICD-10-CM | POA: Insufficient documentation

## 2022-08-09 DIAGNOSIS — E7800 Pure hypercholesterolemia, unspecified: Secondary | ICD-10-CM | POA: Diagnosis present

## 2022-08-09 DIAGNOSIS — K319 Disease of stomach and duodenum, unspecified: Secondary | ICD-10-CM | POA: Diagnosis present

## 2022-08-09 DIAGNOSIS — Z1152 Encounter for screening for COVID-19: Secondary | ICD-10-CM | POA: Diagnosis not present

## 2022-08-09 DIAGNOSIS — Z87891 Personal history of nicotine dependence: Secondary | ICD-10-CM | POA: Diagnosis not present

## 2022-08-09 DIAGNOSIS — D72829 Elevated white blood cell count, unspecified: Secondary | ICD-10-CM | POA: Insufficient documentation

## 2022-08-09 DIAGNOSIS — K56600 Partial intestinal obstruction, unspecified as to cause: Secondary | ICD-10-CM | POA: Diagnosis present

## 2022-08-09 DIAGNOSIS — E871 Hypo-osmolality and hyponatremia: Secondary | ICD-10-CM | POA: Insufficient documentation

## 2022-08-09 DIAGNOSIS — Z66 Do not resuscitate: Secondary | ICD-10-CM | POA: Diagnosis present

## 2022-08-09 DIAGNOSIS — I4719 Other supraventricular tachycardia: Secondary | ICD-10-CM | POA: Diagnosis present

## 2022-08-09 DIAGNOSIS — E876 Hypokalemia: Secondary | ICD-10-CM | POA: Diagnosis present

## 2022-08-09 DIAGNOSIS — I10 Essential (primary) hypertension: Secondary | ICD-10-CM | POA: Diagnosis present

## 2022-08-09 LAB — GLUCOSE, CAPILLARY
Glucose-Capillary: 138 mg/dL — ABNORMAL HIGH (ref 70–99)
Glucose-Capillary: 127 mg/dL — ABNORMAL HIGH (ref 70–99)
Glucose-Capillary: 130 mg/dL — ABNORMAL HIGH (ref 70–99)

## 2022-08-09 LAB — COMPREHENSIVE METABOLIC PANEL
ALT: 13 U/L (ref 0–44)
AST: 25 U/L (ref 15–41)
Albumin: 3.4 g/dL — ABNORMAL LOW (ref 3.5–5.0)
Alkaline Phosphatase: 85 U/L (ref 38–126)
Anion gap: 8 (ref 5–15)
BUN: 14 mg/dL (ref 8–23)
CO2: 25 mmol/L (ref 22–32)
Calcium: 9.8 mg/dL (ref 8.9–10.3)
Chloride: 104 mmol/L (ref 98–111)
Creatinine, Ser: 0.61 mg/dL (ref 0.44–1.00)
GFR, Estimated: >60 mL/min (ref >60)
Glucose, Bld: 127 mg/dL — ABNORMAL HIGH (ref 70–99)
Potassium: 3.5 mmol/L (ref 3.5–5.1)
Sodium: 137 mmol/L (ref 135–145)
Total Bilirubin: 0.8 mg/dL (ref 0.3–1.2)
Total Protein: 6.5 g/dL (ref 6.5–8.1)

## 2022-08-09 LAB — URINALYSIS, ROUTINE W REFLEX MICROSCOPIC
Bilirubin Urine: NEGATIVE
Glucose, UA: NEGATIVE mg/dL
Ketones, ur: 15 mg/dL — AB
Leukocytes,Ua: NEGATIVE
Nitrite: NEGATIVE
Protein, ur: NEGATIVE mg/dL
Specific Gravity, Urine: <=1.005 (ref 1.005–1.030)
pH: 6 (ref 5.0–8.0)

## 2022-08-09 LAB — CBC
HCT: 38.8 % (ref 36.0–46.0)
Hemoglobin: 12.8 g/dL (ref 12.0–15.0)
MCH: 31.6 pg (ref 26.0–34.0)
MCHC: 33 g/dL (ref 30.0–36.0)
MCV: 95.8 fL (ref 80.0–100.0)
Platelets: 245 10*3/uL (ref 150–400)
RBC: 4.05 MIL/uL (ref 3.87–5.11)
RDW: 13 % (ref 11.5–15.5)
WBC: 10.2 10*3/uL (ref 4.0–10.5)
nRBC: 0 % (ref 0.0–0.2)

## 2022-08-09 LAB — VITAMIN D 25 HYDROXY (VIT D DEFICIENCY, FRACTURES): Vit D, 25-Hydroxy: 40.82 ng/mL (ref 30–100)

## 2022-08-09 MED ORDER — ONDANSETRON HCL 4 MG/2ML IJ SOLN
4.0000 mg | Freq: Four times a day (QID) | INTRAMUSCULAR | Status: DC | PRN
  Administered 2022-08-09: 4 mg via INTRAVENOUS
  Filled 2022-08-09: qty 2

## 2022-08-09 MED ORDER — LIDOCAINE 5 % EX PTCH
1.0000 | MEDICATED_PATCH | Freq: Every day | CUTANEOUS | Status: DC
  Administered 2022-08-09 – 2022-08-13 (×5): 1 via TRANSDERMAL
  Filled 2022-08-09 (×6): qty 1

## 2022-08-09 MED ORDER — LABETALOL HCL 5 MG/ML IV SOLN
5.0000 mg | INTRAVENOUS | Status: DC | PRN
  Filled 2022-08-09: qty 4

## 2022-08-09 MED ORDER — METOPROLOL TARTRATE 5 MG/5ML IV SOLN: 5.0000 mg | INTRAVENOUS | Status: DC | PRN

## 2022-08-09 MED ORDER — DEXTROSE 50 % IV SOLN
12.5000 g | INTRAVENOUS | Status: DC | PRN
  Administered 2022-08-11 – 2022-08-12 (×2): 12.5 g via INTRAVENOUS
  Filled 2022-08-09 (×2): qty 50

## 2022-08-09 MED ORDER — SODIUM CHLORIDE 0.9 % IV SOLN: INTRAVENOUS | Status: DC

## 2022-08-09 MED ORDER — DIATRIZOATE MEGLUMINE & SODIUM 66-10 % PO SOLN
90.0000 mL | Freq: Once | ORAL | Status: AC
  Administered 2022-08-09: 90 mL via NASOGASTRIC
  Filled 2022-08-09: qty 90

## 2022-08-09 MED ORDER — LIDOCAINE 5 % EX PTCH
1.0000 | MEDICATED_PATCH | Freq: Every day | CUTANEOUS | Status: DC
  Filled 2022-08-09: qty 1

## 2022-08-09 MED ORDER — ACETAMINOPHEN 650 MG RE SUPP: 650.0000 mg | Freq: Four times a day (QID) | RECTAL | Status: DC | PRN

## 2022-08-09 MED ORDER — TIMOLOL MALEATE 0.25 % OP SOLN
1.0000 [drp] | Freq: Every day | OPHTHALMIC | Status: DC
  Administered 2022-08-09 – 2022-08-14 (×6): 1 [drp] via OPHTHALMIC
  Filled 2022-08-09: qty 5

## 2022-08-09 MED ORDER — PANTOPRAZOLE SODIUM 40 MG IV SOLR
40.0000 mg | Freq: Every day | INTRAVENOUS | Status: DC
  Administered 2022-08-09 – 2022-08-12 (×4): 40 mg via INTRAVENOUS
  Filled 2022-08-09 (×4): qty 10

## 2022-08-09 MED ORDER — MORPHINE SULFATE (PF) 2 MG/ML IV SOLN: 1.0000 mg | INTRAVENOUS | Status: DC | PRN

## 2022-08-09 MED ORDER — ACETAMINOPHEN 325 MG PO TABS: 650.0000 mg | ORAL_TABLET | Freq: Four times a day (QID) | ORAL | Status: DC | PRN

## 2022-08-09 MED ORDER — DEXTROSE-NACL 5-0.9 % IV SOLN: INTRAVENOUS | Status: DC

## 2022-08-09 MED ORDER — NALOXONE HCL 0.4 MG/ML IJ SOLN: 0.4000 mg | INTRAMUSCULAR | Status: DC | PRN

## 2022-08-09 NOTE — TOC Initial Note (Signed)
Transition of Care [facility]) - Initial/Assessment Note   Patient Details  Name: Linda [name] MRN: [id number] Date of Birth: Dec 06, 1923  Transition of Care [facility]) CM/SW Contact:    Ewing Schlein, LCSW Phone Number: 08/09/2022, 1:38 PM  Clinical Narrative: CSW spoke with patient's daughter, Linda [name], and confirmed patient is a resident at [facility]. CSW spoke with Tobi Bastos at [facility] regarding discharge planning. Per Tobi Bastos, she will need to be called on the day of discharge [phone]) and the [facility] and discharge summary will need to be faxed to [phone]. FL2 started.  Expected Discharge Plan: Assisted Living Barriers to Discharge: Continued Medical Work up  Patient Goals and CMS Choice Patient states their goals for this hospitalization and ongoing recovery are:: Return to [facility]  Expected Discharge Plan and Services In-house Referral: Clinical Social Work Living arrangements for the past 2 months: Assisted Living Facility           DME Arranged: N/A DME Agency: NA  Prior Living Arrangements/Services Living arrangements for the past 2 months: Assisted Living Facility Lives with:: Facility Resident Patient language and need for interpreter reviewed:: Yes Do you feel safe going back to the place where you live?: Yes      Need for Family Participation in Patient Care: Yes (Comment) Care giver support system in place?: Yes (comment) Criminal Activity/Legal Involvement Pertinent to Current Situation/Hospitalization: No - Comment as needed  Activities of Daily Living Home Assistive Devices/Equipment: Eyeglasses, Hearing aid ADL Screening (condition at time of admission) Patient's cognitive ability adequate to safely complete daily activities?: Yes Is the patient deaf or have difficulty hearing?: No Does the patient have difficulty seeing, even when wearing glasses/contacts?: No Does the patient have difficulty concentrating, remembering, or making decisions?:  No Patient able to express need for assistance with ADLs?: Yes Does the patient have difficulty dressing or bathing?: No Independently performs ADLs?: Yes (appropriate for developmental age) Does the patient have difficulty walking or climbing stairs?: Yes Weakness of Legs: Left Weakness of Arms/Hands: None  Permission Sought/Granted Permission sought to share information with : Facility Industrial/product designer granted to share information with : Yes, Verbal Permission Granted Permission granted to share info w AGENCY: [facility]  Emotional Assessment Orientation: : Oriented to Self, Oriented to Place, Oriented to  Time Alcohol / Substance Use: Not Applicable Psych Involvement: No (comment)  Admission diagnosis:  SBO (small bowel obstruction) (HCC) [K56.609] Patient Active Problem List   Diagnosis Date Noted   Mass of stomach 08/09/2022   Leukocytosis 08/09/2022   Hyponatremia 08/09/2022   SBO (small bowel obstruction) (HCC) 08/08/2022   Trochanteric bursitis, left hip 06/30/2022   Insomnia 05/18/2022   Lumbar compression fracture (HCC) 01/25/2022   Cerebral thrombosis with cerebral infarction 09/22/2021   Hypokalemia 09/21/2021   Chronic diastolic CHF (congestive heart failure) (HCC) 09/21/2021   COPD (chronic obstructive pulmonary disease) (HCC) 09/21/2021   Osteoarthritis 08/12/2021   Dermatitis 04/13/2021   Multinodular goiter 11/19/2020   Hypercalcemia 11/19/2020   Hyperparathyroidism (HCC) 11/19/2020   Bronchiectasis without acute exacerbation (HCC) 07/14/2019   Chronic cough 02/03/2019   Thoracic back pain 10/13/2018   Hyperlipidemia 10/13/2018   Vitamin D deficiency 10/13/2018   Gastroesophageal reflux disease without esophagitis 10/13/2018   Chronic midline thoracic back pain 10/13/2018   Osteoporosis without current pathological fracture 10/13/2018   HTN (hypertension) 10/13/2018   Stenosis of carotid artery 10/13/2018   History of thyroid  nodule, s/p bx, benign 10/13/2018   Aortic cusp  regurgitation 10/13/2018   Nonrheumatic tricuspid valve regurgitation 10/13/2018   Coccyalgia 10/13/2018   Irritable bowel syndrome 10/13/2018   Bilateral hearing loss 10/13/2018   Chronic rhinitis 10/13/2018   PAT (paroxysmal atrial tachycardia) 10/13/2018   PCP:  Ardith Dark, MD Pharmacy:   [facility] DELIVERY - [place], [place] - [address] [place] [address] Phone: [phone] Fax: [phone]  [facility] [address], [place] - [address] AT [facility] [address] [facility] [address] & [facility] [address] [address] Phone: [phone] Fax: [phone]  Social Determinants of Health (SDOH) Social History: SDOH Screenings   Food Insecurity: No Food Insecurity (08/09/2022)  Housing: Low Risk  (08/09/2022)  Transportation Needs: No Transportation Needs (08/09/2022)  Utilities: Not At Risk (08/09/2022)  Depression (PHQ2-9): Low Risk  (08/12/2021)  Financial Resource Strain: Low Risk  (08/12/2021)  Physical Activity: Inactive (08/12/2021)  Social Connections: Socially Isolated (08/12/2021)  Stress: No Stress Concern Present (08/12/2021)  Tobacco Use: Medium Risk (08/08/2022)   SDOH Interventions: N/A  Readmission Risk Interventions     No data to display

## 2022-08-09 NOTE — H&P (Signed)
History and Physical    Linda [name] GMW:[id number] DOB: [birth year] DOA: 08/08/2022  PCP: Ardith Dark, MD  Patient coming from: [facility] ED  Chief Complaint: Abdominal pain  HPI: Linda [name] is a 88 y.o. female with medical history significant of hypertension, hyperlipidemia, COPD, CVA, paroxysmal atrial tachycardia, appendectomy presented to ED with abdominal pain and distention, nausea, and vomiting.  Afebrile.  Labs significant for WBC count 16.0, sodium 133, calcium 11.1, albumin 4.4, lipase and LFTs normal.  CT showing moderate to high-grade SBO with transition point in the right lower abdomen.  There is a heterogenous mass along the lesser curvature of the stomach which is unchanged from 2021 and per radiologist may represent a GI stromal tumor, less likely lymphoma. General surgery consulted (Dr. Royanne Foots) and NG tube placed.  Patient received Zofran and 1 L normal saline bolus.  Transferred to [facility].  History provided by the patient and her daughter at bedside.  She was doing well until Christmas but the following day started having abdominal pain, nausea, and vomiting.  She has not been able to tolerate any p.o. intake.  Patient thinks her last bowel movement was 2 or 3 days ago, she is not sure.  Daughter is aware of patient having a mass in her stomach which was seen on previous imaging but states patient had an endoscopy done within the past 4 years and no stomach mass was reported.  Patient has no other complaints.  Denies cough, shortness of breath, or chest pain.  Review of Systems:  Review of Systems  All other systems reviewed and are negative.   Past Medical History:  Diagnosis Date   Arthritis    COPD (chronic obstructive pulmonary disease) (HCC)    chronic cough   Glaucoma    Hyperlipidemia    Hypertension    Irregular heartbeat    Osteoporosis    Thyroid disease    enlarged thyroid    Past Surgical History:  Procedure Laterality Date    APPENDECTOMY     cateract surgery     COLONOSCOPY     Done in [place]  yrs ago   UPPER GASTROINTESTINAL ENDOSCOPY  2019   WRIST SURGERY       reports that she quit smoking about 72 years ago. Her smoking use included cigarettes. She started smoking about 74 years ago. She has a 0.50 pack-year smoking history. She has been exposed to tobacco smoke. She has never used smokeless tobacco. She reports that she does not currently use alcohol. She reports that she does not use drugs.  No Known Allergies  Family History  Problem Relation Age of Onset   Stroke Mother    Heart disease Mother    Heart disease Father    Heart disease Brother    Heart disease Brother    Colon cancer Neg Hx    Colon polyps Neg Hx    Esophageal cancer Neg Hx     Prior to Admission medications   Medication Sig Start Date End Date Taking? Authorizing Provider  acetaminophen (TYLENOL) 500 MG tablet Take 1,000 mg by mouth every 6 (six) hours as needed.    [provider]  albuterol (VENTOLIN HFA) 108 (90 Base) MCG/ACT inhaler Inhale 1-2 puffs into the lungs every 6 (six) hours as needed for wheezing or shortness of breath. 05/24/22   [place], Betty G, MD  amLODipine (NORVASC) 2.5 MG tablet Take 1 tablet (2.5 mg total) by mouth daily. 12/12/21 12/07/22  Jodelle Red, MD  aspirin EC 81 MG EC tablet Take 1 tablet (81 mg total) by mouth daily. Swallow whole. 09/24/21   Hosie Poisson, MD  calcium carbonate (TUMS EX) 750 MG chewable tablet Chew 1 tablet by mouth as needed.    [provider]  Cholecalciferol (VITAMIN D3) 25 MCG (1000 UT) CAPS Take 1 capsule by mouth 2 (two) times daily.    [provider]  famotidine (PEPCID) 20 MG tablet Take 1 tablet (20 mg total) by mouth 2 (two) times daily. 10/17/21   Esterwood, Amy S, PA-C  INFANTS SIMETHICONE 20 MG/0.3ML drops 2 (two) times daily. 10/21/21   [provider]  lidocaine (LIDODERM) 5 % Place 1 patch onto the skin daily. 02/21/22    [provider]  mirtazapine (REMERON) 30 MG tablet Take 30 mg by mouth at bedtime. 03/24/22   [provider]  oxycodone-acetaminophen (LYNOX) 2.5-300 MG per tablet Take 1 tablet by mouth every 4 (four) hours as needed for pain.    [provider]  rosuvastatin (CRESTOR) 20 MG tablet TAKE 1 TABLET DAILY 03/13/22   Vivi Barrack, MD  sotalol (BETAPACE) 80 MG tablet TAKE 1 TABLET EVERY 12 HOURS 06/13/22   Vivi Barrack, MD  timolol (TIMOPTIC) 0.25 % ophthalmic solution Place 1 drop into both eyes daily. 02/22/19   [provider]  traMADol (ULTRAM) 50 MG tablet Take 0.5-1 tablets (25-50 mg total) by mouth every 6 (six) hours as needed. 06/02/22   Vivi Barrack, MD    Physical Exam: Vitals:   08/08/22 2100 08/08/22 2200 08/08/22 2245 08/08/22 2347  BP: (!) 148/58 (!) 146/62  (!) 164/66  Pulse: 75  86 87  Resp: 17 20 20 18   Temp:    98.6 F (37 C)  TempSrc:    Oral  SpO2: 96%  96% 100%  Weight:      Height:        Physical Exam Vitals reviewed.  Constitutional:      General: She is not in acute distress. HENT:     Head: Normocephalic and atraumatic.  Eyes:     Extraocular Movements: Extraocular movements intact.  Cardiovascular:     Rate and Rhythm: Normal rate and regular rhythm.     Pulses: Normal pulses.  Pulmonary:     Effort: Pulmonary effort is normal. No respiratory distress.     Breath sounds: Normal breath sounds. No wheezing or rales.  Abdominal:     General: Bowel sounds are normal. There is distension.     Palpations: Abdomen is soft.     Tenderness: There is abdominal tenderness. There is no guarding or rebound.  Musculoskeletal:     Cervical back: Normal range of motion.     Right lower leg: No edema.     Left lower leg: No edema.  Skin:    General: Skin is warm and dry.  Neurological:     General: No focal deficit present.     Mental Status: She is alert and oriented to person, place, and time.     Labs on  Admission: I have personally reviewed following labs and imaging studies  CBC: Recent Labs  Lab 08/08/22 1715  WBC 16.0*  NEUTROABS 14.7*  HGB 14.1  HCT 41.9  MCV 93.9  PLT 0000000   Basic Metabolic Panel: Recent Labs  Lab 08/08/22 1715  NA 133*  K 3.5  CL 95*  CO2 27  GLUCOSE 148*  BUN 13  CREATININE 0.59  CALCIUM 11.1*  GFR: Estimated Creatinine Clearance: 34.5 mL/min (by C-G formula based on SCr of 0.59 mg/dL). Liver Function Tests: Recent Labs  Lab 08/08/22 1715  AST 19  ALT 10  ALKPHOS 104  BILITOT 0.6  PROT 7.7  ALBUMIN 4.4   Recent Labs  Lab 08/08/22 1715  LIPASE 33   No results for input(s): "AMMONIA" in the last 168 hours. Coagulation Profile: No results for input(s): "INR", "PROTIME" in the last 168 hours. Cardiac Enzymes: No results for input(s): "CKTOTAL", "CKMB", "CKMBINDEX", "TROPONINI" in the last 168 hours. BNP (last 3 results) No results for input(s): "PROBNP" in the last 8760 hours. HbA1C: No results for input(s): "HGBA1C" in the last 72 hours. CBG: No results for input(s): "GLUCAP" in the last 168 hours. Lipid Profile: No results for input(s): "CHOL", "HDL", "LDLCALC", "TRIG", "CHOLHDL", "LDLDIRECT" in the last 72 hours. Thyroid Function Tests: No results for input(s): "TSH", "T4TOTAL", "FREET4", "T3FREE", "THYROIDAB" in the last 72 hours. Anemia Panel: No results for input(s): "VITAMINB12", "FOLATE", "FERRITIN", "TIBC", "IRON", "RETICCTPCT" in the last 72 hours. Urine analysis:    Component Value Date/Time   COLORURINE YELLOW 09/19/2021 1528   APPEARANCEUR CLEAR 09/19/2021 1528   LABSPEC <1.005 (L) 09/19/2021 1528   PHURINE 5.5 09/19/2021 1528   GLUCOSEU NEGATIVE 09/19/2021 1528   GLUCOSEU NEGATIVE 11/18/2020 1218   HGBUR SMALL (A) 09/19/2021 1528   BILIRUBINUR NEGATIVE 09/19/2021 1528   KETONESUR NEGATIVE 09/19/2021 1528   PROTEINUR NEGATIVE 09/19/2021 1528   UROBILINOGEN 0.2 11/18/2020 1218   NITRITE NEGATIVE 09/19/2021  1528   LEUKOCYTESUR TRACE (A) 09/19/2021 1528    Radiological Exams on Admission: DG Abdomen 1 View  Result Date: 08/08/2022 CLINICAL DATA:  NG placement. EXAM: ABDOMEN - 1 VIEW COMPARISON:  CT abdomen pelvis dated 08/08/2022. FINDINGS: Enteric tube with tip in the left hemiabdomen, likely in the body of the stomach. Top-normal caliber air-filled small bowel in the upper abdomen measure up to 2.6 cm. IMPRESSION: Enteric tube with tip in the body of the stomach. Electronically Signed   By: Anner Crete M.D.   On: 08/08/2022 23:31   CT ABDOMEN PELVIS W CONTRAST  Result Date: 08/08/2022 CLINICAL DATA:  Nausea, vomiting, and diarrhea EXAM: CT ABDOMEN AND PELVIS WITH CONTRAST TECHNIQUE: Multidetector CT imaging of the abdomen and pelvis was performed using the standard protocol following bolus administration of intravenous contrast. RADIATION DOSE REDUCTION: This exam was performed according to the departmental dose-optimization program which includes automated exposure control, adjustment of the mA and/or kV according to patient size and/or use of iterative reconstruction technique. CONTRAST:  38mL OMNIPAQUE IOHEXOL 300 MG/ML  SOLN COMPARISON:  05/26/2020 FINDINGS: Lower chest: No acute abnormality. Hepatobiliary: No focal liver abnormality is seen. No gallstones, gallbladder wall thickening, or biliary dilatation. Pancreas: Unremarkable. No pancreatic ductal dilatation or surrounding inflammatory changes. Spleen: Normal in size without focal abnormality. Adrenals/Urinary Tract: Unremarkable adrenal glands. Decreased right pelvicaliectasis compared with 05/26/2020. Right-sided cortical renal scarring. Unremarkable bladder. Stomach/Bowel: Small hiatal hernia. Mild wall thickening of the distal esophagus. Unchanged 3.2 x 3.5 cm round heterogenous enhancing mass along the lesser curvature of the stomach (2/29). Multiple loops of dilated jejunum with abrupt transition in the posterior right hemiabdomen  (series 5/image 33) in an area of small-bowel wall thickening. There is fecalization of small bowel contents immediately upstream from the area of narrowing. The downstream distal small bowel is decompressed. The colon is nondistended. The appendix is not definitively visualized. Vascular/Lymphatic: Aortic atherosclerosis. No enlarged abdominal or pelvic lymph nodes. Reproductive: Calcified fibroid  in the uterus. Other: Small amount of interloop free fluid in the right hemiabdomen in the pelvis. No free intraperitoneal air. Musculoskeletal: Thoracolumbar spondylosis. Superior endplate compression deformity of L3 is new since 2021 but favored chronic. Bilateral femoral head AVN. No acute osseous abnormality. IMPRESSION: 1. Moderate to high-grade small bowel obstruction with transition point in the right lower abdomen. 2. Heterogenous mass along the lesser curvature of the stomach is unchanged from 2021 and may represent a GI stromal tumor, less likely lymphoma. 3. Additional chronic findings as described. Electronically Signed   By: Placido Sou M.D.   On: 08/08/2022 21:37    Assessment and Plan  SBO Patient presenting with pain and distention, nausea, and vomiting.  History of appendectomy.  CT showing moderate to high-grade SBO with transition point in the right lower abdomen. -Keep n.p.o. -General surgery consulted -NG tube in place -Continue IV fluid hydration -Pain management  Stomach mass CT showing a stomach mass which is stable since imaging done in October 2021 and per radiologist may represent a GI stromal tumor, less likely lymphoma. EGD done in November 2021 showing grade B esophagitis but no stomach mass lesions seen.  Discussed CT findings with the patient and her daughter. -Outpatient follow-up  Leukocytosis Likely reactive.  Patient is afebrile.  No infectious signs or symptoms. -UA pending -Repeat CBC in a.m.  Mild hyponatremia -IV fluid hydration and continue to monitor  labs  Mild hypercalcemia -IV fluid hydration and continue to monitor labs -Check PTH and vitamin D level  Hypertension: Systolic currently in the 160s.  IV labetalol PRN. Hyperlipidemia COPD: Stable, no signs of acute exacerbation. History of paroxysmal atrial tachycardia: Cardiac monitoring History of CVA -Pharmacy med rec pending.  DVT prophylaxis: SCDs Code Status: DNR/DNI (discussed with the patient and her daughter) Family Communication: Daughter at bedside. Consults called: General surgery Level of care: Telemetry bed Admission status: It is my clinical opinion that admission to INPATIENT is reasonable and necessary because of the expectation that this patient will require hospital care that crosses at least 2 midnights to treat this condition based on the medical complexity of the problems presented.  Given the aforementioned information, the predictability of an adverse outcome is felt to be significant.   Shela Leff MD [facility]  If 7PM-7AM, please contact night-coverage [email or web address]  08/09/2022, 12:01 AM

## 2022-08-09 NOTE — Progress Notes (Signed)
  PROGRESS NOTE  Patient admitted earlier this morning. See H&P.   Linda [name] is a 88 y.o. female with medical history significant of hypertension, hyperlipidemia, COPD, CVA, paroxysmal atrial tachycardia, appendectomy presented to ED with abdominal pain and distention, nausea, and vomiting. CT showed moderate to high-grade SBO with transition point in the right lower abdomen.  There is a heterogenous mass along the lesser curvature of the stomach which is unchanged from 2021 and per radiologist may represent a GI stromal tumor, less likely lymphoma. General surgery consulted and NG tube placed.    Patient seen and examined today.  Continues to have some nausea.  NG tube is in place.  Daughter is at bedside.  SBO -General surgery consulted -N.p.o., NG tube, IV fluid  Stomach mass -CT showing a stomach mass which is stable since imaging done in October 2021 and per radiologist may represent a GI stromal tumor, less likely lymphoma. EGD done in November 2021 showing grade B esophagitis but no stomach mass lesions seen.   Hypertension -Hold norvasc while NPO  Hyperlipidemia -Hold crestor while NPO  Paroxysmal atrial tachycardia -Hold sotalol while NPO  History CVA -Hold aspirin while NPO  Status is: Inpatient Remains inpatient appropriate because: SBO   Noralee Stain, DO [facility] 08/09/2022, 12:26 PM  Available via Epic secure chat 7am-7pm After these hours, please refer to coverage provider listed on [email or web address]

## 2022-08-09 NOTE — Consult Note (Addendum)
[facility] [birth year]  [id number].    Requesting MD: Maylene Roes, MD Chief Complaint/Reason for Consult: SBO  HPI:  Linda [name] is at 88 y/o F with PMH HTN and HLD who presented to [place] last night with a cc abdominal pain. Patients daughter is at bedside reporting most of her history. Pain started 12/26 - daughter says her mother called her not feeling well. Patient is unable to describe her pain but feels unwell and reports nausea, vomiting, and abnormal bowel movements. She thinks her last BM was 12/24 or 12/25. Per daughter, her mother has been vomiting and was unable to keep even liquids down, prompting them to seek medical attention. Denies fever, chills, chest pain, SOB, urinary sxs, melena, or hematochezia. Daughter says the patient had a stroke one year ago and was placed on a blood thinner for a few weeks. They recommended the patient wear a cardiac monitor to look for a.fib but the patient declined.  Surgical history: appendectomy 40 years ago - perforated. Required laparotomy. Blood thinners: none, tother than 81 mg ASA daily Substance/other social history: former smoker - quit in the 1950's, denies alcohol or other drug use. Lives at an assisted living facility ([facility]). Patient tells me she mobilizes with a walker in her apartment.   Work up included a CT of the abdomen which revealed a SBO with a transition point in the RLQ and the patient was transferred to [facility] for admission and management.  ROS: As above Review of Systems  All other systems reviewed and are negative.   Family History  Problem Relation Age of Onset   Stroke Mother    Heart disease Mother    Heart disease Father    Heart disease Brother    Heart disease Brother    Colon cancer Neg Hx    Colon polyps Neg Hx    Esophageal cancer Neg Hx     Past Medical History:  Diagnosis Date   Arthritis    COPD (chronic obstructive pulmonary disease) (HCC)    chronic cough    Glaucoma    Hyperlipidemia    Hypertension    Irregular heartbeat    Osteoporosis    Thyroid disease    enlarged thyroid    Past Surgical History:  Procedure Laterality Date   APPENDECTOMY     cateract surgery     COLONOSCOPY     Done in [place]  yrs ago   UPPER GASTROINTESTINAL ENDOSCOPY  2019   WRIST SURGERY      Social History:  reports that she quit smoking about 72 years ago. Her smoking use included cigarettes. She started smoking about 74 years ago. She has a 0.50 pack-year smoking history. She has been exposed to tobacco smoke. She has never used smokeless tobacco. She reports that she does not currently use alcohol. She reports that she does not use drugs.  Allergies: No Known Allergies  Medications Prior to Admission  Medication Sig Dispense Refill   acetaminophen (TYLENOL) 500 MG tablet Take 1,000 mg by mouth every 6 (six) hours as needed.     albuterol (VENTOLIN HFA) 108 (90 Base) MCG/ACT inhaler Inhale 1-2 puffs into the lungs every 6 (six) hours as needed for wheezing or shortness of breath. 8 g 0   amLODipine (NORVASC) 2.5 MG tablet Take 1 tablet (2.5 mg total) by mouth daily. 90 tablet 3   aspirin EC 81 MG EC tablet Take 1 tablet (81 mg total) by mouth  daily. Swallow whole. 30 tablet 11   calcium carbonate (TUMS EX) 750 MG chewable tablet Chew 1 tablet by mouth as needed.     Cholecalciferol (VITAMIN D3) 25 MCG (1000 UT) CAPS Take 1 capsule by mouth 2 (two) times daily.     famotidine (PEPCID) 20 MG tablet Take 1 tablet (20 mg total) by mouth 2 (two) times daily. 180 tablet 3   INFANTS SIMETHICONE 20 MG/0.3ML drops 2 (two) times daily.     lidocaine (LIDODERM) 5 % Place 1 patch onto the skin daily.     mirtazapine (REMERON) 30 MG tablet Take 30 mg by mouth at bedtime.     oxycodone-acetaminophen (LYNOX) 2.5-300 MG per tablet Take 1 tablet by mouth every 4 (four) hours as needed for pain.     rosuvastatin (CRESTOR) 20 MG tablet TAKE 1 TABLET DAILY 90 tablet 3    sotalol (BETAPACE) 80 MG tablet TAKE 1 TABLET EVERY 12 HOURS 180 tablet 3   timolol (TIMOPTIC) 0.25 % ophthalmic solution Place 1 drop into both eyes daily.     traMADol (ULTRAM) 50 MG tablet Take 0.5-1 tablets (25-50 mg total) by mouth every 6 (six) hours as needed. 30 tablet 5     Physical Exam: Blood pressure (!) 153/59, pulse 91, temperature 97.6 F (36.4 C), temperature source Oral, resp. rate 17, height 5\' 4"  (1.626 m), weight 53.1 kg, SpO2 95 %. General: Elderly white female sitting on hospital bed, somnolent but easily arouses, NAD. HEENT: head -normocephalic, atraumatic; Eyes: PERRLA, anicteric sclerae  Neck- Trachea is midline, no thyromegaly CV- RRR, no lower extremity edema Pulm- breathing is non-labored ORA. CTABL, no wheezes, rhales, rhonchi. Abd- soft, moderately distended but soft, non-tender, NG in place with thick feculent effluent in tubing. 600 cc in cannister. GU- deferred  MSK- UE/LE symmetrical, no cyanosis, clubbing, or edema. Neuro- nonfocal exam, gait not assessed Psych- Alert and Oriented x3 with appropriate affect Skin: warm and dry, no rashes or lesions   Results for orders placed or performed during the hospital encounter of 08/08/22 (from the past 48 hour(s))  CBC with Differential     Status: Abnormal   Collection Time: 08/08/22  5:15 PM  Result Value Ref Range   WBC 16.0 (H) 4.0 - 10.5 K/uL   RBC 4.46 3.87 - 5.11 MIL/uL   Hemoglobin 14.1 12.0 - 15.0 g/dL   HCT 41.9 36.0 - 46.0 %   MCV 93.9 80.0 - 100.0 fL   MCH 31.6 26.0 - 34.0 pg   MCHC 33.7 30.0 - 36.0 g/dL   RDW 12.9 11.5 - 15.5 %   Platelets 296 150 - 400 K/uL   nRBC 0.0 0.0 - 0.2 %   Neutrophils Relative % 91 %   Neutro Abs 14.7 (H) 1.7 - 7.7 K/uL   Lymphocytes Relative 5 %   Lymphs Abs 0.8 0.7 - 4.0 K/uL   Monocytes Relative 2 %   Monocytes Absolute 0.4 0.1 - 1.0 K/uL   Eosinophils Relative 1 %   Eosinophils Absolute 0.1 0.0 - 0.5 K/uL   Basophils Relative 0 %   Basophils Absolute  0.0 0.0 - 0.1 K/uL   Immature Granulocytes 1 %   Abs Immature Granulocytes 0.08 (H) 0.00 - 0.07 K/uL    Comment: Performed at [facility], [address], [place], [place] [address]  Comprehensive metabolic panel     Status: Abnormal   Collection Time: 08/08/22  5:15 PM  Result Value Ref Range   Sodium 133 (L)  135 - 145 mmol/L   Potassium 3.5 3.5 - 5.1 mmol/L   Chloride 95 (L) 98 - 111 mmol/L   CO2 27 22 - 32 mmol/L   Glucose, Bld 148 (H) 70 - 99 mg/dL    Comment: Glucose reference range applies only to samples taken after fasting for at least 8 hours.   BUN 13 8 - 23 mg/dL   Creatinine, Ser 0.59 0.44 - 1.00 mg/dL   Calcium 11.1 (H) 8.9 - 10.3 mg/dL   Total Protein 7.7 6.5 - 8.1 g/dL   Albumin 4.4 3.5 - 5.0 g/dL   AST 19 15 - 41 U/L   ALT 10 0 - 44 U/L   Alkaline Phosphatase 104 38 - 126 U/L   Total Bilirubin 0.6 0.3 - 1.2 mg/dL   GFR, Estimated >60 >60 mL/min    Comment: (NOTE) Calculated using the CKD-EPI Creatinine Equation (2021)    Anion gap 11 5 - 15    Comment: Performed at [facility], [address], [place], [place] [address]  Lipase, blood     Status: None   Collection Time: 08/08/22  5:15 PM  Result Value Ref Range   Lipase 33 11 - 51 U/L    Comment: Performed at [facility], [address], [place], [place] [address]  Resp panel by RT-PCR (RSV, Flu A&B, Covid) Anterior Nasal Swab     Status: None   Collection Time: 08/08/22  5:15 PM   Specimen: Anterior Nasal Swab  Result Value Ref Range   SARS Coronavirus 2 by RT PCR NEGATIVE NEGATIVE    Comment: (NOTE) SARS-CoV-2 target nucleic acids are NOT DETECTED.  The SARS-CoV-2 RNA is generally detectable in upper respiratory specimens during the acute phase of infection. The lowest concentration of SARS-CoV-2 viral copies this assay can detect is 138 copies/mL. A negative result does not preclude SARS-Cov-2 infection and should not be used as the sole  basis for treatment or other patient management decisions. A negative result may occur with  improper specimen collection/handling, submission of specimen other than nasopharyngeal swab, presence of viral mutation(s) within the areas targeted by this assay, and inadequate number of viral copies(<138 copies/mL). A negative result must be combined with clinical observations, patient history, and epidemiological information. The expected result is Negative.  Fact Sheet for Patients:  EntrepreneurPulse.com.au  Fact Sheet for Healthcare Providers:  IncredibleEmployment.be  This test is no t yet approved or cleared by the [place] FDA and  has been authorized for detection and/or diagnosis of SARS-CoV-2 by FDA under an Emergency Use Authorization (EUA). This EUA will remain  in effect (meaning this test can be used) for the duration of the COVID-19 declaration under Section 564(b)(1) of the Act, 21 U.S.C.section 360bbb-3(b)(1), unless the authorization is terminated  or revoked sooner.       Influenza A by PCR NEGATIVE NEGATIVE   Influenza B by PCR NEGATIVE NEGATIVE    Comment: (NOTE) The Xpert Xpress SARS-CoV-2/FLU/RSV plus assay is intended as an aid in the diagnosis of influenza from Nasopharyngeal swab specimens and should not be used as a sole basis for treatment. Nasal washings and aspirates are unacceptable for Xpert Xpress SARS-CoV-2/FLU/RSV testing.  Fact Sheet for Patients: EntrepreneurPulse.com.au  Fact Sheet for Healthcare Providers: IncredibleEmployment.be  This test is not yet approved or cleared by the [place] FDA and has been authorized for detection and/or diagnosis of SARS-CoV-2 by FDA under an Emergency Use Authorization (EUA). This EUA will remain in effect (meaning  this test can be used) for the duration of the COVID-19 declaration under Section 564(b)(1) of the Act, 21  U.S.C. section 360bbb-3(b)(1), unless the authorization is terminated or revoked.     Resp Syncytial Virus by PCR NEGATIVE NEGATIVE    Comment: (NOTE) Fact Sheet for Patients: EntrepreneurPulse.com.au  Fact Sheet for Healthcare Providers: IncredibleEmployment.be  This test is not yet approved or cleared by the [place] FDA and has been authorized for detection and/or diagnosis of SARS-CoV-2 by FDA under an Emergency Use Authorization (EUA). This EUA will remain in effect (meaning this test can be used) for the duration of the COVID-19 declaration under Section 564(b)(1) of the Act, 21 U.S.C. section 360bbb-3(b)(1), unless the authorization is terminated or revoked.  Performed at [facility], [address], [place], [place] [address]   Urinalysis, Routine w reflex microscopic Urine, Clean Catch     Status: Abnormal   Collection Time: 08/09/22  1:10 AM  Result Value Ref Range   Color, Urine YELLOW YELLOW   APPearance CLEAR CLEAR   Specific Gravity, Urine <=1.005 1.005 - 1.030   pH 6.0 5.0 - 8.0   Glucose, UA NEGATIVE NEGATIVE mg/dL   Hgb urine dipstick SMALL (A) NEGATIVE   Bilirubin Urine NEGATIVE NEGATIVE   Ketones, ur 15 (A) NEGATIVE mg/dL   Protein, ur NEGATIVE NEGATIVE mg/dL   Nitrite NEGATIVE NEGATIVE   Leukocytes,Ua NEGATIVE NEGATIVE   RBC / HPF 0-5 0 - 5 RBC/hpf   WBC, UA 0-5 0 - 5 WBC/hpf   Bacteria, UA RARE (A) NONE SEEN   Squamous Epithelial / LPF 0-5 0 - 5 /HPF   Mucus PRESENT     Comment: Performed at [facility], [place] [address] [place], [place] [address]  Glucose, capillary     Status: Abnormal   Collection Time: 08/09/22  1:57 AM  Result Value Ref Range   Glucose-Capillary 138 (H) 70 - 99 mg/dL    Comment: Glucose reference range applies only to samples taken after fasting for at least 8 hours.  Glucose, capillary     Status: Abnormal   Collection Time: 08/09/22  6:12 AM   Result Value Ref Range   Glucose-Capillary 127 (H) 70 - 99 mg/dL    Comment: Glucose reference range applies only to samples taken after fasting for at least 8 hours.  CBC     Status: None   Collection Time: 08/09/22  6:19 AM  Result Value Ref Range   WBC 10.2 4.0 - 10.5 K/uL   RBC 4.05 3.87 - 5.11 MIL/uL   Hemoglobin 12.8 12.0 - 15.0 g/dL   HCT 38.8 36.0 - 46.0 %   MCV 95.8 80.0 - 100.0 fL   MCH 31.6 26.0 - 34.0 pg   MCHC 33.0 30.0 - 36.0 g/dL   RDW 13.0 11.5 - 15.5 %   Platelets 245 150 - 400 K/uL   nRBC 0.0 0.0 - 0.2 %    Comment: Performed at [facility], [place] [address] [place], [place] [address]  Comprehensive metabolic panel     Status: Abnormal   Collection Time: 08/09/22  6:19 AM  Result Value Ref Range   Sodium 137 135 - 145 mmol/L   Potassium 3.5 3.5 - 5.1 mmol/L   Chloride 104 98 - 111 mmol/L   CO2 25 22 - 32 mmol/L   Glucose, Bld 127 (H) 70 - 99 mg/dL    Comment: Glucose reference range applies only to samples taken after fasting for  at least 8 hours.   BUN 14 8 - 23 mg/dL   Creatinine, Ser 0.97 0.44 - 1.00 mg/dL   Calcium 9.8 8.9 - 35.3 mg/dL   Total Protein 6.5 6.5 - 8.1 g/dL   Albumin 3.4 (L) 3.5 - 5.0 g/dL   AST 25 15 - 41 U/L   ALT 13 0 - 44 U/L   Alkaline Phosphatase 85 38 - 126 U/L   Total Bilirubin 0.8 0.3 - 1.2 mg/dL   GFR, Estimated >29 >92 mL/min    Comment: (NOTE) Calculated using the CKD-EPI Creatinine Equation (2021)    Anion gap 8 5 - 15    Comment: Performed at [facility], [address], [place], [place] [address]   DG Abdomen 1 View  Result Date: 08/08/2022 CLINICAL DATA:  NG placement. EXAM: ABDOMEN - 1 VIEW COMPARISON:  CT abdomen pelvis dated 08/08/2022. FINDINGS: Enteric tube with tip in the left hemiabdomen, likely in the body of the stomach. Top-normal caliber air-filled small bowel in the upper abdomen measure up to 2.6 cm. IMPRESSION: Enteric tube with tip in the body of the stomach.  Electronically Signed   By: Elgie Collard M.D.   On: 08/08/2022 23:31   CT ABDOMEN PELVIS W CONTRAST  Result Date: 08/08/2022 CLINICAL DATA:  Nausea, vomiting, and diarrhea EXAM: CT ABDOMEN AND PELVIS WITH CONTRAST TECHNIQUE: Multidetector CT imaging of the abdomen and pelvis was performed using the standard protocol following bolus administration of intravenous contrast. RADIATION DOSE REDUCTION: This exam was performed according to the departmental dose-optimization program which includes automated exposure control, adjustment of the mA and/or kV according to patient size and/or use of iterative reconstruction technique. CONTRAST:  106mL OMNIPAQUE IOHEXOL 300 MG/ML  SOLN COMPARISON:  05/26/2020 FINDINGS: Lower chest: No acute abnormality. Hepatobiliary: No focal liver abnormality is seen. No gallstones, gallbladder wall thickening, or biliary dilatation. Pancreas: Unremarkable. No pancreatic ductal dilatation or surrounding inflammatory changes. Spleen: Normal in size without focal abnormality. Adrenals/Urinary Tract: Unremarkable adrenal glands. Decreased right pelvicaliectasis compared with 05/26/2020. Right-sided cortical renal scarring. Unremarkable bladder. Stomach/Bowel: Small hiatal hernia. Mild wall thickening of the distal esophagus. Unchanged 3.2 x 3.5 cm round heterogenous enhancing mass along the lesser curvature of the stomach (2/29). Multiple loops of dilated jejunum with abrupt transition in the posterior right hemiabdomen (series 5/image 33) in an area of small-bowel wall thickening. There is fecalization of small bowel contents immediately upstream from the area of narrowing. The downstream distal small bowel is decompressed. The colon is nondistended. The appendix is not definitively visualized. Vascular/Lymphatic: Aortic atherosclerosis. No enlarged abdominal or pelvic lymph nodes. Reproductive: Calcified fibroid in the uterus. Other: Small amount of interloop free fluid in the right  hemiabdomen in the pelvis. No free intraperitoneal air. Musculoskeletal: Thoracolumbar spondylosis. Superior endplate compression deformity of L3 is new since 2021 but favored chronic. Bilateral femoral head AVN. No acute osseous abnormality. IMPRESSION: 1. Moderate to high-grade small bowel obstruction with transition point in the right lower abdomen. 2. Heterogenous mass along the lesser curvature of the stomach is unchanged from 2021 and may represent a GI stromal tumor, less likely lymphoma. 3. Additional chronic findings as described. Electronically Signed   By: Minerva Fester M.D.   On: 08/08/2022 21:37      Assessment/Plan SBO, likely due to adhesions from previous open appendectomy  -afebrile, VSS, WBC 10 from 16 yesterday - no emergent surgical needs, recommend SBO protocol today after another hour of decompression. - NG LIWS, ok to clamp  temporarily to allow pt to mobilize  - hopefully she will improve with non-operative measures. Given her age, history of CVA, and incomplete cardiac workup one year ago, surgery would be at least moderate risk and she likely would not return to her current baseline post-operatively. Failure to improve with non-operative measures would warrant GOC discussion and likely cardiac eval.  - IV Protonix ordered, pt takes Pepcid at home - re-ordered home lidoderm patch and timolol. Tylenol per rectum PRN. Avoid meds per tube for now.   FEN - NPO, NG LIWS VTE - SCD's, ok for DVT ppx  ID - none indicated  Admit - to [facility] service, we will follow closely.   HTN HLD Glaucoma - home timolol re-ordered  Arthritis   I reviewed nursing notes, ED provider notes, hospitalist notes, last 24 h vitals and pain scores, last 48 h intake and output, last 24 h labs and trends, and last 24 h imaging results.  Jill Alexanders, [facility] Surgery 08/09/2022, 7:51 AM Please see Amion for pager number during day hours 7:00am-4:30pm or 7:00am -11:30am on  weekends

## 2022-08-09 NOTE — H&P (Incomplete)
History and Physical    Linda [name] NOM:[id number] DOB: 06-Nov-1923 DOA: 08/08/2022  PCP: Ardith Dark, MD  Patient coming from: [facility] ED  Chief Complaint: Abdominal pain  HPI: Linda [name] is a 88 y.o. female with medical history significant of hypertension, hyperlipidemia, COPD, appendectomy presented to ED with abdominal pain and distention, nausea, and vomiting.  Afebrile.  Labs significant for WBC count  General surgery consulted (Dr. Royanne Foots) and NG tube placed.  Patient received Zofran and 1 L normal saline bolus.  Transferred to [facility].  ED Course: ***  Review of Systems:  ROS  Past Medical History:  Diagnosis Date  . Arthritis   . COPD (chronic obstructive pulmonary disease) (HCC)    chronic cough  . Glaucoma   . Hyperlipidemia   . Hypertension   . Irregular heartbeat   . Osteoporosis   . Thyroid disease    enlarged thyroid    Past Surgical History:  Procedure Laterality Date  . APPENDECTOMY    . cateract surgery    . COLONOSCOPY     Done in [place]  yrs ago  . UPPER GASTROINTESTINAL ENDOSCOPY  2019  . WRIST SURGERY       reports that she quit smoking about 72 years ago. Her smoking use included cigarettes. She started smoking about 74 years ago. She has a 0.50 pack-year smoking history. She has been exposed to tobacco smoke. She has never used smokeless tobacco. She reports that she does not currently use alcohol. She reports that she does not use drugs.  No Known Allergies  Family History  Problem Relation Age of Onset  . Stroke Mother   . Heart disease Mother   . Heart disease Father   . Heart disease Brother   . Heart disease Brother   . Colon cancer Neg Hx   . Colon polyps Neg Hx   . Esophageal cancer Neg Hx     Prior to Admission medications   Medication Sig Start Date End Date Taking? Authorizing Provider  acetaminophen (TYLENOL) 500 MG tablet Take 1,000 mg by mouth every 6 (six) hours as needed.    [provider]   albuterol (VENTOLIN HFA) 108 (90 Base) MCG/ACT inhaler Inhale 1-2 puffs into the lungs every 6 (six) hours as needed for wheezing or shortness of breath. 05/24/22   [place], Betty G, MD  amLODipine (NORVASC) 2.5 MG tablet Take 1 tablet (2.5 mg total) by mouth daily. 12/12/21 12/07/22  Jodelle Red, MD  aspirin EC 81 MG EC tablet Take 1 tablet (81 mg total) by mouth daily. Swallow whole. 09/24/21   Kathlen Mody, MD  calcium carbonate (TUMS EX) 750 MG chewable tablet Chew 1 tablet by mouth as needed.    [provider]  Cholecalciferol (VITAMIN D3) 25 MCG (1000 UT) CAPS Take 1 capsule by mouth 2 (two) times daily.    [provider]  famotidine (PEPCID) 20 MG tablet Take 1 tablet (20 mg total) by mouth 2 (two) times daily. 10/17/21   Esterwood, Amy S, PA-C  INFANTS SIMETHICONE 20 MG/0.3ML drops 2 (two) times daily. 10/21/21   [provider]  lidocaine (LIDODERM) 5 % Place 1 patch onto the skin daily. 02/21/22   [provider]  mirtazapine (REMERON) 30 MG tablet Take 30 mg by mouth at bedtime. 03/24/22   [provider]  oxycodone-acetaminophen (LYNOX) 2.5-300 MG per tablet Take 1 tablet by mouth every 4 (four) hours as needed for pain.    [provider]  rosuvastatin (CRESTOR) 20 MG tablet TAKE 1 TABLET DAILY 03/13/22   Ardith Dark, MD  sotalol (BETAPACE) 80 MG tablet TAKE 1 TABLET EVERY 12 HOURS 06/13/22   Ardith Dark, MD  timolol (TIMOPTIC) 0.25 % ophthalmic solution Place 1 drop into both eyes daily. 02/22/19   [provider]  traMADol (ULTRAM) 50 MG tablet Take 0.5-1 tablets (25-50 mg total) by mouth every 6 (six) hours as needed. 06/02/22   Ardith Dark, MD    Physical Exam: Vitals:   08/08/22 2100 08/08/22 2200 08/08/22 2245 08/08/22 2347  BP: (!) 148/58 (!) 146/62  (!) 164/66  Pulse: 75  86 87  Resp: 17 20 20 18   Temp:    98.6 F (37 C)  TempSrc:    Oral  SpO2: 96%  96% 100%  Weight:      Height:         Physical Exam  Labs on Admission: I have personally reviewed following labs and imaging studies  CBC: Recent Labs  Lab 08/08/22 1715  WBC 16.0*  NEUTROABS 14.7*  HGB 14.1  HCT 41.9  MCV 93.9  PLT 296   Basic Metabolic Panel: Recent Labs  Lab 08/08/22 1715  NA 133*  K 3.5  CL 95*  CO2 27  GLUCOSE 148*  BUN 13  CREATININE 0.59  CALCIUM 11.1*   GFR: Estimated Creatinine Clearance: 34.5 mL/min (by C-G formula based on SCr of 0.59 mg/dL). Liver Function Tests: Recent Labs  Lab 08/08/22 1715  AST 19  ALT 10  ALKPHOS 104  BILITOT 0.6  PROT 7.7  ALBUMIN 4.4   Recent Labs  Lab 08/08/22 1715  LIPASE 33   No results for input(s): "AMMONIA" in the last 168 hours. Coagulation Profile: No results for input(s): "INR", "PROTIME" in the last 168 hours. Cardiac Enzymes: No results for input(s): "CKTOTAL", "CKMB", "CKMBINDEX", "TROPONINI" in the last 168 hours. BNP (last 3 results) No results for input(s): "PROBNP" in the last 8760 hours. HbA1C: No results for input(s): "HGBA1C" in the last 72 hours. CBG: No results for input(s): "GLUCAP" in the last 168 hours. Lipid Profile: No results for input(s): "CHOL", "HDL", "LDLCALC", "TRIG", "CHOLHDL", "LDLDIRECT" in the last 72 hours. Thyroid Function Tests: No results for input(s): "TSH", "T4TOTAL", "FREET4", "T3FREE", "THYROIDAB" in the last 72 hours. Anemia Panel: No results for input(s): "VITAMINB12", "FOLATE", "FERRITIN", "TIBC", "IRON", "RETICCTPCT" in the last 72 hours. Urine analysis:    Component Value Date/Time   COLORURINE YELLOW 09/19/2021 1528   APPEARANCEUR CLEAR 09/19/2021 1528   LABSPEC <1.005 (L) 09/19/2021 1528   PHURINE 5.5 09/19/2021 1528   GLUCOSEU NEGATIVE 09/19/2021 1528   GLUCOSEU NEGATIVE 11/18/2020 1218   HGBUR SMALL (A) 09/19/2021 1528   BILIRUBINUR NEGATIVE 09/19/2021 1528   KETONESUR NEGATIVE 09/19/2021 1528   PROTEINUR NEGATIVE 09/19/2021 1528   UROBILINOGEN 0.2 11/18/2020 1218    NITRITE NEGATIVE 09/19/2021 1528   LEUKOCYTESUR TRACE (A) 09/19/2021 1528    Radiological Exams on Admission: DG Abdomen 1 View  Result Date: 08/08/2022 CLINICAL DATA:  NG placement. EXAM: ABDOMEN - 1 VIEW COMPARISON:  CT abdomen pelvis dated 08/08/2022. FINDINGS: Enteric tube with tip in the left hemiabdomen, likely in the body of the stomach. Top-normal caliber air-filled small bowel in the upper abdomen measure up to 2.6 cm. IMPRESSION: Enteric tube with tip in the body of the stomach. Electronically Signed   By: 08/10/2022 M.D.   On: 08/08/2022 23:31   CT ABDOMEN PELVIS W CONTRAST  Result  Date: 08/08/2022 CLINICAL DATA:  Nausea, vomiting, and diarrhea EXAM: CT ABDOMEN AND PELVIS WITH CONTRAST TECHNIQUE: Multidetector CT imaging of the abdomen and pelvis was performed using the standard protocol following bolus administration of intravenous contrast. RADIATION DOSE REDUCTION: This exam was performed according to the departmental dose-optimization program which includes automated exposure control, adjustment of the mA and/or kV according to patient size and/or use of iterative reconstruction technique. CONTRAST:  81mL OMNIPAQUE IOHEXOL 300 MG/ML  SOLN COMPARISON:  05/26/2020 FINDINGS: Lower chest: No acute abnormality. Hepatobiliary: No focal liver abnormality is seen. No gallstones, gallbladder wall thickening, or biliary dilatation. Pancreas: Unremarkable. No pancreatic ductal dilatation or surrounding inflammatory changes. Spleen: Normal in size without focal abnormality. Adrenals/Urinary Tract: Unremarkable adrenal glands. Decreased right pelvicaliectasis compared with 05/26/2020. Right-sided cortical renal scarring. Unremarkable bladder. Stomach/Bowel: Small hiatal hernia. Mild wall thickening of the distal esophagus. Unchanged 3.2 x 3.5 cm round heterogenous enhancing mass along the lesser curvature of the stomach (2/29). Multiple loops of dilated jejunum with abrupt transition in the  posterior right hemiabdomen (series 5/image 33) in an area of small-bowel wall thickening. There is fecalization of small bowel contents immediately upstream from the area of narrowing. The downstream distal small bowel is decompressed. The colon is nondistended. The appendix is not definitively visualized. Vascular/Lymphatic: Aortic atherosclerosis. No enlarged abdominal or pelvic lymph nodes. Reproductive: Calcified fibroid in the uterus. Other: Small amount of interloop free fluid in the right hemiabdomen in the pelvis. No free intraperitoneal air. Musculoskeletal: Thoracolumbar spondylosis. Superior endplate compression deformity of L3 is new since 2021 but favored chronic. Bilateral femoral head AVN. No acute osseous abnormality. IMPRESSION: 1. Moderate to high-grade small bowel obstruction with transition point in the right lower abdomen. 2. Heterogenous mass along the lesser curvature of the stomach is unchanged from 2021 and may represent a GI stromal tumor, less likely lymphoma. 3. Additional chronic findings as described. Electronically Signed   By: Minerva Fester M.D.   On: 08/08/2022 21:37    EKG: Independently reviewed. ***  Assessment and Plan  DVT prophylaxis: {Blank single:19197::"Lovenox","SQ Heparin","IV heparin gtt","Xarelto","Eliquis","Coumadin","SCDs","***"} Code Status: {Blank single:19197::"Full Code","DNR","DNR/DNI","Comfort Care","***"} Family Communication: ***  Consults called: ***  Level of care: {Blank single:19197::"Med-Surg","Telemetry bed","Progressive Care Unit","Step Down Unit"} Admission status: ***  John Giovanni MD [facility]  If 7PM-7AM, please contact night-coverage [email or web address]  08/09/2022, 12:01 AM

## 2022-08-10 ENCOUNTER — Inpatient Hospital Stay (HOSPITAL_COMMUNITY): Payer: Medicare Other

## 2022-08-10 DIAGNOSIS — K56609 Unspecified intestinal obstruction, unspecified as to partial versus complete obstruction: Secondary | ICD-10-CM | POA: Diagnosis not present

## 2022-08-10 LAB — BASIC METABOLIC PANEL
Anion gap: 9 (ref 5–15)
BUN: 17 mg/dL (ref 8–23)
CO2: 27 mmol/L (ref 22–32)
Calcium: 10 mg/dL (ref 8.9–10.3)
Chloride: 104 mmol/L (ref 98–111)
Creatinine, Ser: 0.5 mg/dL (ref 0.44–1.00)
GFR, Estimated: >60 mL/min (ref >60)
Glucose, Bld: 108 mg/dL — ABNORMAL HIGH (ref 70–99)
Potassium: 2.9 mmol/L — ABNORMAL LOW (ref 3.5–5.1)
Sodium: 140 mmol/L (ref 135–145)

## 2022-08-10 LAB — CBC
HCT: 41 % (ref 36.0–46.0)
Hemoglobin: 13.5 g/dL (ref 12.0–15.0)
MCH: 31.6 pg (ref 26.0–34.0)
MCHC: 32.9 g/dL (ref 30.0–36.0)
MCV: 96 fL (ref 80.0–100.0)
Platelets: 255 10*3/uL (ref 150–400)
RBC: 4.27 MIL/uL (ref 3.87–5.11)
RDW: 13.2 % (ref 11.5–15.5)
WBC: 9.4 10*3/uL (ref 4.0–10.5)
nRBC: 0 % (ref 0.0–0.2)

## 2022-08-10 LAB — GLUCOSE, CAPILLARY
Glucose-Capillary: 105 mg/dL — ABNORMAL HIGH (ref 70–99)
Glucose-Capillary: 121 mg/dL — ABNORMAL HIGH (ref 70–99)
Glucose-Capillary: 74 mg/dL (ref 70–99)
Glucose-Capillary: 86 mg/dL (ref 70–99)
Glucose-Capillary: 99 mg/dL (ref 70–99)

## 2022-08-10 LAB — PARATHYROID HORMONE, INTACT (NO CA): PTH: 38 pg/mL (ref 15–65)

## 2022-08-10 MED ORDER — METOPROLOL TARTRATE 5 MG/5ML IV SOLN
5.0000 mg | Freq: Four times a day (QID) | INTRAVENOUS | Status: DC
  Administered 2022-08-10 – 2022-08-12 (×9): 5 mg via INTRAVENOUS
  Filled 2022-08-10 (×10): qty 5

## 2022-08-10 MED ORDER — POTASSIUM CHLORIDE 10 MEQ/100ML IV SOLN
10.0000 meq | INTRAVENOUS | Status: AC
  Administered 2022-08-10 (×6): 10 meq via INTRAVENOUS
  Filled 2022-08-10: qty 100

## 2022-08-10 NOTE — Progress Notes (Signed)
Mobility Specialist - Progress Note   08/10/22 1451  Mobility  Activity Ambulated with assistance in hallway  Level of Assistance Contact guard assist, steadying assist  Assistive Device Front wheel walker  Distance Ambulated (ft) 160 ft  Activity Response Tolerated well  Mobility Referral Yes  $Mobility charge 1 Mobility   Pt received in bed and agreeable to mobility. No complaints during ambulation. Pt to bed after session with all needs met.    [place]

## 2022-08-10 NOTE — Progress Notes (Signed)
SBO (small bowel obstruction) (HCC)  Subjective: Contrast in colon, high NG output  Objective: Vital signs in last 24 hours: Temp:  [97.6 F (36.4 C)-98.7 F (37.1 C)] 98.7 F (37.1 C) (12/28 0345) Pulse Rate:  [77-87] 87 (12/28 0345) Resp:  [16-18] 18 (12/28 0345) BP: (157-193)/(53-80) 193/80 (12/28 0345) SpO2:  [93 %-95 %] 94 % (12/28 0345) Last BM Date : 08/09/22  Intake/Output from previous day: 12/27 0701 - 12/28 0700 In: 2792.8 [P.O.:120; I.V.:2552.8; NG/GT:120] Out: 2050 [Emesis/NG output:2050] Intake/Output this shift: No intake/output data recorded.  General appearance: alert and cooperative GI: soft, min distended  Lab Results:  Results for orders placed or performed during the hospital encounter of 08/08/22 (from the past 24 hour(s))  Glucose, capillary     Status: Abnormal   Collection Time: 08/09/22  5:24 PM  Result Value Ref Range   Glucose-Capillary 130 (H) 70 - 99 mg/dL  Glucose, capillary     Status: Abnormal   Collection Time: 08/10/22 12:09 AM  Result Value Ref Range   Glucose-Capillary 121 (H) 70 - 99 mg/dL  CBC     Status: None   Collection Time: 08/10/22  5:46 AM  Result Value Ref Range   WBC 9.4 4.0 - 10.5 K/uL   RBC 4.27 3.87 - 5.11 MIL/uL   Hemoglobin 13.5 12.0 - 15.0 g/dL   HCT 41.0 36.0 - 46.0 %   MCV 96.0 80.0 - 100.0 fL   MCH 31.6 26.0 - 34.0 pg   MCHC 32.9 30.0 - 36.0 g/dL   RDW 13.2 11.5 - 15.5 %   Platelets 255 150 - 400 K/uL   nRBC 0.0 0.0 - 0.2 %  Basic metabolic panel     Status: Abnormal   Collection Time: 08/10/22  5:46 AM  Result Value Ref Range   Sodium 140 135 - 145 mmol/L   Potassium 2.9 (L) 3.5 - 5.1 mmol/L   Chloride 104 98 - 111 mmol/L   CO2 27 22 - 32 mmol/L   Glucose, Bld 108 (H) 70 - 99 mg/dL   BUN 17 8 - 23 mg/dL   Creatinine, Ser 0.50 0.44 - 1.00 mg/dL   Calcium 10.0 8.9 - 10.3 mg/dL   GFR, Estimated >60 >60 mL/min   Anion gap 9 5 - 15  Glucose, capillary     Status: Abnormal   Collection Time: 08/10/22   6:11 AM  Result Value Ref Range   Glucose-Capillary 105 (H) 70 - 99 mg/dL     Studies/Results Radiology     MEDS, Scheduled  lidocaine  1 patch Transdermal QHS   metoprolol tartrate  5 mg Intravenous Q6H   pantoprazole (PROTONIX) IV  40 mg Intravenous QHS   timolol  1 drop Both Eyes Daily     Assessment: SBO (small bowel obstruction) ([place])   Plan: Given high NG output, will leave to NG to suction today  LOS: 1 day    Rosario Adie, MD [facility]  Patient's medical decision making was straightforward (25 mins met or exceeded with patient care and documentation).   08/10/2022 8:38 AM

## 2022-08-10 NOTE — Plan of Care (Signed)

## 2022-08-10 NOTE — Progress Notes (Signed)
PROGRESS NOTE    Linda [name]  DDU:[id number] DOB: Mar 25, 1924 DOA: 08/08/2022 PCP: Ardith Dark, MD     Brief Narrative:  Linda [name] is a 88 y.o. female with medical history significant of hypertension, hyperlipidemia, COPD, CVA, paroxysmal atrial tachycardia, appendectomy presented to ED with abdominal pain and distention, nausea, and vomiting. CT showed moderate to high-grade SBO with transition point in the right lower abdomen.  There is a heterogenous mass along the lesser curvature of the stomach which is unchanged from 2021 and per radiologist may represent a GI stromal tumor, less likely lymphoma. General surgery consulted and NG tube placed.    New events last 24 hours / Subjective: Feeling bloated, no flatus/stool.   Assessment & Plan:  Principal Problem:   SBO (small bowel obstruction) (HCC) Active Problems:   Hyperlipidemia   HTN (hypertension)   PAT (paroxysmal atrial tachycardia)   Hypercalcemia   COPD (chronic obstructive pulmonary disease) (HCC)   Mass of stomach   Leukocytosis   Hyponatremia   SBO -General surgery consulted -N.p.o., NG tube, IV fluid   Stomach mass -CT showing a stomach mass which is stable since imaging done in October 2021 and per radiologist may represent a GI stromal tumor, less likely lymphoma. EGD done in November 2021 showing grade B esophagitis but no stomach mass lesions seen.    Hypertension -Hold norvasc while NPO   Hyperlipidemia -Hold crestor while NPO   Paroxysmal atrial tachycardia -Hold sotalol while NPO --> IV metoprolol    History CVA -Hold aspirin while NPO  Hypokalemia -Replace    DVT prophylaxis:  SCDs Start: 08/09/22 0049  Code Status: DNR Family Communication: Daughter at bedside  Disposition Plan:  Status is: Inpatient Remains inpatient appropriate because: SBO  Consultants:  Gen surg  Procedures:  None   Antimicrobials:  Anti-infectives (From admission, onward)    None         Objective: Vitals:   08/09/22 1737 08/09/22 2150 08/10/22 0345 08/10/22 1150  BP: (!) 171/54 (!) 188/72 (!) 193/80 (!) 186/78  Pulse: 77 85 87 79  Resp: 16 18 18 18   Temp: 97.6 F (36.4 C) 98.5 F (36.9 C) 98.7 F (37.1 C) 98.7 F (37.1 C)  TempSrc:  Oral  Oral  SpO2: 95% 93% 94% 97%  Weight:      Height:        Intake/Output Summary (Last 24 hours) at 08/10/2022 1317 Last data filed at 08/10/2022 0515 Gross per 24 hour  Intake 2053.92 ml  Output 1100 ml  Net 953.92 ml   Filed Weights   08/08/22 1716  Weight: 53.1 kg    Examination:  General exam: Appears calm and comfortable  Respiratory system: Clear to auscultation. Respiratory effort normal. No respiratory distress. No conversational dyspnea.  Cardiovascular system: S1 & S2 heard, RRR. No murmurs. No pedal edema. Gastrointestinal system: Abdomen is mildly distended, soft and nontender. + bowel sounds heard. Central nervous system: Alert and oriented. No focal neurological deficits. Speech clear.  Extremities: Symmetric in appearance  Skin: No rashes, lesions or ulcers on exposed skin  Psychiatry: Judgement and insight appear normal. Mood & affect appropriate.   Data Reviewed: I have personally reviewed following labs and imaging studies  CBC: Recent Labs  Lab 08/08/22 1715 08/09/22 0619 08/10/22 0546  WBC 16.0* 10.2 9.4  NEUTROABS 14.7*  --   --   HGB 14.1 12.8 13.5  HCT 41.9 38.8 41.0  MCV 93.9 95.8 96.0  PLT 296 245 255  Basic Metabolic Panel: Recent Labs  Lab 08/08/22 1715 08/09/22 0619 08/10/22 0546  NA 133* 137 140  K 3.5 3.5 2.9*  CL 95* 104 104  CO2 27 25 27   GLUCOSE 148* 127* 108*  BUN 13 14 17   CREATININE 0.59 0.61 0.50  CALCIUM 11.1* 9.8 10.0   GFR: Estimated Creatinine Clearance: 34.5 mL/min (by C-G formula based on SCr of 0.5 mg/dL). Liver Function Tests: Recent Labs  Lab 08/08/22 1715 08/09/22 0619  AST 19 25  ALT 10 13  ALKPHOS 104 85  BILITOT 0.6 0.8  PROT 7.7  6.5  ALBUMIN 4.4 3.4*   Recent Labs  Lab 08/08/22 1715  LIPASE 33   No results for input(s): "AMMONIA" in the last 168 hours. Coagulation Profile: No results for input(s): "INR", "PROTIME" in the last 168 hours. Cardiac Enzymes: No results for input(s): "CKTOTAL", "CKMB", "CKMBINDEX", "TROPONINI" in the last 168 hours. BNP (last 3 results) No results for input(s): "PROBNP" in the last 8760 hours. HbA1C: No results for input(s): "HGBA1C" in the last 72 hours. CBG: Recent Labs  Lab 08/09/22 0612 08/09/22 1724 08/10/22 0009 08/10/22 0611 08/10/22 1149  GLUCAP 127* 130* 121* 105* 99   Lipid Profile: No results for input(s): "CHOL", "HDL", "LDLCALC", "TRIG", "CHOLHDL", "LDLDIRECT" in the last 72 hours. Thyroid Function Tests: No results for input(s): "TSH", "T4TOTAL", "FREET4", "T3FREE", "THYROIDAB" in the last 72 hours. Anemia Panel: No results for input(s): "VITAMINB12", "FOLATE", "FERRITIN", "TIBC", "IRON", "RETICCTPCT" in the last 72 hours. Sepsis Labs: No results for input(s): "PROCALCITON", "LATICACIDVEN" in the last 168 hours.  Recent Results (from the past 240 hour(s))  Resp panel by RT-PCR (RSV, Flu A&B, Covid) Anterior Nasal Swab     Status: None   Collection Time: 08/08/22  5:15 PM   Specimen: Anterior Nasal Swab  Result Value Ref Range Status   SARS Coronavirus 2 by RT PCR NEGATIVE NEGATIVE Final    Comment: (NOTE) SARS-CoV-2 target nucleic acids are NOT DETECTED.  The SARS-CoV-2 RNA is generally detectable in upper respiratory specimens during the acute phase of infection. The lowest concentration of SARS-CoV-2 viral copies this assay can detect is 138 copies/mL. A negative result does not preclude SARS-Cov-2 infection and should not be used as the sole basis for treatment or other patient management decisions. A negative result may occur with  improper specimen collection/handling, submission of specimen other than nasopharyngeal swab, presence of viral  mutation(s) within the areas targeted by this assay, and inadequate number of viral copies(<138 copies/mL). A negative result must be combined with clinical observations, patient history, and epidemiological information. The expected result is Negative.  Fact Sheet for Patients:  08/12/22  Fact Sheet for Healthcare Providers:  08/10/22  This test is no t yet approved or cleared by the [employer] FDA and  has been authorized for detection and/or diagnosis of SARS-CoV-2 by FDA under an Emergency Use Authorization (EUA). This EUA will remain  in effect (meaning this test can be used) for the duration of the COVID-19 declaration under Section 564(b)(1) of the Act, 21 U.S.C.section 360bbb-3(b)(1), unless the authorization is terminated  or revoked sooner.       Influenza A by PCR NEGATIVE NEGATIVE Final   Influenza B by PCR NEGATIVE NEGATIVE Final    Comment: (NOTE) The Xpert Xpress SARS-CoV-2/FLU/RSV plus assay is intended as an aid in the diagnosis of influenza from Nasopharyngeal swab specimens and should not be used as a sole basis for treatment. Nasal washings and aspirates are unacceptable  for Xpert Xpress SARS-CoV-2/FLU/RSV testing.  Fact Sheet for Patients: [employer]gerCourse.comhttps://www.fda.gov/media/152166/download  Fact Sheet for Healthcare Providers: SeriousBroker.ithttps://www.fda.gov/media/152162/download  This test is not yet approved or cleared by the [place] FDA and has been authorized for detection and/or diagnosis of SARS-CoV-2 by FDA under an Emergency Use Authorization (EUA). This EUA will remain in effect (meaning this test can be used) for the duration of the COVID-19 declaration under Section 564(b)(1) of the Act, 21 U.S.C. section 360bbb-3(b)(1), unless the authorization is terminated or revoked.     Resp Syncytial Virus by PCR NEGATIVE NEGATIVE Final    Comment: (NOTE) Fact Sheet for  Patients: BloggerCourse.comhttps://www.fda.gov/media/152166/download  Fact Sheet for Healthcare Providers: SeriousBroker.ithttps://www.fda.gov/media/152162/download  This test is not yet approved or cleared by the [place] FDA and has been authorized for detection and/or diagnosis of SARS-CoV-2 by FDA under an Emergency Use Authorization (EUA). This EUA will remain in effect (meaning this test can be used) for the duration of the COVID-19 declaration under Section 564(b)(1) of the Act, 21 U.S.C. section 360bbb-3(b)(1), unless the authorization is terminated or revoked.  Performed at [facility], [address], [place], [place] [address]       Radiology Studies: DG Abd Portable 1V  Result Date: 08/10/2022 CLINICAL DATA:  Small-bowel obstruction EXAM: PORTABLE ABDOMEN - 1 VIEW COMPARISON:  Radiograph 08/09/2022 FINDINGS: There are persistently dilated small bowel loops in the abdomen. Unchanged contrast within the ascending colon. Unchanged round calcification overlying the pelvis. Multilevel degenerative changes of the spine. IMPRESSION: Persistently dilated small bowel loops with contrast seen in the colon, suggestive of partial small-bowel obstruction or ileus. Electronically Signed   By: Caprice RenshawJacob  Kahn M.D.   On: 08/10/2022 08:19   DG Abd Portable 1V-Small Bowel Obstruction Protocol-initial, 8 hr delay  Result Date: 08/09/2022 CLINICAL DATA:  Small-bowel obstruction EXAM: PORTABLE ABDOMEN - 1 VIEW COMPARISON:  08/08/2022 FINDINGS: Supine frontal view of the abdomen and pelvis demonstrate enteric catheter passing below diaphragm tip and side port projecting over the gastric body. Stable dilated gas-filled loops of small bowel measuring up to 3 cm. Previously administered oral contrast is seen within the distal small bowel and proximal colon, excluding high-grade obstruction. Excreted contrast within the urinary bladder. Lung bases are clear. IMPRESSION: 1. Persistent gaseous distention of the  small bowel consistent with obstruction. However, in the interim oral contrast has progressed into the proximal colon, excluding high-grade obstruction. Continued radiographic follow-up is recommended. 2. Enteric catheter tip projecting over the gastric body. Electronically Signed   By: Sharlet SalinaMichael  Brown M.D.   On: 08/09/2022 19:27   DG Abdomen 1 View  Result Date: 08/08/2022 CLINICAL DATA:  NG placement. EXAM: ABDOMEN - 1 VIEW COMPARISON:  CT abdomen pelvis dated 08/08/2022. FINDINGS: Enteric tube with tip in the left hemiabdomen, likely in the body of the stomach. Top-normal caliber air-filled small bowel in the upper abdomen measure up to 2.6 cm. IMPRESSION: Enteric tube with tip in the body of the stomach. Electronically Signed   By: Elgie CollardArash  Radparvar M.D.   On: 08/08/2022 23:31   CT ABDOMEN PELVIS W CONTRAST  Result Date: 08/08/2022 CLINICAL DATA:  Nausea, vomiting, and diarrhea EXAM: CT ABDOMEN AND PELVIS WITH CONTRAST TECHNIQUE: Multidetector CT imaging of the abdomen and pelvis was performed using the standard protocol following bolus administration of intravenous contrast. RADIATION DOSE REDUCTION: This exam was performed according to the departmental dose-optimization program which includes automated exposure control, adjustment of the mA and/or kV according to patient size and/or use of iterative reconstruction  technique. CONTRAST:  45mL OMNIPAQUE IOHEXOL 300 MG/ML  SOLN COMPARISON:  05/26/2020 FINDINGS: Lower chest: No acute abnormality. Hepatobiliary: No focal liver abnormality is seen. No gallstones, gallbladder wall thickening, or biliary dilatation. Pancreas: Unremarkable. No pancreatic ductal dilatation or surrounding inflammatory changes. Spleen: Normal in size without focal abnormality. Adrenals/Urinary Tract: Unremarkable adrenal glands. Decreased right pelvicaliectasis compared with 05/26/2020. Right-sided cortical renal scarring. Unremarkable bladder. Stomach/Bowel: Small hiatal hernia.  Mild wall thickening of the distal esophagus. Unchanged 3.2 x 3.5 cm round heterogenous enhancing mass along the lesser curvature of the stomach (2/29). Multiple loops of dilated jejunum with abrupt transition in the posterior right hemiabdomen (series 5/image 33) in an area of small-bowel wall thickening. There is fecalization of small bowel contents immediately upstream from the area of narrowing. The downstream distal small bowel is decompressed. The colon is nondistended. The appendix is not definitively visualized. Vascular/Lymphatic: Aortic atherosclerosis. No enlarged abdominal or pelvic lymph nodes. Reproductive: Calcified fibroid in the uterus. Other: Small amount of interloop free fluid in the right hemiabdomen in the pelvis. No free intraperitoneal air. Musculoskeletal: Thoracolumbar spondylosis. Superior endplate compression deformity of L3 is new since 2021 but favored chronic. Bilateral femoral head AVN. No acute osseous abnormality. IMPRESSION: 1. Moderate to high-grade small bowel obstruction with transition point in the right lower abdomen. 2. Heterogenous mass along the lesser curvature of the stomach is unchanged from 2021 and may represent a GI stromal tumor, less likely lymphoma. 3. Additional chronic findings as described. Electronically Signed   By: Minerva Fester M.D.   On: 08/08/2022 21:37      Scheduled Meds:  lidocaine  1 patch Transdermal QHS   metoprolol tartrate  5 mg Intravenous Q6H   pantoprazole (PROTONIX) IV  40 mg Intravenous QHS   timolol  1 drop Both Eyes Daily   Continuous Infusions:  sodium chloride 100 mL/hr at 08/10/22 1125     LOS: 1 day   Time spent: 25 minutes   Noralee Stain, DO [facility] 08/10/2022, 1:17 PM   Available via Epic secure chat 7am-7pm After these hours, please refer to coverage provider listed on [email or web address]

## 2022-08-11 ENCOUNTER — Inpatient Hospital Stay (HOSPITAL_COMMUNITY): Payer: Medicare Other

## 2022-08-11 DIAGNOSIS — K56609 Unspecified intestinal obstruction, unspecified as to partial versus complete obstruction: Secondary | ICD-10-CM | POA: Diagnosis not present

## 2022-08-11 LAB — BASIC METABOLIC PANEL
Anion gap: 8 (ref 5–15)
BUN: 18 mg/dL (ref 8–23)
CO2: 23 mmol/L (ref 22–32)
Calcium: 9.6 mg/dL (ref 8.9–10.3)
Chloride: 105 mmol/L (ref 98–111)
Creatinine, Ser: 0.51 mg/dL (ref 0.44–1.00)
GFR, Estimated: >60 mL/min (ref >60)
Glucose, Bld: 75 mg/dL (ref 70–99)
Potassium: 3.3 mmol/L — ABNORMAL LOW (ref 3.5–5.1)
Sodium: 136 mmol/L (ref 135–145)

## 2022-08-11 LAB — GLUCOSE, CAPILLARY
Glucose-Capillary: 114 mg/dL — ABNORMAL HIGH (ref 70–99)
Glucose-Capillary: 70 mg/dL (ref 70–99)
Glucose-Capillary: 78 mg/dL (ref 70–99)
Glucose-Capillary: 80 mg/dL (ref 70–99)
Glucose-Capillary: 83 mg/dL (ref 70–99)

## 2022-08-11 LAB — MAGNESIUM: Magnesium: 2 mg/dL (ref 1.7–2.4)

## 2022-08-11 NOTE — Evaluation (Signed)
Physical Therapy Evaluation Patient Details Name: Linda [name] MRN: [id number] DOB: 1923-11-05 Today's Date: 08/11/2022  History of Present Illness  Patient is a 88 y.o. female presented to ED with abdominal pain and distention, nausea, and vomiting. CT showed moderate to high-grade SBO with transition point in the right lower abdomen.  There is a heterogenous mass along the lesser curvature of the stomach which is unchanged from 2021 and per radiologist may represent a GI stromal tumor, less likely lymphoma. General surgery consulted and NG tube placed. PMH significant of hypertension, hyperlipidemia, COPD, CVA, paroxysmal atrial tachycardia, appendectomy.   Clinical Impression  Linda [name] is 88 y.o. female admitted with above HPI and diagnosis. Patient is currently limited by functional impairments below (see PT problem list). Patient lives at [facility] and is independent with Rollator for mobility at facility and independent with ADL's at baseline. Currently she requires min guard for safety with transfer and gait. Patient will benefit from continued skilled PT interventions to address impairments and progress independence with mobility, recommending return to [facility] with HHPT. Acute PT will follow and progress as able.        Recommendations for follow up therapy are one component of a multi-disciplinary discharge planning process, led by the attending physician.  Recommendations may be updated based on patient status, additional functional criteria and insurance authorization.  Follow Up Recommendations Home health PT (Return to ALF with HHPT services)      Assistance Recommended at Discharge Intermittent Supervision/Assistance  Patient can return home with the following  A little help with walking and/or transfers;A little help with bathing/dressing/bathroom;Direct supervision/assist for medications management;Assist for transportation;Help with stairs or ramp for  entrance    Equipment Recommendations None recommended by PT  Recommendations for Other Services       Functional Status Assessment Patient has had a recent decline in their functional status and demonstrates the ability to make significant improvements in function in a reasonable and predictable amount of time.     Precautions / Restrictions Precautions Precautions: Fall Restrictions Weight Bearing Restrictions: No      Mobility  Bed Mobility Overal bed mobility: Needs Assistance Bed Mobility: Supine to Sit     Supine to sit: HOB elevated, Min guard     General bed mobility comments: guarding for safety, pt using bed rail to pivot and raise trunk.    Transfers Overall transfer level: Needs assistance   Transfers: Sit to/from Stand Sit to Stand: Min guard           General transfer comment: guard for safety. pt able to initiate with bil UE's to rise. able to control lowering to recliner    Ambulation/Gait Ambulation/Gait assistance: Min guard, Min assist Gait Distance (Feet): 400 Feet Assistive device: Rolling walker (2 wheels) Gait Pattern/deviations: Step-through pattern, Decreased stride length, Shuffle, Drifts right/left, Trunk flexed Gait velocity: decr     General Gait Details: pt drifting Rt with gait, likely due to walker, assist intermittently to manage walker direction. overall pt steady, posture flexed and cues needed to facilitate scanning environment.  Stairs            Wheelchair Mobility    Modified Rankin (Stroke Patients Only)       Balance Overall balance assessment: Needs assistance Sitting-balance support: Feet supported Sitting balance-Leahy Scale: Good     Standing balance support: Reliant on assistive device for balance, During functional activity, Bilateral upper extremity supported Standing balance-Leahy Scale: Poor  Pertinent Vitals/Pain Pain Assessment Pain Assessment:  Faces Faces Pain Scale: Hurts little more Pain Location: abdomen Pain Descriptors / Indicators: Discomfort Pain Intervention(s): Limited activity within patient's tolerance, Repositioned    Home Living Family/patient expects to be discharged to:: Assisted living ([facility])                   Additional Comments: level 2 assistance. they assist with meds, stand by assist for bathing.    Prior Function Prior Level of Function : Needs assist             Mobility Comments: using a tripod rollator ADLs Comments: indep for BADLs. Requires cognitive assist for medication, meals and safety. Pt's daughter reports pt is unable to use a phone (confuses it with a remote)     Hand Dominance   Dominant Hand: Right    Extremity/Trunk Assessment   Upper Extremity Assessment Upper Extremity Assessment: Generalized weakness    Lower Extremity Assessment Lower Extremity Assessment: Generalized weakness    Cervical / Trunk Assessment Cervical / Trunk Assessment: Kyphotic  Communication   Communication: HOH  Cognition Arousal/Alertness: Awake/alert Behavior During Therapy: WFL for tasks assessed/performed Overall Cognitive Status: Within Functional Limits for tasks assessed                                 General Comments: daughter present and appears to feel pt is at baseline        General Comments      Exercises General Exercises - Lower Extremity Ankle Circles/Pumps: AROM, Both, 10 reps Long Arc Quad: AROM, Both, 5 reps   Assessment/Plan    PT Assessment Patient needs continued PT services  PT Problem List Decreased strength;Decreased activity tolerance;Decreased balance;Decreased mobility;Decreased knowledge of use of DME;Decreased safety awareness;Decreased knowledge of precautions;Pain       PT Treatment Interventions DME instruction;Gait training;Stair training;Functional mobility training;Therapeutic activities;Therapeutic exercise;Balance  training;Patient/family education    PT Goals (Current goals can be found in the Care Plan section)  Acute Rehab PT Goals Patient Stated Goal: get back home and stomach to be better PT Goal Formulation: With patient/family Time For Goal Achievement: 08/25/22 Potential to Achieve Goals: Good    Frequency Min 3X/week     Co-evaluation               AM-PAC PT "6 Clicks" Mobility  Outcome Measure Help needed turning from your back to your side while in a flat bed without using bedrails?: A Little Help needed moving from lying on your back to sitting on the side of a flat bed without using bedrails?: A Little Help needed moving to and from a bed to a chair (including a wheelchair)?: A Little Help needed standing up from a chair using your arms (e.g., wheelchair or bedside chair)?: A Little Help needed to walk in hospital room?: A Little Help needed climbing 3-5 steps with a railing? : A Lot 6 Click Score: 17    End of Session Equipment Utilized During Treatment: Gait belt Activity Tolerance: Patient tolerated treatment well Patient left: in chair;with call bell/phone within reach;with family/visitor present Nurse Communication: Mobility status PT Visit Diagnosis: Unsteadiness on feet (R26.81);Muscle weakness (generalized) (M62.81);Difficulty in walking, not elsewhere classified (R26.2)    Time: [id number] PT Time Calculation (min) (ACUTE ONLY): 30 min   Charges:   PT Evaluation $PT Eval Low Complexity: 1 Low PT Treatments $Gait Training: 8-22 mins  Verner Mould, DPT Acute Rehabilitation Services Office [phone]  08/11/22 10:38 AM

## 2022-08-11 NOTE — Progress Notes (Signed)
Mobility Specialist - Progress Note   08/11/22 1615  Mobility  Activity Ambulated with assistance in hallway;Transferred to/from [facility]  Level of Assistance Standby assist, set-up cues, supervision of patient - no hands on  Assistive Device Front wheel walker  Distance Ambulated (ft) 250 ft  Activity Response Tolerated well  Mobility Referral Yes  $Mobility charge 1 Mobility   Pt received in bed and agreeable to mobility. No complaints during mobility. Pt to bed after session with all needs met.    [facility]

## 2022-08-11 NOTE — Progress Notes (Signed)
PROGRESS NOTE    Diantha Paxson  NOI:[id number] DOB: 01/21/1924 DOA: 08/08/2022 PCP: Ardith Dark, MD    Brief Narrative:  88 year old with history of hypertension, hyperlipidemia, history of appendectomy admitted with high-grade small bowel obstruction with surgical consultation.   Assessment & Plan:   Partial small bowel obstruction: Still with no bowel function. N.p.o., NG tube drainage, maintenance IV fluids, mobility.  Surgery following.  Anticipating conservative management.  Hypokalemia: Replace.  Chronic medical issues including Essential hypertension, stable.  Oral medication on hold Hyperlipidemia, stable.  Statin on hold History of stroke and paroxysmal atrial tachycardia: Stable.  Aspirin and sotalol on hold.  Changed to scheduled metoprolol.  Continue mobilizing.   DVT prophylaxis: SCDs Start: 08/09/22 0049   Code Status: DNR Family Communication: Daughter at the bedside Disposition Plan: Status is: Inpatient Remains inpatient appropriate because: Active treatment plans     Consultants:  General surgery  Procedures:  None  Antimicrobials:  None   Subjective: Patient seen in the morning rounds.  Daughter was at the bedside.  Patient tells me that she feels slightly distended.  Not passing any flatus or bowel movements.  Denies any nausea vomiting.  Objective: Vitals:   08/10/22 1150 08/10/22 2202 08/11/22 0546 08/11/22 1251  BP: (!) 186/78 (!) 181/65 (!) 175/73 (!) 171/83  Pulse: 79 80 75 75  Resp: 18 16 16    Temp: 98.7 F (37.1 C) 98.6 F (37 C) 98.6 F (37 C) 98.3 F (36.8 C)  TempSrc: Oral Oral Oral Oral  SpO2: 97% 95% 97% 97%  Weight:      Height:        Intake/Output Summary (Last 24 hours) at 08/11/2022 1421 Last data filed at 08/11/2022 1254 Gross per 24 hour  Intake 3305.13 ml  Output 360 ml  Net 2945.13 ml   Filed Weights   08/08/22 1716  Weight: 53.1 kg    Examination:  General exam: Appears calm and comfortable   She looks younger than stated age.  She is alert awake and oriented x 4. Respiratory system: Clear to auscultation. Respiratory effort normal.  No added sounds. Cardiovascular system: S1 & S2 heard, RRR. No pedal edema. Gastrointestinal system: Distended.  Nontender.  Hyperactive bowel sounds.  NG tube with minimal bilious secretion. Central nervous system: Alert and oriented. No focal neurological deficits. Extremities: Symmetric 5 x 5 power.    Data Reviewed: I have personally reviewed following labs and imaging studies  CBC: Recent Labs  Lab 08/08/22 1715 08/09/22 0619 08/10/22 0546  WBC 16.0* 10.2 9.4  NEUTROABS 14.7*  --   --   HGB 14.1 12.8 13.5  HCT 41.9 38.8 41.0  MCV 93.9 95.8 96.0  PLT 296 245 255   Basic Metabolic Panel: Recent Labs  Lab 08/08/22 1715 08/09/22 0619 08/10/22 0546 08/11/22 0542  NA 133* 137 140 136  K 3.5 3.5 2.9* 3.3*  CL 95* 104 104 105  CO2 27 25 27 23   GLUCOSE 148* 127* 108* 75  BUN 13 14 17 18   CREATININE 0.59 0.61 0.50 0.51  CALCIUM 11.1* 9.8 10.0 9.6  MG  --   --   --  2.0   GFR: Estimated Creatinine Clearance: 34.5 mL/min (by C-G formula based on SCr of 0.51 mg/dL). Liver Function Tests: Recent Labs  Lab 08/08/22 1715 08/09/22 0619  AST 19 25  ALT 10 13  ALKPHOS 104 85  BILITOT 0.6 0.8  PROT 7.7 6.5  ALBUMIN 4.4 3.4*   Recent Labs  Lab  08/08/22 1715  LIPASE 33   No results for input(s): "AMMONIA" in the last 168 hours. Coagulation Profile: No results for input(s): "INR", "PROTIME" in the last 168 hours. Cardiac Enzymes: No results for input(s): "CKTOTAL", "CKMB", "CKMBINDEX", "TROPONINI" in the last 168 hours. BNP (last 3 results) No results for input(s): "PROBNP" in the last 8760 hours. HbA1C: No results for input(s): "HGBA1C" in the last 72 hours. CBG: Recent Labs  Lab 08/10/22 1759 08/10/22 2351 08/11/22 0654 08/11/22 0724 08/11/22 1134  GLUCAP 86 74 70 114* 78   Lipid Profile: No results for  input(s): "CHOL", "HDL", "LDLCALC", "TRIG", "CHOLHDL", "LDLDIRECT" in the last 72 hours. Thyroid Function Tests: No results for input(s): "TSH", "T4TOTAL", "FREET4", "T3FREE", "THYROIDAB" in the last 72 hours. Anemia Panel: No results for input(s): "VITAMINB12", "FOLATE", "FERRITIN", "TIBC", "IRON", "RETICCTPCT" in the last 72 hours. Sepsis Labs: No results for input(s): "PROCALCITON", "LATICACIDVEN" in the last 168 hours.  Recent Results (from the past 240 hour(s))  Resp panel by RT-PCR (RSV, Flu A&B, Covid) Anterior Nasal Swab     Status: None   Collection Time: 08/08/22  5:15 PM   Specimen: Anterior Nasal Swab  Result Value Ref Range Status   SARS Coronavirus 2 by RT PCR NEGATIVE NEGATIVE Final    Comment: (NOTE) SARS-CoV-2 target nucleic acids are NOT DETECTED.  The SARS-CoV-2 RNA is generally detectable in upper respiratory specimens during the acute phase of infection. The lowest concentration of SARS-CoV-2 viral copies this assay can detect is 138 copies/mL. A negative result does not preclude SARS-Cov-2 infection and should not be used as the sole basis for treatment or other patient management decisions. A negative result may occur with  improper specimen collection/handling, submission of specimen other than nasopharyngeal swab, presence of viral mutation(s) within the areas targeted by this assay, and inadequate number of viral copies(<138 copies/mL). A negative result must be combined with clinical observations, patient history, and epidemiological information. The expected result is Negative.  Fact Sheet for Patients:  BloggerCourse.com  Fact Sheet for Healthcare Providers:  SeriousBroker.it  This test is no t yet approved or cleared by the [place] FDA and  has been authorized for detection and/or diagnosis of SARS-CoV-2 by FDA under an Emergency Use Authorization (EUA). This EUA will remain  in effect (meaning  this test can be used) for the duration of the COVID-19 declaration under Section 564(b)(1) of the Act, 21 U.S.C.section 360bbb-3(b)(1), unless the authorization is terminated  or revoked sooner.       Influenza A by PCR NEGATIVE NEGATIVE Final   Influenza B by PCR NEGATIVE NEGATIVE Final    Comment: (NOTE) The Xpert Xpress SARS-CoV-2/FLU/RSV plus assay is intended as an aid in the diagnosis of influenza from Nasopharyngeal swab specimens and should not be used as a sole basis for treatment. Nasal washings and aspirates are unacceptable for Xpert Xpress SARS-CoV-2/FLU/RSV testing.  Fact Sheet for Patients: BloggerCourse.com  Fact Sheet for Healthcare Providers: SeriousBroker.it  This test is not yet approved or cleared by the [place] FDA and has been authorized for detection and/or diagnosis of SARS-CoV-2 by FDA under an Emergency Use Authorization (EUA). This EUA will remain in effect (meaning this test can be used) for the duration of the COVID-19 declaration under Section 564(b)(1) of the Act, 21 U.S.C. section 360bbb-3(b)(1), unless the authorization is terminated or revoked.     Resp Syncytial Virus by PCR NEGATIVE NEGATIVE Final    Comment: (NOTE) Fact Sheet for Patients: BloggerCourse.com  Fact Sheet  for Healthcare Providers: SeriousBroker.it  This test is not yet approved or cleared by the [place] and has been authorized for detection and/or diagnosis of SARS-CoV-2 by FDA under an Emergency Use Authorization (EUA). This EUA will remain in effect (meaning this test can be used) for the duration of the COVID-19 declaration under Section 564(b)(1) of the Act, 21 U.S.C. section 360bbb-3(b)(1), unless the authorization is terminated or revoked.  Performed at [facility], [address] [place], [place] [address]           Radiology Studies: DG Abd Portable 1V  Result Date: 08/11/2022 CLINICAL DATA:  Persistent abdominal pain.  Small-bowel obstruction. EXAM: PORTABLE ABDOMEN - 1 VIEW COMPARISON:  Multiple recent abdominal radiographs and CT scan from 08/08/2022 FINDINGS: Persistent small bowel obstruction bowel gas pattern with mildly dilated air-filled loops of small bowel in the mid abdomen. Some contrast from the CT scan is scattered throughout colon high suggesting a partial small bowel obstruction. No free air. The NG tube is stable. Chronic bilateral hip AVN again noted. IMPRESSION: Persistent small bowel obstruction bowel gas pattern. Electronically Signed   By: Rudie Meyer M.D.   On: 08/11/2022 11:14   DG Abd Portable 1V  Result Date: 08/10/2022 CLINICAL DATA:  Small-bowel obstruction EXAM: PORTABLE ABDOMEN - 1 VIEW COMPARISON:  Radiograph 08/09/2022 FINDINGS: There are persistently dilated small bowel loops in the abdomen. Unchanged contrast within the ascending colon. Unchanged round calcification overlying the pelvis. Multilevel degenerative changes of the spine. IMPRESSION: Persistently dilated small bowel loops with contrast seen in the colon, suggestive of partial small-bowel obstruction or ileus. Electronically Signed   By: Caprice Renshaw M.D.   On: 08/10/2022 08:19   DG Abd Portable 1V-Small Bowel Obstruction Protocol-initial, 8 hr delay  Result Date: 08/09/2022 CLINICAL DATA:  Small-bowel obstruction EXAM: PORTABLE ABDOMEN - 1 VIEW COMPARISON:  08/08/2022 FINDINGS: Supine frontal view of the abdomen and pelvis demonstrate enteric catheter passing below diaphragm tip and side port projecting over the gastric body. Stable dilated gas-filled loops of small bowel measuring up to 3 cm. Previously administered oral contrast is seen within the distal small bowel and proximal colon, excluding high-grade obstruction. Excreted contrast within the urinary bladder. Lung bases are clear. IMPRESSION: 1.  Persistent gaseous distention of the small bowel consistent with obstruction. However, in the interim oral contrast has progressed into the proximal colon, excluding high-grade obstruction. Continued radiographic follow-up is recommended. 2. Enteric catheter tip projecting over the gastric body. Electronically Signed   By: Sharlet Salina M.D.   On: 08/09/2022 19:27        Scheduled Meds:  lidocaine  1 patch Transdermal QHS   metoprolol tartrate  5 mg Intravenous Q6H   pantoprazole (PROTONIX) IV  40 mg Intravenous QHS   timolol  1 drop Both Eyes Daily   Continuous Infusions:  sodium chloride 100 mL/hr at 08/11/22 1018     LOS: 2 days    Time spent: 35 minutes    Dorcas Carrow, MD [facility] Pager [phone]

## 2022-08-11 NOTE — Progress Notes (Addendum)
Progress Note     Subjective: Abdomen feels about the same - no significant pain but she is still bloated and full feeling. Burping. Ambulating. No flatus or BM  Daughter bedside  Objective: Vital signs in last 24 hours: Temp:  [98.6 F (37 C)-98.7 F (37.1 C)] 98.6 F (37 C) (12/29 0546) Pulse Rate:  [75-80] 75 (12/29 0546) Resp:  [16-18] 16 (12/29 0546) BP: (175-186)/(65-78) 175/73 (12/29 0546) SpO2:  [95 %-97 %] 97 % (12/29 0546) Last BM Date : 08/09/22  Intake/Output from previous day: 12/28 0701 - 12/29 0700 In: 2795.1 [P.O.:120; I.V.:2475.1; NG/GT:100; IV Piggyback:100] Out: 160 [Emesis/NG output:160] Intake/Output this shift: Total I/O In: 30 [P.O.:30] Out: -   PE: General: pleasant, WD, female in NAD Lungs:  Respiratory effort nonlabored Abd: soft, moderately distended. Very mild TTP diffusely. NGT with bilious output MSK: all 4 extremities are symmetrical with no cyanosis, clubbing, or edema. Skin: warm and dry Psych: A&Ox3 with an appropriate affect.    Lab Results:  Recent Labs    08/09/22 0619 08/10/22 0546  WBC 10.2 9.4  HGB 12.8 13.5  HCT 38.8 41.0  PLT 245 255   BMET Recent Labs    08/10/22 0546 08/11/22 0542  NA 140 136  K 2.9* 3.3*  CL 104 105  CO2 27 23  GLUCOSE 108* 75  BUN 17 18  CREATININE 0.50 0.51  CALCIUM 10.0 9.6   PT/INR No results for input(s): "LABPROT", "INR" in the last 72 hours. CMP     Component Value Date/Time   NA 136 08/11/2022 0542   NA 140 07/18/2017 0000   K 3.3 (L) 08/11/2022 0542   CL 105 08/11/2022 0542   CO2 23 08/11/2022 0542   GLUCOSE 75 08/11/2022 0542   BUN 18 08/11/2022 0542   BUN 8 07/18/2017 0000   CREATININE 0.51 08/11/2022 0542   CREATININE 0.62 06/23/2020 1027   CALCIUM 9.6 08/11/2022 0542   PROT 6.5 08/09/2022 0619   ALBUMIN 3.4 (L) 08/09/2022 0619   AST 25 08/09/2022 0619   ALT 13 08/09/2022 0619   ALKPHOS 85 08/09/2022 0619   BILITOT 0.8 08/09/2022 0619   GFRNONAA >60  08/11/2022 0542   GFRNONAA 77 06/23/2020 1027   GFRAA 89 06/23/2020 1027   Lipase     Component Value Date/Time   LIPASE 33 08/08/2022 1715       Studies/Results: DG Abd Portable 1V  Result Date: 08/10/2022 CLINICAL DATA:  Small-bowel obstruction EXAM: PORTABLE ABDOMEN - 1 VIEW COMPARISON:  Radiograph 08/09/2022 FINDINGS: There are persistently dilated small bowel loops in the abdomen. Unchanged contrast within the ascending colon. Unchanged round calcification overlying the pelvis. Multilevel degenerative changes of the spine. IMPRESSION: Persistently dilated small bowel loops with contrast seen in the colon, suggestive of partial small-bowel obstruction or ileus. Electronically Signed   By: Caprice Renshaw M.D.   On: 08/10/2022 08:19   DG Abd Portable 1V-Small Bowel Obstruction Protocol-initial, 8 hr delay  Result Date: 08/09/2022 CLINICAL DATA:  Small-bowel obstruction EXAM: PORTABLE ABDOMEN - 1 VIEW COMPARISON:  08/08/2022 FINDINGS: Supine frontal view of the abdomen and pelvis demonstrate enteric catheter passing below diaphragm tip and side port projecting over the gastric body. Stable dilated gas-filled loops of small bowel measuring up to 3 cm. Previously administered oral contrast is seen within the distal small bowel and proximal colon, excluding high-grade obstruction. Excreted contrast within the urinary bladder. Lung bases are clear. IMPRESSION: 1. Persistent gaseous distention of the small bowel consistent with  obstruction. However, in the interim oral contrast has progressed into the proximal colon, excluding high-grade obstruction. Continued radiographic follow-up is recommended. 2. Enteric catheter tip projecting over the gastric body. Electronically Signed   By: Sharlet Salina M.D.   On: 08/09/2022 19:27    Anti-infectives: Anti-infectives (From admission, onward)    None        Assessment/Plan SBO, likely adhesive in setting of prior open appendectomy - CT with mod  to high grade SBO with TP in right lower abdomen - SBO protocol completed with contrast in colon.  - NGT output down but no bowel activity yet and still distended. Continue LIWS today  - xray today pending - no urgent/emergent surgery indicated at this time and hopeful for improvement with conservative management  FEN: NGT LIWS, ice chips ID: none VTE: okay for chemical prophylaxis from surgical standpoint  I reviewed hospitalist notes, last 24 h vitals and pain scores, last 48 h intake and output, last 24 h labs and trends, and last 24 h imaging results.    LOS: 2 days   Eric Form, [facility] Surgery 08/11/2022, 9:05 AM Please see Amion for pager number during day hours 7:00am-4:30pm

## 2022-08-11 NOTE — Plan of Care (Signed)
  Problem: Education: Goal: Knowledge of General Education information will improve Description: Including pain rating scale, medication(s)/side effects and non-pharmacologic comfort measures Outcome: Progressing   Problem: Activity: Goal: Risk for activity intolerance will decrease Outcome: Progressing   Problem: Elimination: Goal: Will not experience complications related to bowel motility Outcome: Progressing   

## 2022-08-11 NOTE — TOC Progression Note (Signed)
Transition of Care [facility]) - Progression Note   Patient Details  Name: Linda [name] MRN: [id number] Date of Birth: 08-12-1924  Transition of [facility]) CM/SW Contact  Ewing Schlein, LCSW Phone Number: 08/11/2022, 2:00 PM  Clinical Narrative: Patient is expected to be here through the weekend. CSW updated [place] at [facility].  Expected Discharge Plan: Assisted Living Barriers to Discharge: Continued Medical Work up  Expected Discharge Plan and Services In-house Referral: Clinical Social Work Living arrangements for the past 2 months: Assisted Living Facility             DME Arranged: N/A DME Agency: NA  Social Determinants of Health (SDOH) Interventions SDOH Screenings   Food Insecurity: No Food Insecurity (08/09/2022)  Housing: Low Risk  (08/09/2022)  Transportation Needs: No Transportation Needs (08/09/2022)  Utilities: Not At Risk (08/09/2022)  Depression (PHQ2-9): Low Risk  (08/12/2021)  Financial Resource Strain: Low Risk  (08/12/2021)  Physical Activity: Inactive (08/12/2021)  Social Connections: Socially Isolated (08/12/2021)  Stress: No Stress Concern Present (08/12/2021)  Tobacco Use: Medium Risk (08/08/2022)   Readmission Risk Interventions     No data to display

## 2022-08-12 DIAGNOSIS — K56609 Unspecified intestinal obstruction, unspecified as to partial versus complete obstruction: Secondary | ICD-10-CM | POA: Diagnosis not present

## 2022-08-12 LAB — COMPREHENSIVE METABOLIC PANEL
ALT: 16 U/L (ref 0–44)
AST: 33 U/L (ref 15–41)
Albumin: 3.1 g/dL — ABNORMAL LOW (ref 3.5–5.0)
Alkaline Phosphatase: 67 U/L (ref 38–126)
Anion gap: 10 (ref 5–15)
BUN: 15 mg/dL (ref 8–23)
CO2: 21 mmol/L — ABNORMAL LOW (ref 22–32)
Calcium: 9.4 mg/dL (ref 8.9–10.3)
Chloride: 107 mmol/L (ref 98–111)
Creatinine, Ser: 0.52 mg/dL (ref 0.44–1.00)
GFR, Estimated: >60 mL/min (ref >60)
Glucose, Bld: 127 mg/dL — ABNORMAL HIGH (ref 70–99)
Potassium: 2.6 mmol/L — CL (ref 3.5–5.1)
Sodium: 138 mmol/L (ref 135–145)
Total Bilirubin: 0.9 mg/dL (ref 0.3–1.2)
Total Protein: 5.7 g/dL — ABNORMAL LOW (ref 6.5–8.1)

## 2022-08-12 LAB — GLUCOSE, CAPILLARY
Glucose-Capillary: 119 mg/dL — ABNORMAL HIGH (ref 70–99)
Glucose-Capillary: 64 mg/dL — ABNORMAL LOW (ref 70–99)
Glucose-Capillary: 120 mg/dL — ABNORMAL HIGH (ref 70–99)
Glucose-Capillary: 166 mg/dL — ABNORMAL HIGH (ref 70–99)
Glucose-Capillary: 145 mg/dL — ABNORMAL HIGH (ref 70–99)

## 2022-08-12 LAB — CBC WITH DIFFERENTIAL/PLATELET
Abs Immature Granulocytes: 0.03 10*3/uL (ref 0.00–0.07)
Basophils Absolute: 0 10*3/uL (ref 0.0–0.1)
Basophils Relative: 0 %
Eosinophils Absolute: 0.2 10*3/uL (ref 0.0–0.5)
Eosinophils Relative: 2 %
HCT: 36.1 % (ref 36.0–46.0)
Hemoglobin: 11.8 g/dL — ABNORMAL LOW (ref 12.0–15.0)
Immature Granulocytes: 0 %
Lymphocytes Relative: 14 %
Lymphs Abs: 1.2 10*3/uL (ref 0.7–4.0)
MCH: 31.6 pg (ref 26.0–34.0)
MCHC: 32.7 g/dL (ref 30.0–36.0)
MCV: 96.5 fL (ref 80.0–100.0)
Monocytes Absolute: 0.8 10*3/uL (ref 0.1–1.0)
Monocytes Relative: 8 %
Neutro Abs: 6.8 10*3/uL (ref 1.7–7.7)
Neutrophils Relative %: 76 %
Platelets: 206 10*3/uL (ref 150–400)
RBC: 3.74 MIL/uL — ABNORMAL LOW (ref 3.87–5.11)
RDW: 12.8 % (ref 11.5–15.5)
WBC: 9 10*3/uL (ref 4.0–10.5)
nRBC: 0 % (ref 0.0–0.2)

## 2022-08-12 LAB — MAGNESIUM: Magnesium: 1.8 mg/dL (ref 1.7–2.4)

## 2022-08-12 MED ORDER — SOTALOL HCL 80 MG PO TABS
80.0000 mg | ORAL_TABLET | Freq: Two times a day (BID) | ORAL | Status: DC
  Administered 2022-08-12 – 2022-08-14 (×5): 80 mg via ORAL
  Filled 2022-08-12 (×5): qty 1

## 2022-08-12 MED ORDER — ACETAMINOPHEN 325 MG PO TABS: 650.0000 mg | ORAL_TABLET | Freq: Four times a day (QID) | ORAL | Status: DC | PRN

## 2022-08-12 MED ORDER — POTASSIUM CHLORIDE 2 MEQ/ML IV SOLN
INTRAVENOUS | Status: DC
  Filled 2022-08-12 (×8): qty 1000

## 2022-08-12 MED ORDER — POTASSIUM CHLORIDE CRYS ER 20 MEQ PO TBCR
40.0000 meq | EXTENDED_RELEASE_TABLET | Freq: Two times a day (BID) | ORAL | Status: DC
  Administered 2022-08-12 – 2022-08-13 (×3): 40 meq via ORAL
  Filled 2022-08-12 (×3): qty 2

## 2022-08-12 NOTE — Progress Notes (Signed)
Patient's potassium was 2.6. Notified MD Lyndel Safe. Ordered received. Will continue monitor.

## 2022-08-12 NOTE — Progress Notes (Signed)
PROGRESS NOTE    Linda [name]  IWP:[id number] DOB: October 27, 1923 DOA: 08/08/2022 PCP: Ardith Dark, MD    Brief Narrative:  88 year old with history of hypertension, hyperlipidemia, history of appendectomy admitted with high-grade small bowel obstruction. surgical consultation.   Assessment & Plan:   Partial small bowel obstruction:  Some clinical improvement today. Started on clears.  Continue maintenance IV fluids.  Mobility. Anticipating conservative management.  Hypokalemia: Replace both IV and oral.  Recheck levels.  Magnesium is adequate.  Chronic medical issues including Essential hypertension, elevated.  Restart oral medications. Hyperlipidemia, stable.  Statin on hold.  Will restart on discharge. History of stroke and paroxysmal atrial tachycardia: Stable.  Aspirin and sotalol on hold.  Changed to scheduled metoprolol.  Continue metoprolol until stabilized.  Continue mobilizing.   DVT prophylaxis: SCDs Start: 08/09/22 0049   Code Status: DNR Family Communication: None today. Disposition Plan: Status is: Inpatient Remains inpatient appropriate because: Active treatment plans     Consultants:  General surgery  Procedures:  None  Antimicrobials:  None   Subjective: Patient seen and examined.  She had multiple loose bowel movements overnight.  Denies any nausea vomiting.  She is going slow on clears.  No other overnight events.  Objective: Vitals:   08/11/22 1251 08/11/22 1738 08/11/22 2205 08/12/22 0536  BP: (!) 171/83 (!) 185/65 (!) 177/59 (!) 165/60  Pulse: 75 71 76 71  Resp:   16 16  Temp: 98.3 F (36.8 C)  98.1 F (36.7 C) 98.5 F (36.9 C)  TempSrc: Oral  Oral Oral  SpO2: 97%  97% 96%  Weight:      Height:        Intake/Output Summary (Last 24 hours) at 08/12/2022 1209 Last data filed at 08/12/2022 1100 Gross per 24 hour  Intake 2239.87 ml  Output 300 ml  Net 1939.87 ml   Filed Weights   08/08/22 1716  Weight: 53.1 kg     Examination:  General exam: Appears calm and comfortable  She looks younger than stated age.  She is alert awake and oriented x 4. Respiratory system: Clear to auscultation. Respiratory effort normal.  No added sounds. Cardiovascular system: S1 & S2 heard, RRR. No pedal edema. Gastrointestinal system: Nontender. Normal bowel sounds.  Central nervous system: Alert and oriented. No focal neurological deficits. Extremities: Symmetric 5 x 5 power.    Data Reviewed: I have personally reviewed following labs and imaging studies  CBC: Recent Labs  Lab 08/08/22 1715 08/09/22 0619 08/10/22 0546 08/12/22 0839  WBC 16.0* 10.2 9.4 9.0  NEUTROABS 14.7*  --   --  6.8  HGB 14.1 12.8 13.5 11.8*  HCT 41.9 38.8 41.0 36.1  MCV 93.9 95.8 96.0 96.5  PLT 296 245 255 206   Basic Metabolic Panel: Recent Labs  Lab 08/08/22 1715 08/09/22 0619 08/10/22 0546 08/11/22 0542 08/12/22 0609  NA 133* 137 140 136 138  K 3.5 3.5 2.9* 3.3* 2.6*  CL 95* 104 104 105 107  CO2 27 25 27 23  21*  GLUCOSE 148* 127* 108* 75 127*  BUN 13 14 17 18 15   CREATININE 0.59 0.61 0.50 0.51 0.52  CALCIUM 11.1* 9.8 10.0 9.6 9.4  MG  --   --   --  2.0 1.8   GFR: Estimated Creatinine Clearance: 34.5 mL/min (by C-G formula based on SCr of 0.52 mg/dL). Liver Function Tests: Recent Labs  Lab 08/08/22 1715 08/09/22 0619 08/12/22 0609  AST 19 25 33  ALT 10 13 16  ALKPHOS 104 85 67  BILITOT 0.6 0.8 0.9  PROT 7.7 6.5 5.7*  ALBUMIN 4.4 3.4* 3.1*   Recent Labs  Lab 08/08/22 1715  LIPASE 33   No results for input(s): "AMMONIA" in the last 168 hours. Coagulation Profile: No results for input(s): "INR", "PROTIME" in the last 168 hours. Cardiac Enzymes: No results for input(s): "CKTOTAL", "CKMB", "CKMBINDEX", "TROPONINI" in the last 168 hours. BNP (last 3 results) No results for input(s): "PROBNP" in the last 8760 hours. HbA1C: No results for input(s): "HGBA1C" in the last 72 hours. CBG: Recent Labs  Lab  08/11/22 1754 08/11/22 2354 08/12/22 0544 08/12/22 0619 08/12/22 1148  GLUCAP 80 83 64* 119* 120*   Lipid Profile: No results for input(s): "CHOL", "HDL", "LDLCALC", "TRIG", "CHOLHDL", "LDLDIRECT" in the last 72 hours. Thyroid Function Tests: No results for input(s): "TSH", "T4TOTAL", "FREET4", "T3FREE", "THYROIDAB" in the last 72 hours. Anemia Panel: No results for input(s): "VITAMINB12", "FOLATE", "FERRITIN", "TIBC", "IRON", "RETICCTPCT" in the last 72 hours. Sepsis Labs: No results for input(s): "PROCALCITON", "LATICACIDVEN" in the last 168 hours.  Recent Results (from the past 240 hour(s))  Resp panel by RT-PCR (RSV, Flu A&B, Covid) Anterior Nasal Swab     Status: None   Collection Time: 08/08/22  5:15 PM   Specimen: Anterior Nasal Swab  Result Value Ref Range Status   SARS Coronavirus 2 by RT PCR NEGATIVE NEGATIVE Final    Comment: (NOTE) SARS-CoV-2 target nucleic acids are NOT DETECTED.  The SARS-CoV-2 RNA is generally detectable in upper respiratory specimens during the acute phase of infection. The lowest concentration of SARS-CoV-2 viral copies this assay can detect is 138 copies/mL. A negative result does not preclude SARS-Cov-2 infection and should not be used as the sole basis for treatment or other patient management decisions. A negative result may occur with  improper specimen collection/handling, submission of specimen other than nasopharyngeal swab, presence of viral mutation(s) within the areas targeted by this assay, and inadequate number of viral copies(<138 copies/mL). A negative result must be combined with clinical observations, patient history, and epidemiological information. The expected result is Negative.  Fact Sheet for Patients:  BloggerCourse.com  Fact Sheet for Healthcare Providers:  SeriousBroker.it  This test is no t yet approved or cleared by the [place] FDA and  has been authorized  for detection and/or diagnosis of SARS-CoV-2 by FDA under an Emergency Use Authorization (EUA). This EUA will remain  in effect (meaning this test can be used) for the duration of the COVID-19 declaration under Section 564(b)(1) of the Act, 21 U.S.C.section 360bbb-3(b)(1), unless the authorization is terminated  or revoked sooner.       Influenza A by PCR NEGATIVE NEGATIVE Final   Influenza B by PCR NEGATIVE NEGATIVE Final    Comment: (NOTE) The Xpert Xpress SARS-CoV-2/FLU/RSV plus assay is intended as an aid in the diagnosis of influenza from Nasopharyngeal swab specimens and should not be used as a sole basis for treatment. Nasal washings and aspirates are unacceptable for Xpert Xpress SARS-CoV-2/FLU/RSV testing.  Fact Sheet for Patients: BloggerCourse.com  Fact Sheet for Healthcare Providers: SeriousBroker.it  This test is not yet approved or cleared by the [place] FDA and has been authorized for detection and/or diagnosis of SARS-CoV-2 by FDA under an Emergency Use Authorization (EUA). This EUA will remain in effect (meaning this test can be used) for the duration of the COVID-19 declaration under Section 564(b)(1) of the Act, 21 U.S.C. section 360bbb-3(b)(1), unless the authorization is terminated or revoked.  Resp Syncytial Virus by PCR NEGATIVE NEGATIVE Final    Comment: (NOTE) Fact Sheet for Patients: BloggerCourse.com  Fact Sheet for Healthcare Providers: SeriousBroker.it  This test is not yet approved or cleared by the [place] FDA and has been authorized for detection and/or diagnosis of SARS-CoV-2 by FDA under an Emergency Use Authorization (EUA). This EUA will remain in effect (meaning this test can be used) for the duration of the COVID-19 declaration under Section 564(b)(1) of the Act, 21 U.S.C. section 360bbb-3(b)(1), unless the authorization is  terminated or revoked.  Performed at [facility], [address], [place], [place] [address]          Radiology Studies: DG Abd Portable 1V  Result Date: 08/11/2022 CLINICAL DATA:  Persistent abdominal pain.  Small-bowel obstruction. EXAM: PORTABLE ABDOMEN - 1 VIEW COMPARISON:  Multiple recent abdominal radiographs and CT scan from 08/08/2022 FINDINGS: Persistent small bowel obstruction bowel gas pattern with mildly dilated air-filled loops of small bowel in the mid abdomen. Some contrast from the CT scan is scattered throughout colon high suggesting a partial small bowel obstruction. No free air. The NG tube is stable. Chronic bilateral hip AVN again noted. IMPRESSION: Persistent small bowel obstruction bowel gas pattern. Electronically Signed   By: Rudie Meyer M.D.   On: 08/11/2022 11:14        Scheduled Meds:  lidocaine  1 patch Transdermal QHS   metoprolol tartrate  5 mg Intravenous Q6H   pantoprazole (PROTONIX) IV  40 mg Intravenous QHS   potassium chloride  40 mEq Oral BID   timolol  1 drop Both Eyes Daily   Continuous Infusions:  dextrose 5% lactated ringers 1,000 mL with potassium chloride 40 mEq infusion 125 mL/hr at 08/12/22 1024     LOS: 3 days    Time spent: 35 minutes    Dorcas Carrow, MD [facility] Pager [phone]

## 2022-08-12 NOTE — Progress Notes (Signed)
   Subjective/Chief Complaint: Had a lot of flatus and loose bms, feels much better   Objective: Vital signs in last 24 hours: Temp:  [98.1 F (36.7 C)-98.5 F (36.9 C)] 98.5 F (36.9 C) (12/30 0536) Pulse Rate:  [71-76] 71 (12/30 0536) Resp:  [16] 16 (12/30 0536) BP: (165-185)/(59-83) 165/60 (12/30 0536) SpO2:  [96 %-97 %] 96 % (12/30 0536) Last BM Date : 08/09/22  Intake/Output from previous day: 12/29 0701 - 12/30 0700 In: 2479.9 [P.O.:210; I.V.:2269.9] Out: 400 [Emesis/NG output:400] Intake/Output this shift: No intake/output data recorded.  PE: General: pleasant, WD, female in NAD Abd: soft, nondistended.   Lab Results:  Recent Labs    08/10/22 0546  WBC 9.4  HGB 13.5  HCT 41.0  PLT 255   BMET Recent Labs    08/11/22 0542 08/12/22 0609  NA 136 138  K 3.3* 2.6*  CL 105 107  CO2 23 21*  GLUCOSE 75 127*  BUN 18 15  CREATININE 0.51 0.52  CALCIUM 9.6 9.4   PT/INR No results for input(s): "LABPROT", "INR" in the last 72 hours. ABG No results for input(s): "PHART", "HCO3" in the last 72 hours.  Invalid input(s): "PCO2", "PO2"  Studies/Results: DG Abd Portable 1V  Result Date: 08/11/2022 CLINICAL DATA:  Persistent abdominal pain.  Small-bowel obstruction. EXAM: PORTABLE ABDOMEN - 1 VIEW COMPARISON:  Multiple recent abdominal radiographs and CT scan from 08/08/2022 FINDINGS: Persistent small bowel obstruction bowel gas pattern with mildly dilated air-filled loops of small bowel in the mid abdomen. Some contrast from the CT scan is scattered throughout colon high suggesting a partial small bowel obstruction. No free air. The NG tube is stable. Chronic bilateral hip AVN again noted. IMPRESSION: Persistent small bowel obstruction bowel gas pattern. Electronically Signed   By: Rudie Meyer M.D.   On: 08/11/2022 11:14    Anti-infectives: Anti-infectives (From admission, onward)    None       Assessment/Plan: SBO, likely adhesive in setting of prior  open appendectomy - CT with mod to high grade SBO with TP in right lower abdomen - SBO protocol completed with contrast in colon.  -clinically much better today and has resolved -dc ng start clears, will go slow   FEN: clears ID: none VTE: okay for chemical prophylaxis from surgical standpoint    I reviewed hospitalist notes, last 24 h vitals and pain scores, last 48 h intake and output, last 24 h labs and trends, and last 24 h imaging results.  This care required moderate level of medical decision making.   Emelia Loron 08/12/2022

## 2022-08-13 DIAGNOSIS — K56609 Unspecified intestinal obstruction, unspecified as to partial versus complete obstruction: Secondary | ICD-10-CM | POA: Diagnosis not present

## 2022-08-13 LAB — CBC WITH DIFFERENTIAL/PLATELET
Abs Immature Granulocytes: 0.03 10*3/uL (ref 0.00–0.07)
Basophils Absolute: 0 10*3/uL (ref 0.0–0.1)
Basophils Relative: 0 %
Eosinophils Absolute: 0.5 10*3/uL (ref 0.0–0.5)
Eosinophils Relative: 5 %
HCT: 39.4 % (ref 36.0–46.0)
Hemoglobin: 13.2 g/dL (ref 12.0–15.0)
Immature Granulocytes: 0 %
Lymphocytes Relative: 16 %
Lymphs Abs: 1.5 10*3/uL (ref 0.7–4.0)
MCH: 31.8 pg (ref 26.0–34.0)
MCHC: 33.5 g/dL (ref 30.0–36.0)
MCV: 94.9 fL (ref 80.0–100.0)
Monocytes Absolute: 0.8 10*3/uL (ref 0.1–1.0)
Monocytes Relative: 8 %
Neutro Abs: 6.7 10*3/uL (ref 1.7–7.7)
Neutrophils Relative %: 71 %
Platelets: 280 10*3/uL (ref 150–400)
RBC: 4.15 MIL/uL (ref 3.87–5.11)
RDW: 12.9 % (ref 11.5–15.5)
WBC: 9.6 10*3/uL (ref 4.0–10.5)
nRBC: 0 % (ref 0.0–0.2)

## 2022-08-13 LAB — BASIC METABOLIC PANEL
Anion gap: 5 (ref 5–15)
BUN: <5 mg/dL — ABNORMAL LOW (ref 8–23)
CO2: 27 mmol/L (ref 22–32)
Calcium: 9.9 mg/dL (ref 8.9–10.3)
Chloride: 101 mmol/L (ref 98–111)
Creatinine, Ser: 0.46 mg/dL (ref 0.44–1.00)
GFR, Estimated: >60 mL/min (ref >60)
Glucose, Bld: 122 mg/dL — ABNORMAL HIGH (ref 70–99)
Potassium: 5.3 mmol/L — ABNORMAL HIGH (ref 3.5–5.1)
Sodium: 133 mmol/L — ABNORMAL LOW (ref 135–145)

## 2022-08-13 LAB — GLUCOSE, CAPILLARY
Glucose-Capillary: 121 mg/dL — ABNORMAL HIGH (ref 70–99)
Glucose-Capillary: 134 mg/dL — ABNORMAL HIGH (ref 70–99)
Glucose-Capillary: 147 mg/dL — ABNORMAL HIGH (ref 70–99)
Glucose-Capillary: 151 mg/dL — ABNORMAL HIGH (ref 70–99)

## 2022-08-13 MED ORDER — VITAMIN D3 25 MCG (1000 UNIT) PO TABS
1000.0000 [IU] | ORAL_TABLET | Freq: Two times a day (BID) | ORAL | Status: DC
  Administered 2022-08-13 – 2022-08-14 (×2): 1000 [IU] via ORAL
  Filled 2022-08-13 (×2): qty 1

## 2022-08-13 MED ORDER — CALCIUM CARBONATE ANTACID 500 MG PO CHEW: 750.0000 mg | CHEWABLE_TABLET | Freq: Every day | ORAL | Status: DC | PRN

## 2022-08-13 MED ORDER — FAMOTIDINE 20 MG PO TABS
20.0000 mg | ORAL_TABLET | Freq: Two times a day (BID) | ORAL | Status: DC
  Administered 2022-08-13 – 2022-08-14 (×2): 20 mg via ORAL
  Filled 2022-08-13 (×2): qty 1

## 2022-08-13 MED ORDER — MIRTAZAPINE 15 MG PO TABS
30.0000 mg | ORAL_TABLET | Freq: Every day | ORAL | Status: DC
  Administered 2022-08-13: 30 mg via ORAL
  Filled 2022-08-13: qty 2

## 2022-08-13 MED ORDER — ASPIRIN 81 MG PO TBEC
81.0000 mg | DELAYED_RELEASE_TABLET | Freq: Every day | ORAL | Status: DC
  Administered 2022-08-13 – 2022-08-14 (×2): 81 mg via ORAL
  Filled 2022-08-13 (×2): qty 1

## 2022-08-13 MED ORDER — DEXTROSE IN LACTATED RINGERS 5 % IV SOLN: INTRAVENOUS | Status: DC

## 2022-08-13 MED ORDER — AMLODIPINE BESYLATE 5 MG PO TABS
2.5000 mg | ORAL_TABLET | Freq: Every day | ORAL | Status: DC
  Administered 2022-08-13 – 2022-08-14 (×2): 2.5 mg via ORAL
  Filled 2022-08-13 (×2): qty 1

## 2022-08-13 NOTE — Progress Notes (Signed)
PROGRESS NOTE    Linda [name]  YIR:[id number] DOB: [birth year] DOA: 08/08/2022 PCP: Ardith Dark, MD    Brief Narrative:  88 year old with history of hypertension, hyperlipidemia, history of appendectomy admitted with high-grade small bowel obstruction. surgical consultation.   Assessment & Plan:   Partial small bowel obstruction:  Improved and with diarrhea now.  Tolerating clears.  Started on soft diet.  Surgery following.  Continue maintenance IV fluids. Anticipating conservative management.  Hypokalemia: Replaced and adequate.  Discontinue further replacement.  Chronic medical issues including Essential hypertension, elevated.  Home meds restarted. Hyperlipidemia, stable.  Statin on hold.  Will restart on discharge. History of stroke and paroxysmal atrial tachycardia: Stable.  Sotalol resumed. Continue mobilizing.   DVT prophylaxis: SCDs Start: 08/09/22 0049   Code Status: DNR Family Communication: Daughter at the bedside. Disposition Plan: Status is: Inpatient Remains inpatient appropriate because: Active treatment plans     Consultants:  General surgery  Procedures:  None  Antimicrobials:  None   Subjective: Seen and examined.  Denies any complaints but she is tired of having diarrhea.  Loose watery stool overnight multiple times.  Tolerated clears.  Objective: Vitals:   08/12/22 1410 08/12/22 2135 08/13/22 0510 08/13/22 1344  BP: (!) 166/66 (!) 173/57 (!) 159/86 (!) 199/83  Pulse: 91 81 75 80  Resp: 14 16 18 16   Temp:  97.8 F (36.6 C) 97.6 F (36.4 C) 98.8 F (37.1 C)  TempSrc:  Oral Oral   SpO2: 96% 98% 96% 98%  Weight:      Height:        Intake/Output Summary (Last 24 hours) at 08/13/2022 1351 Last data filed at 08/13/2022 1300 Gross per 24 hour  Intake 3164.16 ml  Output 201 ml  Net 2963.16 ml    Filed Weights   08/08/22 1716  Weight: 53.1 kg    Examination:  General exam: Appears calm and comfortable.  Not in distress.   Sitting in chair. Respiratory system: Clear to auscultation. Respiratory effort normal.  No added sounds. Cardiovascular system: S1 & S2 heard, RRR. No pedal edema. Gastrointestinal system: Nontender. Normal bowel sounds.  Central nervous system: Alert and oriented. No focal neurological deficits. Extremities: Symmetric 5 x 5 power.    Data Reviewed: I have personally reviewed following labs and imaging studies  CBC: Recent Labs  Lab 08/08/22 1715 08/09/22 0619 08/10/22 0546 08/12/22 0839 08/13/22 1035  WBC 16.0* 10.2 9.4 9.0 9.6  NEUTROABS 14.7*  --   --  6.8 6.7  HGB 14.1 12.8 13.5 11.8* 13.2  HCT 41.9 38.8 41.0 36.1 39.4  MCV 93.9 95.8 96.0 96.5 94.9  PLT 296 245 255 206 280    Basic Metabolic Panel: Recent Labs  Lab 08/09/22 0619 08/10/22 0546 08/11/22 0542 08/12/22 0609 08/13/22 1035  NA 137 140 136 138 133*  K 3.5 2.9* 3.3* 2.6* 5.3*  CL 104 104 105 107 101  CO2 25 27 23  21* 27  GLUCOSE 127* 108* 75 127* 122*  BUN 14 17 18 15  <5*  CREATININE 0.61 0.50 0.51 0.52 0.46  CALCIUM 9.8 10.0 9.6 9.4 9.9  MG  --   --  2.0 1.8  --     GFR: Estimated Creatinine Clearance: 34.5 mL/min (by C-G formula based on SCr of 0.46 mg/dL). Liver Function Tests: Recent Labs  Lab 08/08/22 1715 08/09/22 0619 08/12/22 0609  AST 19 25 33  ALT 10 13 16   ALKPHOS 104 85 67  BILITOT 0.6 0.8 0.9  PROT 7.7  6.5 5.7*  ALBUMIN 4.4 3.4* 3.1*    Recent Labs  Lab 08/08/22 1715  LIPASE 33    No results for input(s): "AMMONIA" in the last 168 hours. Coagulation Profile: No results for input(s): "INR", "PROTIME" in the last 168 hours. Cardiac Enzymes: No results for input(s): "CKTOTAL", "CKMB", "CKMBINDEX", "TROPONINI" in the last 168 hours. BNP (last 3 results) No results for input(s): "PROBNP" in the last 8760 hours. HbA1C: No results for input(s): "HGBA1C" in the last 72 hours. CBG: Recent Labs  Lab 08/12/22 1148 08/12/22 1730 08/12/22 2311 08/13/22 0505  08/13/22 1209  GLUCAP 120* 166* 145* 147* 134*    Lipid Profile: No results for input(s): "CHOL", "HDL", "LDLCALC", "TRIG", "CHOLHDL", "LDLDIRECT" in the last 72 hours. Thyroid Function Tests: No results for input(s): "TSH", "T4TOTAL", "FREET4", "T3FREE", "THYROIDAB" in the last 72 hours. Anemia Panel: No results for input(s): "VITAMINB12", "FOLATE", "FERRITIN", "TIBC", "IRON", "RETICCTPCT" in the last 72 hours. Sepsis Labs: No results for input(s): "PROCALCITON", "LATICACIDVEN" in the last 168 hours.  Recent Results (from the past 240 hour(s))  Resp panel by RT-PCR (RSV, Flu A&B, Covid) Anterior Nasal Swab     Status: None   Collection Time: 08/08/22  5:15 PM   Specimen: Anterior Nasal Swab  Result Value Ref Range Status   SARS Coronavirus 2 by RT PCR NEGATIVE NEGATIVE Final    Comment: (NOTE) SARS-CoV-2 target nucleic acids are NOT DETECTED.  The SARS-CoV-2 RNA is generally detectable in upper respiratory specimens during the acute phase of infection. The lowest concentration of SARS-CoV-2 viral copies this assay can detect is 138 copies/mL. A negative result does not preclude SARS-Cov-2 infection and should not be used as the sole basis for treatment or other patient management decisions. A negative result may occur with  improper specimen collection/handling, submission of specimen other than nasopharyngeal swab, presence of viral mutation(s) within the areas targeted by this assay, and inadequate number of viral copies(<138 copies/mL). A negative result must be combined with clinical observations, patient history, and epidemiological information. The expected result is Negative.  Fact Sheet for Patients:  BloggerCourse.com  Fact Sheet for Healthcare Providers:  SeriousBroker.it  This test is no t yet approved or cleared by the [place] FDA and  has been authorized for detection and/or diagnosis of SARS-CoV-2 by FDA  under an Emergency Use Authorization (EUA). This EUA will remain  in effect (meaning this test can be used) for the duration of the COVID-19 declaration under Section 564(b)(1) of the Act, 21 U.S.C.section 360bbb-3(b)(1), unless the authorization is terminated  or revoked sooner.       Influenza A by PCR NEGATIVE NEGATIVE Final   Influenza B by PCR NEGATIVE NEGATIVE Final    Comment: (NOTE) The Xpert Xpress SARS-CoV-2/FLU/RSV plus assay is intended as an aid in the diagnosis of influenza from Nasopharyngeal swab specimens and should not be used as a sole basis for treatment. Nasal washings and aspirates are unacceptable for Xpert Xpress SARS-CoV-2/FLU/RSV testing.  Fact Sheet for Patients: BloggerCourse.com  Fact Sheet for Healthcare Providers: SeriousBroker.it  This test is not yet approved or cleared by the [place] FDA and has been authorized for detection and/or diagnosis of SARS-CoV-2 by FDA under an Emergency Use Authorization (EUA). This EUA will remain in effect (meaning this test can be used) for the duration of the COVID-19 declaration under Section 564(b)(1) of the Act, 21 U.S.C. section 360bbb-3(b)(1), unless the authorization is terminated or revoked.     Resp Syncytial Virus by PCR  NEGATIVE NEGATIVE Final    Comment: (NOTE) Fact Sheet for Patients: BloggerCourse.com  Fact Sheet for Healthcare Providers: SeriousBroker.it  This test is not yet approved or cleared by the [place] FDA and has been authorized for detection and/or diagnosis of SARS-CoV-2 by FDA under an Emergency Use Authorization (EUA). This EUA will remain in effect (meaning this test can be used) for the duration of the COVID-19 declaration under Section 564(b)(1) of the Act, 21 U.S.C. section 360bbb-3(b)(1), unless the authorization is terminated or revoked.  Performed at [facility], [address], [place], [place] [address]          Radiology Studies: No results found.      Scheduled Meds:  lidocaine  1 patch Transdermal QHS   pantoprazole (PROTONIX) IV  40 mg Intravenous QHS   sotalol  80 mg Oral Q12H   timolol  1 drop Both Eyes Daily   Continuous Infusions:  dextrose 5% lactated ringers       LOS: 4 days    Time spent: 35 minutes    Dorcas Carrow, MD [facility] Pager [phone]

## 2022-08-13 NOTE — Progress Notes (Signed)
   Subjective/Chief Complaint: A lot of loose stool, tolerating clears fine   Objective: Vital signs in last 24 hours: Temp:  [97.6 F (36.4 C)-97.8 F (36.6 C)] 97.6 F (36.4 C) (12/31 0510) Pulse Rate:  [75-91] 75 (12/31 0510) Resp:  [14-18] 18 (12/31 0510) BP: (159-173)/(57-86) 159/86 (12/31 0510) SpO2:  [96 %-98 %] 96 % (12/31 0510) Last BM Date : 08/13/22  Intake/Output from previous day: 12/30 0701 - 12/31 0700 In: 3044.2 [P.O.:660; I.V.:2384.2] Out: 200 [Urine:200] Intake/Output this shift: No intake/output data recorded.  Ab soft nontender  Lab Results:  Recent Labs    08/12/22 0839  WBC 9.0  HGB 11.8*  HCT 36.1  PLT 206   BMET Recent Labs    08/11/22 0542 08/12/22 0609  NA 136 138  K 3.3* 2.6*  CL 105 107  CO2 23 21*  GLUCOSE 75 127*  BUN 18 15  CREATININE 0.51 0.52  CALCIUM 9.6 9.4   PT/INR No results for input(s): "LABPROT", "INR" in the last 72 hours. ABG No results for input(s): "PHART", "HCO3" in the last 72 hours.  Invalid input(s): "PCO2", "PO2"  Studies/Results: DG Abd Portable 1V  Result Date: 08/11/2022 CLINICAL DATA:  Persistent abdominal pain.  Small-bowel obstruction. EXAM: PORTABLE ABDOMEN - 1 VIEW COMPARISON:  Multiple recent abdominal radiographs and CT scan from 08/08/2022 FINDINGS: Persistent small bowel obstruction bowel gas pattern with mildly dilated air-filled loops of small bowel in the mid abdomen. Some contrast from the CT scan is scattered throughout colon high suggesting a partial small bowel obstruction. No free air. The NG tube is stable. Chronic bilateral hip AVN again noted. IMPRESSION: Persistent small bowel obstruction bowel gas pattern. Electronically Signed   By: Rudie Meyer M.D.   On: 08/11/2022 11:14    Anti-infectives: Anti-infectives (From admission, onward)    None       Assessment/Plan: SBO, likely adhesive in setting of prior open appendectomy - CT with mod to high grade SBO with TP in right  lower abdomen - SBO protocol completed with contrast in colon.  -will do soft diet today   FEN: soft diet ID: none VTE: okay for chemical prophylaxis from surgical standpoint Dispo: they would like to see PT (written for), possible dc home tomorrow from surgical standpoint     I reviewed hospitalist notes, last 24 h vitals and pain scores, last 48 h intake and output, last 24 h labs and trends, and last 24 h imaging results.   This care required moderate level of medical decision making.     Emelia Loron 08/13/2022

## 2022-08-13 NOTE — Progress Notes (Signed)
Mobility Specialist - Progress Note   08/13/22 1033  Mobility  Activity Ambulated with assistance in hallway  Level of Assistance Standby assist, set-up cues, supervision of patient - no hands on  Assistive Device Front wheel walker  Distance Ambulated (ft) 315 ft  Activity Response Tolerated well  Mobility Referral Yes  $Mobility charge 1 Mobility   Pt received on BSC and agreeable to mobility. C/o feet feeling numb after returning to room. No other complaints during mobility. Pt to bed after session with all needs met.    [facility]

## 2022-08-13 NOTE — Progress Notes (Signed)
Mobility Specialist - Progress Note   08/13/22 1555  Mobility  Activity Ambulated with assistance in hallway;Transferred to/from [facility]  Level of Assistance Standby assist, set-up cues, supervision of patient - no hands on  Assistive Device Front wheel walker  Distance Ambulated (ft) 450 ft  Activity Response Tolerated well  Mobility Referral Yes  $Mobility charge 1 Mobility   Pt received in bed and agreeable to mobility. Pt requested assistance to [facility]. No complaints during mobility session. Pt to bed after session with all needs met.    [facility]

## 2022-08-14 DIAGNOSIS — K56609 Unspecified intestinal obstruction, unspecified as to partial versus complete obstruction: Secondary | ICD-10-CM | POA: Diagnosis not present

## 2022-08-14 LAB — GLUCOSE, CAPILLARY
Glucose-Capillary: 137 mg/dL — ABNORMAL HIGH (ref 70–99)
Glucose-Capillary: 113 mg/dL — ABNORMAL HIGH (ref 70–99)

## 2022-08-14 MED ORDER — CALCIUM CARBONATE ANTACID 750 MG PO CHEW: 750.0000 mg | CHEWABLE_TABLET | Freq: Every day | ORAL | 0 refills | Status: AC | PRN

## 2022-08-14 NOTE — Progress Notes (Signed)
   Subjective/Chief Complaint: Having bowel function, tol diet   Objective: Vital signs in last 24 hours: Temp:  [97.6 F (36.4 C)-98.8 F (37.1 C)] 98.5 F (36.9 C) (01/01 0456) Pulse Rate:  [74-80] 74 (01/01 0456) Resp:  [16-18] 16 (01/01 0456) BP: (151-199)/(55-83) 163/63 (01/01 0456) SpO2:  [97 %-98 %] 97 % (01/01 0456) Last BM Date : 08/13/22  Intake/Output from previous day: 12/31 0701 - 01/01 0700 In: 2618.3 [P.O.:960; I.V.:1658.3] Out: 1 [Urine:1] Intake/Output this shift: No intake/output data recorded.  Ab soft nontender  Lab Results:  Recent Labs    08/12/22 0839 08/13/22 1035  WBC 9.0 9.6  HGB 11.8* 13.2  HCT 36.1 39.4  PLT 206 280   BMET Recent Labs    08/12/22 0609 08/13/22 1035  NA 138 133*  K 2.6* 5.3*  CL 107 101  CO2 21* 27  GLUCOSE 127* 122*  BUN 15 <5*  CREATININE 0.52 0.46  CALCIUM 9.4 9.9   PT/INR No results for input(s): "LABPROT", "INR" in the last 72 hours. ABG No results for input(s): "PHART", "HCO3" in the last 72 hours.  Invalid input(s): "PCO2", "PO2"  Studies/Results: No results found.  Anti-infectives: Anti-infectives (From admission, onward)    None       Assessment/Plan: SBO, likely adhesive in setting of prior open appendectomy - CT with mod to high grade SBO with TP in right lower abdomen - SBO protocol completed with contrast in colon.  -can dc home from my standpoint -discussed with she and her daughter may be few weeks before strength and bowel function back to baseline   FEN: soft diet ID: none VTE: okay for chemical prophylaxis from surgical standpoint   I reviewed hospitalist notes, last 24 h vitals and pain scores, last 48 h intake and output, last 24 h labs and trends, and last 24 h imaging results.  This care required straight-forward level of medical decision making.    Rolm Bookbinder 08/14/2022

## 2022-08-14 NOTE — Progress Notes (Signed)
Mobility Specialist - Progress Note   08/14/22 1052  Mobility  Activity Ambulated with assistance in hallway  Level of Assistance Standby assist, set-up cues, supervision of patient - no hands on  Assistive Device Front wheel walker  Distance Ambulated (ft) 260 ft  Activity Response Tolerated well  Mobility Referral Yes  $Mobility charge 1 Mobility   Pt received in recliner and agreeable to mobility. No complaints during mobility. Pt to bed after session with all needs met.    Maya Johnson Mobility Specialist  

## 2022-08-14 NOTE — Discharge Summary (Addendum)
Physician Discharge Summary  Linda [name] SEG:[id number] DOB: 09/21/1923 DOA: 08/08/2022  PCP: Vivi Barrack, MD  Admit date: 08/08/2022 Discharge date: 08/14/2022  Admitted From: Assisted living facility Disposition: Assisted living facility  Recommendations for Outpatient Follow-up:  Follow up with PCP in 1-2 weeks Please obtain BMP/CBC/magnesium/phosphorus in 1-2 weeks.   Home Health: N/A Equipment/Devices: N/A  Discharge Condition: Stable CODE STATUS: DNR Diet recommendation: Soft diet for 3 days and then go on regular diet  Discharge summary: 89 year old with history of hypertension, hyperlipidemia, history of appendectomy admitted with high-grade small bowel obstruction as she presented with abdominal pain nausea and constipation.  She was admitted to the hospital treated conservatively and followed by surgery.  Ultimately improving.  # Partial small bowel obstruction:  Improved with conservative management.  Tolerating regular diet.  Plan is to stay on soft diet for next 3 days then gradually advance to regular and as tolerated diet.    Hypokalemia: Replaced and adequate.     Chronic medical issues including Essential hypertension,  Home meds restarted. Hyperlipidemia, stable.  Statin to resume on discharge. History of stroke and paroxysmal atrial tachycardia: Stable.  Sotalol resumed. Continue mobilizing.  Medically stable to transfer back to assisted living facility today.  Discharge Diagnoses:  Principal Problem:   SBO (small bowel obstruction) (HCC) Active Problems:   Hyperlipidemia   HTN (hypertension)   PAT (paroxysmal atrial tachycardia)   Hypercalcemia   COPD (chronic obstructive pulmonary disease) (HCC)   Mass of stomach   Leukocytosis   Hyponatremia    Discharge Instructions  Discharge Instructions     Call MD for:  severe uncontrolled pain   Complete by: As directed    Diet general   Complete by: As directed    Soft diet for 3 days then  go back on regular diet   Increase activity slowly   Complete by: As directed       Allergies as of 08/14/2022   No Known Allergies      Medication List     STOP taking these medications    oxycodone-acetaminophen 2.5-300 MG per tablet Commonly known as: LYNOX       TAKE these medications    acetaminophen 500 MG tablet Commonly known as: TYLENOL Take 1,000 mg by mouth every 6 (six) hours as needed for mild pain.   albuterol 108 (90 Base) MCG/ACT inhaler Commonly known as: VENTOLIN HFA Inhale 1-2 puffs into the lungs every 6 (six) hours as needed for wheezing or shortness of breath.   amLODipine 2.5 MG tablet Commonly known as: NORVASC Take 1 tablet (2.5 mg total) by mouth daily.   aspirin EC 81 MG tablet Take 1 tablet (81 mg total) by mouth daily. Swallow whole.   calcium carbonate 750 MG chewable tablet Commonly known as: TUMS EX Chew 1 tablet (750 mg total) by mouth daily as needed for heartburn.   famotidine 20 MG tablet Commonly known as: PEPCID Take 1 tablet (20 mg total) by mouth 2 (two) times daily.   Infants Simethicone 40 MG/0.6ML drops Generic drug: simethicone Take 40 mg by mouth 2 (two) times daily.   lidocaine 5 % Commonly known as: LIDODERM Place 1 patch onto the skin daily.   mirtazapine 30 MG tablet Commonly known as: REMERON Take 30 mg by mouth at bedtime.   rosuvastatin 20 MG tablet Commonly known as: CRESTOR TAKE 1 TABLET DAILY   sotalol 80 MG tablet Commonly known as: BETAPACE TAKE 1 TABLET EVERY 12 HOURS What changed:  when to take this   timolol 0.25 % ophthalmic solution Commonly known as: TIMOPTIC Place 1 drop into both eyes daily.   traMADol 50 MG tablet Commonly known as: ULTRAM Take 0.5-1 tablets (25-50 mg total) by mouth every 6 (six) hours as needed. What changed: reasons to take this   Vitamin D3 25 MCG (1000 UT) Caps Take 1,000 Units by mouth 2 (two) times daily.        No Known  Allergies  Consultations: General surgery   Procedures/Studies: DG Abd Portable 1V  Result Date: 08/11/2022 CLINICAL DATA:  Persistent abdominal pain.  Small-bowel obstruction. EXAM: PORTABLE ABDOMEN - 1 VIEW COMPARISON:  Multiple recent abdominal radiographs and CT scan from 08/08/2022 FINDINGS: Persistent small bowel obstruction bowel gas pattern with mildly dilated air-filled loops of small bowel in the mid abdomen. Some contrast from the CT scan is scattered throughout colon high suggesting a partial small bowel obstruction. No free air. The NG tube is stable. Chronic bilateral hip AVN again noted. IMPRESSION: Persistent small bowel obstruction bowel gas pattern. Electronically Signed   By: Marijo Sanes M.D.   On: 08/11/2022 11:14   DG Abd Portable 1V  Result Date: 08/10/2022 CLINICAL DATA:  Small-bowel obstruction EXAM: PORTABLE ABDOMEN - 1 VIEW COMPARISON:  Radiograph 08/09/2022 FINDINGS: There are persistently dilated small bowel loops in the abdomen. Unchanged contrast within the ascending colon. Unchanged round calcification overlying the pelvis. Multilevel degenerative changes of the spine. IMPRESSION: Persistently dilated small bowel loops with contrast seen in the colon, suggestive of partial small-bowel obstruction or ileus. Electronically Signed   By: Maurine Simmering M.D.   On: 08/10/2022 08:19   DG Abd Portable 1V-Small Bowel Obstruction Protocol-initial, 8 hr delay  Result Date: 08/09/2022 CLINICAL DATA:  Small-bowel obstruction EXAM: PORTABLE ABDOMEN - 1 VIEW COMPARISON:  08/08/2022 FINDINGS: Supine frontal view of the abdomen and pelvis demonstrate enteric catheter passing below diaphragm tip and side port projecting over the gastric body. Stable dilated gas-filled loops of small bowel measuring up to 3 cm. Previously administered oral contrast is seen within the distal small bowel and proximal colon, excluding high-grade obstruction. Excreted contrast within the urinary bladder.  Lung bases are clear. IMPRESSION: 1. Persistent gaseous distention of the small bowel consistent with obstruction. However, in the interim oral contrast has progressed into the proximal colon, excluding high-grade obstruction. Continued radiographic follow-up is recommended. 2. Enteric catheter tip projecting over the gastric body. Electronically Signed   By: Randa Ngo M.D.   On: 08/09/2022 19:27   DG Abdomen 1 View  Result Date: 08/08/2022 CLINICAL DATA:  NG placement. EXAM: ABDOMEN - 1 VIEW COMPARISON:  CT abdomen pelvis dated 08/08/2022. FINDINGS: Enteric tube with tip in the left hemiabdomen, likely in the body of the stomach. Top-normal caliber air-filled small bowel in the upper abdomen measure up to 2.6 cm. IMPRESSION: Enteric tube with tip in the body of the stomach. Electronically Signed   By: Anner Crete M.D.   On: 08/08/2022 23:31   CT ABDOMEN PELVIS W CONTRAST  Result Date: 08/08/2022 CLINICAL DATA:  Nausea, vomiting, and diarrhea EXAM: CT ABDOMEN AND PELVIS WITH CONTRAST TECHNIQUE: Multidetector CT imaging of the abdomen and pelvis was performed using the standard protocol following bolus administration of intravenous contrast. RADIATION DOSE REDUCTION: This exam was performed according to the departmental dose-optimization program which includes automated exposure control, adjustment of the mA and/or kV according to patient size and/or use of iterative reconstruction technique. CONTRAST:  62mL OMNIPAQUE IOHEXOL 300 MG/ML  SOLN COMPARISON:  05/26/2020 FINDINGS: Lower chest: No acute abnormality. Hepatobiliary: No focal liver abnormality is seen. No gallstones, gallbladder wall thickening, or biliary dilatation. Pancreas: Unremarkable. No pancreatic ductal dilatation or surrounding inflammatory changes. Spleen: Normal in size without focal abnormality. Adrenals/Urinary Tract: Unremarkable adrenal glands. Decreased right pelvicaliectasis compared with 05/26/2020. Right-sided cortical  renal scarring. Unremarkable bladder. Stomach/Bowel: Small hiatal hernia. Mild wall thickening of the distal esophagus. Unchanged 3.2 x 3.5 cm round heterogenous enhancing mass along the lesser curvature of the stomach (2/29). Multiple loops of dilated jejunum with abrupt transition in the posterior right hemiabdomen (series 5/image 33) in an area of small-bowel wall thickening. There is fecalization of small bowel contents immediately upstream from the area of narrowing. The downstream distal small bowel is decompressed. The colon is nondistended. The appendix is not definitively visualized. Vascular/Lymphatic: Aortic atherosclerosis. No enlarged abdominal or pelvic lymph nodes. Reproductive: Calcified fibroid in the uterus. Other: Small amount of interloop free fluid in the right hemiabdomen in the pelvis. No free intraperitoneal air. Musculoskeletal: Thoracolumbar spondylosis. Superior endplate compression deformity of L3 is new since 2021 but favored chronic. Bilateral femoral head AVN. No acute osseous abnormality. IMPRESSION: 1. Moderate to high-grade small bowel obstruction with transition point in the right lower abdomen. 2. Heterogenous mass along the lesser curvature of the stomach is unchanged from 2021 and may represent a GI stromal tumor, less likely lymphoma. 3. Additional chronic findings as described. Electronically Signed   By: Placido Sou M.D.   On: 08/08/2022 21:37   (Echo, Carotid, EGD, Colonoscopy, ERCP)    Subjective: Patient seen and examined in the morning rounds.  Daughter at the bedside.  Case discussed with surgery.  Patient was eating breakfast.  Still has frequent bowel movements but much better than yesterday.  Denies any abdominal pain or nausea.   Discharge Exam: Vitals:   08/13/22 2026 08/14/22 0456  BP: (!) 151/55 (!) 163/63  Pulse: 77 74  Resp: 18 16  Temp: 97.6 F (36.4 C) 98.5 F (36.9 C)  SpO2: 97% 97%   Vitals:   08/13/22 0510 08/13/22 1344 08/13/22 2026  08/14/22 0456  BP: (!) 159/86 (!) 199/83 (!) 151/55 (!) 163/63  Pulse: 75 80 77 74  Resp: 18 16 18 16   Temp: 97.6 F (36.4 C) 98.8 F (37.1 C) 97.6 F (36.4 C) 98.5 F (36.9 C)  TempSrc: Oral   Oral  SpO2: 96% 98% 97% 97%  Weight:      Height:        General: Pt is alert, awake, not in acute distress Patient is oriented x 4.  She looks younger than her stated age. Cardiovascular: RRR, S1/S2 +, no rubs, no gallops Respiratory: CTA bilaterally, no wheezing, no rhonchi Abdominal: Soft, NT, ND, bowel sounds + Extremities: no edema, no cyanosis    The results of significant diagnostics from this hospitalization (including imaging, microbiology, ancillary and laboratory) are listed below for reference.     Microbiology: Recent Results (from the past 240 hour(s))  Resp panel by RT-PCR (RSV, Flu A&B, Covid) Anterior Nasal Swab     Status: None   Collection Time: 08/08/22  5:15 PM   Specimen: Anterior Nasal Swab  Result Value Ref Range Status   SARS Coronavirus 2 by RT PCR NEGATIVE NEGATIVE Final    Comment: (NOTE) SARS-CoV-2 target nucleic acids are NOT DETECTED.  The SARS-CoV-2 RNA is generally detectable in upper respiratory specimens during the acute phase of infection. The lowest concentration of SARS-CoV-2 viral copies  this assay can detect is 138 copies/mL. A negative result does not preclude SARS-Cov-2 infection and should not be used as the sole basis for treatment or other patient management decisions. A negative result may occur with  improper specimen collection/handling, submission of specimen other than nasopharyngeal swab, presence of viral mutation(s) within the areas targeted by this assay, and inadequate number of viral copies(<138 copies/mL). A negative result must be combined with clinical observations, patient history, and epidemiological information. The expected result is Negative.  Fact Sheet for Patients:   EntrepreneurPulse.com.au  Fact Sheet for Healthcare Providers:  IncredibleEmployment.be  This test is no t yet approved or cleared by the [place] FDA and  has been authorized for detection and/or diagnosis of SARS-CoV-2 by FDA under an Emergency Use Authorization (EUA). This EUA will remain  in effect (meaning this test can be used) for the duration of the COVID-19 declaration under Section 564(b)(1) of the Act, 21 U.S.C.section 360bbb-3(b)(1), unless the authorization is terminated  or revoked sooner.       Influenza A by PCR NEGATIVE NEGATIVE Final   Influenza B by PCR NEGATIVE NEGATIVE Final    Comment: (NOTE) The Xpert Xpress SARS-CoV-2/FLU/RSV plus assay is intended as an aid in the diagnosis of influenza from Nasopharyngeal swab specimens and should not be used as a sole basis for treatment. Nasal washings and aspirates are unacceptable for Xpert Xpress SARS-CoV-2/FLU/RSV testing.  Fact Sheet for Patients: EntrepreneurPulse.com.au  Fact Sheet for Healthcare Providers: IncredibleEmployment.be  This test is not yet approved or cleared by the [place] FDA and has been authorized for detection and/or diagnosis of SARS-CoV-2 by FDA under an Emergency Use Authorization (EUA). This EUA will remain in effect (meaning this test can be used) for the duration of the COVID-19 declaration under Section 564(b)(1) of the Act, 21 U.S.C. section 360bbb-3(b)(1), unless the authorization is terminated or revoked.     Resp Syncytial Virus by PCR NEGATIVE NEGATIVE Final    Comment: (NOTE) Fact Sheet for Patients: EntrepreneurPulse.com.au  Fact Sheet for Healthcare Providers: IncredibleEmployment.be  This test is not yet approved or cleared by the [place] FDA and has been authorized for detection and/or diagnosis of SARS-CoV-2 by FDA under an Emergency Use  Authorization (EUA). This EUA will remain in effect (meaning this test can be used) for the duration of the COVID-19 declaration under Section 564(b)(1) of the Act, 21 U.S.C. section 360bbb-3(b)(1), unless the authorization is terminated or revoked.  Performed at [facility], [address] [place], [place] [address]      Labs: BNP (last 3 results) Recent Labs    09/20/21 0338  BNP 938.1*   Basic Metabolic Panel: Recent Labs  Lab 08/09/22 0619 08/10/22 0546 08/11/22 0542 08/12/22 0609 08/13/22 1035  NA 137 140 136 138 133*  K 3.5 2.9* 3.3* 2.6* 5.3*  CL 104 104 105 107 101  CO2 25 27 23  21* 27  GLUCOSE 127* 108* 75 127* 122*  BUN 14 17 18 15  <5*  CREATININE 0.61 0.50 0.51 0.52 0.46  CALCIUM 9.8 10.0 9.6 9.4 9.9  MG  --   --  2.0 1.8  --    Liver Function Tests: Recent Labs  Lab 08/08/22 1715 08/09/22 0619 08/12/22 0609  AST 19 25 33  ALT 10 13 16   ALKPHOS 104 85 67  BILITOT 0.6 0.8 0.9  PROT 7.7 6.5 5.7*  ALBUMIN 4.4 3.4* 3.1*   Recent Labs  Lab 08/08/22 1715  LIPASE 33  No results for input(s): "AMMONIA" in the last 168 hours. CBC: Recent Labs  Lab 08/08/22 1715 08/09/22 0619 08/10/22 0546 08/12/22 0839 08/13/22 1035  WBC 16.0* 10.2 9.4 9.0 9.6  NEUTROABS 14.7*  --   --  6.8 6.7  HGB 14.1 12.8 13.5 11.8* 13.2  HCT 41.9 38.8 41.0 36.1 39.4  MCV 93.9 95.8 96.0 96.5 94.9  PLT 296 245 255 206 280   Cardiac Enzymes: No results for input(s): "CKTOTAL", "CKMB", "CKMBINDEX", "TROPONINI" in the last 168 hours. BNP: Invalid input(s): "POCBNP" CBG: Recent Labs  Lab 08/13/22 1209 08/13/22 1841 08/13/22 2340 08/14/22 0457 08/14/22 1150  GLUCAP 134* 151* 121* 137* 113*   D-Dimer No results for input(s): "DDIMER" in the last 72 hours. Hgb A1c No results for input(s): "HGBA1C" in the last 72 hours. Lipid Profile No results for input(s): "CHOL", "HDL", "LDLCALC", "TRIG", "CHOLHDL", "LDLDIRECT" in the last 72  hours. Thyroid function studies No results for input(s): "TSH", "T4TOTAL", "T3FREE", "THYROIDAB" in the last 72 hours.  Invalid input(s): "FREET3" Anemia work up No results for input(s): "VITAMINB12", "FOLATE", "FERRITIN", "TIBC", "IRON", "RETICCTPCT" in the last 72 hours. Urinalysis    Component Value Date/Time   COLORURINE YELLOW 08/09/2022 0110   APPEARANCEUR CLEAR 08/09/2022 0110   LABSPEC <=1.005 08/09/2022 0110   PHURINE 6.0 08/09/2022 0110   GLUCOSEU NEGATIVE 08/09/2022 0110   GLUCOSEU NEGATIVE 11/18/2020 1218   HGBUR SMALL (A) 08/09/2022 0110   BILIRUBINUR NEGATIVE 08/09/2022 0110   KETONESUR 15 (A) 08/09/2022 0110   PROTEINUR NEGATIVE 08/09/2022 0110   UROBILINOGEN 0.2 11/18/2020 1218   NITRITE NEGATIVE 08/09/2022 0110   LEUKOCYTESUR NEGATIVE 08/09/2022 0110   Sepsis Labs Recent Labs  Lab 08/09/22 0619 08/10/22 0546 08/12/22 0839 08/13/22 1035  WBC 10.2 9.4 9.0 9.6   Microbiology Recent Results (from the past 240 hour(s))  Resp panel by RT-PCR (RSV, Flu A&B, Covid) Anterior Nasal Swab     Status: None   Collection Time: 08/08/22  5:15 PM   Specimen: Anterior Nasal Swab  Result Value Ref Range Status   SARS Coronavirus 2 by RT PCR NEGATIVE NEGATIVE Final    Comment: (NOTE) SARS-CoV-2 target nucleic acids are NOT DETECTED.  The SARS-CoV-2 RNA is generally detectable in upper respiratory specimens during the acute phase of infection. The lowest concentration of SARS-CoV-2 viral copies this assay can detect is 138 copies/mL. A negative result does not preclude SARS-Cov-2 infection and should not be used as the sole basis for treatment or other patient management decisions. A negative result may occur with  improper specimen collection/handling, submission of specimen other than nasopharyngeal swab, presence of viral mutation(s) within the areas targeted by this assay, and inadequate number of viral copies(<138 copies/mL). A negative result must be combined  with clinical observations, patient history, and epidemiological information. The expected result is Negative.  Fact Sheet for Patients:  EntrepreneurPulse.com.au  Fact Sheet for Healthcare Providers:  IncredibleEmployment.be  This test is no t yet approved or cleared by the [place] FDA and  has been authorized for detection and/or diagnosis of SARS-CoV-2 by FDA under an Emergency Use Authorization (EUA). This EUA will remain  in effect (meaning this test can be used) for the duration of the COVID-19 declaration under Section 564(b)(1) of the Act, 21 U.S.C.section 360bbb-3(b)(1), unless the authorization is terminated  or revoked sooner.       Influenza A by PCR NEGATIVE NEGATIVE Final   Influenza B by PCR NEGATIVE NEGATIVE Final    Comment: (NOTE) The  Xpert Xpress SARS-CoV-2/FLU/RSV plus assay is intended as an aid in the diagnosis of influenza from Nasopharyngeal swab specimens and should not be used as a sole basis for treatment. Nasal washings and aspirates are unacceptable for Xpert Xpress SARS-CoV-2/FLU/RSV testing.  Fact Sheet for Patients: EntrepreneurPulse.com.au  Fact Sheet for Healthcare Providers: IncredibleEmployment.be  This test is not yet approved or cleared by the [place] FDA and has been authorized for detection and/or diagnosis of SARS-CoV-2 by FDA under an Emergency Use Authorization (EUA). This EUA will remain in effect (meaning this test can be used) for the duration of the COVID-19 declaration under Section 564(b)(1) of the Act, 21 U.S.C. section 360bbb-3(b)(1), unless the authorization is terminated or revoked.     Resp Syncytial Virus by PCR NEGATIVE NEGATIVE Final    Comment: (NOTE) Fact Sheet for Patients: EntrepreneurPulse.com.au  Fact Sheet for Healthcare Providers: IncredibleEmployment.be  This test is not yet approved  or cleared by the [place] FDA and has been authorized for detection and/or diagnosis of SARS-CoV-2 by FDA under an Emergency Use Authorization (EUA). This EUA will remain in effect (meaning this test can be used) for the duration of the COVID-19 declaration under Section 564(b)(1) of the Act, 21 U.S.C. section 360bbb-3(b)(1), unless the authorization is terminated or revoked.  Performed at [facility], [address] [place], [place] [address]      Time coordinating discharge:  35 minutes  SIGNED:   Barb Merino, MD  [facility] 08/14/2022, 1:36 PM

## 2022-08-14 NOTE — Progress Notes (Signed)
Pt was discharged home today. Instructions were reviewed with patient, and questions were answered. Pt was taken to main entrance via wheelchair by NT.  

## 2022-08-14 NOTE — Plan of Care (Signed)

## 2022-08-14 NOTE — TOC Transition Note (Signed)
Transition of Care [facility]) - CM/SW Discharge Note  Patient Details  Name: Linda [name] MRN: [id number] Date of Birth: Mar 10, 1924  Transition of Care [facility]) CM/SW Contact:  Sherie Don, LCSW Phone Number: 08/14/2022, 2:00 PM  Clinical Narrative: Patient is medically ready to return to [facility]. PT evaluation recommended HHPT. CSW spoke with Truddie Crumble at [facility] to confirm patient's return to the ALF and, per [facility], PT can be provided onsite. CSW faxed discharge summary, [place] orders/face-to-face, and [phone]) to [facility]. Requested changes have been made. CSW updated treatment team. TOC signing off.   Final next level of care: Assisted Living Barriers to Discharge: Barriers Resolved  Discharge Plan and Services Additional resources added to the After Visit Summary for   In-house Referral: Clinical Social Work    DME Arranged: N/A DME Agency: NA  Social Determinants of Health ([place]) Interventions [place]: No Food Insecurity (08/09/2022)  Housing: Low Risk  (08/09/2022)  Transportation Needs: No Transportation Needs (08/09/2022)  Utilities: Not At Risk (08/09/2022)  Depression (PHQ2-9): Low Risk  (08/12/2021)  Financial Resource Strain: Low Risk  (08/12/2021)  Physical Activity: Inactive (08/12/2021)  Social Connections: Socially Isolated (08/12/2021)  Stress: No Stress Concern Present (08/12/2021)  Tobacco Use: Medium Risk (08/08/2022)   Readmission Risk Interventions     No data to display

## 2022-08-14 NOTE — [place] FL2 (Signed)
[place] MEDICAID FL2 LEVEL OF CARE FORM     IDENTIFICATION  Patient Name: Linda [name] Birthdate: [birth year] Sex: female Admission Date (Current Location): 08/08/2022  [facility] and [place] Number:  Herbalist and Address:  [facility],  [place], [place]      Provider Number: [id number]  Attending Physician Name and Address:  Barb Merino, MD  Relative Name and Phone Number:  Preet Mangano (daughter) Ph: [phone]    Current Level of Care: Hospital Recommended Level of Care: [place] Prior Approval Number:    Date Approved/Denied:   PASRR Number:    Discharge Plan: SNF    Current Diagnoses: Patient Active Problem List   Diagnosis Date Noted   Mass of stomach 08/09/2022   Leukocytosis 08/09/2022   Hyponatremia 08/09/2022   SBO (small bowel obstruction) ([place]) 08/08/2022   Trochanteric bursitis, left hip 06/30/2022   Insomnia 05/18/2022   Lumbar compression fracture ([place]) 01/25/2022   Cerebral thrombosis with cerebral infarction 09/22/2021   Hypokalemia 09/21/2021   Chronic diastolic CHF (congestive heart failure) ([place]) 09/21/2021   COPD (chronic obstructive pulmonary disease) ([place]) 09/21/2021   Osteoarthritis 08/12/2021   Dermatitis 04/13/2021   Multinodular goiter 11/19/2020   Hypercalcemia 11/19/2020   Hyperparathyroidism ([place]) 11/19/2020   Bronchiectasis without acute exacerbation ([place]) 07/14/2019   Chronic cough 02/03/2019   Thoracic back pain 10/13/2018   Hyperlipidemia 10/13/2018   Vitamin D deficiency 10/13/2018   Gastroesophageal reflux disease without esophagitis 10/13/2018   Chronic midline thoracic back pain 10/13/2018   Osteoporosis without current pathological fracture 10/13/2018   HTN (hypertension) 10/13/2018   Stenosis of carotid artery 10/13/2018   History of thyroid nodule, s/p bx, benign 10/13/2018   Aortic cusp regurgitation 10/13/2018   Nonrheumatic tricuspid valve  regurgitation 10/13/2018   Coccyalgia 10/13/2018   Irritable bowel syndrome 10/13/2018   Bilateral hearing loss 10/13/2018   Chronic rhinitis 10/13/2018   PAT (paroxysmal atrial tachycardia) 10/13/2018    Orientation RESPIRATION BLADDER Height & Weight     Self, Place, Time  Normal Continent Weight: 117 lb 1 oz (53.1 kg) Height:  5\' 4"  (162.6 cm)  BEHAVIORAL SYMPTOMS/MOOD NEUROLOGICAL BOWEL NUTRITION STATUS   (N/A)  (N/A) Continent Diet (Soft diet)  AMBULATORY STATUS COMMUNICATION OF NEEDS Skin   Limited Assist Verbally Normal                       Personal Care Assistance Level of Assistance  Bathing, Feeding, Dressing Bathing Assistance: Limited assistance Feeding assistance: Independent Dressing Assistance: Limited assistance     Functional Limitations Info  Sight, Hearing, Speech Sight Info: Impaired Hearing Info: Impaired Speech Info: Adequate    SPECIAL CARE FACTORS FREQUENCY                       Contractures Contractures Info: Not present    Additional Factors Info  Code Status, Allergies Code Status Info: DNR Allergies Info: NKA           Current Medications (08/14/2022):  This is the current hospital active medication list Current Facility-Administered Medications  Medication Dose Route Frequency Provider Last Rate Last Admin   acetaminophen (TYLENOL) tablet 650 mg  650 mg Oral Q6H PRN Rolm Bookbinder, MD       amLODipine [facility]) tablet 2.5 mg  2.5 mg Oral Daily Barb Merino, MD   2.5 mg at 08/14/22 1008   aspirin EC tablet 81 mg  81 mg Oral  Daily Barb Merino, MD   81 mg at 08/14/22 1008   calcium carbonate (TUMS - dosed in mg elemental calcium) chewable tablet 750 mg  750 mg Oral Daily PRN Barb Merino, MD       cholecalciferol (VITAMIN D3) tablet 1,000 Units  1,000 Units Oral BID Barb Merino, MD   1,000 Units at 08/14/22 1009   dextrose 50 % solution 12.5 g  12.5 g Intravenous PRN Shela Leff, MD   12.5 g at 08/12/22  0548   famotidine (PEPCID) tablet 20 mg  20 mg Oral BID Barb Merino, MD   20 mg at 08/14/22 1008   lidocaine (LIDODERM) 5 % 1 patch  1 patch Transdermal QHS Dessa Phi, DO   1 patch at 08/13/22 2224   mirtazapine (REMERON) tablet 30 mg  30 mg Oral QHS Barb Merino, MD   30 mg at 08/13/22 2223   morphine (PF) 2 MG/ML injection 1 mg  1 mg Intravenous Q4H PRN Shela Leff, MD       naloxone [facility]) injection 0.4 mg  0.4 mg Intravenous PRN Shela Leff, MD       ondansetron [facility]) injection 4 mg  4 mg Intravenous Q6H PRN Dessa Phi, DO   4 mg at 08/09/22 0726   sotalol (BETAPACE) tablet 80 mg  80 mg Oral Q12H Barb Merino, MD   80 mg at 08/14/22 1008   timolol (TIMOPTIC) 0.25 % ophthalmic solution 1 drop  1 drop Both Eyes Daily Jill Alexanders, PA-C   1 drop at 08/14/22 1009     Discharge Medications: Please see discharge summary for a list of discharge medications.  Relevant Imaging Results:  Relevant Lab Results:   Additional Information SSN: [id number]  Sherie Don, LCSW

## 2022-08-17 ENCOUNTER — Telehealth: Payer: Self-pay | Admitting: Family Medicine

## 2022-08-17 ENCOUNTER — Ambulatory Visit: Payer: Medicare Other

## 2022-08-17 NOTE — Telephone Encounter (Signed)
Caller states; - Pt was discharged from hospital on 08/14/22 after having small bowel obstruction - Pt hasn't had a BM since discharge - Patient is not with caller but can answer questions  I informed triage of situation. Triage nurse verbalized understanding. Caller has been transferred.

## 2022-08-18 NOTE — Telephone Encounter (Signed)
Pt triaged and advised to see PCP within 24 hrs

## 2022-08-18 NOTE — Telephone Encounter (Signed)
Pt advised to see PCP within 24 hrs. No available appts. Will call to follow up with Pt.  Patient Name: Linda [name] Gender: Female DOB: 1924-03-13 Age: 89 Y 38 M 6 D Return Phone Number: [phone] (Primary), [phone] (Secondary) Address: City/ State/ Zip: [place]  [address] Client [place] [facility] [place] [facility] [place] [facility] Provider Dimas Chyle- MD Contact Type Call Who Is Calling Patient / Member / Family / Caregiver Call Type Triage / Clinical Caller Name Linda [name] Relationship To Patient Daughter Return Phone Number [phone] (Primary) Chief Complaint Constipation Reason for Call Symptomatic / Request for [place] with the office - no appt today caller states her mother was released from hospital on 01/01 for bowel obstruction and has been constipated since then. Daughter is not with mother at time of call but wants to speak with nurse and then she can connect her mother. Translation No Nurse Assessment Nurse: Linda Pang, RN, Linda [name] Date/Time (Eastern Time): 08/17/2022 10:36:41 AM Confirm and document reason for call. If symptomatic, describe symptoms. ---The caller states that her mother was in the hospital with SBO. Resolved with NG tube and bowel rest. States that her mother is worried about constipation. No BM since Monday. Eating normally - soft diet. No pain and abdomen is soft. She states that her mother has not had a BM since she was in the hospital and is concerned. Feeling good. Does the patient have any new or worsening symptoms? ---Yes Will a triage be completed? ---Yes Related visit to physician within the last 2 weeks? ---Yes Does the PT have any chronic conditions? (i.e. diabetes, asthma, this includes High risk factors for pregnancy, etc.) ---Yes List chronic conditions. ---stroke Is this a behavioral health or substance abuse call?  ---No Guidelines Guideline Title Affirmed Question Affirmed Notes Nurse Date/Time (Eastern Time) Constipation Abdomen is more swollen than usual [name], RN, Linda [name] 08/17/2022 10:48:51 AM Disp. Time Linda [name] Time) Disposition Final User 08/17/2022 10:53:24 AM See PCP within 24 Hours Yes Linda Pang, RN, Linda [name] Final Disposition 08/17/2022 10:53:24 AM See PCP within 24 Hours Yes Linda Pang, RN, Linda [name] Disagree/Comply Comply Caller Understands Yes PreDisposition Call Doctor Care Advice Given Per Guideline SEE PCP WITHIN 24 HOURS: * Drink enough liquids. * MILK OF MAGNESIA (MAGNESIUM HYDROXIDE): This is a mild and generally safe laxative. You can use Milk of Magnesia for short-term treatment of constipation. Dosage is 2 tablespoons (30 ml) by mouth. Do not use if you have kidney disease. * Miralax may be more effective than Milk of Magnesia. * Constant abdomen pain lasting over 2 hours * Vomiting occurs Comments User: Linda Buggy, RN Date/Time Linda [name] Time): 08/17/2022 10:56:41 AM Caller has not had a BM since 12/29 but did have contrast via NG tube that had laxative effect. This nurse informed her that was not unusual to go this long after having liquid stools. She has had Miralax one day. Denies abdominal pain and is passing gas. She states that her abdomen feels hard and is larger. This nurse advised her to take MOM 2 Tbsp and drink liquids (not in excess) and if she worsens she was instructed to call back and she and her daughter verbalized understanding. Referrals GO TO FACILITY UNDECIDED

## 2022-08-21 ENCOUNTER — Telehealth: Payer: Self-pay | Admitting: Family Medicine

## 2022-08-21 NOTE — Telephone Encounter (Signed)
Erin with [facility] states Patient is refusing PT services.

## 2022-08-22 NOTE — Telephone Encounter (Signed)
FYI

## 2022-08-31 ENCOUNTER — Ambulatory Visit (INDEPENDENT_AMBULATORY_CARE_PROVIDER_SITE_OTHER): Payer: Medicare Other | Admitting: Family Medicine

## 2022-08-31 ENCOUNTER — Encounter: Payer: Self-pay | Admitting: Family Medicine

## 2022-08-31 VITALS — BP 132/72 | HR 71 | Temp 97.7°F | Ht 64.0 in | Wt 115.6 lb

## 2022-08-31 DIAGNOSIS — I10 Essential (primary) hypertension: Secondary | ICD-10-CM | POA: Diagnosis not present

## 2022-08-31 DIAGNOSIS — S32000G Wedge compression fracture of unspecified lumbar vertebra, subsequent encounter for fracture with delayed healing: Secondary | ICD-10-CM | POA: Diagnosis not present

## 2022-08-31 DIAGNOSIS — G4700 Insomnia, unspecified: Secondary | ICD-10-CM

## 2022-08-31 DIAGNOSIS — E782 Mixed hyperlipidemia: Secondary | ICD-10-CM

## 2022-08-31 NOTE — Assessment & Plan Note (Signed)
Overall symptoms are stable.  She is on Remeron 30 mg nightly.  We had discussed starting Cymbalta at her last visit and we can readdress this today.  Patient is reluctant to do this at this point.  Discussed with patient that may help improve her mood and also help with some of her chronic pain however she would like to defer for now.  We can readdress again at next visit in 4 months.  They will let me know if she would like to make any adjustments in the meantime.  We did also discuss referral to see a counselor or behavioral specialist however she deferred.

## 2022-08-31 NOTE — Assessment & Plan Note (Signed)
Overall symptoms are stable.  She did ask about going back on oxycodone however recommended against this at this point due to side effect profile.  She can continue using her tramadol as needed.  She can follow-up with orthopedics if this continues to be an issue.  Will follow-up again in 4 months.

## 2022-08-31 NOTE — Progress Notes (Signed)
Linda [name] is a 89 y.o. female who presents today for an office visit.  Assessment/Plan:  New/Acute Problems: SBO Doing well.  No signs of recurrence.  Discussed reasons to return to care.  Skin lesion No red flags.  Possibly seborrheic keratosis.  They can try over-the-counter cortisone cream.  They will let me know if this continues to be an issue and will consider trial of cryotherapy.  Chronic Problems Addressed Today: Insomnia Overall symptoms are stable.  She is on Remeron 30 mg nightly.  We had discussed starting Cymbalta at her last visit and we can readdress this today.  Patient is reluctant to do this at this point.  Discussed with patient that may help improve her mood and also help with some of her chronic pain however she would like to defer for now.  We can readdress again at next visit in 4 months.  They will let me know if she would like to make any adjustments in the meantime.  We did also discuss referral to see a counselor or behavioral specialist however she deferred.  HTN (hypertension) Blood pressure at goal on amlodipine 2.5 mg daily.  Lumbar compression fracture (H[facility]) Overall symptoms are stable.  She did ask about going back on oxycodone however recommended against this at this point due to side effect profile.  She can continue using her tramadol as needed.  She can follow-up with orthopedics if this continues to be an issue.  Will follow-up again in 4 months.     Subjective:  HPI:  See A/p for status of chronic conditions.  She was seen last here 3 months ago.   Since her last visit she was admitted for small bowel obstruction.  She presented to the ED on 07/19/2022 with abdominal pain, nausea, and constipation.  Treated conservatively.  Did well and diet was advanced.  She tolerated well.  Her home medications were continued and she was discharged home on 08/14/2022.  She is doing reasonably well today.  She does have a few issues with chronic pain that  seems to be at baseline.  She is also noticed a new lesion on her neck for the last few months.  It has not changed over that time.  Occasionally some flaking.  No itching.  She does not wish to make any medication changes today.  She is not happy with the food that is being provided to her at her facility.        Objective:  Physical Exam: BP 132/72   Pulse 71   Temp 97.7 F (36.5 C) (Temporal)   Ht 5\' 4"  (1.626 m)   Wt 115 lb 9.6 oz (52.4 kg)   SpO2 95%   BMI 19.84 kg/m   Wt Readings from Last 3 Encounters:  08/31/22 115 lb 9.6 oz (52.4 kg)  08/08/22 117 lb 1 oz (53.1 kg)  06/30/22 117 lb (53.1 kg)    Gen: No acute distress, resting comfortably CV: Regular rate and rhythm with no murmurs appreciated Pulm: Normal work of breathing, clear to auscultation bilaterally with no crackles, wheezes, or rhonchi Skin: Flat approximately 1 cm hyperpigmented area on left neck. Neuro: Grossly normal, moves all extremities Psych: Normal affect and thought content  Time Spent: 40 minutes of total time was spent on the date of the encounter performing the following actions: chart review prior to seeing the patient including recent hospitalization, obtaining history, performing a medically necessary exam, counseling on the treatment plan, placing orders, and documenting in  our EHR.        Algis Greenhouse. Jerline Pain, MD 08/31/2022 1:06 PM

## 2022-08-31 NOTE — Assessment & Plan Note (Signed)
Blood pressure at goal on amlodipine 2.5 mg daily.

## 2022-08-31 NOTE — Patient Instructions (Signed)
It was very nice to see you today!  No medication changes today.  Please let me know if you would like to start the Cymbalta or make a medication change to your MRI.  We will see back in 4 months.  Come back sooner if needed.  Take care, Dr Jerline Pain  PLEASE NOTE:  If you had any lab tests, please let us know if you have not heard back within a few days. You may see your results on mychart before we have a chance to review them but we will give you a call once they are reviewed by [place].   If we ordered any referrals today, please let us know if you have not heard from their office within the next week.   If you had any urgent prescriptions sent in today, please check with the pharmacy within an hour of our visit to make sure the prescription was transmitted appropriately.   Please try these tips to maintain a healthy lifestyle:  Eat at least 3 REAL meals and 1-2 snacks per day.  Aim for no more than 5 hours between eating.  If you eat breakfast, please do so within one hour of getting up.   Each meal should contain half fruits/vegetables, one quarter protein, and one quarter carbs (no bigger than a computer mouse)  Cut down on sweet beverages. This includes juice, soda, and sweet tea.   Drink at least 1 glass of water with each meal and aim for at least 8 glasses per day  Exercise at least 150 minutes every week.

## 2022-09-19 ENCOUNTER — Ambulatory Visit: Payer: Medicare Other

## 2022-10-04 ENCOUNTER — Ambulatory Visit (HOSPITAL_BASED_OUTPATIENT_CLINIC_OR_DEPARTMENT_OTHER): Payer: Medicare Other | Admitting: Cardiology

## 2022-10-31 ENCOUNTER — Ambulatory Visit (INDEPENDENT_AMBULATORY_CARE_PROVIDER_SITE_OTHER): Payer: Medicare Other

## 2022-10-31 VITALS — BP 128/74 | HR 69 | Temp 97.3°F | Wt 115.8 lb

## 2022-10-31 DIAGNOSIS — Z0000 Encounter for general adult medical examination without abnormal findings: Secondary | ICD-10-CM | POA: Diagnosis not present

## 2022-10-31 NOTE — Patient Instructions (Signed)
Linda [name] , Thank you for taking time to come for your Medicare Wellness Visit. I appreciate your ongoing commitment to your health goals. Please review the following plan we discussed and let me know if I can assist you in the future.   These are the goals we discussed:  Goals      Patient Stated     Walk more         This is a list of the screening recommended for you and due dates:  Health Maintenance  Topic Date Due   DTaP/Tdap/Td vaccine (1 - Tdap) Never done   COVID-19 Vaccine (6 - 2023-24 season) 07/02/2022   Medicare Annual Wellness Visit  08/12/2022   Pneumonia Vaccine  Completed   Flu Shot  Completed   Zoster (Shingles) Vaccine  Completed   HPV Vaccine  Aged Out   DEXA scan (bone density measurement)  Discontinued    Advanced directives: copies in chart   Conditions/risks identified: walk every day   Next appointment: Follow up in one year for your annual wellness visit    Preventive Care 65 Years and Older, Female Preventive care refers to lifestyle choices and visits with your health care provider that can promote health and wellness. What does preventive care include? A yearly physical exam. This is also called an annual well check. Dental exams once or twice a year. Routine eye exams. Ask your health care provider how often you should have your eyes checked. Personal lifestyle choices, including: Daily care of your teeth and gums. Regular physical activity. Eating a healthy diet. Avoiding tobacco and drug use. Limiting alcohol use. Practicing safe sex. Taking low-dose aspirin every day. Taking vitamin and mineral supplements as recommended by your health care provider. What happens during an annual well check? The services and screenings done by your health care provider during your annual well check will depend on your age, overall health, lifestyle risk factors, and family history of disease. Counseling  Your health care provider may ask you questions  about your: Alcohol use. Tobacco use. Drug use. Emotional well-being. Home and relationship well-being. Sexual activity. Eating habits. History of falls. Memory and ability to understand (cognition). Work and work Statistician. Reproductive health. Screening  You may have the following tests or measurements: Height, weight, and BMI. Blood pressure. Lipid and cholesterol levels. These may be checked every 5 years, or more frequently if you are over 36 years old. Skin check. Lung cancer screening. You may have this screening every year starting at age 32 if you have a 30-pack-year history of smoking and currently smoke or have quit within the past 15 years. Fecal occult blood test (FOBT) of the stool. You may have this test every year starting at age 38. Flexible sigmoidoscopy or colonoscopy. You may have a sigmoidoscopy every 5 years or a colonoscopy every 10 years starting at age 77. Hepatitis C blood test. Hepatitis B blood test. Sexually transmitted disease (STD) testing. Diabetes screening. This is done by checking your blood sugar (glucose) after you have not eaten for a while (fasting). You may have this done every 1-3 years. Bone density scan. This is done to screen for osteoporosis. You may have this done starting at age 24. Mammogram. This may be done every 1-2 years. Talk to your health care provider about how often you should have regular mammograms. Talk with your health care provider about your test results, treatment options, and if necessary, the need for more tests. Vaccines  Your health care  provider may recommend certain vaccines, such as: Influenza vaccine. This is recommended every year. Tetanus, diphtheria, and acellular pertussis (Tdap, Td) vaccine. You may need a Td booster every 10 years. Zoster vaccine. You may need this after age 56. Pneumococcal 13-valent conjugate (PCV13) vaccine. One dose is recommended after age 72. Pneumococcal polysaccharide (PPSV23)  vaccine. One dose is recommended after age 50. Talk to your health care provider about which screenings and vaccines you need and how often you need them. This information is not intended to replace advice given to you by your health care provider. Make sure you discuss any questions you have with your health care provider. Document Released: 08/27/2015 Document Revised: 04/19/2016 Document Reviewed: 06/01/2015 Elsevier Interactive Patient Education  2017 [place] Prevention in the Home Falls can cause injuries. They can happen to people of all ages. There are many things you can do to make your home safe and to help prevent falls. What can I do on the outside of my home? Regularly fix the edges of walkways and driveways and fix any cracks. Remove anything that might make you trip as you walk through a door, such as a raised step or threshold. Trim any bushes or trees on the path to your home. Use bright outdoor lighting. Clear any walking paths of anything that might make someone trip, such as rocks or tools. Regularly check to see if handrails are loose or broken. Make sure that both sides of any steps have handrails. Any raised decks and porches should have guardrails on the edges. Have any leaves, snow, or ice cleared regularly. Use sand or salt on walking paths during winter. Clean up any spills in your garage right away. This includes oil or grease spills. What can I do in the bathroom? Use night lights. Install grab bars by the toilet and in the tub and shower. Do not use towel bars as grab bars. Use non-skid mats or decals in the tub or shower. If you need to sit down in the shower, use a plastic, non-slip stool. Keep the floor dry. Clean up any water that spills on the floor as soon as it happens. Remove soap buildup in the tub or shower regularly. Attach bath mats securely with double-sided non-slip rug tape. Do not have throw rugs and other things on the floor that  can make you trip. What can I do in the bedroom? Use night lights. Make sure that you have a light by your bed that is easy to reach. Do not use any sheets or blankets that are too big for your bed. They should not hang down onto the floor. Have a firm chair that has side arms. You can use this for support while you get dressed. Do not have throw rugs and other things on the floor that can make you trip. What can I do in the kitchen? Clean up any spills right away. Avoid walking on wet floors. Keep items that you use a lot in easy-to-reach places. If you need to reach something above you, use a strong step stool that has a grab bar. Keep electrical cords out of the way. Do not use floor polish or wax that makes floors slippery. If you must use wax, use non-skid floor wax. Do not have throw rugs and other things on the floor that can make you trip. What can I do with my stairs? Do not leave any items on the stairs. Make sure that there are handrails on both  sides of the stairs and use them. Fix handrails that are broken or loose. Make sure that handrails are as long as the stairways. Check any carpeting to make sure that it is firmly attached to the stairs. Fix any carpet that is loose or worn. Avoid having throw rugs at the top or bottom of the stairs. If you do have throw rugs, attach them to the floor with carpet tape. Make sure that you have a light switch at the top of the stairs and the bottom of the stairs. If you do not have them, ask someone to add them for you. What else can I do to help prevent falls? Wear shoes that: Do not have high heels. Have rubber bottoms. Are comfortable and fit you well. Are closed at the toe. Do not wear sandals. If you use a stepladder: Make sure that it is fully opened. Do not climb a closed stepladder. Make sure that both sides of the stepladder are locked into place. Ask someone to hold it for you, if possible. Clearly mark and make sure that you  can see: Any grab bars or handrails. First and last steps. Where the edge of each step is. Use tools that help you move around (mobility aids) if they are needed. These include: Canes. Walkers. Scooters. Crutches. Turn on the lights when you go into a dark area. Replace any light bulbs as soon as they burn out. Set up your furniture so you have a clear path. Avoid moving your furniture around. If any of your floors are uneven, fix them. If there are any pets around you, be aware of where they are. Review your medicines with your doctor. Some medicines can make you feel dizzy. This can increase your chance of falling. Ask your doctor what other things that you can do to help prevent falls. This information is not intended to replace advice given to you by your health care provider. Make sure you discuss any questions you have with your health care provider. Document Released: 05/27/2009 Document Revised: 01/06/2016 Document Reviewed: 09/04/2014 Elsevier Interactive Patient Education  2017 [employer].

## 2022-10-31 NOTE — Progress Notes (Signed)
Subjective:   Linda [name] is a 89 y.o. female who presents for Medicare Annual (Subsequent) preventive examination.  Review of Systems     Cardiac Risk Factors include: advanced age (>47men, >66 women);dyslipidemia;hypertension     Objective:    Today's Vitals   10/31/22 1113  BP: 128/74  Pulse: 69  Temp: (!) 97.3 F (36.3 C)  SpO2: 96%  Weight: 115 lb 12.8 oz (52.5 kg)   Body mass index is 19.88 kg/m.     10/31/2022   11:26 AM 08/08/2022    5:16 PM 05/12/2022   10:40 AM 11/08/2021   11:15 AM 09/13/2021   10:39 PM 08/12/2021    2:40 PM 12/25/2019   10:42 AM  Advanced Directives  Does Patient Have a Medical Advance Directive? Yes No Yes Yes Yes Yes Yes  Type of Paramedic of [place];Living will   [place];Living will Healthcare Power of [place]  Does patient want to make changes to medical advance directive? No - Patient declined      No - Patient declined  Copy of [place] in Chart? Yes - validated most recent copy scanned in chart (See row information)     Yes - validated most recent copy scanned in chart (See row information) No - copy requested  Would patient like information on creating a medical advance directive?  No - Patient declined         Current Medications (verified) Outpatient Encounter Medications as of 10/31/2022  Medication Sig   acetaminophen (TYLENOL) 500 MG tablet Take 1,000 mg by mouth every 6 (six) hours as needed for mild pain.   amLODipine (NORVASC) 2.5 MG tablet Take 1 tablet (2.5 mg total) by mouth daily.   aspirin EC 81 MG EC tablet Take 1 tablet (81 mg total) by mouth daily. Swallow whole.   Cholecalciferol (VITAMIN D3) 25 MCG (1000 UT) CAPS Take 1,000 Units by mouth 2 (two) times daily.   famotidine (PEPCID) 20 MG tablet Take 1 tablet (20 mg total) by mouth 2 (two) times daily.   INFANTS SIMETHICONE 20  MG/0.3ML drops Take 40 mg by mouth 2 (two) times daily.   lidocaine (LIDODERM) 5 % Place 1 patch onto the skin daily.   mirtazapine (REMERON) 30 MG tablet Take 30 mg by mouth at bedtime.   polyethylene glycol (MIRALAX / GLYCOLAX) 17 g packet Take 17 g by mouth daily.   rosuvastatin (CRESTOR) 20 MG tablet TAKE 1 TABLET DAILY   sotalol (BETAPACE) 80 MG tablet TAKE 1 TABLET EVERY 12 HOURS (Patient taking differently: Take 80 mg by mouth 2 (two) times daily.)   timolol (TIMOPTIC) 0.25 % ophthalmic solution Place 1 drop into both eyes daily.   traMADol (ULTRAM) 50 MG tablet Take 0.5-1 tablets (25-50 mg total) by mouth every 6 (six) hours as needed. (Patient taking differently: Take 25-50 mg by mouth every 6 (six) hours as needed for moderate pain.)   No facility-administered encounter medications on file as of 10/31/2022.    Allergies (verified) Patient has no known allergies.   History: Past Medical History:  Diagnosis Date   Arthritis    COPD (chronic obstructive pulmonary disease) (HCC)    chronic cough   Glaucoma    Hyperlipidemia    Hypertension    Irregular heartbeat    Osteoporosis    Thyroid disease    enlarged thyroid   Past Surgical History:  Procedure Laterality  Date   APPENDECTOMY     cateract surgery     COLONOSCOPY     Done in [place]  yrs ago   UPPER GASTROINTESTINAL ENDOSCOPY  2019   WRIST SURGERY     Family History  Problem Relation Age of Onset   Stroke Mother    Heart disease Mother    Heart disease Father    Heart disease Brother    Heart disease Brother    Colon cancer Neg Hx    Colon polyps Neg Hx    Esophageal cancer Neg Hx    Social History   Socioeconomic History   Marital status: Widowed    Spouse name: Not on file   Number of children: Not on file   Years of education: Not on file   Highest education level: Not on file  Occupational History   Occupation: Retired  Tobacco Use   Smoking status: Former    Packs/day: 0.25    Years: 2.00     Additional pack years: 0.00    Total pack years: 0.50    Types: Cigarettes    Start date: 65    Quit date: 1952    Years since quitting: 72.2    Passive exposure: Past   Smokeless tobacco: Never  Vaping Use   Vaping Use: Never used  Substance and Sexual Activity   Alcohol use: Not Currently   Drug use: Never   Sexual activity: Not on file  Other Topics Concern   Not on file  Social History Narrative   Resident at [employer]    Enjoys playing cards    Social Determinants of Health   Financial Resource Strain: Low Risk  (10/31/2022)   Overall Financial Resource Strain (CARDIA)    Difficulty of Paying Living Expenses: Not hard at all  Food Insecurity: No Food Insecurity (10/31/2022)   Hunger Vital Sign    Worried About Running Out of Food in the Last Year: Never true    No Name in the Last Year: Never true  Transportation Needs: No Transportation Needs (10/31/2022)   PRAPARE - Hydrologist (Medical): No    Lack of Transportation (Non-Medical): No  Physical Activity: Insufficiently Active (10/31/2022)   Exercise Vital Sign    Days of Exercise per Week: 7 days    Minutes of Exercise per Session: 10 min  Stress: No Stress Concern Present (10/31/2022)   [place]    Feeling of Stress : Only a little  Social Connections: Socially Isolated (10/31/2022)   Social Connection and Isolation Panel [NHANES]    Frequency of Communication with Friends and Family: Three times a week    Frequency of Social Gatherings with Friends and Family: Once a week    Attends Religious Services: Never    Marine scientist or Organizations: No    Attends Archivist Meetings: Never    Marital Status: Widowed    Tobacco Counseling Counseling given: Not Answered   Clinical Intake:  Pre-visit preparation completed: Yes  Pain : No/denies pain     BMI - recorded:  19.88 Nutritional Status: BMI of 19-24  Normal Nutritional Risks: None Diabetes: No  How often do you need to have someone help you when you read instructions, pamphlets, or other written materials from your doctor or pharmacy?: 1 - Never  Diabetic?no  Interpreter Needed?: No  Information entered by :: Charlott Rakes, LPN   Activities  of Daily Living    10/31/2022   11:28 AM 08/09/2022   12:11 AM  In your present state of health, do you have any difficulty performing the following activities:  Hearing? 1 0  Comment wears hearing aids   Vision? 1 0  Comment difficulty with vision   Difficulty concentrating or making decisions? 1 0  Walking or climbing stairs? 1 1  Comment uses a rollator   Dressing or bathing? 1 0  Comment assistance with bathing   Doing errands, shopping? 0 0  Comment daughter assist   Preparing Food and eating ? N   Using the Toilet? N   In the past six months, have you accidently leaked urine? N   Do you have problems with loss of bowel control? N   Managing your Medications? N   Managing your Finances? N   Housekeeping or managing your Housekeeping? N     Patient Care Team: Vivi Barrack, MD as PCP - General (Family Medicine) Buford Dresser, MD as PCP - Cardiology (Cardiology) Collene Gobble, MD as Consulting Physician (Pulmonary Disease) Magnus Sinning, MD as Consulting Physician (Physical Medicine and Rehabilitation) Eunice Blase, MD as Consulting Physician (Family Medicine)  Indicate any recent Medical Services you may have received from other than [facility] providers in the past year (date may be approximate).     Assessment:   This is a routine wellness examination for [place].  Hearing/Vision screen Hearing Screening - Comments:: Wears hearing aids  Vision Screening - Comments:: Pt follows up with Dr Prudencio Burly for annual eye exam  Dietary issues and exercise activities discussed: Current Exercise Habits: Home exercise routine,  Type of exercise: walking, Time (Minutes): 10, Frequency (Times/Week): 7, Weekly Exercise (Minutes/Week): 70   Goals Addressed             This Visit's Progress    Patient Stated       Walk every day        Depression Screen    10/31/2022   11:22 AM 08/31/2022   12:19 PM 08/12/2021    2:38 PM 08/12/2021    2:01 PM 04/13/2021   11:45 AM 11/18/2020   11:36 AM 06/23/2020    9:36 AM  PHQ 2/9 Scores  PHQ - 2 Score 4 6 0 0 5 0 0  PHQ- 9 Score 8 9   7       Fall Risk    10/31/2022   11:27 AM 08/31/2022   11:27 AM 05/12/2022   10:40 AM 11/08/2021   11:14 AM 08/12/2021    2:40 PM  Fall Risk   Falls in the past year? 0 0 0 1 0  Number falls in past yr: 0 0 0 0 0  Injury with Fall? 0 0 0 0 0  Risk for fall due to : Impaired vision;Impaired mobility;Impaired balance/gait Impaired mobility;No Fall Risks   Impaired vision;Impaired balance/gait  Follow up Falls prevention discussed    Falls prevention discussed    FALL RISK PREVENTION PERTAINING TO THE HOME:  Any stairs in or around the home? No  If so, are there any without handrails? No  Home free of loose throw rugs in walkways, pet beds, electrical cords, etc? Yes  Adequate lighting in your home to reduce risk of falls? Yes   ASSISTIVE DEVICES UTILIZED TO PREVENT FALLS:  Life alert? Yes  Use of a cane, walker or w/c? Yes  Grab bars in the bathroom? Yes  Shower chair or bench in shower? Yes  Elevated toilet seat or a handicapped toilet? No   TIMED UP AND GO:  Was the test performed? Yes .  Length of time to ambulate 10 feet: 15 sec.   Gait steady and fast without use of assistive device  Cognitive Function:        10/31/2022   11:34 AM 12/25/2019   10:56 AM  6CIT Screen  What Year? 4 points 0 points  What month? 0 points 0 points  What time? 0 points 0 points  Count back from 20 0 points 0 points  Months in reverse 4 points 0 points  Repeat phrase 4 points 2 points  Total Score 12 points 2 points     Immunizations Immunization History  Administered Date(s) Administered   Fluad Quad(high Dose 65+) 05/18/2022   Influenza, High Dose Seasonal PF 05/15/2019   Influenza-Unspecified 06/04/2018, 05/07/2020, 05/27/2021   PFIZER(Purple Top)SARS-COV-2 Vaccination 08/28/2019, 09/17/2019, 06/02/2020, 02/03/2021   PNEUMOCOCCAL CONJUGATE-20 10/19/2020   Pfizer Covid-19 Vaccine Bivalent Booster 39yrs & up 05/07/2022   Zoster Recombinat (Shingrix) 04/02/2021, 09/11/2021    TDAP status: Due, Education has been provided regarding the importance of this vaccine. Advised may receive this vaccine at local pharmacy or Health Dept. Aware to provide a copy of the vaccination record if obtained from local pharmacy or Health Dept. Verbalized acceptance and understanding.  Flu Vaccine status: Up to date  Pneumococcal vaccine status: Up to date  Covid-19 vaccine status: Completed vaccines  Qualifies for Shingles Vaccine? Yes   Zostavax completed Yes   Shingrix Completed?: Yes  Screening Tests Health Maintenance  Topic Date Due   DTaP/Tdap/Td (1 - Tdap) Never done   COVID-19 Vaccine (6 - 2023-24 season) 07/02/2022   Medicare Annual Wellness (AWV)  10/31/2023   Pneumonia Vaccine 3+ Years old  Completed   INFLUENZA VACCINE  Completed   Zoster Vaccines- Shingrix  Completed   HPV VACCINES  Aged Out   DEXA SCAN  Discontinued    Health Maintenance  Health Maintenance Due  Topic Date Due   DTaP/Tdap/Td (1 - Tdap) Never done   COVID-19 Vaccine (6 - 2023-24 season) 07/02/2022    Colorectal cancer screening: No longer required.   Mammogram status: No longer required due to age.  Bone Density status: Completed 12/22/20. Results reflect: Bone density results: OSTEOPOROSIS. Repeat every as directed  years.    Additional Screening:   Vision Screening: Recommended annual ophthalmology exams for early detection of glaucoma and other disorders of the eye. Is the patient up to date with their  annual eye exam?  Yes  Who is the provider or what is the name of the office in which the patient attends annual eye exams? Dr Prudencio Burly  If pt is not established with a provider, would they like to be referred to a provider to establish care? No .   Dental Screening: Recommended annual dental exams for proper oral hygiene  Community Resource Referral / Chronic Care Management: CRR required this visit?  No   CCM required this visit?  No      Plan:     I have personally reviewed and noted the following in the patient's chart:   Medical and social history Use of alcohol, tobacco or illicit drugs  Current medications and supplements including opioid prescriptions. Patient is currently taking opioid prescriptions. Information provided to patient regarding non-opioid alternatives. Patient advised to discuss non-opioid treatment plan with their provider. Functional ability and status Nutritional status Physical activity Advanced directives List of other physicians Hospitalizations,  surgeries, and ER visits in previous 12 months Vitals Screenings to include cognitive, depression, and falls Referrals and appointments  In addition, I have reviewed and discussed with patient certain preventive protocols, quality metrics, and best practice recommendations. A written personalized care plan for preventive services as well as general preventive health recommendations were provided to patient.     Willette Brace, LPN   [id number]   Nurse Notes: none

## 2022-11-02 ENCOUNTER — Other Ambulatory Visit (HOSPITAL_BASED_OUTPATIENT_CLINIC_OR_DEPARTMENT_OTHER): Payer: Self-pay | Admitting: Cardiology

## 2022-11-02 DIAGNOSIS — I10 Essential (primary) hypertension: Secondary | ICD-10-CM

## 2022-11-02 NOTE — Telephone Encounter (Signed)
Rx(s) sent to pharmacy electronically.  

## 2022-11-03 ENCOUNTER — Telehealth: Payer: Self-pay | Admitting: Family Medicine

## 2022-11-03 NOTE — Telephone Encounter (Signed)
Home Health Certification or Plan of Care Tracking  Is this a Certification or Plan of Care? YES   HH Agency: [facility]   Order Number:  [id number] (pt Id )   Has charge sheet been attached?YES  Where has form been placed:  PROVIDERS BOX  Faxed to:   [phone]

## 2022-11-08 ENCOUNTER — Ambulatory Visit (INDEPENDENT_AMBULATORY_CARE_PROVIDER_SITE_OTHER): Payer: Medicare Other | Admitting: Family

## 2022-11-08 ENCOUNTER — Encounter: Payer: Self-pay | Admitting: Family

## 2022-11-08 VITALS — BP 156/65 | HR 77 | Temp 97.0°F | Ht 64.0 in | Wt 118.1 lb

## 2022-11-08 DIAGNOSIS — R339 Retention of urine, unspecified: Secondary | ICD-10-CM

## 2022-11-08 LAB — POCT URINALYSIS DIPSTICK
Bilirubin, UA: NEGATIVE
Blood, UA: POSITIVE
Glucose, UA: NEGATIVE
Ketones, UA: NEGATIVE
Leukocytes, UA: NEGATIVE
Nitrite, UA: NEGATIVE
Protein, UA: NEGATIVE
Spec Grav, UA: 1.01 (ref 1.010–1.025)
Urobilinogen, UA: 0.2 E.U./dL
pH, UA: 6 (ref 5.0–8.0)

## 2022-11-08 NOTE — Addendum Note (Signed)
Addended byJeanie Sewer on: 11/08/2022 08:35 PM   Modules accepted: Level of Service

## 2022-11-08 NOTE — Progress Notes (Signed)
Patient ID: Linda [name], female    DOB: 06-Mar-1924, 89 y.o.   MRN: [id number]  Chief Complaint  Patient presents with   Urinary Retention    Pt c/o not being ale to urinate. Pt states she has to walk around a little to be able to urinate.    *Patient is accompanied by their daughter today, who is helping to provide HPI/medical information.  HPI:      Urinary retention:  pt reports having problems urinating, states she feels her belly is full, hard, drinks tea w/meals and water through the day, but probably only 12oz per day, but no more. Denies any hematuria, foul odor, pelvic pain, back pain (has chronic pain) no vaginal sx. Reports urine has been yellow or light yellow in color.  Assessment & Plan:  1. Urinary retention - UA negative. Advised on tips to encourage urination, running water, applying heat to belly, etc. Handout provided. Advised on increasing water intake, try 1 cup of water every hour until mid-afternoon. Advised keeping track of urinary output for 1 week, white hat provided, and call back totals to the office. Sending referral to Urology.  - POCT Urinalysis Dipstick - Ambulatory referral to Urology   Subjective:    Outpatient Medications Prior to Visit  Medication Sig Dispense Refill   acetaminophen (TYLENOL) 500 MG tablet Take 1,000 mg by mouth every 6 (six) hours as needed for mild pain.     amLODipine (NORVASC) 2.5 MG tablet TAKE 1 TABLET DAILY 90 tablet 1   aspirin EC 81 MG EC tablet Take 1 tablet (81 mg total) by mouth daily. Swallow whole. 30 tablet 11   Cholecalciferol (VITAMIN D3) 25 MCG (1000 UT) CAPS Take 1,000 Units by mouth 2 (two) times daily.     famotidine (PEPCID) 20 MG tablet Take 1 tablet (20 mg total) by mouth 2 (two) times daily. 180 tablet 3   INFANTS SIMETHICONE 20 MG/0.3ML drops Take 40 mg by mouth 2 (two) times daily.     lidocaine (LIDODERM) 5 % Place 1 patch onto the skin daily.     mirtazapine (REMERON) 30 MG tablet Take 30 mg by mouth  at bedtime.     polyethylene glycol (MIRALAX / GLYCOLAX) 17 g packet Take 17 g by mouth daily.     rosuvastatin (CRESTOR) 20 MG tablet TAKE 1 TABLET DAILY 90 tablet 3   sotalol (BETAPACE) 80 MG tablet TAKE 1 TABLET EVERY 12 HOURS (Patient taking differently: Take 80 mg by mouth 2 (two) times daily.) 180 tablet 3   timolol (TIMOPTIC) 0.25 % ophthalmic solution Place 1 drop into both eyes daily.     traMADol (ULTRAM) 50 MG tablet Take 0.5-1 tablets (25-50 mg total) by mouth every 6 (six) hours as needed. (Patient taking differently: Take 25-50 mg by mouth every 6 (six) hours as needed for moderate pain.) 30 tablet 5   No facility-administered medications prior to visit.   Past Medical History:  Diagnosis Date   Arthritis    COPD (chronic obstructive pulmonary disease) (HCC)    chronic cough   Glaucoma    Hyperlipidemia    Hypertension    Irregular heartbeat    Osteoporosis    Thyroid disease    enlarged thyroid   Past Surgical History:  Procedure Laterality Date   APPENDECTOMY     cateract surgery     COLONOSCOPY     Done in [place]  yrs ago   UPPER GASTROINTESTINAL ENDOSCOPY  2019   WRIST SURGERY  No Known Allergies    Objective:    Physical Exam Vitals and nursing note reviewed.  Constitutional:      Appearance: Normal appearance.  Cardiovascular:     Rate and Rhythm: Normal rate and regular rhythm.  Pulmonary:     Effort: Pulmonary effort is normal.     Breath sounds: Normal breath sounds.  Musculoskeletal:        General: Normal range of motion.  Skin:    General: Skin is warm and dry.  Neurological:     Mental Status: She is alert.  Psychiatric:        Mood and Affect: Mood normal.        Behavior: Behavior normal.    BP (!) 156/65 (BP Location: Left Arm, Patient Position: Sitting, Cuff Size: Normal)   Pulse 77   Temp (!) 97 F (36.1 C) (Temporal)   Ht 5\' 4"  (1.626 m)   Wt 118 lb 2 oz (53.6 kg)   SpO2 97%   BMI 20.28 kg/m  Wt Readings from Last 3  Encounters:  11/08/22 118 lb 2 oz (53.6 kg)  10/31/22 115 lb 12.8 oz (52.5 kg)  08/31/22 115 lb 9.6 oz (52.4 kg)       Jeanie Sewer, NP

## 2022-11-08 NOTE — Patient Instructions (Addendum)
It was very nice to see you today!   You do not have a urinary tract infection. I am sending a referral to Urology. They will call you directly to schedule. Be sure you are urinating at least twice per day. Try to drink more water -  shoot for 1 cup (8 oz) every hour until around 3:00. Your urine should be light yellow or clear in color this tells you are hydrating enough.  We have given you a "hat" to place in your commode to collect your urine and you need to have a Medical assistant check this at least twice a day and write down the amount for one week and you can let us know these totals. Turn on the water faucet when trying to urinate and/or Use a warm compress or heating pad prior to trying to urinate. These can help stimulate your flow. See the handout attached for more information.     PLEASE NOTE:  If you had any lab tests please let us know if you have not heard back within a few days. You may see your results on MyChart before we have a chance to review them but we will give you a call once they are reviewed by [place]. If we ordered any referrals today, please let us know if you have not heard from their office within the next week.

## 2022-11-15 NOTE — Telephone Encounter (Signed)
[facility] form faxed to [phone]

## 2022-11-16 ENCOUNTER — Ambulatory Visit: Payer: Medicare Other | Admitting: Family Medicine

## 2022-11-17 ENCOUNTER — Ambulatory Visit (INDEPENDENT_AMBULATORY_CARE_PROVIDER_SITE_OTHER): Payer: Medicare Other | Admitting: Family Medicine

## 2022-11-17 ENCOUNTER — Encounter: Payer: Self-pay | Admitting: Family Medicine

## 2022-11-17 VITALS — BP 139/70 | HR 67 | Temp 97.8°F | Ht 64.0 in | Wt 116.4 lb

## 2022-11-17 DIAGNOSIS — K219 Gastro-esophageal reflux disease without esophagitis: Secondary | ICD-10-CM

## 2022-11-17 DIAGNOSIS — L309 Dermatitis, unspecified: Secondary | ICD-10-CM

## 2022-11-17 DIAGNOSIS — I10 Essential (primary) hypertension: Secondary | ICD-10-CM | POA: Diagnosis not present

## 2022-11-17 DIAGNOSIS — S32000G Wedge compression fracture of unspecified lumbar vertebra, subsequent encounter for fracture with delayed healing: Secondary | ICD-10-CM | POA: Diagnosis not present

## 2022-11-17 MED ORDER — TRAMADOL HCL 50 MG PO TABS: 50.0000 mg | ORAL_TABLET | Freq: Two times a day (BID) | ORAL | 5 refills | Status: DC

## 2022-11-17 MED ORDER — TRIAMCINOLONE ACETONIDE 0.1 % EX CREA: 1.0000 | TOPICAL_CREAM | Freq: Two times a day (BID) | CUTANEOUS | 0 refills | Status: DC

## 2022-11-17 NOTE — Progress Notes (Signed)
   Jalexia Hesser is a 89 y.o. female who presents today for an office visit.  Assessment/Plan:  New/Acute Problems: Cold Intolerance Recent labs are all normal.  Reassured patient that this is common in older patients and that is likely due to thinning skin and decreased metabolism.  Recommended she wear an extra layer as needed.   Chronic Problems Addressed Today: Lumbar compression fracture (HCC) Pain is still not fully controlled.  She will continue taking Tylenol 1000 milligram 3 times daily.  Due to difficulty with getting tramadol on time at her ALF we will start scheduled dosing 50 mg twice daily.  She is aware of potential side effects.  They will follow-up with me again in a few months  HTN (hypertension) Initially elevated however at goal on recheck on amlodipine 2.5 mg daily.  Dermatitis Start topical triamcinolone.  Gastroesophageal reflux disease without esophagitis On Pepcid 20 mg twice daily and Tums as needed.  No current abdominal pain.  Reassured patient about her normal bowel sounds.  She can use simethicone as needed.     Subjective:  HPI:  See A/p for status of chronic conditions.  She is here today with her daughter.  Primary concern today is ongoing back pain.  This has been a longstanding issue related to recent compression fractures.  Currently on Tylenol 1000 mg 3 times daily and tramadol 25 to 50 mg 4 times daily as needed.  She is currently in assisted living facility and she has had some difficulty with getting tramadol as needed and they would like to try an alternative pain medication fracture.  She is also had increasing cold intolerance and is concerned about potential vitamin deficiency.  She is also worried about the spot on the left side of her neck.  We saw this a few months ago and recommended cortisone cream however they have not yet tried this.  She is also concerned about gurgling sounds after eating.       Objective:  Physical Exam: BP  139/70   Pulse 67   Temp 97.8 F (36.6 C) (Temporal)   Ht 5\' 4"  (1.626 m)   Wt 116 lb 6.4 oz (52.8 kg)   SpO2 97%   BMI 19.98 kg/m   Gen: No acute distress, resting comfortably CV: Regular rate and rhythm with no murmurs appreciated Pulm: Normal work of breathing, clear to auscultation bilaterally with no crackles, wheezes, or rhonchi Neuro: Grossly normal, moves all extremities Psych: Normal affect and thought content      Jerremy Maione M. Jimmey Ralph, MD 11/17/2022 1:30 PM

## 2022-11-17 NOTE — Assessment & Plan Note (Signed)
Start topical triamcinolone. 

## 2022-11-17 NOTE — Assessment & Plan Note (Signed)
Pain is still not fully controlled.  She will continue taking Tylenol 1000 milligram 3 times daily.  Due to difficulty with getting tramadol on time at her ALF we will start scheduled dosing 50 mg twice daily.  She is aware of potential side effects.  They will follow-up with me again in a few months

## 2022-11-17 NOTE — Assessment & Plan Note (Signed)
Initially elevated however at goal on recheck on amlodipine 2.5 mg daily.

## 2022-11-17 NOTE — Patient Instructions (Signed)
It was very nice to see you today!  Will start tramadol 50 mg twice daily.  Please use the triamcinolone cream to the spot on your neck.  We will see you back in a few months.  Please come back to see [place] sooner if needed.  Take care, Dr Jimmey Ralph  PLEASE NOTE:  If you had any lab tests, please let us know if you have not heard back within a few days. You may see your results on mychart before we have a chance to review them but we will give you a call once they are reviewed by [place].   If we ordered any referrals today, please let us know if you have not heard from their office within the next week.   If you had any urgent prescriptions sent in today, please check with the pharmacy within an hour of our visit to make sure the prescription was transmitted appropriately.   Please try these tips to maintain a healthy lifestyle:  Eat at least 3 REAL meals and 1-2 snacks per day.  Aim for no more than 5 hours between eating.  If you eat breakfast, please do so within one hour of getting up.   Each meal should contain half fruits/vegetables, one quarter protein, and one quarter carbs (no bigger than a computer mouse)  Cut down on sweet beverages. This includes juice, soda, and sweet tea.   Drink at least 1 glass of water with each meal and aim for at least 8 glasses per day  Exercise at least 150 minutes every week.

## 2022-11-17 NOTE — Assessment & Plan Note (Signed)
On Pepcid 20 mg twice daily and Tums as needed.  No current abdominal pain.  Reassured patient about her normal bowel sounds.  She can use simethicone as needed.

## 2022-11-24 ENCOUNTER — Ambulatory Visit (INDEPENDENT_AMBULATORY_CARE_PROVIDER_SITE_OTHER): Payer: Medicare Other | Admitting: Family Medicine

## 2022-11-24 ENCOUNTER — Encounter: Payer: Self-pay | Admitting: Family Medicine

## 2022-11-24 VITALS — BP 130/68 | HR 73 | Temp 98.2°F | Ht 64.0 in | Wt 116.2 lb

## 2022-11-24 DIAGNOSIS — R339 Retention of urine, unspecified: Secondary | ICD-10-CM

## 2022-11-24 DIAGNOSIS — R5383 Other fatigue: Secondary | ICD-10-CM

## 2022-11-24 LAB — POCT URINALYSIS DIPSTICK
Bilirubin, UA: NEGATIVE
Blood, UA: 10
Glucose, UA: NEGATIVE
Ketones, UA: NEGATIVE
Leukocytes, UA: NEGATIVE
Nitrite, UA: NEGATIVE
Protein, UA: NEGATIVE
Spec Grav, UA: 1.015 (ref 1.010–1.025)
Urobilinogen, UA: 0.2 E.U./dL
pH, UA: 6 (ref 5.0–8.0)

## 2022-11-24 NOTE — Progress Notes (Signed)
Acute Office Visit   Subjective:  Patient ID: Linda [name], female    DOB: 04/01/1924, 89 y.o.   MRN: [id number]  Chief Complaint  Patient presents with   Fatigue    Pt reports she has not feeling good.(This has been going on forever) Pt reports she has a hard time with urination. Pt reports she has diarrhea sometimes .   HPI Patient is a 89 year old caucasian female that presents with not feeling, having a hard time with urination, and diarrhea sometimes. Patient reports she can not urinate at times. Sometimes it takes 4-5 hours it urinate. She was seen on 03/27, for urinary retention at [facility]. She didn't have a UTI and was referred to [facility].  Change in bowel habits. Sometimes she wakes up in the morning, has loose bowels. Also, reports she has gurgling sounds in her abd. Last bowel movement yesterday. She reports he usually goes every 2-3 days.   Denies abd bloating/pain, chills, sweats, nausea, or vomiting.   Patient is accompanied with daughter. Patient is a resident at [facility] in [place]. Patients daughter reports this is her cognitive baseline.  ROS See HPI above     Objective:   BP 130/68   Pulse 73   Temp 98.2 F (36.8 C)   Ht  (1.626 m)   Wt 116 lb 4 oz (52.7 kg)   SpO2 95%   BMI 19.95 kg/m    Physical Exam Vitals reviewed.  Constitutional:      General: She is not in acute distress.    Appearance: Normal appearance. She is not ill-appearing, toxic-appearing or diaphoretic.  Eyes:     General:        Right eye: No discharge.        Left eye: No discharge.     Conjunctiva/sclera: Conjunctivae normal.  Cardiovascular:     Rate and Rhythm: Normal rate and regular rhythm.     Heart sounds: Normal heart sounds. No murmur heard.    No friction rub. No gallop.  Pulmonary:     Effort: Pulmonary effort is normal. No respiratory distress.     Breath sounds: Normal breath sounds.  Abdominal:      General: Bowel sounds are normal. There is no distension.     Tenderness: There is abdominal tenderness (Mild tenderness lower mid abd).  Musculoskeletal:        General: Normal range of motion.  Skin:    General: Skin is warm and dry.  Neurological:     General: No focal deficit present.     Mental Status: She is alert and oriented to person, place, and time. Mental status is at baseline.     Gait: Gait abnormal ([place]).  Psychiatric:        Mood and Affect: Mood normal.        Behavior: Behavior normal. Behavior is cooperative.    Results for orders placed or performed in visit on 11/24/22  POCT urinalysis dipstick  Result Value Ref Range   Color, UA     Clarity, UA     Glucose, UA Negative Negative   Bilirubin, UA NEG    Ketones, UA NEG    Spec Grav, UA 1.015 1.010 - 1.025   Blood, UA 10    pH, UA 6.0 5.0 - 8.0   Protein, UA Negative Negative   Urobilinogen, UA 0.2 0.2 or 1.0 E.U./dL   Nitrite, UA NEG    Leukocytes,  UA Negative Negative   Appearance     Odor        Assessment & Plan:  Other fatigue -     POCT urinalysis dipstick -     CBC with Differential/Platelet -     Comprehensive metabolic panel  Urinary retention   -PCOT urinalysis is negative for UTI. However, there is blood present in the urine and was present on previous urinalysis in March at [facility]. Recommend patients daughter schedule an appointment with [facility]. Referral was placed on 11/07/2021. If she has any problems with scheduling the appointment, advised to please call back to the office.  -Try to drink more water -  shoot for 1 cup (8 oz) every hour until around 3:00. Your urine should be light yellow or clear in color this tells you are hydrating enough.  -Turn on the water faucet when trying to urinate and/or Use a warm compress or heating pad prior to trying to urinate. These can help stimulate your flow. -Ordered CBC and CMP with symptoms of fatigue.  -Advised if she  can not urinate twice in one day, pain changes, mental status changes, or symptoms become worse, please go directly to the closes emergent department.  -Patient and pts daughter expressed understanding and is in agreement with plan.  No follow-ups on file.  Zandra Abts, NP

## 2022-11-24 NOTE — Patient Instructions (Addendum)
It was a pleasure to meet you today. I hope you get to feeling better soon. -No urinary tract infection. However, there is blood present in the urine. Recommend patients daughter schedule an appointment with [facility]. Referral was placed on 11/07/2021. If you have problems with scheduling the appointment, please call back to the office.  -Try to drink more water -  shoot for 1 cup (8 oz) every hour until around 3:00. Your urine should be light yellow or clear in color this tells you are hydrating enough.  -Turn on the water faucet when trying to urinate and/or Use a warm compress or heating pad prior to trying to urinate. These can help stimulate your flow. -Ordered labs to check kidney function and see about infection. Office will call with results and you may see them on MyChart.  -If you can not urinate twice in one day, pain changes, mental status changes, or symptoms become worse, please go directly to the closes emergent department.

## 2022-11-25 LAB — COMPREHENSIVE METABOLIC PANEL
AG Ratio: 1.4 (calc) (ref 1.0–2.5)
ALT: 10 U/L (ref 6–29)
AST: 18 U/L (ref 10–35)
Albumin: 4.1 g/dL (ref 3.6–5.1)
Alkaline phosphatase (APISO): 112 U/L (ref 37–153)
BUN: 12 mg/dL (ref 7–25)
CO2: 26 mmol/L (ref 20–32)
Calcium: 10.8 mg/dL — ABNORMAL HIGH (ref 8.6–10.4)
Chloride: 100 mmol/L (ref 98–110)
Creat: 0.72 mg/dL (ref 0.60–0.95)
Globulin: 3 g/dL (calc) (ref 1.9–3.7)
Glucose, Bld: 104 mg/dL — ABNORMAL HIGH (ref 65–99)
Potassium: 5.1 mmol/L (ref 3.5–5.3)
Sodium: 137 mmol/L (ref 135–146)
Total Bilirubin: 0.3 mg/dL (ref 0.2–1.2)
Total Protein: 7.1 g/dL (ref 6.1–8.1)

## 2022-11-25 LAB — CBC WITH DIFFERENTIAL/PLATELET
Absolute Monocytes: 686 cells/uL (ref 200–950)
Basophils Absolute: 39 cells/uL (ref 0–200)
Basophils Relative: 0.5 %
Eosinophils Absolute: 140 cells/uL (ref 15–500)
Eosinophils Relative: 1.8 %
HCT: 39.4 % (ref 35.0–45.0)
Hemoglobin: 13 g/dL (ref 11.7–15.5)
Lymphs Abs: 1895 cells/uL (ref 850–3900)
MCH: 30.8 pg (ref 27.0–33.0)
MCHC: 33 g/dL (ref 32.0–36.0)
MCV: 93.4 fL (ref 80.0–100.0)
MPV: 9.8 fL (ref 7.5–12.5)
Monocytes Relative: 8.8 %
Neutro Abs: 5039 cells/uL (ref 1500–7800)
Neutrophils Relative %: 64.6 %
Platelets: 259 10*3/uL (ref 140–400)
RBC: 4.22 10*6/uL (ref 3.80–5.10)
RDW: 12.1 % (ref 11.0–15.0)
Total Lymphocyte: 24.3 %
WBC: 7.8 10*3/uL (ref 3.8–10.8)

## 2022-11-28 ENCOUNTER — Ambulatory Visit (INDEPENDENT_AMBULATORY_CARE_PROVIDER_SITE_OTHER): Payer: Medicare Other | Admitting: Cardiology

## 2022-11-28 VITALS — BP 130/60 | HR 68 | Ht 64.0 in | Wt 117.6 lb

## 2022-11-28 DIAGNOSIS — R011 Cardiac murmur, unspecified: Secondary | ICD-10-CM

## 2022-11-28 DIAGNOSIS — I10 Essential (primary) hypertension: Secondary | ICD-10-CM

## 2022-11-28 DIAGNOSIS — I4719 Other supraventricular tachycardia: Secondary | ICD-10-CM | POA: Diagnosis not present

## 2022-11-28 DIAGNOSIS — I361 Nonrheumatic tricuspid (valve) insufficiency: Secondary | ICD-10-CM

## 2022-11-28 DIAGNOSIS — Z8673 Personal history of transient ischemic attack (TIA), and cerebral infarction without residual deficits: Secondary | ICD-10-CM | POA: Diagnosis not present

## 2022-11-28 MED ORDER — ASPIRIN 81 MG PO TBEC: 81.0000 mg | DELAYED_RELEASE_TABLET | Freq: Every day | ORAL | 11 refills | Status: DC

## 2022-11-28 NOTE — Patient Instructions (Signed)
Medication Instructions:  Your physician recommends that you continue on your current medications as directed. Please refer to the Current Medication list given to you today.   *If you need a refill on your cardiac medications before your next appointment, please call your pharmacy*  Lab Work: NONE  Testing/Procedures: NONE   Follow-Up: At [facility], you and your health needs are our priority.  As part of our continuing mission to provide you with exceptional heart care, we have created designated Provider Care Teams.  These Care Teams include your primary Cardiologist (physician) and Advanced Practice Providers (APPs -  Physician Assistants and Nurse Practitioners) who all work together to provide you with the care you need, when you need it.  We recommend signing up for the patient portal called "MyChart".  Sign up information is provided on this After Visit Summary.  MyChart is used to connect with patients for Virtual Visits (Telemedicine).  Patients are able to view lab/test results, encounter notes, upcoming appointments, etc.  Non-urgent messages can be sent to your provider as well.   To learn more about what you can do with MyChart, go to [email or web address].    Your next appointment:   6 month(s)  The format for your next appointment:   In Person  Provider:   Bridgette Christopher, MD             

## 2022-11-28 NOTE — Progress Notes (Signed)
Cardiology Office Note:    Date:  11/28/2022   ID:  Linda [name], DOB 1923-09-11, MRN [id number]  PCP:  Ardith Dark, MD  Cardiologist:  Jodelle Red, MD  Referring MD: Ardith Dark, MD   CC: Follow-up   History of Present Illness:    Linda [name] is a 89 y.o. female with a hx of CVA, hypertension, hyperlipidemia, COPD, and arthritis, who is seen for follow-up. I initially met her 06/27/2021 as a new consult at the request of Ardith Dark, MD for the evaluation and management of paroxysmal atrial tachycardia.  History: She was previously under the care of Dr. Adaline Sill, [facility], [facility], with cardiology (placed on sotalol in 2019). Her prior PCP was Dr. Jacqulyn Bath, [facility], [place]. None of these records are available in Care Everywhere.  She was admitted to the hospital 09/19/2021 with multiple symptoms concerning for stroke. She was found to have a left parietal infarct of unknown etiology. She was discharged on Plavix.   At her appointment 11/14/2021 her Plavix had been discontinued one week prior. We reviewed her hospitalization and recommendations. We spent extensive time discussing cardiac monitors, and that if afib was found, the recommendation is for long term anticoagulation. She was struggling severe, constant back pain believed to be due to a compression fracture. At a visit with her GI doctor her BP was 100/60. Her readings were fluctuating at other clinic visits. Most of her BP measurements were charted as 150-160 systolic.  Last visit she had been suffering from chronic back pain. Also continued to have some residual memory loss from her prior stroke. She stays active walking through the hallways. She uses a walker for support. She has to walk down the halls to have her meals and feels okay when doing so.  Today, the patient presents with her daughter to help provide history. She states that her heart has been doing well.  Her blood pressure has been well controlled recently. Her back pain has been causing her difficulty. Her GI and urinary tract has been bothering her with loose stool and difficulty urinating.  She denies any palpitations, chest pain, shortness of breath, or peripheral edema. No lightheadedness, headaches, syncope, orthopnea, or PND.  Past Medical History:  Diagnosis Date   Arthritis    COPD (chronic obstructive pulmonary disease)    chronic cough   Glaucoma    Hyperlipidemia    Hypertension    Irregular heartbeat    Osteoporosis    Thyroid disease    enlarged thyroid    Past Surgical History:  Procedure Laterality Date   APPENDECTOMY     cateract surgery     COLONOSCOPY     Done in [place]  yrs ago   UPPER GASTROINTESTINAL ENDOSCOPY  2019   WRIST SURGERY      Current Medications: Current Outpatient Medications on File Prior to Visit  Medication Sig   acetaminophen (TYLENOL) 500 MG tablet Take 1,000 mg by mouth every 6 (six) hours as needed for mild pain.   amLODipine (NORVASC) 2.5 MG tablet TAKE 1 TABLET DAILY   aspirin EC 81 MG EC tablet Take 1 tablet (81 mg total) by mouth daily. Swallow whole. (Patient not taking: Reported on 11/24/2022)   Cholecalciferol (VITAMIN D3) 25 MCG (1000 UT) CAPS Take 1,000 Units by mouth 2 (two) times daily.   famotidine (PEPCID) 20 MG tablet Take 1 tablet (20 mg total) by mouth 2 (two) times daily.  INFANTS SIMETHICONE 20 MG/0.3ML drops Take 40 mg by mouth 2 (two) times daily.   ketoconazole (NIZORAL) 2 % shampoo APPLY TOPICALLY 2 TIMES A WEEK   lidocaine (LIDODERM) 5 % Place 1 patch onto the skin daily.   mirtazapine (REMERON) 30 MG tablet Take 30 mg by mouth at bedtime.   polyethylene glycol (MIRALAX / GLYCOLAX) 17 g packet Take 17 g by mouth daily.   rosuvastatin (CRESTOR) 20 MG tablet TAKE 1 TABLET DAILY   sotalol (BETAPACE) 80 MG tablet TAKE 1 TABLET EVERY 12 HOURS (Patient taking differently: Take 80 mg by mouth 2 (two) times daily.)    timolol (TIMOPTIC) 0.25 % ophthalmic solution Place 1 drop into both eyes daily.   traMADol (ULTRAM) 50 MG tablet Take 1 tablet (50 mg total) by mouth 2 (two) times daily. 10am and 10pm   triamcinolone cream (KENALOG) 0.1 % Apply 1 Application topically 2 (two) times daily. (Patient not taking: Reported on 11/24/2022)   No current facility-administered medications on file prior to visit.     Allergies:   Patient has no known allergies.   Social History   Tobacco Use   Smoking status: Former    Packs/day: 0.25    Years: 2.00    Additional pack years: 0.00    Total pack years: 0.50    Types: Cigarettes    Start date: 2    Quit date: 1952    Years since quitting: 72.3    Passive exposure: Past   Smokeless tobacco: Never  Vaping Use   Vaping Use: Never used  Substance Use Topics   Alcohol use: Not Currently   Drug use: Never    Family History: family history includes Heart disease in her brother, brother, father, and mother; Stroke in her mother. There is no history of Colon cancer, Colon polyps, or Esophageal cancer.  ROS:   Please see the history of present illness. (+) Back pain (+) Slight hematuria (+) Difficulty Urinating All other systems are reviewed and negative.    EKGs/Labs/Other Studies Reviewed:    The following studies were reviewed today:  CTA Head/Neck  09/22/2021: IMPRESSION: 1. Limited study. The patient is kyphotic and not able to lie flat. Patient not able to hold still. There is considerable artifact on the study with decreased spatial resolution. 2. Restricted diffusion left parietal lobe seen on MRI not visualized by CT. No acute hemorrhage. 3. Negative for intracranial large vessel occlusion or flow limiting stenosis 4. 25% diameter stenosis proximal right internal carotid artery. Left carotid widely patent 5. Both vertebral arteries widely patent. 6. Goiter with substernal extension on the left.  Echo 09/20/2021:  1. Left ventricular  ejection fraction, by estimation, is 70 to 75%. The  left ventricle has hyperdynamic function. The left ventricle has no  regional wall motion abnormalities. Left ventricular diastolic parameters  are consistent with Grade II diastolic  dysfunction (pseudonormalization). Elevated left atrial pressure.   2. Right ventricular systolic function is normal. The right ventricular  size is normal. There is moderately elevated pulmonary artery systolic  pressure. The estimated right ventricular systolic pressure is 58.4 mmHg.   3. The mitral valve is normal in structure. Trivial mitral valve  regurgitation.   4. Tricuspid valve regurgitation is mild to moderate.   5. The aortic valve is tricuspid. There is mild calcification of the  aortic valve. Aortic valve regurgitation is mild. Aortic valve sclerosis  is present, with no evidence of aortic valve stenosis.   6. The inferior vena  cava is normal in size with <50% respiratory  variability, suggesting right atrial pressure of 8 mmHg.   Comparison(s): Prior images reviewed side by side. The left ventricular  diastolic function is significantly worse. There is now evidence of  increased mean left atrial pressure, otherwise no significant change.  Carotid Arterial Duplex 10/04/2020: Summary:  Right Carotid: Velocities in the right ICA are consistent with a 1-39%  stenosis.   Left Carotid: Velocities in the left ICA are consistent with a 1-39%  stenosis.   Vertebrals: Bilateral vertebral arteries demonstrate antegrade flow.   Echo 09/30/2019:  1. Left ventricular ejection fraction, by estimation, is 60 to 65%. The  left ventricle has normal function. The left ventricle has no regional  wall motion abnormalities. Left ventricular diastolic parameters are  consistent with age-related delayed  relaxation (normal).   2. Right ventricular systolic function is normal. The right ventricular  size is normal. There is moderately elevated pulmonary  artery systolic  pressure. The estimated right ventricular systolic pressure is 54.8 mmHg.   3. Left atrial size was mildly dilated.   4. Right atrial size was mildly dilated.   5. The mitral valve is degenerative. Mild mitral valve regurgitation.   6. The tricuspid valve is myxomatous. Tricuspid valve regurgitation is  moderate to severe.   7. The aortic valve is tricuspid. Aortic valve regurgitation is mild.  Mild aortic valve sclerosis is present, with no evidence of aortic valve  stenosis.   8. The inferior vena cava is normal in size with <50% respiratory  variability, suggesting right atrial pressure of 8 mmHg.   EKG:  EKG is personally reviewed.   11/28/2022: sinus rhythm with 1st degree AV block at 68 bpm, LVH 03/30/2022:  EKG was not ordered. 11/14/2021: EKG was not ordered. 06/27/2021: SR, 1st degree AV block, LVH at 64 bpm  Recent Labs: 08/12/2022: Magnesium 1.8 11/24/2022: ALT 10; BUN 12; Creat 0.72; Hemoglobin 13.0; Platelets 259; Potassium 5.1; Sodium 137   Recent Lipid Panel    Component Value Date/Time   CHOL 116 09/20/2021 0338   TRIG 99 09/20/2021 0338   HDL 45 09/20/2021 0338   CHOLHDL 2.6 09/20/2021 0338   VLDL 20 09/20/2021 0338   LDLCALC 51 09/20/2021 0338   LDLCALC 77 06/23/2020 1027    Physical Exam:    VS:  BP 130/60   Pulse 68   Ht 5\' 4"  (1.626 m)   Wt 117 lb 9.6 oz (53.3 kg)   BMI 20.19 kg/m     Wt Readings from Last 3 Encounters:  11/24/22 116 lb 4 oz (52.7 kg)  11/17/22 116 lb 6.4 oz (52.8 kg)  11/08/22 118 lb 2 oz (53.6 kg)    GEN: Well nourished, well developed in no acute distress HEENT: Normal, moist mucous membranes NECK: No JVD CARDIAC: regular rhythm, normal S1 and S2, no rubs or gallops. 2/6 systolic murmur at sternal border VASCULAR: Radial and DP pulses 2+ bilaterally. No carotid bruits RESPIRATORY:  Clear to auscultation without rales, wheezing or rhonchi ABDOMEN: Soft, non-tender, non-distended MUSCULOSKELETAL:  Ambulates  independently SKIN: Warm and dry, very trivial bilateral LE edema without pitting NEUROLOGIC:  Alert and oriented x 3. No focal neuro deficits noted. PSYCHIATRIC:  Normal affect    ASSESSMENT:    1. History of CVA (cerebrovascular accident)   2. Essential hypertension   3. Paroxysmal atrial tachycardia   4. Murmur, cardiac   5. Nonrheumatic tricuspid valve regurgitation     PLAN:    Prior  CVA, concern for embolic source Paroxysmal atrial tachycardia: -see prior discussion. Elect not to pursue monitor/loop recorder -tolerating rosuvastatin -she is on aspirin. Has slight hematuria but not severe. If there are future bleeding issues, would stop given age -on sotalol for PAT, tolerating. No recent symptoms  Murmur Tricuspid regurgitation Elevated pulmonary pressures -appears euvolemic on exam -soft murmur -discussed potential etiologies of elevated PA pressures. She would like to manage conservatively, avoid invasive testing -no diuretics needed today, may need PRN in the future -she is being weighed 3x/week at her facility. Weights have been stable.  Hypertension -well controlled  Cardiac risk counseling and prevention recommendations: -she has been on statin long term, tolerating. 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  Jodelle Red, MD, PhD, [facility]  [facility]    Medication Adjustments/Labs and Tests Ordered: Current medicines are reviewed at length with the patient today.  Concerns regarding medicines are outlined above.   Orders Placed This Encounter  Procedures   EKG 12-Lead   Meds ordered this encounter  Medications   DISCONTD: aspirin EC 81 MG tablet    Sig: Take 1 tablet (81 mg total) by mouth daily. Swallow whole.    Dispense:   30 tablet    Refill:  11   aspirin EC 81 MG tablet    Sig: Take 1 tablet (81 mg total) by mouth daily. Swallow whole.    Dispense:  30 tablet    Refill:  11   Patient Instructions  Medication Instructions:  Your physician recommends that you continue on your current medications as directed. Please refer to the Current Medication list given to you today.  *If you need a refill on your cardiac medications before your next appointment, please call your pharmacy*  Lab Work: NONE  Testing/Procedures: NONE  Follow-Up: At [facility], you and your health needs are our priority.  As part of our continuing mission to provide you with exceptional heart care, we have created designated Provider Care Teams.  These Care Teams include your primary Cardiologist (physician) and Advanced Practice Providers (APPs -  Physician Assistants and Nurse Practitioners) who all work together to provide you with the care you need, when you need it.  We recommend signing up for the patient portal called "MyChart".  Sign up information is provided on this After Visit Summary.  MyChart is used to connect with patients for Virtual Visits (Telemedicine).  Patients are able to view lab/test results, encounter notes, upcoming appointments, etc.  Non-urgent messages can be sent to your provider as well.   To learn more about what you can do with MyChart, go to [email or web address].    Your next appointment:   6 month(s)  The format for your next appointment:   In Person  Provider:   Jodelle Red, MD       I,Coren O'Brien,acting as a scribe for Jodelle Red, MD.,have documented all relevant documentation on the behalf of Jodelle Red, MD,as directed by  Jodelle Red, MD while in the presence of Jodelle Red, MD.  I, Jodelle Red, MD, have reviewed all documentation for this visit. The documentation on 01/16/23 for the exam, diagnosis, procedures,  and orders are all accurate and complete.

## 2022-11-30 ENCOUNTER — Telehealth: Payer: Self-pay | Admitting: Family Medicine

## 2022-11-30 NOTE — Telephone Encounter (Signed)
Placed in PCP sign folder.

## 2022-11-30 NOTE — Telephone Encounter (Signed)
Patient dropped off document FL2, to be filled out by provider. Patient requested to send it via Fax [phone] within ASAP. Document is located in providers tray at front office.

## 2022-12-01 DIAGNOSIS — Z0279 Encounter for issue of other medical certificate: Secondary | ICD-10-CM

## 2022-12-01 NOTE — Telephone Encounter (Signed)
Done.  Amani Marseille M. Malisa Ruggiero, MD 12/01/2022 12:45 PM   

## 2022-12-13 ENCOUNTER — Other Ambulatory Visit: Payer: Self-pay | Admitting: Family Medicine

## 2022-12-19 ENCOUNTER — Ambulatory Visit: Payer: Medicare Other | Admitting: Family Medicine

## 2022-12-22 ENCOUNTER — Other Ambulatory Visit: Payer: Self-pay | Admitting: Family Medicine

## 2022-12-25 ENCOUNTER — Telehealth: Payer: Self-pay | Admitting: Family Medicine

## 2022-12-25 MED ORDER — TRIAMCINOLONE ACETONIDE 0.1 % EX CREA: 1.0000 | TOPICAL_CREAM | Freq: Two times a day (BID) | CUTANEOUS | 0 refills | Status: DC

## 2022-12-25 NOTE — Telephone Encounter (Signed)
[facility] faxed document plan of care , to be filled out by provider. Patient requested to send it via Fax [phone]   Document is located in providers tray at front office.

## 2022-12-26 ENCOUNTER — Ambulatory Visit: Payer: Medicare Other | Admitting: Family Medicine

## 2023-01-01 NOTE — Telephone Encounter (Signed)
Form was faxed on 12/29/2022 Fax# [phone] Placed to be scan in pt chart

## 2023-01-03 ENCOUNTER — Other Ambulatory Visit: Payer: Self-pay | Admitting: Physician Assistant

## 2023-01-05 ENCOUNTER — Telehealth: Payer: Self-pay | Admitting: *Deleted

## 2023-01-05 ENCOUNTER — Encounter: Payer: Self-pay | Admitting: Family Medicine

## 2023-01-05 ENCOUNTER — Ambulatory Visit (INDEPENDENT_AMBULATORY_CARE_PROVIDER_SITE_OTHER): Payer: Medicare Other | Admitting: Family Medicine

## 2023-01-05 VITALS — BP 169/74 | HR 69 | Temp 97.3°F | Ht 64.0 in | Wt 116.4 lb

## 2023-01-05 DIAGNOSIS — S32000G Wedge compression fracture of unspecified lumbar vertebra, subsequent encounter for fracture with delayed healing: Secondary | ICD-10-CM | POA: Diagnosis not present

## 2023-01-05 DIAGNOSIS — R3911 Hesitancy of micturition: Secondary | ICD-10-CM

## 2023-01-05 DIAGNOSIS — L309 Dermatitis, unspecified: Secondary | ICD-10-CM

## 2023-01-05 DIAGNOSIS — I10 Essential (primary) hypertension: Secondary | ICD-10-CM

## 2023-01-05 MED ORDER — ACETAMINOPHEN 500 MG PO TABS: 1000.0000 mg | ORAL_TABLET | Freq: Three times a day (TID) | ORAL | 0 refills | Status: DC

## 2023-01-05 MED ORDER — ACETAMINOPHEN 500 MG PO TABS: 1000.0000 mg | ORAL_TABLET | Freq: Every day | ORAL | 0 refills | Status: DC | PRN

## 2023-01-05 MED ORDER — TRAMADOL HCL 50 MG PO TABS: ORAL_TABLET | ORAL | 5 refills | Status: DC

## 2023-01-05 MED ORDER — ACETAMINOPHEN 500 MG PO TABS: 1000.0000 mg | ORAL_TABLET | Freq: Four times a day (QID) | ORAL | 0 refills | Status: DC

## 2023-01-05 NOTE — Progress Notes (Signed)
Linda [name] is a 89 y.o. female who presents today for an office visit.  Assessment/Plan:  Chronic Problems Addressed Today: Lumbar compression fracture (HCC) Pain is better than last time and tramadol does seem to be working however seems to be wearing off and they would like to change her medication administration schedule.  This would be reasonable.  We will send a new order form over to her assisted living facility with a new schedule of tramadol 50 mg at 8 AM and 8 PM.  Also she can take Tylenol 1000 mg 3 times daily at 10 AM, 4 PM, and 10 PM.  She can take an extra 1000 mg daily as needed but should not exceed 4000 mg total daily.  They will follow-up with me in a few weeks.  We could increase dose of tramadol to 50 mg 3 times daily if needed.  Dermatitis Has had only modest improvement with the topical triamcinolone.  We did discuss going back to clobetasol however they would like to finish out course of triamcinolone first.  Urinary hesitancy Has seen urology for this.  Sounds like she is may have some pelvic floor dysfunction.  The did offer a pelvic exam however she declined.  She may benefit from pelvic floor rehab.  Will place referral today.  Her infectious workup has been negative.  HTN (hypertension) Elevated today though typically well-controlled.  Continue amlodipine 2.5 mg daily.  They will let us know if persistently elevated.     Subjective:  HPI:  See A/P for status of chronic conditions.  Patient is here today for follow-up.  We last saw her about a month ago.  At that point we discussed her pain secondary to lumbar compression fracture.  We had continued her on Tylenol 1000 mg 3 times daily and also started scheduled tramadol 50 mg twice daily.  This is working recently well though they do feel like the medication is sometimes wearing all and they are interested in switching to a different schedule.  Daughter thinks would be beneficial to change tramadol schedule  to 8 AM and 8 PM and Tylenol scheduled at 10 AM, 4 PM, and 10 PM.  She has had some ongoing issues with constipation.  This is overall manageable but she has been working on try to get more fluids and staying well-hydrated.  She has also been taking MiraLAX.  She is also been having some issues with urinary hesitancy.  She did see urology.  They did offer a pelvic exam.  Declined further workup.  She has had urinalysis and urine culture testing done recently which was negative for infection.      Objective:  Physical Exam: BP (!) 169/74   Pulse 69   Temp (!) 97.3 F (36.3 C) (Temporal)   Ht 5\' 4"  (1.626 m)   Wt 116 lb 6.4 oz (52.8 kg)   SpO2 97%   BMI 19.98 kg/m   Gen: No acute distress, resting comfortably CV: Regular rate and rhythm with no murmurs appreciated Pulm: Normal work of breathing, clear to auscultation bilaterally with no crackles, wheezes, or rhonchi Neuro: Grossly normal, moves all extremities Psych: Normal affect and thought content  Time Spent: 40 minutes of total time was spent on the date of the encounter performing the following actions: chart review prior to seeing the patient including recent specialist visit, obtaining history, performing a medically necessary exam, counseling on the treatment plan, placing orders, and documenting in our EHR.  Katina Degree. Jimmey Ralph, MD 01/05/2023 3:20 PM

## 2023-01-05 NOTE — Assessment & Plan Note (Signed)
Elevated today though typically well-controlled.  Continue amlodipine 2.5 mg daily.  They will let us know if persistently elevated.

## 2023-01-05 NOTE — Telephone Encounter (Signed)
[facility] [place] form faxed to [phone] Form placed to be scan in patient chart

## 2023-01-05 NOTE — Assessment & Plan Note (Signed)
Has had only modest improvement with the topical triamcinolone.  We did discuss going back to clobetasol however they would like to finish out course of triamcinolone first.

## 2023-01-05 NOTE — Assessment & Plan Note (Signed)
Has seen urology for this.  Sounds like she is may have some pelvic floor dysfunction.  The did offer a pelvic exam however she declined.  She may benefit from pelvic floor rehab.  Will place referral today.  Her infectious workup has been negative.

## 2023-01-05 NOTE — Patient Instructions (Addendum)
It was very nice to see you today!  We will change to schedule to your Tylenol and tramadol.  I will also refer you to see the pelvic floor specialist.  Please let me know in a few weeks how you are doing.  Return if symptoms worsen or fail to improve.   Take care, Dr Jimmey Ralph  PLEASE NOTE:  If you had any lab tests, please let us know if you have not heard back within a few days. You may see your results on mychart before we have a chance to review them but we will give you a call once they are reviewed by [place].   If we ordered any referrals today, please let us know if you have not heard from their office within the next week.   If you had any urgent prescriptions sent in today, please check with the pharmacy within an hour of our visit to make sure the prescription was transmitted appropriately.   Please try these tips to maintain a healthy lifestyle:  Eat at least 3 REAL meals and 1-2 snacks per day.  Aim for no more than 5 hours between eating.  If you eat breakfast, please do so within one hour of getting up.   Each meal should contain half fruits/vegetables, one quarter protein, and one quarter carbs (no bigger than a computer mouse)  Cut down on sweet beverages. This includes juice, soda, and sweet tea.   Drink at least 1 glass of water with each meal and aim for at least 8 glasses per day  Exercise at least 150 minutes every week.

## 2023-01-05 NOTE — Assessment & Plan Note (Signed)
Pain is better than last time and tramadol does seem to be working however seems to be wearing off and they would like to change her medication administration schedule.  This would be reasonable.  We will send a new order form over to her assisted living facility with a new schedule of tramadol 50 mg at 8 AM and 8 PM.  Also she can take Tylenol 1000 mg 3 times daily at 10 AM, 4 PM, and 10 PM.  She can take an extra 1000 mg daily as needed but should not exceed 4000 mg total daily.  They will follow-up with me in a few weeks.  We could increase dose of tramadol to 50 mg 3 times daily if needed.

## 2023-01-11 ENCOUNTER — Emergency Department (HOSPITAL_BASED_OUTPATIENT_CLINIC_OR_DEPARTMENT_OTHER)
Admission: EM | Admit: 2023-01-11 | Discharge: 2023-01-11 | Disposition: A | Discharge status: Home / Self Care | Payer: Medicare Other | Attending: Emergency Medicine | Admitting: Emergency Medicine

## 2023-01-11 ENCOUNTER — Ambulatory Visit: Payer: Medicare Other | Admitting: Family Medicine

## 2023-01-11 ENCOUNTER — Emergency Department (HOSPITAL_BASED_OUTPATIENT_CLINIC_OR_DEPARTMENT_OTHER): Payer: Medicare Other

## 2023-01-11 ENCOUNTER — Other Ambulatory Visit: Payer: Self-pay

## 2023-01-11 DIAGNOSIS — Z7982 Long term (current) use of aspirin: Secondary | ICD-10-CM | POA: Diagnosis not present

## 2023-01-11 DIAGNOSIS — Z79899 Other long term (current) drug therapy: Secondary | ICD-10-CM | POA: Diagnosis not present

## 2023-01-11 DIAGNOSIS — I10 Essential (primary) hypertension: Secondary | ICD-10-CM | POA: Diagnosis not present

## 2023-01-11 DIAGNOSIS — R930 Abnormal findings on diagnostic imaging of skull and head, not elsewhere classified: Secondary | ICD-10-CM | POA: Diagnosis not present

## 2023-01-11 DIAGNOSIS — R5381 Other malaise: Secondary | ICD-10-CM

## 2023-01-11 DIAGNOSIS — J209 Acute bronchitis, unspecified: Secondary | ICD-10-CM | POA: Insufficient documentation

## 2023-01-11 DIAGNOSIS — R5383 Other fatigue: Secondary | ICD-10-CM | POA: Diagnosis present

## 2023-01-11 DIAGNOSIS — G459 Transient cerebral ischemic attack, unspecified: Secondary | ICD-10-CM

## 2023-01-11 DIAGNOSIS — Z8673 Personal history of transient ischemic attack (TIA), and cerebral infarction without residual deficits: Secondary | ICD-10-CM | POA: Insufficient documentation

## 2023-01-11 LAB — BASIC METABOLIC PANEL
Anion gap: 7 (ref 5–15)
BUN: 7 mg/dL — ABNORMAL LOW (ref 8–23)
CO2: 28 mmol/L (ref 22–32)
Calcium: 10.7 mg/dL — ABNORMAL HIGH (ref 8.9–10.3)
Chloride: 101 mmol/L (ref 98–111)
Creatinine, Ser: 0.58 mg/dL (ref 0.44–1.00)
GFR, Estimated: >60 mL/min (ref >60)
Glucose, Bld: 102 mg/dL — ABNORMAL HIGH (ref 70–99)
Potassium: 4 mmol/L (ref 3.5–5.1)
Sodium: 136 mmol/L (ref 135–145)

## 2023-01-11 LAB — URINALYSIS, ROUTINE W REFLEX MICROSCOPIC
Bilirubin Urine: NEGATIVE
Glucose, UA: NEGATIVE mg/dL
Hgb urine dipstick: NEGATIVE
Ketones, ur: NEGATIVE mg/dL
Leukocytes,Ua: NEGATIVE
Nitrite: NEGATIVE
Protein, ur: NEGATIVE mg/dL
Specific Gravity, Urine: <1.005 — ABNORMAL LOW (ref 1.005–1.030)
pH: 7 (ref 5.0–8.0)

## 2023-01-11 LAB — CBC WITH DIFFERENTIAL/PLATELET
Abs Immature Granulocytes: 0.01 10*3/uL (ref 0.00–0.07)
Basophils Absolute: 0 10*3/uL (ref 0.0–0.1)
Basophils Relative: 0 %
Eosinophils Absolute: 0.2 10*3/uL (ref 0.0–0.5)
Eosinophils Relative: 2 %
HCT: 40.5 % (ref 36.0–46.0)
Hemoglobin: 13.2 g/dL (ref 12.0–15.0)
Immature Granulocytes: 0 %
Lymphocytes Relative: 17 %
Lymphs Abs: 1.5 10*3/uL (ref 0.7–4.0)
MCH: 31.1 pg (ref 26.0–34.0)
MCHC: 32.6 g/dL (ref 30.0–36.0)
MCV: 95.3 fL (ref 80.0–100.0)
Monocytes Absolute: 0.5 10*3/uL (ref 0.1–1.0)
Monocytes Relative: 6 %
Neutro Abs: 6.4 10*3/uL (ref 1.7–7.7)
Neutrophils Relative %: 75 %
Platelets: 221 10*3/uL (ref 150–400)
RBC: 4.25 MIL/uL (ref 3.87–5.11)
RDW: 12.7 % (ref 11.5–15.5)
WBC: 8.5 10*3/uL (ref 4.0–10.5)
nRBC: 0 % (ref 0.0–0.2)

## 2023-01-11 LAB — TSH: TSH: 0.87 u[IU]/mL (ref 0.350–4.500)

## 2023-01-11 LAB — MAGNESIUM: Magnesium: 2 mg/dL (ref 1.7–2.4)

## 2023-01-11 MED ORDER — AZITHROMYCIN 250 MG PO TABS: ORAL_TABLET | ORAL | 0 refills | Status: DC

## 2023-01-11 NOTE — ED Notes (Signed)
Patient Family had concerns regarding Final BP. MD informed.

## 2023-01-11 NOTE — ED Provider Notes (Addendum)
[place] EMERGENCY DEPARTMENT AT [facility] Provider Note   CSN: [id number] Arrival date & time: 01/11/23  4098     History  Chief Complaint  Patient presents with   Fatigue    Linda [name] is a 89 y.o. female.  Patient presents with generally feeling unwell worsening over the past 2 months specific the past 2 weeks.  Patient's blood pressure is elevated at [facility] facility this morning.  For patient's age she is doing well, uses a walker at baseline.  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  Patient denies any unilateral symptoms or signs that are new.  No headache dizziness significant vision loss or numbness tingling.  Patient has had gradually worsening cough history of bronchiectasis.  No fever.  No chest pain or shortness of breath.       Home Medications Prior to Admission medications   Medication Sig Start Date End Date Taking? Authorizing Provider  amLODipine (NORVASC) 2.5 MG tablet TAKE 1 TABLET DAILY 11/02/22  Yes Jodelle Red, MD  aspirin EC 81 MG tablet Take 1 tablet (81 mg total) by mouth daily. Swallow whole. 11/28/22  Yes Jodelle Red, MD  azithromycin (ZITHROMAX Z-PAK) 250 MG tablet Take 2 capsules today then 1 capsule days 2 through 5. 01/11/23  Yes Blane Ohara, MD  Cholecalciferol (VITAMIN D3) 25 MCG (1000 UT) CAPS Take 1,000 Units by mouth 2 (two) times daily.   Yes [provider]  DHA-EPA-Flaxseed Oil-Vitamin E (THERA TEARS NUTRITION PO) Place 1 drop into both eyes in the morning, at noon, in the evening, and at bedtime. *wait 5 minutes between timolol eye drops*   Yes [provider]  famotidine (PEPCID) 20 MG tablet Take 1 tablet (20 mg total) by mouth 2 (two) times daily. Patient needs follow up appointment for future refills. Please call [phone] to schedule an appointment. 01/03/23  Yes Esterwood, Amy S, PA-C  INFANTS SIMETHICONE 20 MG/0.3ML drops Take 40 mg by mouth 2 (two)  times daily. 10/21/21  Yes [provider]  lidocaine (LIDODERM) 5 % Place 1 patch onto the skin daily. 02/21/22  Yes [provider]  mirtazapine (REMERON) 30 MG tablet Take 30 mg by mouth at bedtime. 03/24/22  Yes [provider]  polyethylene glycol (MIRALAX / GLYCOLAX) 17 g packet Take 17 g by mouth daily.   Yes [provider]  rosuvastatin (CRESTOR) 20 MG tablet TAKE 1 TABLET DAILY 03/13/22  Yes Ardith Dark, MD  sotalol (BETAPACE) 80 MG tablet TAKE 1 TABLET EVERY 12 HOURS Patient taking differently: Take 80 mg by mouth 2 (two) times daily. 06/13/22  Yes Ardith Dark, MD  timolol (TIMOPTIC) 0.25 % ophthalmic solution Place 1 drop into both eyes daily. 02/22/19  Yes [provider]  traMADol (ULTRAM) 50 MG tablet TAKE (1) TABLET BY MOUTH TWICE DAILY. **8AM & 8PM** 01/05/23  Yes Ardith Dark, MD  triamcinolone cream (KENALOG) 0.1 % Apply 1 Application topically 2 (two) times daily. Patient taking differently: Apply 1 Application topically 2 (two) times daily. Apply topically to scaly patch on left side of neck twice daily 12/25/22  Yes Ardith Dark, MD  acetaminophen (TYLENOL) 500 MG tablet Take 2 tablets (1,000 mg total) by mouth daily as needed. Do not exceed 4000mg  daily Patient taking differently: Take 1,000 mg by mouth every evening. Do not exceed 4000mg  daily, as needed for pain 01/05/23   Ardith Dark, MD      Allergies  Patient has no known allergies.    Review of Systems   Review of Systems  Constitutional:  Positive for fatigue. Negative for chills and fever.  HENT:  Positive for congestion.   Eyes:  Negative for visual disturbance.  Respiratory:  Positive for cough. Negative for shortness of breath.   Cardiovascular:  Negative for chest pain.  Gastrointestinal:  Negative for abdominal pain and vomiting.  Genitourinary:  Negative for dysuria and flank pain.  Musculoskeletal:  Negative for back pain, neck pain and neck  stiffness.  Skin:  Negative for rash.  Neurological:  Negative for light-headedness and headaches.    Physical Exam Updated Vital Signs BP (!) 153/114 (BP Location: Right Arm)   Pulse 71   Temp 97.7 F (36.5 C) (Oral)   Resp 16   Ht 5\' 4"  (1.626 m)   Wt 52.8 kg   SpO2 96%   BMI 19.98 kg/m  Physical Exam Vitals and nursing note reviewed.  Constitutional:      General: She is not in acute distress.    Appearance: She is well-developed.  HENT:     Head: Normocephalic and atraumatic.     Nose: Congestion present.     Mouth/Throat:     Mouth: Mucous membranes are moist.  Eyes:     General:        Right eye: No discharge.        Left eye: No discharge.     Conjunctiva/sclera: Conjunctivae normal.  Neck:     Trachea: No tracheal deviation.  Cardiovascular:     Rate and Rhythm: Normal rate and regular rhythm.     Heart sounds: No murmur heard. Pulmonary:     Effort: Pulmonary effort is normal.     Breath sounds: Normal breath sounds.  Abdominal:     General: There is no distension.     Palpations: Abdomen is soft.     Tenderness: There is no abdominal tenderness. There is no guarding.  Musculoskeletal:        General: No swelling or tenderness.     Cervical back: Normal range of motion and neck supple. No rigidity.  Skin:    General: Skin is warm.     Capillary Refill: Capillary refill takes less than 2 seconds.     Findings: No rash.  Neurological:     General: No focal deficit present.     Mental Status: She is alert.     Cranial Nerves: No cranial nerve deficit.     Comments: Patient can lift all extremities against gravity however left lower extremity take significantly effort to do this compared to right.  Sensation intact palpation all extremities.  Finger-nose intact but more difficult with the left arm.  No facial droop no arm drift.  Psychiatric:        Mood and Affect: Mood normal.     ED Results / Procedures / Treatments   Labs (all labs ordered are  listed, but only abnormal results are displayed) Labs Reviewed  BASIC METABOLIC PANEL - Abnormal; Notable for the following components:      Result Value   Glucose, Bld 102 (*)    BUN 7 (*)    Calcium 10.7 (*)    All other components within normal limits  URINALYSIS, ROUTINE W REFLEX MICROSCOPIC - Abnormal; Notable for the following components:   Color, Urine COLORLESS (*)    Specific Gravity, Urine <1.005 (*)    All other components within normal limits  CBC WITH DIFFERENTIAL/PLATELET  MAGNESIUM  TSH    EKG None  Radiology CT Head Wo Contrast  Result Date: 01/11/2023 CLINICAL DATA:  Left leg weakness, feeling unwell. EXAM: CT HEAD WITHOUT CONTRAST TECHNIQUE: Contiguous axial images were obtained from the base of the skull through the vertex without intravenous contrast. RADIATION DOSE REDUCTION: This exam was performed according to the departmental dose-optimization program which includes automated exposure control, adjustment of the mA and/or kV according to patient size and/or use of iterative reconstruction technique. COMPARISON:  Brain MRI 09/21/2021, CTA head/neck 09/22/2021 FINDINGS: Brain: There is no acute intracranial hemorrhage, extra-axial fluid collection, or acute infarct Parenchymal volume is within expected limits for age. The ventricles are normal in size. Gray-white differentiation is preserved. Prominent CSF space overlying the left cerebellar hemisphere is unchanged. The pituitary and suprasellar region are normal. There is no mass lesion. There is no mass effect or midline shift. Vascular: There is calcification of the bilateral carotid siphons and vertebral arteries. Skull: Normal. Negative for fracture or focal lesion. Sinuses/Orbits: There is chronic left sphenoid sinusitis with complete opacification. The paranasal sinuses are otherwise clear. Bilateral lens implants are in place. The globes and orbits are otherwise unremarkable. Other: None. IMPRESSION: No acute  intracranial pathology. Electronically Signed   By: Lesia Hausen M.D.   On: 01/11/2023 10:58   DG Chest Portable 1 View  Result Date: 01/11/2023 CLINICAL DATA:  89 year old female with cough and fatigue. EXAM: PORTABLE CHEST 1 VIEW COMPARISON:  Portable chest 09/20/2021 and earlier. FINDINGS: Portable AP semi upright view at 1008 hours. Larger lung volumes, appearing hyperinflated now. Mediastinal contours are stable, within normal limits. Increased asymmetric thoracic inlet density appears related to chronic thyroid goiter demonstrated by CT in 2021, with mediastinal extension greater on the left. Occasional scattered lung reticulonodular opacity appears to be chronic, was present on previous CT and suspicious for sequelae of atypical lung infection. No superimposed pneumothorax, pulmonary edema, pleural effusion or acute pulmonary opacity. No acute osseous abnormality identified. Paucity of bowel gas in the upper abdomen. IMPRESSION: 1. Pulmonary hyperinflation with evidence of chronic atypical lung infection. No acute cardiopulmonary abnormality identified. 2. Chronic thyroid goiter with mediastinal extension. Electronically Signed   By: Odessa Fleming M.D.   On: 01/11/2023 10:23    Procedures Procedures    Medications Ordered in ED Medications - No data to display  ED Course/ Medical Decision Making/ A&P                             Medical Decision Making Amount and/or Complexity of Data Reviewed Labs: ordered. Radiology: ordered.  Risk Prescription drug management.   Elderly patient presents with generally not feeling well and on exam left leg weakness unsure how long she has had this that she uses the walker and cannot recall.  Patient's blood pressure also uncontrolled 220 systolic and down to 199 systolic without treatment.  Daughter in the room very helpful.  Discussed broad differential including metabolic, anemia, hypertensive hemorrhage, occult stroke, thyroid related, blood pressure  related, other.  Daughter and patient comfortable with blood work, CT scan of the head, chest x-ray look for any signs of pneumonia.  With bronchiectasis history and worsening productive cough plan for atypical antibiotics.  On reassessment patient overall well-appearing, no unilateral weakness or numbness.  CT scan results independent reviewed and shared with patient no acute findings.  Discussed would need MRI to look for occult stroke.  Urinalysis reviewed no signs of infection.  Blood work reassuring normal electrolytes, hemoglobin white blood cell count unremarkable.  Discussed whether MRI would change disposition or plan, patient, daughter and myself agree to hold at this time but to consider when following up with neurology.  Reasons to return such as stroke signs or symptoms stressed with patient and daughter who are comfortable this plan.  Plan for atypical antibiotic coverage for productive cough and outpatient follow-up.  Blood pressure will need rechecked by primary doctor improved in the ER.  Patient/daughter more uncomfortable with significant elevated blood pressure prior to discharge.  Attempted to discuss with primary doctor however office said he left for the day.  We decided to order MRI of the brain to look for stroke as a cause of her elevated blood pressure and TIA-like symptoms.  Patient care signed out to Dr. Adela Lank to follow-up results.      Final Clinical Impression(s) / ED Diagnoses Final diagnoses:  Primary hypertension  Malaise and fatigue  TIA (transient ischemic attack)  Acute bronchitis, unspecified organism    Rx / DC Orders ED Discharge Orders          Ordered    azithromycin (ZITHROMAX Z-PAK) 250 MG tablet        01/11/23 1335              Blane Ohara, MD 01/11/23 1336    Blane Ohara, MD 01/11/23 1517

## 2023-01-11 NOTE — ED Triage Notes (Signed)
Pt arrived via [facility] from [facility]. Per EMS, pt c/o "feeling bad." Pt c/o general malaise x2 mo which she states she saw her PCP for Friday. HTN this morning, when daughter arrived to the ED she states HTN is the primary reason for coming to ED. Pt denies headache, N/V, dizziness, blurred vision, numbness/tingling in extremities. Pt has cough, however states is chronic.

## 2023-01-11 NOTE — ED Notes (Signed)
RN reviewed discharge instructions with pt. Pt verbalized understanding and had no further questions. VSS upon discharge.  

## 2023-01-11 NOTE — ED Notes (Signed)
Pt aware of the need for a urine... Unable to currently collect.... 

## 2023-01-11 NOTE — ED Notes (Signed)
Pt took PO HTN meds at SNF around 0830 this morning per EMS.

## 2023-01-11 NOTE — ED Provider Notes (Signed)
Received patient in turnover from Dr. Jodi Mourning.  Please see their note for further details of Hx, PE.  Briefly patient is a 89 y.o. female with a Fatigue .  89 year old female feeling a bit fatigued especially with standing up and having some trouble with her memory.  Started this morning.  Plan to obtain an MRI which likely we have availability here today.  If negative likely discharge home.  MRI is negative.  I discussed results with the patient and family.  Encouraged him to follow-up with her family doctor and neurology.  The patient was still concerned that her symptoms were yet unexplained.  The family is also still concerned that her blood pressure is elevated from her baseline.  I did offer discussed the case with the hospitalist.  They would prefer to go home at this time.Adela Lank, Jesusita Oka, DO 01/11/23 1645

## 2023-01-11 NOTE — Discharge Instructions (Addendum)
Take antibiotics for lung infection. Continue to take your medications as prescribed and follow-up closely with your primary doctor or neurology. Return to [facility] for strokelike symptoms.

## 2023-01-16 ENCOUNTER — Ambulatory Visit: Payer: Medicare Other | Admitting: Family Medicine

## 2023-01-16 ENCOUNTER — Encounter (HOSPITAL_BASED_OUTPATIENT_CLINIC_OR_DEPARTMENT_OTHER): Payer: Self-pay | Admitting: Cardiology

## 2023-01-16 ENCOUNTER — Ambulatory Visit (INDEPENDENT_AMBULATORY_CARE_PROVIDER_SITE_OTHER): Payer: Medicare Other | Admitting: Family Medicine

## 2023-01-16 ENCOUNTER — Encounter: Payer: Self-pay | Admitting: Family Medicine

## 2023-01-16 VITALS — BP 159/65 | HR 71 | Temp 97.5°F | Ht 64.0 in | Wt 115.0 lb

## 2023-01-16 DIAGNOSIS — I10 Essential (primary) hypertension: Secondary | ICD-10-CM

## 2023-01-16 MED ORDER — AMLODIPINE BESYLATE 5 MG PO TABS: 5.0000 mg | ORAL_TABLET | Freq: Every day | ORAL | 3 refills | Status: DC

## 2023-01-16 NOTE — Progress Notes (Signed)
   Linda [name] is a 89 y.o. female who presents today for an office visit.  Assessment/Plan:  Chronic Problems Addressed Today: HTN (hypertension) Had lengthy discussion with patient and her daughter today regarding her blood pressure control.  She is slightly above goal today and did have extreme elevation last week in the ED.  Would be reasonable to increase her dose of amlodipine to 5 mg daily.  We also discussed conservative measures that will help including salt avoidance - patient does admit to adding salt to quite a bit of her food.  They will continue to monitor at home and follow-up with [place] in a couple weeks and we can titrate the meds as needed.  We discussed reasons to return to care and seek emergent care.     Subjective:  HPI:  See Assessment / plan for status of chronic conditions.   Patient here today for ED follow-up.  She is here today with her daughter.  She went to the ED 5 days ago with fatigue and elevated blood pressure at her nursing facility.  There was initial concern for possible strokelike symptoms and had work up workup there including labs and CT scan which was negative.  Blood pressure was persistently in extreme elevated in the 200s and 220s.  She did ultimately get an MRI which was negative for any acute findings.  Her weakness did resolve and she was discharged home without any adjustment made to her blood pressure meds.  They have been monitoring blood pressure at her nursing facility for the last few days however patient does not know what numbers that they are getting.  She does state that she will sometimes occasionally feel "not good" and that she thinks it is due to her blood pressure being elevated.  No obvious exacerbating or precipitating events.  Sometimes feels like her vision is blurred during those episodes as well.  This happens very infrequently.  No new symptoms since her hospital visit.  No weakness or numbness.  No vision changes.        Objective:  Physical Exam: BP (!) 159/65   Pulse 71   Temp (!) 97.5 F (36.4 C) (Temporal)   Ht 5\' 4"  (1.626 m)   Wt 115 lb (52.2 kg)   SpO2 96%   BMI 19.74 kg/m   Gen: No acute distress, resting comfortably CV: Regular rate and rhythm with no murmurs appreciated Pulm: Normal work of breathing, clear to auscultation bilaterally with no crackles, wheezes, or rhonchi Neuro: Grossly normal, moves all extremities Psych: Normal affect and thought content  Time Spent: 40 minutes of total time was spent on the date of the encounter performing the following actions: chart review prior to seeing the patient including recent Emergency Department visit, obtaining history, performing a medically necessary exam, counseling on the treatment plan, placing orders, and documenting in our EHR.        Linda [name]. Linda Ralph, MD 01/16/2023 3:11 PM

## 2023-01-16 NOTE — Assessment & Plan Note (Addendum)
Had lengthy discussion with patient and her daughter today regarding her blood pressure control.  She is slightly above goal today and did have extreme elevation last week in the ED.  Would be reasonable to increase her dose of amlodipine to 5 mg daily.  We also discussed conservative measures that will help including salt avoidance - patient does admit to adding salt to quite a bit of her food.  They will continue to monitor at home and follow-up with [place] in a couple weeks and we can titrate the meds as needed.  We discussed reasons to return to care and seek emergent care.

## 2023-01-16 NOTE — Patient Instructions (Signed)
It was very nice to see you today!  We we will increase your amlodipine to 5 mg daily.  Check your blood pressure every morning and keep a log of this.  We will see you back in a couple weeks.  No other changes today.  Return in about 2 weeks (around 01/30/2023).   Take care, Dr Jimmey Ralph  PLEASE NOTE:  If you had any lab tests, please let us know if you have not heard back within a few days. You may see your results on mychart before we have a chance to review them but we will give you a call once they are reviewed by [place].   If we ordered any referrals today, please let us know if you have not heard from their office within the next week.   If you had any urgent prescriptions sent in today, please check with the pharmacy within an hour of our visit to make sure the prescription was transmitted appropriately.   Please try these tips to maintain a healthy lifestyle:  Eat at least 3 REAL meals and 1-2 snacks per day.  Aim for no more than 5 hours between eating.  If you eat breakfast, please do so within one hour of getting up.   Each meal should contain half fruits/vegetables, one quarter protein, and one quarter carbs (no bigger than a computer mouse)  Cut down on sweet beverages. This includes juice, soda, and sweet tea.   Drink at least 1 glass of water with each meal and aim for at least 8 glasses per day  Exercise at least 150 minutes every week.

## 2023-01-30 ENCOUNTER — Ambulatory Visit: Payer: Medicare Other | Admitting: Family Medicine

## 2023-02-01 ENCOUNTER — Encounter: Payer: Self-pay | Admitting: Family Medicine

## 2023-02-01 ENCOUNTER — Ambulatory Visit (INDEPENDENT_AMBULATORY_CARE_PROVIDER_SITE_OTHER): Payer: Medicare Other | Admitting: Family Medicine

## 2023-02-01 VITALS — BP 138/60 | HR 68 | Temp 97.8°F | Ht 64.0 in | Wt 116.4 lb

## 2023-02-01 DIAGNOSIS — I10 Essential (primary) hypertension: Secondary | ICD-10-CM | POA: Diagnosis not present

## 2023-02-01 DIAGNOSIS — S32000G Wedge compression fracture of unspecified lumbar vertebra, subsequent encounter for fracture with delayed healing: Secondary | ICD-10-CM

## 2023-02-01 NOTE — Patient Instructions (Addendum)
It was very nice to see you today!  Your blood pressure is on average to look better than before.  I would like to hold off on starting any additional medications at this point to prevent any complications.  Please continue your current medication regimen.  Return in about 3 months (around 05/04/2023) for Follow Up.   Take care, Dr Jimmey Ralph  PLEASE NOTE:  If you had any lab tests, please let us know if you have not heard back within a few days. You may see your results on mychart before we have a chance to review them but we will give you a call once they are reviewed by [place].   If we ordered any referrals today, please let us know if you have not heard from their office within the next week.   If you had any urgent prescriptions sent in today, please check with the pharmacy within an hour of our visit to make sure the prescription was transmitted appropriately.   Please try these tips to maintain a healthy lifestyle:  Eat at least 3 REAL meals and 1-2 snacks per day.  Aim for no more than 5 hours between eating.  If you eat breakfast, please do so within one hour of getting up.   Each meal should contain half fruits/vegetables, one quarter protein, and one quarter carbs (no bigger than a computer mouse)  Cut down on sweet beverages. This includes juice, soda, and sweet tea.   Drink at least 1 glass of water with each meal and aim for at least 8 glasses per day  Exercise at least 150 minutes every week.

## 2023-02-01 NOTE — Progress Notes (Signed)
   Linda [name] is a 89 y.o. female who presents today for an office visit.  Assessment/Plan:  Chronic Problems Addressed Today: HTN (hypertension) Better controlled today and at goal per JNC 8.  She does have an occasional reading elevated into the 180s and 190s however this is asymptomatic.  She does have diastolic on the lower side today at 55 and has had some readings at her facility into the 40s.  She does not currently having any issues with dizziness or lightheadedness however this does make it difficult to increase antihypertensive therapy at this point.  Will continue amlodipine 5 mg daily.  They will continue to monitor and let us know if she has any persistent elevations or symptomatic lows.  Lumbar compression fracture (HCC) Still has quite a bit of pain with this.  We have been trying to adjust her treatment schedule and she is now on tramadol 50 mg twice daily and Tylenol 1000 mg 3 times daily.  We did discuss potentially increasing dose of tramadol however they would like to hold off on this for now.  They will call to schedule an appointment with her neurosurgeon soon.  Follow-up in 3 months.    Subjective:  HPI:  See A/P for status of chronic conditions.  Patient is here today for follow-up.  Saw her 2 weeks ago for ED follow-up for blood pressure.  At her last visit we increased her amlodipine to 5 mg daily.  Also discussed conservative measures including salt avoidance.  She has done reasonably well the last couple of weeks.  She is still had labile blood pressure readings at her facility pressures have been ranging from the 130s over 80s to 190s over 90s.  Averaging usually in the 140s to 150s over 80s.  Her diastolic readings have been as low as the 40s and 50s.       Objective:  Physical Exam: BP (!) 145/55   Pulse 68   Temp 97.8 F (36.6 C) (Temporal)   Ht 5\' 4"  (1.626 m)   Wt 116 lb 6.4 oz (52.8 kg)   SpO2 97%   BMI 19.98 kg/m   Wt Readings from Last 3  Encounters:  02/01/23 116 lb 6.4 oz (52.8 kg)  01/16/23 115 lb (52.2 kg)  01/11/23 116 lb 6.5 oz (52.8 kg)  Gen: No acute distress, resting comfortably CV: Regular rate and rhythm with no murmurs appreciated Pulm: Normal work of breathing, clear to auscultation bilaterally with no crackles, wheezes, or rhonchi Neuro: Grossly normal, moves all extremities Psych: Normal affect and thought content      Milagro Belmares M. Jimmey Ralph, MD 02/01/2023 1:53 PM

## 2023-02-01 NOTE — Assessment & Plan Note (Signed)
Better controlled today and at goal per JNC 8.  She does have an occasional reading elevated into the 180s and 190s however this is asymptomatic.  She does have diastolic on the lower side today at 55 and has had some readings at her facility into the 40s.  She does not currently having any issues with dizziness or lightheadedness however this does make it difficult to increase antihypertensive therapy at this point.  Will continue amlodipine 5 mg daily.  They will continue to monitor and let us know if she has any persistent elevations or symptomatic lows.

## 2023-02-01 NOTE — Assessment & Plan Note (Signed)
Still has quite a bit of pain with this.  We have been trying to adjust her treatment schedule and she is now on tramadol 50 mg twice daily and Tylenol 1000 mg 3 times daily.  We did discuss potentially increasing dose of tramadol however they would like to hold off on this for now.  They will call to schedule an appointment with her neurosurgeon soon.

## 2023-02-06 ENCOUNTER — Encounter: Payer: Self-pay | Admitting: Internal Medicine

## 2023-02-06 ENCOUNTER — Ambulatory Visit (INDEPENDENT_AMBULATORY_CARE_PROVIDER_SITE_OTHER): Payer: Medicare Other | Admitting: Internal Medicine

## 2023-02-06 VITALS — BP 130/68 | HR 68 | Temp 98.3°F | Ht 64.0 in | Wt 115.8 lb

## 2023-02-06 DIAGNOSIS — K219 Gastro-esophageal reflux disease without esophagitis: Secondary | ICD-10-CM

## 2023-02-06 DIAGNOSIS — A310 Pulmonary mycobacterial infection: Secondary | ICD-10-CM

## 2023-02-06 NOTE — Patient Instructions (Signed)
Please schedule follow up scheduled with myself in 1 year.  If my schedule is not open yet, we will contact you with a reminder closer to that time. Please call [phone] if you haven't heard from [place] a month before.   Please call me if any problems with your breathing.

## 2023-02-06 NOTE — Progress Notes (Signed)
Linda [name]    [id number]    05-04-1924  Primary Care Physician:Parker, Katina Degree, MD Date of Appointment: 02/06/2023 Established Patient Visit  Chief complaint:   Chief Complaint  Patient presents with   Follow-up    Chronic cough     HPI: Linda [name] is a 89 y.o. woman with history of bronchiectasis and MAI which is being monitored conservatively. Had a stroke in January 2023 and in assisted living since then.   Interval Updates: Here for follow up.  Still having back pain from spinal arthritis, compression fractures. Has mild chronic cough occasional yellow sputum production  I have reviewed the patient's family social and past medical history and updated as appropriate.   Past Medical History:  Diagnosis Date   Arthritis    COPD (chronic obstructive pulmonary disease) (HCC)    chronic cough   Glaucoma    Hyperlipidemia    Hypertension    Irregular heartbeat    Osteoporosis    Thyroid disease    enlarged thyroid    Past Surgical History:  Procedure Laterality Date   APPENDECTOMY     cateract surgery     COLONOSCOPY     Done in [place]  yrs ago   UPPER GASTROINTESTINAL ENDOSCOPY  2019   WRIST SURGERY      Family History  Problem Relation Age of Onset   Stroke Mother    Heart disease Mother    Heart disease Father    Heart disease Brother    Heart disease Brother    Colon cancer Neg Hx    Colon polyps Neg Hx    Esophageal cancer Neg Hx     Social History   Occupational History   Occupation: Retired  Tobacco Use   Smoking status: Former    Packs/day: 0.25    Years: 2.00    Additional pack years: 0.00    Total pack years: 0.50    Types: Cigarettes    Start date: 1950    Quit date: 1952    Years since quitting: 72.5    Passive exposure: Past   Smokeless tobacco: Never  Vaping Use   Vaping Use: Never used  Substance and Sexual Activity   Alcohol use: Not Currently   Drug use: Never   Sexual activity: Not Currently     Physical  Exam: Blood pressure 130/68, pulse 68, temperature 98.3 F (36.8 C), temperature source Oral, height 5\' 4"  (1.626 m), weight 115 lb 12.8 oz (52.5 kg), SpO2 97 %.  Gen:      Thin, frail, well preserved for age Lungs:    kyphosis, breathing non-labored, no wheeze CV:         RRR no mrg, no edema   Data Reviewed: Imaging: I have personally reviewed the CT Chest October 2021 which shows bronchiectasis with nodule densities consistent with MAI infection, as well as patulous esophagus. Chest xrayMay 2024 redemonstrates hyperinflation, nodular opacities consistnet with MAI infection and bronchiectasis.   PFTs:      Latest Ref Rng & Units 07/14/2019   10:54 AM  PFT Results  FVC-Pre L 1.83   FVC-Predicted Pre % 91   FVC-Post L 1.75   FVC-Predicted Post % 87   Pre FEV1/FVC % % 70   Post FEV1/FCV % % 78   FEV1-Pre L 1.29   FEV1-Predicted Pre % 88   FEV1-Post L 1.36   DLCO uncorrected ml/min/mmHg 12.92   TLC L 4.36   TLC %  Predicted % 86   RV % Predicted % 99    I have personally reviewed the patient's PFTs and there is mild airflow limitation without BD response. Lung volumes normal, diffusion capacity is moderately reduced.   Labs: Lab Results  Component Value Date   NA 136 01/11/2023   K 4.0 01/11/2023   CO2 28 01/11/2023   GLUCOSE 102 (H) 01/11/2023   BUN 7 (L) 01/11/2023   CREATININE 0.58 01/11/2023   CALCIUM 10.7 (H) 01/11/2023   GFR 67.63 09/15/2021   GFRNONAA >60 01/11/2023   Lab Results  Component Value Date   WBC 8.5 01/11/2023   HGB 13.2 01/11/2023   HCT 40.5 01/11/2023   MCV 95.3 01/11/2023   PLT 221 01/11/2023    Immunization status: Immunization History  Administered Date(s) Administered   Fluad Quad(high Dose 65+) 05/18/2022   Influenza, High Dose Seasonal PF 05/15/2019   Influenza-Unspecified 06/04/2018, 05/07/2020, 05/27/2021   PFIZER(Purple Top)SARS-COV-2 Vaccination 08/28/2019, 09/17/2019, 06/02/2020, 02/03/2021   PNEUMOCOCCAL CONJUGATE-20  10/19/2020   Pfizer Covid-19 Vaccine Bivalent Booster 42yrs & up 05/07/2022   RSV,unspecified 06/18/2022   Zoster Recombinat (Shingrix) 04/02/2021, 09/11/2021    Assessment:  Chronic cough likely related to: Bronchiectasis without exacerbation 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 Does have chronic cough She will let us know if symptoms change or progress. Discussed trial of nebulizer therapy if she develops dyspnea, or escalation to PPI if nocturnal cough worsens continue pepcid for now.   Discussed pros and cons in detail with the patient and her daughter.   I spent 25 minutes in the care of this patient today including pre-charting, chart review, review of results, face-to-face care, coordination of care and communication with consultants etc.).  Return to Care: Return in about 1 year (around 02/06/2024).   Durel Salts, MD Pulmonary and Critical Care Medicine [facility] Office:[phone]

## 2023-02-07 ENCOUNTER — Telehealth: Payer: Self-pay | Admitting: Family Medicine

## 2023-02-07 NOTE — Telephone Encounter (Signed)
.  Please look into the following billing question below:   Has patient reached out to billing?  Yes  If yes, what did billing advise the patient?  Advised to call PCP office since able to be addressed by [place].  Is the patient calling in regard to a [employer] or [employer]?  No   Patient Name: Linda [name] MRN: [id number] [place] ID:  [id number] DOB: 01-13-1924 Date of Service:  12/01/22 Amount:  $29.00  Additional Comments:  I informed caller that on 4/18 it was recorded that an FL2 form was dropped off for completion. Also informed caller that the price point seems accurate due to services completed. Caller states she did not bring in any form for completion and that patient herself isn't able to do so.

## 2023-02-21 ENCOUNTER — Other Ambulatory Visit: Payer: Self-pay | Admitting: Family Medicine

## 2023-02-21 DIAGNOSIS — Z8673 Personal history of transient ischemic attack (TIA), and cerebral infarction without residual deficits: Secondary | ICD-10-CM

## 2023-02-21 NOTE — Telephone Encounter (Signed)
Caller is patient's daughter called back after speaking with billing. States billing informed her to call PCP office back since they couldn't file a claim for this to medicare. Upon looking at telephone note from 4/18 I was able to explain to caller that we received a fax for [facility] form to be completed from assisted living facility. Informed her that per my knowledge, even if facility requested this, patient would incur the charge. Caller verbalized understanding and thanked me.

## 2023-02-22 NOTE — Telephone Encounter (Signed)
Patient's daughter called back stating that she confirmed with assisted living facility that they did indeed request FL2 form in April. States she will pay the bill but she wanted to know why a charge was incurred. States she was informed by the facility that they usually don't have patients charged for this type of form being filled out. Caller would just like reasoning for charge since this appears to not be a usual thing.

## 2023-02-23 ENCOUNTER — Other Ambulatory Visit: Payer: Self-pay | Admitting: Family Medicine

## 2023-02-23 NOTE — Telephone Encounter (Signed)
Last proscribed by historical provider

## 2023-02-26 NOTE — Telephone Encounter (Signed)
I have reversed this one charge and notified daughter.

## 2023-02-27 ENCOUNTER — Other Ambulatory Visit (INDEPENDENT_AMBULATORY_CARE_PROVIDER_SITE_OTHER): Payer: Medicare Other

## 2023-02-27 ENCOUNTER — Encounter: Payer: Self-pay | Admitting: Orthopaedic Surgery

## 2023-02-27 ENCOUNTER — Other Ambulatory Visit: Payer: Self-pay | Admitting: Family Medicine

## 2023-02-27 ENCOUNTER — Ambulatory Visit: Payer: Medicare Other | Admitting: Orthopaedic Surgery

## 2023-02-27 VITALS — BP 143/61 | Ht 64.0 in | Wt 115.0 lb

## 2023-02-27 DIAGNOSIS — M549 Dorsalgia, unspecified: Secondary | ICD-10-CM

## 2023-02-27 DIAGNOSIS — M545 Low back pain, unspecified: Secondary | ICD-10-CM | POA: Diagnosis not present

## 2023-02-27 DIAGNOSIS — G8929 Other chronic pain: Secondary | ICD-10-CM

## 2023-02-27 NOTE — Progress Notes (Signed)
Office Visit Note   Patient: Linda [name]           Date of Birth: 1924-01-12           MRN: [id number] Visit Date: 02/27/2023              Requested by: Ardith Dark, MD [address] [place],  [place] [address] PCP: Ardith Dark, MD   Assessment & Plan: Visit Diagnoses:  1. Chronic midline low back pain without sciatica   2. Mid back pain     Plan: Patient gets complete relief when she sits down.  We discussed trying heating pad.  She has physical therapy in the location she is present and she will let us know if she would like physical therapy currently she declines.  We discussed options including vertebral plasty but she does not have further compression compared to last year images.  She is using Tylenol and tramadol 1 p.o. twice daily with some relief.  Follow-up as needed.  Follow-Up Instructions: Return if symptoms worsen or fail to improve.   Orders:  Orders Placed This Encounter  Procedures   XR Lumbar Spine 2-3 Views   XR Thoracic Spine 2 View   No orders of the defined types were placed in this encounter.     Procedures: No procedures performed   Clinical Data: No additional findings.   Subjective: Chief Complaint  Patient presents with   Middle Back - Pain   Lower Back - Pain    HPI 89 year old female with osteoporotic compression fracture returns.  Previous MRI April 2023 and new radiographs obtained today show new no new fractures.  She has significant kyphosis without compression fractures in the thoracic spine.  Hyperlordosis of the cervical spine.  Lumbar compression fractures L2 and L3.  Patient's had chronic midline thoracic back pain and upper lumbar pain.  COPD past history of smoking, osteoporosis, hypertension and hyperlipidemia.  Patient is here with her daughter.  Review of Systems updated unchanged   Objective: Vital Signs: BP (!) 143/61   Ht 5\' 4"  (1.626 m)   Wt 115 lb (52.2 kg)   BMI 19.74 kg/m   Physical Exam HENT:      Head: Atraumatic.     Right Ear: Tympanic membrane normal.     Left Ear: Tympanic membrane normal.  Eyes:     Extraocular Movements: Extraocular movements intact.  Cardiovascular:     Pulses: Normal pulses.     Comments: Thoracic kyphosis Pulmonary:     Comments: Slight prolonged expiratory phase. Neurological:     Mental Status: She is alert.     Ortho Exam prominent upper lumbar with scoliosis.  Tenderness lower thoracic region.  She is amatory with a rolling walker.  Specialty Comments:  No specialty comments available.  Imaging: XR Lumbar Spine 2-3 Views  Result Date: 02/27/2023 AP lateral lumbar images are obtained and reviewed this shows again stable L2,L3 compression fractures with lumbar curvature.  Unchanged from 2023 images. Impression: L2 and L3 compression fractures, stable.  XR Thoracic Spine 2 View  Result Date: 02/27/2023 Thoracic images demonstrate thoracic kyphosis without compression fractures. Impression: Thoracic kyphosis    PMFS History: Patient Active Problem List   Diagnosis Date Noted   Urinary hesitancy 01/05/2023   Insomnia 05/18/2022   Lumbar compression fracture (HCC) 01/25/2022   COPD (chronic obstructive pulmonary disease) (HCC) 09/21/2021   Osteoarthritis 08/12/2021   Dermatitis 04/13/2021   Multinodular goiter 11/19/2020   Hypercalcemia 11/19/2020   Hyperparathyroidism (  HCC) 11/19/2020   Bilateral impacted cerumen 11/08/2020   Bronchiectasis without acute exacerbation (HCC) 07/14/2019   Chronic cough 02/03/2019   Thoracic back pain 10/13/2018   Hyperlipidemia 10/13/2018   Vitamin D deficiency 10/13/2018   Gastroesophageal reflux disease without esophagitis 10/13/2018   Chronic midline thoracic back pain 10/13/2018   Osteoporosis without current pathological fracture 10/13/2018   HTN (hypertension) 10/13/2018   Stenosis of carotid artery 10/13/2018   History of thyroid nodule, s/p bx, benign 10/13/2018   Aortic cusp regurgitation  10/13/2018   Coccyalgia 10/13/2018   PAT (paroxysmal atrial tachycardia) 10/13/2018   Past Medical History:  Diagnosis Date   Arthritis    COPD (chronic obstructive pulmonary disease) (HCC)    chronic cough   Glaucoma    Hyperlipidemia    Hypertension    Irregular heartbeat    Osteoporosis    Thyroid disease    enlarged thyroid    Family History  Problem Relation Age of Onset   Stroke Mother    Heart disease Mother    Heart disease Father    Heart disease Brother    Heart disease Brother    Colon cancer Neg Hx    Colon polyps Neg Hx    Esophageal cancer Neg Hx     Past Surgical History:  Procedure Laterality Date   APPENDECTOMY     cateract surgery     COLONOSCOPY     Done in [place] ago   UPPER GASTROINTESTINAL ENDOSCOPY  2019   WRIST SURGERY     Social History   Occupational History   Occupation: Retired  Tobacco Use   Smoking status: Former    Current packs/day: 0.00    Average packs/day: 0.3 packs/day for 2.0 years (0.5 ttl pk-yrs)    Types: Cigarettes    Start date: 101    Quit date: 1952    Years since quitting: 72.5    Passive exposure: Past   Smokeless tobacco: Never  Vaping Use   Vaping status: Never Used  Substance and Sexual Activity   Alcohol use: Not Currently   Drug use: Never   Sexual activity: Not Currently

## 2023-03-12 ENCOUNTER — Other Ambulatory Visit (HOSPITAL_BASED_OUTPATIENT_CLINIC_OR_DEPARTMENT_OTHER): Payer: Self-pay | Admitting: Family Medicine

## 2023-03-12 NOTE — Telephone Encounter (Signed)
Last refill by historical provider  Last OV 02/01/2023

## 2023-03-19 ENCOUNTER — Other Ambulatory Visit: Payer: Self-pay | Admitting: Family Medicine

## 2023-03-29 ENCOUNTER — Encounter (INDEPENDENT_AMBULATORY_CARE_PROVIDER_SITE_OTHER): Payer: Self-pay

## 2023-04-05 ENCOUNTER — Other Ambulatory Visit: Payer: Self-pay | Admitting: Family Medicine

## 2023-04-23 ENCOUNTER — Other Ambulatory Visit: Payer: Self-pay | Admitting: Family Medicine

## 2023-04-25 ENCOUNTER — Other Ambulatory Visit: Payer: Self-pay | Admitting: Family Medicine

## 2023-04-30 ENCOUNTER — Other Ambulatory Visit: Payer: Self-pay | Admitting: *Deleted

## 2023-04-30 ENCOUNTER — Telehealth: Payer: Self-pay | Admitting: Family Medicine

## 2023-04-30 DIAGNOSIS — Z8673 Personal history of transient ischemic attack (TIA), and cerebral infarction without residual deficits: Secondary | ICD-10-CM

## 2023-04-30 NOTE — Telephone Encounter (Signed)
Prescription Request  04/30/2023  LOV: 02/01/2023  What is the name of the medication or equipment? aspirin EC 81 MG tablet   Have you contacted your pharmacy to request a refill? Yes   Which pharmacy would you like this sent to?   [facility] - [place], [place] - [address] [place] [address] Phone: [phone] Fax: [phone]    Patient notified that their request is being sent to the clinical staff for review and that they should receive a response within 2 business days.   Please advise at Mobile [phone] (mobile)

## 2023-05-02 ENCOUNTER — Other Ambulatory Visit: Payer: Self-pay | Admitting: *Deleted

## 2023-05-02 DIAGNOSIS — Z8673 Personal history of transient ischemic attack (TIA), and cerebral infarction without residual deficits: Secondary | ICD-10-CM

## 2023-05-02 MED ORDER — ASPIRIN 81 MG PO TBEC
81.0000 mg | DELAYED_RELEASE_TABLET | Freq: Once | ORAL | 0 refills | Status: DC
Start: 2023-05-02 — End: 2023-05-29

## 2023-05-02 NOTE — Telephone Encounter (Signed)
Rx send to pharmacy  

## 2023-05-08 ENCOUNTER — Ambulatory Visit (INDEPENDENT_AMBULATORY_CARE_PROVIDER_SITE_OTHER): Payer: Medicare Other | Admitting: Family Medicine

## 2023-05-08 ENCOUNTER — Encounter: Payer: Self-pay | Admitting: Family Medicine

## 2023-05-08 VITALS — BP 134/60 | HR 75 | Temp 97.3°F | Wt 117.2 lb

## 2023-05-08 DIAGNOSIS — I10 Essential (primary) hypertension: Secondary | ICD-10-CM

## 2023-05-08 DIAGNOSIS — L309 Dermatitis, unspecified: Secondary | ICD-10-CM

## 2023-05-08 DIAGNOSIS — S32000G Wedge compression fracture of unspecified lumbar vertebra, subsequent encounter for fracture with delayed healing: Secondary | ICD-10-CM

## 2023-05-08 DIAGNOSIS — Z23 Encounter for immunization: Secondary | ICD-10-CM

## 2023-05-08 MED ORDER — CLOBETASOL PROPIONATE 0.05 % EX OINT: 1.0000 | TOPICAL_OINTMENT | Freq: Two times a day (BID) | CUTANEOUS | 0 refills | Status: DC

## 2023-05-08 MED ORDER — AMLODIPINE BESYLATE 2.5 MG PO TABS: 2.5000 mg | ORAL_TABLET | Freq: Two times a day (BID) | ORAL | 3 refills | Status: DC

## 2023-05-08 NOTE — Assessment & Plan Note (Signed)
Only modest improvement with topical triamcinolone.  We did discuss referral to dermatology versus biopsy however they declined.  Will go back clobetasol as this worked better for her previously.  They will follow-up with [place] in a few weeks to let us know how they are doing.  Still no improvement will need to biopsy or refer to Derm at that point.

## 2023-05-08 NOTE — Progress Notes (Signed)
   Linda [name] is a 89 y.o. female who presents today for an office visit.  Assessment/Plan:  Chronic Problems Addressed Today: HTN (hypertension) At goal today however she is still having labile readings in the 180s and 190s at her facility.  These are asymptomatic readings.  Would avoid increasing overall dose of her antihypertensives at this point due to her lower side diastolics in the 50s and 40s.  Diastolic today is at 60.  We will try splitting her amlodipine to 2.5 mg twice daily.  Hopefully this will help some with the labile readings.  They will continue monitor at home and let us know if she has any persistent elevations.  Lumbar compression fracture [facility]) Had lengthy discussion with patient today regarding pain control.  Currently on Tylenol 1000 mg 3 times daily and tramadol 50 mg twice daily.  We did discuss increasing dose or frequency of tramadol however she declined at this point due to concerns for possible worsening somnolence or constipation.  Also did discuss trial of alternative daily medic patient such as gabapentin or Cymbalta.  She declined both of these options as well.  We briefly did discuss NSAIDs however she has had issues with gastritis and GI bleeds in the past and thus we will try to avoid these going forward as well.  She would like to continue with her current regimen for now.  She is also using topical lidocaine patches.  She will discuss further with her daughter and let us know if she changes her mind about either the Cymbalta or the gabapentin between now and her next visit.  Follow-up in 3 months.  Dermatitis Only modest improvement with topical triamcinolone.  We did discuss referral to dermatology versus biopsy however they declined.  Will go back clobetasol as this worked better for her previously.  They will follow-up with [place] in a few weeks to let us know how they are doing.  Still no improvement will need to biopsy or refer to Derm at that  point.  Flu shot given today.     Subjective:  HPI:  See A/P for status of chronic conditions.  Patient is here today for 25-month follow-up.  She was last seen here 3 months ago.  At that point we were having issues with periodic elevated blood pressure readings.  We continued amlodipine 5 mg daily.  She is here today with her daughter.  Blood pressures have still been labile at her facility.  Readings from the 130s to 190s to 200s.  Overall averages are more in the 130s to 140s.  No chest pain or shortness of breath.  No syncope.  No lightheadedness.  Still has ongoing issues with low back pain.  She did see orthopedics for this a couple of months ago.  They recommended continue with conservative therapy and recommended against any narcotic therapy.       Objective:  Physical Exam: BP 134/60   Pulse 75   Temp (!) 97.3 F (36.3 C) (Temporal)   Wt 117 lb 3.2 oz (53.2 kg)   SpO2 96%   BMI 20.12 kg/m   Gen: No acute distress, resting comfortably Neuro: Grossly normal, moves all extremities Psych: Normal affect and thought content      Linda Quinby M. Jimmey Ralph, MD 05/08/2023 2:00 PM

## 2023-05-08 NOTE — Assessment & Plan Note (Signed)
At goal today however she is still having labile readings in the 180s and 190s at her facility.  These are asymptomatic readings.  Would avoid increasing overall dose of her antihypertensives at this point due to her lower side diastolics in the 50s and 40s.  Diastolic today is at 60.  We will try splitting her amlodipine to 2.5 mg twice daily.  Hopefully this will help some with the labile readings.  They will continue monitor at home and let us know if she has any persistent elevations.

## 2023-05-08 NOTE — Patient Instructions (Signed)
It was very nice to see you today!  We gave your flu shot today.  Please start the triamcinolone and start clobetasol.  Stop amlodipine 5 mg daily and start amlodipine 2.5 mg twice daily.  Let me know if you change your mind about the Cymbalta or gabapentin.  Return in about 3 months (around 08/07/2023) for Follow Up.   Take care, Dr Jimmey Ralph  PLEASE NOTE:  If you had any lab tests, please let us know if you have not heard back within a few days. You may see your results on mychart before we have a chance to review them but we will give you a call once they are reviewed by [place].   If we ordered any referrals today, please let us know if you have not heard from their office within the next week.   If you had any urgent prescriptions sent in today, please check with the pharmacy within an hour of our visit to make sure the prescription was transmitted appropriately.   Please try these tips to maintain a healthy lifestyle:  Eat at least 3 REAL meals and 1-2 snacks per day.  Aim for no more than 5 hours between eating.  If you eat breakfast, please do so within one hour of getting up.   Each meal should contain half fruits/vegetables, one quarter protein, and one quarter carbs (no bigger than a computer mouse)  Cut down on sweet beverages. This includes juice, soda, and sweet tea.   Drink at least 1 glass of water with each meal and aim for at least 8 glasses per day  Exercise at least 150 minutes every week.

## 2023-05-08 NOTE — Assessment & Plan Note (Signed)
Had lengthy discussion with patient today regarding pain control.  Currently on Tylenol 1000 mg 3 times daily and tramadol 50 mg twice daily.  We did discuss increasing dose or frequency of tramadol however she declined at this point due to concerns for possible worsening somnolence or constipation.  Also did discuss trial of alternative daily medic patient such as gabapentin or Cymbalta.  She declined both of these options as well.  We briefly did discuss NSAIDs however she has had issues with gastritis and GI bleeds in the past and thus we will try to avoid these going forward as well.  She would like to continue with her current regimen for now.  She is also using topical lidocaine patches.  She will discuss further with her daughter and let us know if she changes her mind about either the Cymbalta or the gabapentin between now and her next visit.  Follow-up in 3 months.

## 2023-05-25 ENCOUNTER — Other Ambulatory Visit: Payer: Self-pay | Admitting: Family Medicine

## 2023-05-29 ENCOUNTER — Other Ambulatory Visit: Payer: Self-pay | Admitting: Family Medicine

## 2023-05-29 DIAGNOSIS — Z8673 Personal history of transient ischemic attack (TIA), and cerebral infarction without residual deficits: Secondary | ICD-10-CM

## 2023-06-13 ENCOUNTER — Telehealth: Payer: Self-pay | Admitting: Family Medicine

## 2023-06-13 DIAGNOSIS — Z0279 Encounter for issue of other medical certificate: Secondary | ICD-10-CM

## 2023-06-13 NOTE — Telephone Encounter (Signed)
Received faxed document [facility] , to be filled out by provider. Patient requested to send it back via Fax within ASAP. Document is located in providers tray at front office.Please advise

## 2023-06-18 ENCOUNTER — Other Ambulatory Visit: Payer: Self-pay | Admitting: Family Medicine

## 2023-06-18 NOTE — Telephone Encounter (Signed)
Placed in PCP office to be reviewed  

## 2023-06-19 ENCOUNTER — Other Ambulatory Visit: Payer: Self-pay | Admitting: Family Medicine

## 2023-06-20 ENCOUNTER — Other Ambulatory Visit: Payer: Self-pay | Admitting: Family Medicine

## 2023-06-25 ENCOUNTER — Encounter: Payer: Self-pay | Admitting: Family Medicine

## 2023-06-25 ENCOUNTER — Ambulatory Visit (INDEPENDENT_AMBULATORY_CARE_PROVIDER_SITE_OTHER): Payer: Medicare Other | Admitting: Family Medicine

## 2023-06-25 VITALS — BP 130/60 | HR 73 | Temp 97.9°F | Ht 64.0 in | Wt 117.0 lb

## 2023-06-25 DIAGNOSIS — J988 Other specified respiratory disorders: Secondary | ICD-10-CM

## 2023-06-25 DIAGNOSIS — B9689 Other specified bacterial agents as the cause of diseases classified elsewhere: Secondary | ICD-10-CM | POA: Diagnosis not present

## 2023-06-25 LAB — POC COVID19 BINAXNOW: SARS Coronavirus 2 Ag: NEGATIVE

## 2023-06-25 LAB — POCT INFLUENZA A/B
Influenza A, POC: NEGATIVE
Influenza B, POC: NEGATIVE

## 2023-06-25 MED ORDER — AZITHROMYCIN 250 MG PO TABS
ORAL_TABLET | ORAL | 0 refills | Status: AC
Start: 2023-06-25 — End: 2023-06-30

## 2023-06-25 MED ORDER — GUAIFENESIN ER 600 MG PO TB12
600.0000 mg | ORAL_TABLET | Freq: Two times a day (BID) | ORAL | 0 refills | Status: DC
Start: 2023-06-25 — End: 2023-06-25

## 2023-06-25 MED ORDER — GUAIFENESIN ER 600 MG PO TB12
600.0000 mg | ORAL_TABLET | Freq: Two times a day (BID) | ORAL | 0 refills | Status: DC | PRN
Start: 2023-06-25 — End: 2023-10-31

## 2023-06-25 NOTE — Progress Notes (Signed)
Assessment & Plan:  1. Bacterial respiratory infection - POC COVID-19 BinaxNow - POCT Influenza A/B - azithromycin (ZITHROMAX) 250 MG tablet; Take 2 tablets (500 mg total) by mouth daily for 1 day, THEN 1 tablet (250 mg total) daily for 4 days.  Dispense: 6 each; Refill: 0 - guaiFENesin (MUCINEX) 600 MG 12 hr tablet; Take 1 tablet (600 mg total) by mouth 2 (two) times daily as needed.  Dispense: 30 tablet; Refill: 0  Results for orders placed or performed in visit on 06/25/23  POC COVID-19 BinaxNow  Result Value Ref Range   SARS Coronavirus 2 Ag Negative Negative  POCT Influenza A/B  Result Value Ref Range   Influenza A, POC Negative Negative   Influenza B, POC Negative Negative    Follow up plan: Return if symptoms worsen or fail to improve.  Deliah Boston, MSN, APRN, FNP-C  Subjective:  HPI: Linda [name] is a 89 y.o. female presenting on 06/25/2023 for URI (Cough, congestion, ST, stuffy nose - yellow /No fever, aches or Gi upset. /Started last Tuesday with ST, all s/s started Wed )  Patient is accompanied by her daughter, who she is okay with being present.  Patient complains of cough, head congestion, and sore throat. She denies fever, shortness of breath, wheezing, nausea, vomiting, diarrhea, and sputum production with her cough . Onset of symptoms was 1 week ago, initially worsening, and then never improving. She is drinking moderate amounts of fluids. Evaluation to date: none. Treatment to date:  Mucinex-DM . She has a history of COPD and bronchiectasis. She does not smoke.    ROS: Negative unless specifically indicated above in HPI.   Relevant past medical history reviewed and updated as indicated.   Allergies and medications reviewed and updated.   Current Outpatient Medications:    amLODipine (NORVASC) 2.5 MG tablet, Take 1 tablet (2.5 mg total) by mouth in the morning and at bedtime. 8 am and 8 pm, Disp: 180 tablet, Rfl: 3   aspirin EC 81 MG tablet, TAKE (1)  TABLET BY MOUTH ONCE DAILY., Disp: 30 tablet, Rfl: 0   bisacodyl (DULCOLAX) 10 MG suppository, UNWRAP AND INSERT 1 (10MG ) SUPPOSITORY PER RECTUM EVERY 24 HOURS AS NEEDED FOR CONSTIPATION., Disp: 30 suppository, Rfl: 0   clobetasol ointment (TEMOVATE) 0.05 %, APPLY TOPICALLY TO AFFECTED AREA(S) TWICE DAILY., Disp: 60 g, Rfl: PRN   DHA-EPA-Flaxseed Oil-Vitamin E (THERA TEARS NUTRITION PO), Place 1 drop into both eyes in the morning, at noon, in the evening, and at bedtime. *wait 5 minutes between timolol eye drops*, Disp: , Rfl:    famotidine (PEPCID) 20 MG tablet, Take 1 tablet (20 mg total) by mouth 2 (two) times daily. Patient needs follow up appointment for future refills. Please call [phone] to schedule an appointment., Disp: 180 tablet, Rfl: 0   lidocaine (LIDODERM) 5 %, APPLY (1) PATCH TO LOWER BACK ONCE DAILY AT BEDTIME. LEAVE ON 12 HOURS. LEAVE OFF 12 HOURS.**MAY USE 4% PATCHES PROVIDED BY FAMILY**, Disp: 30 patch, Rfl: 11   mirtazapine (REMERON) 30 MG tablet, Take 30 mg by mouth at bedtime., Disp: , Rfl:    PAIN RELIEF EXTRA STRENGTH 500 MG tablet, TAKE (2) TABLETS (1,000MG ) BY MOUTH THREE TIMES DAILY., Disp: 180 tablet, Rfl: 0   polyethylene glycol (MIRALAX / GLYCOLAX) 17 g packet, Take 17 g by mouth daily., Disp: , Rfl:    QC VITAMIN D3 25 MCG (1000 UT) capsule, TAKE (1) CAPSULE BY MOUTH TWICE DAILY., Disp: 60 capsule, Rfl: 0  rosuvastatin (CRESTOR) 20 MG tablet, TAKE 1 TABLET DAILY, Disp: 90 tablet, Rfl: 3   simethicone (INFANTS SIMETHICONE) 40 MG/0.6ML drops, GIVE 0.6ML (40MG ) BY MOUTH TWICE DAILY AFTER LUNCH AND DINNER., Disp: 30 mL, Rfl: PRN   sotalol (BETAPACE) 80 MG tablet, TAKE 1 TABLET EVERY 12 HOURS (Patient taking differently: Take 80 mg by mouth 2 (two) times daily.), Disp: 180 tablet, Rfl: 3   timolol (TIMOPTIC) 0.25 % ophthalmic solution, Place 1 drop into both eyes daily., Disp: , Rfl:    traMADol (ULTRAM) 50 MG tablet, TAKE (1) TABLET BY MOUTH TWICE DAILY., Disp: 60  tablet, Rfl: 0   triamcinolone cream (KENALOG) 0.1 %, APPLY TOPICALLY TO SCALY PATCH ON LEFT SIDE OF NECK TWICE DAILY., Disp: 30 g, Rfl: 11  No Known Allergies  Objective:   BP 130/60   Pulse 73   Temp 97.9 F (36.6 C)   Ht 5\' 4"  (1.626 m)   Wt 117 lb (53.1 kg)   SpO2 96%   BMI 20.08 kg/m    Physical Exam Vitals reviewed.  Constitutional:      General: She is not in acute distress.    Appearance: Normal appearance. She is not ill-appearing, toxic-appearing or diaphoretic.  HENT:     Head: Normocephalic and atraumatic.     Right Ear: Tympanic membrane, ear canal and external ear normal. There is no impacted cerumen.     Left Ear: Tympanic membrane, ear canal and external ear normal. There is no impacted cerumen.     Nose: Nose normal. No congestion or rhinorrhea.     Right Sinus: No maxillary sinus tenderness or frontal sinus tenderness.     Left Sinus: No maxillary sinus tenderness or frontal sinus tenderness.     Mouth/Throat:     Mouth: Mucous membranes are moist.     Pharynx: Oropharynx is clear. No oropharyngeal exudate or posterior oropharyngeal erythema.  Eyes:     General: No scleral icterus.       Right eye: No discharge.        Left eye: No discharge.     Conjunctiva/sclera: Conjunctivae normal.  Cardiovascular:     Rate and Rhythm: Normal rate and regular rhythm.     Heart sounds: Normal heart sounds. No murmur heard.    No friction rub. No gallop.  Pulmonary:     Effort: Pulmonary effort is normal. No respiratory distress.     Breath sounds: Normal breath sounds. No stridor. No wheezing, rhonchi or rales.  Musculoskeletal:        General: Normal range of motion.     Cervical back: Normal range of motion.  Lymphadenopathy:     Cervical: No cervical adenopathy.  Skin:    General: Skin is warm and dry.     Capillary Refill: Capillary refill takes less than 2 seconds.  Neurological:     General: No focal deficit present.     Mental Status: She is alert and  oriented to person, place, and time. Mental status is at baseline.  Psychiatric:        Mood and Affect: Mood normal.        Behavior: Behavior normal.        Thought Content: Thought content normal.        Judgment: Judgment normal.

## 2023-06-26 ENCOUNTER — Ambulatory Visit: Payer: Medicare Other | Admitting: Family

## 2023-07-27 ENCOUNTER — Other Ambulatory Visit: Payer: Self-pay | Admitting: Family Medicine

## 2023-07-30 ENCOUNTER — Other Ambulatory Visit: Payer: Self-pay | Admitting: Family Medicine

## 2023-07-30 DIAGNOSIS — Z8673 Personal history of transient ischemic attack (TIA), and cerebral infarction without residual deficits: Secondary | ICD-10-CM

## 2023-07-30 NOTE — Telephone Encounter (Signed)
Medication: Tramadol 50 mg  Directions: Take 1 tablet by mouth 2 times a day Last given: 05/28/23 Number refills: 0 Last o/v: 05/08/23 Follow up: 08/28/23 Labs:

## 2023-08-01 ENCOUNTER — Other Ambulatory Visit: Payer: Self-pay | Admitting: Family Medicine

## 2023-08-02 NOTE — Telephone Encounter (Signed)
I sent in refill earlier this week.  Linda [name]. Jimmey Ralph, MD 08/02/2023 3:43 PM

## 2023-08-28 ENCOUNTER — Ambulatory Visit: Payer: Medicare Other | Admitting: Family Medicine

## 2023-08-28 ENCOUNTER — Other Ambulatory Visit: Payer: Self-pay | Admitting: Family Medicine

## 2023-08-29 ENCOUNTER — Other Ambulatory Visit: Payer: Self-pay | Admitting: Family Medicine

## 2023-09-06 ENCOUNTER — Ambulatory Visit: Payer: Medicare Other | Admitting: Family Medicine

## 2023-09-13 ENCOUNTER — Encounter: Payer: Self-pay | Admitting: Family Medicine

## 2023-09-13 ENCOUNTER — Ambulatory Visit: Payer: Medicare Other | Admitting: Family Medicine

## 2023-09-13 VITALS — BP 147/67 | HR 67 | Temp 97.5°F | Ht 64.0 in | Wt 119.6 lb

## 2023-09-13 DIAGNOSIS — S32000G Wedge compression fracture of unspecified lumbar vertebra, subsequent encounter for fracture with delayed healing: Secondary | ICD-10-CM

## 2023-09-13 DIAGNOSIS — K5900 Constipation, unspecified: Secondary | ICD-10-CM

## 2023-09-13 DIAGNOSIS — R739 Hyperglycemia, unspecified: Secondary | ICD-10-CM | POA: Diagnosis not present

## 2023-09-13 DIAGNOSIS — I10 Essential (primary) hypertension: Secondary | ICD-10-CM

## 2023-09-13 DIAGNOSIS — M1990 Unspecified osteoarthritis, unspecified site: Secondary | ICD-10-CM

## 2023-09-13 MED ORDER — POLYETHYLENE GLYCOL 3350 17 G PO PACK: 17.0000 g | PACK | Freq: Two times a day (BID) | ORAL | 5 refills | Status: DC

## 2023-09-13 MED ORDER — DULOXETINE HCL 20 MG PO CPEP: 20.0000 mg | ORAL_CAPSULE | Freq: Every evening | ORAL | 3 refills | Status: DC

## 2023-09-13 NOTE — Assessment & Plan Note (Signed)
Had lengthy discussion with patient today and her daughter regarding her pain control.  She is currently on Tylenol 1000 mg 3 times daily and tramadol 50 mg twice daily.  They are interested in additional medication at this point.  We discussed options.  Daughter is interested in trial of Cymbalta.  I believe this would be reasonable.  She is on Remeron however do not anticipate that this would cause much of an issue with excess serotonin.  We will start low-dose Cymbalta 20 mg daily.  We did discuss potential side effects.  They will follow-up with [place] in a few weeks and we can adjust dose as needed.  If she does have any signs or symptoms of excess serotonin such as increasing anxiety, jitteriness, tremors, etc. we will discontinue.  If she does not do well Cymbalta we could consider trial of gabapentin in the future.  I will follow-up with me in 3 months for in person office visit though will send [place] a message in a few weeks via MyChart.

## 2023-09-13 NOTE — Assessment & Plan Note (Signed)
Initially elevated and improved to 147/67 on recheck.  She is still having labile readings at her facility as high as in the 180s and 190s however usually she is at goal.  She does have lower side diastolic readings into the 50s and 60s as well.  At this point, due to her age and comorbidities we will not treat aggressively to prevent any hypotensive spells.  She will continue amlodipine 2.5 mg twice daily. They will monitor and let us know if she is having persistent elevations.

## 2023-09-13 NOTE — Patient Instructions (Signed)
It was very nice to see you today!  Please increase your MiraLAX to twice daily.  We will continue to monitor your blood pressure.  We will start Cymbalta.  Let me know in a few weeks how this is working.  No follow-ups on file.   Take care, Dr Jimmey Ralph  PLEASE NOTE:  If you had any lab tests, please let us know if you have not heard back within a few days. You may see your results on mychart before we have a chance to review them but we will give you a call once they are reviewed by [place].   If we ordered any referrals today, please let us know if you have not heard from their office within the next week.   If you had any urgent prescriptions sent in today, please check with the pharmacy within an hour of our visit to make sure the prescription was transmitted appropriately.   Please try these tips to maintain a healthy lifestyle:  Eat at least 3 REAL meals and 1-2 snacks per day.  Aim for no more than 5 hours between eating.  If you eat breakfast, please do so within one hour of getting up.   Each meal should contain half fruits/vegetables, one quarter protein, and one quarter carbs (no bigger than a computer mouse)  Cut down on sweet beverages. This includes juice, soda, and sweet tea.   Drink at least 1 glass of water with each meal and aim for at least 8 glasses per day  Exercise at least 150 minutes every week.

## 2023-09-13 NOTE — Progress Notes (Signed)
Linda [name] is a 89 y.o. female who presents today for an office visit.  Assessment/Plan:  Chronic Problems Addressed Today: Lumbar compression fracture [facility]) Had lengthy discussion with patient today and her daughter regarding her pain control.  She is currently on Tylenol 1000 mg 3 times daily and tramadol 50 mg twice daily.  They are interested in additional medication at this point.  We discussed options.  Daughter is interested in trial of Cymbalta.  I believe this would be reasonable.  She is on Remeron however do not anticipate that this would cause much of an issue with excess serotonin.  We will start low-dose Cymbalta 20 mg daily.  We did discuss potential side effects.  They will follow-up with [place] in a few weeks and we can adjust dose as needed.  If she does have any signs or symptoms of excess serotonin such as increasing anxiety, jitteriness, tremors, etc. we will discontinue.  If she does not do well Cymbalta we could consider trial of gabapentin in the future.  I will follow-up with me in 3 months for in person office visit though will send [place] a message in a few weeks via MyChart.  Osteoarthritis Overall symptoms are stable.  She will continue pain management as above.  Constipation She is still having quite a bit of issue with intermittent constipation.  We did discuss importance of good hydration.  She will try to make sure that she is getting more fluids.  Will also increase her MiraLAX to 2 doses daily.  Discussed goal of having 1-2 soft bowel movements daily.  HTN (hypertension) Initially elevated and improved to 147/67 on recheck.  She is still having labile readings at her facility as high as in the 180s and 190s however usually she is at goal.  She does have lower side diastolic readings into the 50s and 60s as well.  At this point, due to her age and comorbidities we will not treat aggressively to prevent any hypotensive spells.  She will continue amlodipine 2.5 mg  twice daily. They will monitor and let us know if she is having persistent elevations.     Subjective:  HPI:  See Assessment / plan for status of chronic conditions.  Patient is here today with her daughter.  Overall is doing well.  She still has ongoing issues with chronic back pain.  Takes Tylenol and tramadol with some improvement.  She may be interested in starting Cymbalta.  She is still having ongoing issues with constipation as well.  Her daughters try to get her to take more fluids.  She is taking 1 dose of MiraLAX daily with some improvement.  Sometimes will have a bowel movement every day though does occasionally go 2 to 3 days in between bowel movements.  Sometimes will be difficult to pass stool.  Sometimes will be small hard stool balls.       Objective:  Physical Exam: BP (!) 147/67   Pulse 67   Temp (!) 97.5 F (36.4 C) (Temporal)   Ht 5\' 4"  (1.626 m)   Wt 119 lb 9.6 oz (54.3 kg)   SpO2 96%   BMI 20.53 kg/m   Gen: No acute distress, resting comfortably CV: Regular rate and rhythm with no murmurs appreciated Pulm: Normal work of breathing, clear to auscultation bilaterally with no crackles, wheezes, or rhonchi Neuro: Grossly normal, moves all extremities Psych: Normal affect and thought content      Jermy Couper M. Jimmey Ralph, MD 09/13/2023 2:44 PM

## 2023-09-13 NOTE — Assessment & Plan Note (Signed)
She is still having quite a bit of issue with intermittent constipation.  We did discuss importance of good hydration.  She will try to make sure that she is getting more fluids.  Will also increase her MiraLAX to 2 doses daily.  Discussed goal of having 1-2 soft bowel movements daily.

## 2023-09-13 NOTE — Assessment & Plan Note (Signed)
Overall symptoms are stable.  She will continue pain management as above.

## 2023-09-14 LAB — COMPREHENSIVE METABOLIC PANEL
ALT: 10 U/L (ref 0–35)
AST: 21 U/L (ref 0–37)
Albumin: 4.1 g/dL (ref 3.5–5.2)
Alkaline Phosphatase: 123 U/L — ABNORMAL HIGH (ref 39–117)
BUN: 11 mg/dL (ref 6–23)
CO2: 28 meq/L (ref 19–32)
Calcium: 10 mg/dL (ref 8.4–10.5)
Chloride: 103 meq/L (ref 96–112)
Creatinine, Ser: 0.8 mg/dL (ref 0.40–1.20)
GFR: 61.72 mL/min (ref >60.00)
Glucose, Bld: 109 mg/dL — ABNORMAL HIGH (ref 70–99)
Potassium: 3.7 meq/L (ref 3.5–5.1)
Sodium: 140 meq/L (ref 135–145)
Total Bilirubin: 0.3 mg/dL (ref 0.2–1.2)
Total Protein: 6.9 g/dL (ref 6.0–8.3)

## 2023-09-14 LAB — CBC
HCT: 39.1 % (ref 36.0–46.0)
Hemoglobin: 13 g/dL (ref 12.0–15.0)
MCHC: 33.2 g/dL (ref 30.0–36.0)
MCV: 97.6 fL (ref 78.0–100.0)
Platelets: 261 10*3/uL (ref 150.0–400.0)
RBC: 4 Mil/uL (ref 3.87–5.11)
RDW: 12.8 % (ref 11.5–15.5)
WBC: 7.9 10*3/uL (ref 4.0–10.5)

## 2023-09-14 LAB — HEMOGLOBIN A1C: Hgb A1c MFr Bld: 6 % (ref 4.6–6.5)

## 2023-09-14 LAB — TSH: TSH: 0.6 u[IU]/mL (ref 0.35–5.50)

## 2023-09-17 ENCOUNTER — Encounter: Payer: Self-pay | Admitting: Family Medicine

## 2023-09-17 NOTE — Progress Notes (Signed)
Labs are all stable.  We can recheck in 6 to 12 months.

## 2023-09-28 ENCOUNTER — Other Ambulatory Visit: Payer: Self-pay | Admitting: *Deleted

## 2023-09-28 ENCOUNTER — Encounter: Payer: Self-pay | Admitting: Family Medicine

## 2023-09-28 NOTE — Telephone Encounter (Signed)
I appreciate the update.  I am sorry to hear the Cymbalta caused diarrhea.  We can send a order to discontinue.

## 2023-09-28 NOTE — Telephone Encounter (Signed)
Please advise  Clobetasol deleted from medication list

## 2023-10-03 ENCOUNTER — Other Ambulatory Visit: Payer: Self-pay | Admitting: *Deleted

## 2023-10-03 NOTE — Telephone Encounter (Signed)
 TDAP updated in patient chart  Rx Cymbalta and Clobetasol discontinue in current medication

## 2023-10-11 ENCOUNTER — Telehealth: Payer: Self-pay | Admitting: *Deleted

## 2023-10-11 NOTE — Telephone Encounter (Signed)
 See MyChart message. Ok to discontinue Cymbalta. We can fax over order as well.  Katina Degree. Jimmey Ralph, MD 10/11/2023 2:55 PM

## 2023-10-11 NOTE — Telephone Encounter (Signed)
 DC Rx faxed to [phone]

## 2023-10-11 NOTE — Telephone Encounter (Signed)
**Note De-identified  [place] Obfuscation** Please advise 

## 2023-10-11 NOTE — Telephone Encounter (Signed)
 Copied from CRM [id number]. Topic: Clinical - Medication Question >> Oct 11, 2023  9:56 AM Irine Seal wrote: Leta Jungling from [facility]- for clarification on patient's Cymbalta- stated the daughter is requesting he relay the message of the severe diarrhea its causing,  Per the notes, in the patient's chart, I advised him Cymbalta is being discontinued. Leta Jungling stated he would take that as a verbal order to discontinue the medication in the pharmacy and would call the patient's daughter to update her on the change.  Please advise  Harriet Sutphen,RMA

## 2023-10-11 NOTE — Telephone Encounter (Signed)
 Ok to send over order to discontinue.  Katina Degree. Jimmey Ralph, MD 10/11/2023 2:52 PM

## 2023-10-12 NOTE — Telephone Encounter (Signed)
 Form faxed to [phone] multiple time faxed fail Form faxed to [phone]

## 2023-10-29 ENCOUNTER — Telehealth: Payer: Self-pay | Admitting: Family Medicine

## 2023-10-29 NOTE — Telephone Encounter (Signed)
 [facility] faxed FL2, to be filled out by provider. Patient requested to send it back via Fax within 5-days. Document is located in providers tray at front office.Please advise.

## 2023-10-30 DIAGNOSIS — Z0279 Encounter for issue of other medical certificate: Secondary | ICD-10-CM

## 2023-10-30 NOTE — Telephone Encounter (Signed)
Form placed in PCP office to be reviewed and sign

## 2023-10-31 ENCOUNTER — Ambulatory Visit (INDEPENDENT_AMBULATORY_CARE_PROVIDER_SITE_OTHER): Admitting: Family Medicine

## 2023-10-31 ENCOUNTER — Encounter: Payer: Self-pay | Admitting: Family Medicine

## 2023-10-31 VITALS — BP 142/62 | HR 64 | Temp 97.5°F | Resp 16 | Ht 64.0 in | Wt 117.4 lb

## 2023-10-31 DIAGNOSIS — R197 Diarrhea, unspecified: Secondary | ICD-10-CM | POA: Diagnosis not present

## 2023-10-31 NOTE — Progress Notes (Signed)
 Subjective:     Patient ID: Linda [name], female    DOB: 1923/10/08, 89 y.o.   MRN: [id number]  Chief Complaint  Patient presents with   Diarrhea    Started about 12 days ago, still haven't diarrhea went away from having a stomach virus at the end of February Very watery, better today, went twice today, when passing gas, has occasional leakage    HPI Discussed the use of AI scribe software for clinical note transcription with the patient, who gave verbal consent to proceed.  History of Present Illness   Linda [name] is a 89 year old female who presents with persistent diarrhea for 12-14 days. She is accompanied by her daughter, Elnita Maxwell.  She has been experiencing diarrhea for the past 12 to 14 days, with episodes primarily occurring in the early morning hours around 3 to 4 AM. The stool is very loose and liquid, occurring two to three times a day. No abdominal pain, fever, or chills. A particularly severe episode occurred the previous day, but today was slightly better, with two episodes in the early morning. No blood is present in the stool, which is described as brown liquid with small specks. The urgency of the diarrhea has led to accidents, impacting her ability to reach the bathroom in time.  Daughter reports a history of norovirus in February, which caused vomiting and diarrhea that resolved within a week. However, the current diarrhea has persisted beyond that episode. the norovirus ran through the ECF  Her dietary intake has been poor due to dissatisfaction with the food quality, leading to reduced food consumption. She has been drinking water but has not been consuming electrolyte-rich drinks like Pedialyte or Gatorade. She has lost a couple of pounds since January but maintains the same weight as in November.  She has taken Imodium occasionally, about one to three times over five or six days, to manage the diarrhea. She has not been on any antibiotics recently, with the last  course being a Z-Pak in November. She does not take Miralax currently, which she previously used for constipation. In terms of her bowel habits, she does not have daily bowel movements, which she would prefer, but typically does not experience significant bowel issues. She wants more regular bowel movements.       Health Maintenance Due  Topic Date Due   Medicare Annual Wellness (AWV)  10/31/2023    Past Medical History:  Diagnosis Date   Arthritis    COPD (chronic obstructive pulmonary disease) (HCC)    chronic cough   Glaucoma    Hyperlipidemia    Hypertension    Irregular heartbeat    Osteoporosis    Thyroid disease    enlarged thyroid    Past Surgical History:  Procedure Laterality Date   APPENDECTOMY     cateract surgery     COLONOSCOPY     Done in [place]  yrs ago   UPPER GASTROINTESTINAL ENDOSCOPY  2019   WRIST SURGERY       Current Outpatient Medications:    amLODipine (NORVASC) 2.5 MG tablet, Take 1 tablet (2.5 mg total) by mouth in the morning and at bedtime. 8 am and 8 pm, Disp: 180 tablet, Rfl: 3   aspirin EC 81 MG tablet, TAKE (1) TABLET BY MOUTH ONCE DAILY. **DO NOT CRUSH**, Disp: 30 tablet, Rfl: 11   bisacodyl (DULCOLAX) 10 MG suppository, UNWRAP AND INSERT 1 (10MG ) SUPPOSITORY PER RECTUM EVERY 24 HOURS AS NEEDED FOR CONSTIPATION., Disp: 30 suppository,  Rfl: 0   DHA-EPA-Flaxseed Oil-Vitamin E (THERA TEARS NUTRITION PO), Place 1 drop into both eyes in the morning, at noon, in the evening, and at bedtime. *wait 5 minutes between timolol eye drops*, Disp: , Rfl:    famotidine (PEPCID) 20 MG tablet, Take 1 tablet (20 mg total) by mouth 2 (two) times daily. Patient needs follow up appointment for future refills. Please call [phone] to schedule an appointment., Disp: 180 tablet, Rfl: 0   lidocaine (LIDODERM) 5 %, APPLY (1) PATCH TO LOWER BACK ONCE DAILY AT BEDTIME. LEAVE ON 12 HOURS. LEAVE OFF 12 HOURS.**MAY USE 4% PATCHES PROVIDED BY FAMILY**, Disp: 30 patch, Rfl:  11   mirtazapine (REMERON) 30 MG tablet, Take 30 mg by mouth at bedtime., Disp: , Rfl:    PAIN RELIEF EXTRA STRENGTH 500 MG tablet, TAKE (2) TABLETS (1,000MG ) BY MOUTH THREE TIMES DAILY., Disp: 180 tablet, Rfl: 11   polyethylene glycol (MIRALAX / GLYCOLAX) 17 g packet, Take 17 g by mouth 2 (two) times daily., Disp: 14 each, Rfl: 5   QC VITAMIN D3 25 MCG (1000 UT) capsule, TAKE (1) CAPSULE BY MOUTH TWICE DAILY., Disp: 60 capsule, Rfl: 11   rosuvastatin (CRESTOR) 20 MG tablet, TAKE 1 TABLET DAILY, Disp: 90 tablet, Rfl: 3   simethicone (INFANTS SIMETHICONE) 40 MG/0.6ML drops, GIVE 0.6ML (40MG ) BY MOUTH TWICE DAILY AFTER LUNCH AND DINNER., Disp: 30 mL, Rfl: PRN   sotalol (BETAPACE) 80 MG tablet, TAKE 1 TABLET EVERY 12 HOURS (Patient taking differently: Take 80 mg by mouth 2 (two) times daily.), Disp: 180 tablet, Rfl: 3   timolol (TIMOPTIC) 0.25 % ophthalmic solution, Place 1 drop into both eyes daily., Disp: , Rfl:    traMADol (ULTRAM) 50 MG tablet, TAKE (1) TABLET BY MOUTH TWICE DAILY., Disp: 60 tablet, Rfl: 5   triamcinolone cream (KENALOG) 0.1 %, APPLY TOPICALLY TO SCALY PATCH ON LEFT SIDE OF NECK TWICE DAILY., Disp: 30 g, Rfl: 11  No Known Allergies ROS neg/noncontributory except as noted HPI/below      Objective:     BP (!) 142/62 (BP Location: Left Arm, Patient Position: Sitting;Standing, Cuff Size: Normal)   Pulse 64   Temp (!) 97.5 F (36.4 C) (Temporal)   Resp 16   Ht 5\' 4"  (1.626 m)   Wt 117 lb 6 oz (53.2 kg)   SpO2 94%   BMI 20.15 kg/m  Wt Readings from Last 3 Encounters:  10/31/23 117 lb 6 oz (53.2 kg)  09/13/23 119 lb 9.6 oz (54.3 kg)  06/25/23 117 lb (53.1 kg)    Physical Exam   Gen: WDWN NAD HEENT: NCAT, conjunctiva not injected, sclera nonicteric NECK:  supple, no thyromegaly, no nodes, no carotid bruits CARDIAC: RRR, S1S2+, no murmur. DP 2+B LUNGS: CTAB. No wheezes ABDOMEN:  BS+, soft, NTND, No HSM, no masses EXT:  tr edema MSK: walker.  Hunched some  NEURO:  A&O x3.  CN II-XII intact.  PSYCH: normal mood. Good eye contact     Assessment & Plan:  Diarrhea, unspecified type -     CBC with Differential/Platelet -     Comprehensive metabolic panel -     Magnesium -     C. difficile GDH and Toxin A/B -     Gastrointestinal Pathogen Pnl RT, PCR  Assessment and Plan    Diarrhea   She has experienced diarrhea for 12-14 days, primarily in the early morning, with liquid brown stool and no blood. The diarrhea began after a norovirus outbreak, initially  causing vomiting and diarrhea that resolved, but the diarrhea persisted. There is concern for dehydration and electrolyte imbalance due to prolonged diarrhea, with accidents occurring due to urgency and frequency. The differential includes persistent viral infection, bacterial infection such as Clostridioides difficile (C. diff), or other gastrointestinal issues. She uses Imodium intermittently to manage symptoms. C. diff testing is considered due to prolonged symptoms  Order blood work to check electrolytes and white blood cell count. Provide stool sample containers for C. diff testing. Encourage adequate fluid intake, including options like Pedialyte, Gatorade, or electrolyte powders such as Cure or Liquid IV. Advise against daily use of Imodium to prevent constipation or masking of underlying issues. Discuss dietary options to improve nutrition and fluid intake, such as Ensure or baby food if preferred.  she has seen some improvement this am  General Health Maintenance   At 89 years old, she has maintained her weight since November despite recent illness. Focus on ensuring adequate nutrition and hydration to support overall health. Encourage regular fluid intake to prevent dehydration. Discuss dietary options to ensure adequate nutrition, including the use of nutritional supplements like Ensure.        Return if symptoms worsen or fail to improve.  Angelena Sole, MD

## 2023-10-31 NOTE — Patient Instructions (Signed)
 Labs.  Fluids  Stool culture

## 2023-11-01 ENCOUNTER — Encounter: Payer: Self-pay | Admitting: Family Medicine

## 2023-11-01 LAB — COMPREHENSIVE METABOLIC PANEL
ALT: 10 U/L (ref 0–35)
AST: 20 U/L (ref 0–37)
Albumin: 4 g/dL (ref 3.5–5.2)
Alkaline Phosphatase: 99 U/L (ref 39–117)
BUN: 9 mg/dL (ref 6–23)
CO2: 29 meq/L (ref 19–32)
Calcium: 10.2 mg/dL (ref 8.4–10.5)
Chloride: 104 meq/L (ref 96–112)
Creatinine, Ser: 0.73 mg/dL (ref 0.40–1.20)
GFR: 68.82 mL/min (ref >60.00)
Glucose, Bld: 90 mg/dL (ref 70–99)
Potassium: 3.9 meq/L (ref 3.5–5.1)
Sodium: 139 meq/L (ref 135–145)
Total Bilirubin: 0.3 mg/dL (ref 0.2–1.2)
Total Protein: 6.8 g/dL (ref 6.0–8.3)

## 2023-11-01 LAB — CBC WITH DIFFERENTIAL/PLATELET
Basophils Absolute: 0 10*3/uL (ref 0.0–0.1)
Basophils Relative: 0.7 % (ref 0.0–3.0)
Eosinophils Absolute: 0.3 10*3/uL (ref 0.0–0.7)
Eosinophils Relative: 4.9 % (ref 0.0–5.0)
HCT: 37.7 % (ref 36.0–46.0)
Hemoglobin: 12.6 g/dL (ref 12.0–15.0)
Lymphocytes Relative: 31.7 % (ref 12.0–46.0)
Lymphs Abs: 1.7 10*3/uL (ref 0.7–4.0)
MCHC: 33.6 g/dL (ref 30.0–36.0)
MCV: 96.6 fl (ref 78.0–100.0)
Monocytes Absolute: 0.6 10*3/uL (ref 0.1–1.0)
Monocytes Relative: 11.2 % (ref 3.0–12.0)
Neutro Abs: 2.8 10*3/uL (ref 1.4–7.7)
Neutrophils Relative %: 51.5 % (ref 43.0–77.0)
Platelets: 251 10*3/uL (ref 150.0–400.0)
RBC: 3.9 Mil/uL (ref 3.87–5.11)
RDW: 12.8 % (ref 11.5–15.5)
WBC: 5.4 10*3/uL (ref 4.0–10.5)

## 2023-11-01 LAB — MAGNESIUM: Magnesium: 2.1 mg/dL (ref 1.5–2.5)

## 2023-11-01 NOTE — Progress Notes (Signed)
 Labs good

## 2023-11-15 ENCOUNTER — Other Ambulatory Visit: Payer: Self-pay | Admitting: Family Medicine

## 2023-11-19 ENCOUNTER — Other Ambulatory Visit: Payer: Self-pay | Admitting: Family Medicine

## 2023-12-05 ENCOUNTER — Ambulatory Visit (INDEPENDENT_AMBULATORY_CARE_PROVIDER_SITE_OTHER)

## 2023-12-05 VITALS — Ht 64.0 in | Wt 117.0 lb

## 2023-12-05 DIAGNOSIS — Z0000 Encounter for general adult medical examination without abnormal findings: Secondary | ICD-10-CM

## 2023-12-05 NOTE — Progress Notes (Signed)
 Subjective:   Linda [name] is a 89 y.o. who presents for a Medicare Wellness preventive visit.  Visit Complete: Virtual I connected with  Linda [name] on 12/05/23 by a audio enabled telemedicine application and verified that I am speaking with the correct person using two identifiers.  Patient Location: Home  Provider Location: Home Office  I discussed the limitations of evaluation and management by telemedicine. The patient expressed understanding and agreed to proceed.  Vital Signs: Because this visit was a virtual/telehealth visit, some criteria may be missing or patient reported. Any vitals not documented were not able to be obtained and vitals that have been documented are patient reported.  VideoDeclined- This patient declined Librarian, academic. Therefore the visit was completed with audio only.  Persons Participating in Visit: Patient.  AWV Questionnaire: No: Patient Medicare AWV questionnaire was not completed prior to this visit.  Cardiac Risk Factors include: advanced age (>87men, >57 women);hypertension;dyslipidemia     Objective:    Today's Vitals   12/05/23 1509  Weight: 117 lb (53.1 kg)  Height: 5\' 4"  (1.626 m)   Body mass index is 20.08 kg/m.     12/05/2023    3:16 PM 10/31/2022   11:26 AM 08/08/2022    5:16 PM 05/12/2022   10:40 AM 11/08/2021   11:15 AM 09/13/2021   10:39 PM 08/12/2021    2:40 PM  Advanced Directives  Does Patient Have a Medical Advance Directive? Yes Yes No Yes Yes Yes Yes  Type of Estate agent of [place];Living will Healthcare Power of [place];Living will   Healthcare Power of [place];Living will Healthcare Power of [employer] Power of Attorney  Does patient want to make changes to medical advance directive? No - Patient declined No - Patient declined       Copy of Healthcare Power of Attorney in Chart? Yes - validated most recent copy scanned in chart (See row information)  Yes - validated most recent copy scanned in chart (See row information)     Yes - validated most recent copy scanned in chart (See row information)  Would patient like information on creating a medical advance directive?   No - Patient declined        Current Medications (verified) Outpatient Encounter Medications as of 12/05/2023  Medication Sig   amLODipine  (NORVASC ) 2.5 MG tablet Take 1 tablet (2.5 mg total) by mouth in the morning and at bedtime. 8 am and 8 pm   aspirin  EC 81 MG tablet TAKE (1) TABLET BY MOUTH ONCE DAILY. **DO NOT CRUSH**   bisacodyl (DULCOLAX) 10 MG suppository UNWRAP AND INSERT 1 (10MG ) SUPPOSITORY PER RECTUM EVERY 24 HOURS AS NEEDED FOR CONSTIPATION.   CALCIUM  ANTACID EXTRA STRENGTH 750 MG chewable tablet CHEW (1) TABLET BY MOUTH ONCE DAILY AS NEEDED FOR HEARTBURN.   DHA-EPA-Flaxseed Oil-Vitamin E (THERA TEARS NUTRITION PO) Place 1 drop into both eyes in the morning, at noon, in the evening, and at bedtime. *wait 5 minutes between timolol  eye drops*   famotidine  (PEPCID ) 20 MG tablet Take 1 tablet (20 mg total) by mouth 2 (two) times daily. Patient needs follow up appointment for future refills. Please call [phone] to schedule an appointment.   GOODSENSE CLEARLAX 17 GM/SCOOP powder MIX 17G (1 CAPFUL) INTO 8 OUNCES OF JUICE/WATER. DRINK ONCE EVERY 24 HOURS AS NEEDED FOR CONSTIPATION.   lidocaine  (LIDODERM ) 5 % APPLY (1) PATCH TO LOWER BACK ONCE DAILY AT BEDTIME. LEAVE ON 12 HOURS. LEAVE OFF 12 HOURS.**MAY USE  4% PATCHES PROVIDED BY FAMILY**   mirtazapine  (REMERON ) 30 MG tablet Take 30 mg by mouth at bedtime.   PAIN RELIEF EXTRA STRENGTH 500 MG tablet TAKE (2) TABLETS (1,000MG ) BY MOUTH THREE TIMES DAILY.   polyethylene glycol (MIRALAX  / GLYCOLAX ) 17 g packet Take 17 g by mouth 2 (two) times daily.   QC VITAMIN D3 25 MCG (1000 UT) capsule TAKE (1) CAPSULE BY MOUTH TWICE DAILY.   rosuvastatin  (CRESTOR ) 20 MG tablet TAKE 1 TABLET DAILY   simethicone (INFANTS SIMETHICONE)  40 MG/0.6ML drops GIVE 0.6ML (40MG ) BY MOUTH TWICE DAILY AFTER LUNCH AND DINNER.   sotalol  (BETAPACE ) 80 MG tablet TAKE 1 TABLET EVERY 12 HOURS (Patient taking differently: Take 80 mg by mouth 2 (two) times daily.)   timolol  (TIMOPTIC ) 0.25 % ophthalmic solution Place 1 drop into both eyes daily.   traMADol  (ULTRAM ) 50 MG tablet TAKE (1) TABLET BY MOUTH TWICE DAILY.   triamcinolone  cream (KENALOG ) 0.1 % APPLY TOPICALLY TO SCALY PATCH ON LEFT SIDE OF NECK TWICE DAILY.   No facility-administered encounter medications on file as of 12/05/2023.    Allergies (verified) Patient has no known allergies.   History: Past Medical History:  Diagnosis Date   Arthritis    COPD (chronic obstructive pulmonary disease) (HCC)    chronic cough   Glaucoma    Hyperlipidemia    Hypertension    Irregular heartbeat    Osteoporosis    Thyroid  disease    enlarged thyroid    Past Surgical History:  Procedure Laterality Date   APPENDECTOMY     cateract surgery     COLONOSCOPY     Done in [place]  yrs ago   UPPER GASTROINTESTINAL ENDOSCOPY  2019   WRIST SURGERY     Family History  Problem Relation Age of Onset   Stroke Mother    Heart disease Mother    Heart disease Father    Heart disease Brother    Heart disease Brother    Colon cancer Neg Hx    Colon polyps Neg Hx    Esophageal cancer Neg Hx    Social History   Socioeconomic History   Marital status: Widowed    Spouse name: Not on file   Number of children: Not on file   Years of education: Not on file   Highest education level: Not on file  Occupational History   Occupation: Retired  Tobacco Use   Smoking status: Former    Current packs/day: 0.00    Average packs/day: 0.3 packs/day for 2.0 years (0.5 ttl pk-yrs)    Types: Cigarettes    Start date: 52    Quit date: 90    Years since quitting: 73.3    Passive exposure: Past   Smokeless tobacco: Never  Vaping Use   Vaping status: Never Used  Substance and Sexual Activity    Alcohol use: Not Currently   Drug use: Never   Sexual activity: Not Currently  Other Topics Concern   Not on file  Social History Narrative   Resident at [employer]    Enjoys playing cards    Social Drivers of Health   Financial Resource Strain: Low Risk  (12/05/2023)   Overall Financial Resource Strain (CARDIA)    Difficulty of Paying Living Expenses: Not hard at all  Food Insecurity: No Food Insecurity (12/05/2023)   Hunger Vital Sign    Worried About Running Out of Food in the Last Year: Never true    Ran Out of Food  in the Last Year: Never true  Transportation Needs: No Transportation Needs (12/05/2023)   PRAPARE - Administrator, Civil Service (Medical): No    Lack of Transportation (Non-Medical): No  Physical Activity: Insufficiently Active (12/05/2023)   Exercise Vital Sign    Days of Exercise per Week: 7 days    Minutes of Exercise per Session: 10 min  Stress: No Stress Concern Present (12/05/2023)   [employer] of Occupational Health - Occupational Stress Questionnaire    Feeling of Stress : Only a little  Social Connections: Socially Isolated (12/05/2023)   Social Connection and Isolation Panel [NHANES]    Frequency of Communication with Friends and Family: Once a week    Frequency of Social Gatherings with Friends and Family: Three times a week    Attends Religious Services: Never    Active Member of Clubs or Organizations: No    Attends Banker Meetings: Never    Marital Status: Widowed    Tobacco Counseling Counseling given: Not Answered    Clinical Intake:  Pre-visit preparation completed: Yes  Pain : No/denies pain     BMI - recorded: 20.08 Nutritional Status: BMI of 19-24  Normal Diabetes: No  Lab Results  Component Value Date   HGBA1C 6.0 09/13/2023   HGBA1C 5.8 (H) 09/20/2021     How often do you need to have someone help you when you read instructions, pamphlets, or other written materials from your doctor  or pharmacy?: 1 - Never  Interpreter Needed?: No  Information entered by :: Lamont Pilsner, LPN   Activities of Daily Living     12/05/2023    3:12 PM  In your present state of health, do you have any difficulty performing the following activities:  Hearing? 1  Comment hearing aids  Vision? 0  Difficulty concentrating or making decisions? 0  Walking or climbing stairs? 1  Comment avoid  Dressing or bathing? 0  Doing errands, shopping? 0  Preparing Food and eating ? Y  Using the Toilet? N  In the past six months, have you accidently leaked urine? N  Do you have problems with loss of bowel control? N  Managing your Medications? Y  Managing your Finances? Y  Housekeeping or managing your Housekeeping? Y    Patient Care Team: Rodney Clamp, MD as PCP - General (Family Medicine) Sheryle Donning, MD as PCP - Cardiology (Cardiology) Denson Flake, MD as Consulting Physician (Pulmonary Disease) Bridget Campion, MD as Consulting Physician (Physical Medicine and Rehabilitation) Rey Catholic, MD as Consulting Physician (Family Medicine)  Indicate any recent Medical Services you may have received from other than [facility] providers in the past year (date may be approximate).     Assessment:   This is a routine wellness examination for [place].  Hearing/Vision screen Hearing Screening - Comments:: Pt has hearing aids  Vision Screening - Comments:: Wears rx glasses - up to date with routine eye exams with Dr Terrall Ferraris     Goals Addressed             This Visit's Progress    Patient Stated       Maintain health and activity at my age        Depression Screen     12/05/2023    3:15 PM 09/13/2023    1:50 PM 06/25/2023    2:07 PM 05/08/2023    1:58 PM 02/01/2023    1:23 PM 01/16/2023    2:15 PM 10/31/2022  11:22 AM  PHQ 2/9 Scores  PHQ - 2 Score 1  0 5  0 4  PHQ- 9 Score    15   8  Exception Documentation  Patient refusal   Patient refusal      Fall Risk      12/05/2023    3:16 PM 09/13/2023    1:50 PM 06/25/2023    2:07 PM 05/08/2023    1:17 PM 02/01/2023    1:20 PM  Fall Risk   Falls in the past year? 0 0 0 0 0  Number falls in past yr: 0 0 0 0 0  Injury with Fall? 0 0 0 0 0  Risk for fall due to : Impaired balance/gait;Impaired mobility No Fall Risks Impaired balance/gait No Fall Risks;Impaired balance/gait No Fall Risks  Risk for fall due to: Comment    use walker   Follow up Falls prevention discussed  Falls evaluation completed      MEDICARE RISK AT HOME:  Medicare Risk at Home Any stairs in or around the home?: No If so, are there any without handrails?: No Home free of loose throw rugs in walkways, pet beds, electrical cords, etc?: Yes Adequate lighting in your home to reduce risk of falls?: Yes Life alert?: Yes Use of a cane, walker or w/c?: Yes Grab bars in the bathroom?: Yes Shower chair or bench in shower?: Yes Elevated toilet seat or a handicapped toilet?: Yes  TIMED UP AND GO:  Was the test performed?  No  Cognitive Function: 6CIT completed        12/05/2023    3:17 PM 10/31/2022   11:34 AM 12/25/2019   10:56 AM  6CIT Screen  What Year? 0 points 4 points 0 points  What month? 0 points 0 points 0 points  What time? 0 points 0 points 0 points  Count back from 20 0 points 0 points 0 points  Months in reverse 4 points 4 points 0 points  Repeat phrase 2 points 4 points 2 points  Total Score 6 points 12 points 2 points    Immunizations Immunization History  Administered Date(s) Administered   Fluad Quad(high Dose 65+) 05/18/2022   Fluad Trivalent(High Dose 65+) 05/08/2023   Influenza, High Dose Seasonal PF 05/15/2019   Influenza-Unspecified 06/04/2018, 05/07/2020, 05/27/2021   PFIZER(Purple Top)SARS-COV-2 Vaccination 08/28/2019, 09/17/2019, 06/02/2020, 02/03/2021   PNEUMOCOCCAL CONJUGATE-20 10/19/2020   Pfizer Covid-19 Vaccine Bivalent Booster 30yrs & up 06/12/2021, 05/07/2022   Pfizer(Comirnaty)Fall Seasonal  Vaccine 12 years and older 04/21/2023   RSV,unspecified 06/18/2022   Tdap 09/16/2023   Zoster Recombinant(Shingrix) 04/02/2021, 09/11/2021    Screening Tests Health Maintenance  Topic Date Due   COVID-19 Vaccine (8 - 2024-25 season) 10/19/2023   INFLUENZA VACCINE  03/14/2024   Medicare Annual Wellness (AWV)  12/04/2024   Pneumonia Vaccine 6+ Years old  Completed   Zoster Vaccines- Shingrix  Completed   HPV VACCINES  Aged Out   Meningococcal B Vaccine  Aged Out   DTaP/Tdap/Td  Discontinued   DEXA SCAN  Discontinued    Health Maintenance  Health Maintenance Due  Topic Date Due   COVID-19 Vaccine (8 - 2024-25 season) 10/19/2023   Health Maintenance Items Addressed: See Nurse Notes  Additional Screening:  Vision Screening: Recommended annual ophthalmology exams for early detection of glaucoma and other disorders of the eye.  Dental Screening: Recommended annual dental exams for proper oral hygiene  Community Resource Referral / Chronic Care Management: CRR required this visit?  No  CCM required this visit?  No     Plan:     I have personally reviewed and noted the following in the patient's chart:   Medical and social history Use of alcohol, tobacco or illicit drugs  Current medications and supplements including opioid prescriptions. Patient is currently taking opioid prescriptions. Information provided to patient regarding non-opioid alternatives. Patient advised to discuss non-opioid treatment plan with their provider. Functional ability and status Nutritional status Physical activity Advanced directives List of other physicians Hospitalizations, surgeries, and ER visits in previous 12 months Vitals Screenings to include cognitive, depression, and falls Referrals and appointments  In addition, I have reviewed and discussed with patient certain preventive protocols, quality metrics, and best practice recommendations. A written personalized care plan for  preventive services as well as general preventive health recommendations were provided to patient.     Bruno Capri, LPN   [id number]   After Visit Summary: (MyChart) Due to this being a telephonic visit, the after visit summary with patients personalized plan was offered to patient via MyChart   Notes: Nothing significant to report at this time.

## 2023-12-12 ENCOUNTER — Other Ambulatory Visit: Payer: Self-pay | Admitting: Family Medicine

## 2023-12-13 ENCOUNTER — Ambulatory Visit: Payer: Medicare Other | Admitting: Family Medicine

## 2023-12-20 ENCOUNTER — Encounter (HOSPITAL_COMMUNITY): Payer: Self-pay

## 2023-12-27 ENCOUNTER — Ambulatory Visit: Admitting: Family Medicine

## 2023-12-27 ENCOUNTER — Encounter: Payer: Self-pay | Admitting: Family Medicine

## 2023-12-27 VITALS — BP 126/60 | HR 68 | Temp 98.0°F | Ht 64.0 in | Wt 117.0 lb

## 2023-12-27 DIAGNOSIS — K5900 Constipation, unspecified: Secondary | ICD-10-CM

## 2023-12-27 DIAGNOSIS — S32000G Wedge compression fracture of unspecified lumbar vertebra, subsequent encounter for fracture with delayed healing: Secondary | ICD-10-CM | POA: Diagnosis not present

## 2023-12-27 DIAGNOSIS — I10 Essential (primary) hypertension: Secondary | ICD-10-CM | POA: Diagnosis not present

## 2023-12-27 DIAGNOSIS — R3911 Hesitancy of micturition: Secondary | ICD-10-CM

## 2023-12-27 MED ORDER — GABAPENTIN 100 MG PO CAPS: 100.0000 mg | ORAL_CAPSULE | Freq: Every day | ORAL | 3 refills | Status: DC

## 2023-12-27 MED ORDER — METAMUCIL FIBER 2 G PO CHEW: 4.0000 g | CHEWABLE_TABLET | Freq: Every day | ORAL | 5 refills | Status: DC

## 2023-12-27 NOTE — Patient Instructions (Signed)
 It was very nice to see you today!  Please start the fiber supplement.  We will add on gabapentin at night. Please let me know how this is working for you in a few weeks.   Return in about 3 months (around 03/28/2024).   Take care, Dr Daneil Dunker  PLEASE NOTE:  If you had any lab tests, please let us  know if you have not heard back within a few days. You may see your results on mychart before we have a chance to review them but we will give you a call once they are reviewed by us .   If we ordered any referrals today, please let us  know if you have not heard from their office within the next week.   If you had any urgent prescriptions sent in today, please check with the pharmacy within an hour of our visit to make sure the prescription was transmitted appropriately.   Please try these tips to maintain a healthy lifestyle:  Eat at least 3 REAL meals and 1-2 snacks per day.  Aim for no more than 5 hours between eating.  If you eat breakfast, please do so within one hour of getting up.   Each meal should contain half fruits/vegetables, one quarter protein, and one quarter carbs (no bigger than a computer mouse)  Cut down on sweet beverages. This includes juice, soda, and sweet tea.   Drink at least 1 glass of water with each meal and aim for at least 8 glasses per day  Exercise at least 150 minutes every week.

## 2023-12-27 NOTE — Assessment & Plan Note (Signed)
 Had lengthy discussion with patient today and her daughter regarding her pain control.  She is currently on Tylenol  1000 mg 3 times daily and tramadol  50 mg twice daily.  Also using lidocaine  patches.  Unfortunately did not react well with Cymbalta .  We did discuss alternatives as well.  We will try low-dose gabapentin 100 mg nightly.  We discussed potential side effects.  They will follow-up with us  in a few weeks via MyChart and we can adjust the dose as tolerated.

## 2023-12-27 NOTE — Assessment & Plan Note (Signed)
 Patient still with intermittent constipation and diarrhea.  She can continue taking MiraLAX .  We discussed importance of fiber supplementation.  Will start Metamucil 4 g daily.  We can increase as tolerated over the next several weeks.  Encouraged good hydration as well.

## 2023-12-27 NOTE — Assessment & Plan Note (Signed)
 She is following with urology for this.  Still having occasional issues with difficulty with voiding.  We did discuss having her follow-up with pelvic floor rehab however she would like to hold off on this for now.  Defer further management to urology.

## 2023-12-27 NOTE — Progress Notes (Signed)
   Linda [name] is a 89 y.o. female who presents today for an office visit.  Assessment/Plan:  Chronic Problems Addressed Today: Lumbar compression fracture [facility]) Had lengthy discussion with patient today and her daughter regarding her pain control.  She is currently on Tylenol  1000 mg 3 times daily and tramadol  50 mg twice daily.  Also using lidocaine  patches.  Unfortunately did not react well with Cymbalta .  We did discuss alternatives as well.  We will try low-dose gabapentin 100 mg nightly.  We discussed potential side effects.  They will follow-up with us  in a few weeks via MyChart and we can adjust the dose as tolerated.  Constipation Patient still with intermittent constipation and diarrhea.  She can continue taking MiraLAX .  We discussed importance of fiber supplementation.  Will start Metamucil 4 g daily.  We can increase as tolerated over the next several weeks.  Encouraged good hydration as well.  HTN (hypertension) At goal today with amlodipine  2.5 mg daily.  Urinary hesitancy She is following with urology for this.  Still having occasional issues with difficulty with voiding.  We did discuss having her follow-up with pelvic floor rehab however she would like to hold off on this for now.  Defer further management to urology.     Subjective:  HPI:  See A/P for status of chronic conditions.  Patient is here today for follow-up.  I last saw her about 3 months ago.  At her last office visit we tried low-dose Cymbalta  20 mg daily.  Unfortunately this caused significant issues with diarrhea.  She additionally has had issues with norovirus since her last visit as well.  They discontinued this a couple of months ago.  She is also still having ongoing issues with constipation.  We also discussed this at her most recent office visit.  Recommend she increase her MiraLAX  that 2 doses daily.  She has not had much benefit with this.  Still having ongoing issues with intermittent diarrhea as  well.  She has recovered from norovirus.       Objective:  Physical Exam: BP 126/60   Pulse 68   Temp 98 F (36.7 C) (Temporal)   Ht 5\' 4"  (1.626 m)   Wt 117 lb (53.1 kg)   SpO2 98%   BMI 20.08 kg/m   Gen: No acute distress, resting comfortably CV: Regular rate and rhythm with no murmurs appreciated Pulm: Normal work of breathing, clear to auscultation bilaterally with no crackles, wheezes, or rhonchi Neuro: Grossly normal, moves all extremities Psych: Normal affect and thought content      Aedin Jeansonne M. Daneil Dunker, MD 12/27/2023 1:57 PM

## 2023-12-27 NOTE — Assessment & Plan Note (Signed)
 At goal today with amlodipine  2.5 mg daily.

## 2023-12-28 ENCOUNTER — Other Ambulatory Visit: Payer: Self-pay | Admitting: *Deleted

## 2023-12-28 ENCOUNTER — Other Ambulatory Visit: Payer: Self-pay | Admitting: Family Medicine

## 2023-12-28 ENCOUNTER — Telehealth: Payer: Self-pay | Admitting: *Deleted

## 2023-12-28 MED ORDER — POLYETHYLENE GLYCOL 3350 17 GM/SCOOP PO POWD: ORAL | 3 refills | Status: AC

## 2023-12-28 NOTE — Telephone Encounter (Signed)
 Spoke to pt's daughter Bartholomew Light, told her received message from the pharmacy for refill for Miralax , but she is also on Goodsense Clearlax which is the same as Miralax . She is not taking both is she? Bartholomew Light said no she is only using one. Told her okay I am going to cancel Rx for Miralax  and keep Rx for Goodsense. Bartholomew Light said that is fine.

## 2023-12-28 NOTE — Telephone Encounter (Signed)
 Pt's daughter called Bartholomew Light about Rx's Clearlax and Miralax . She said pt is using Clearlax daily and also prn. She said two Rx's were sent in. Told her I cancelled the Miralax  due to Clearlax Rx. Told her I will send new Rx over with directions take daily and another dose prn. Bartholomew Light said yes, that is correct. I will send Rx. Bartholomew Light verbalized understanding.

## 2023-12-28 NOTE — Telephone Encounter (Signed)
 Called [facility] and spoke to [facility] told her returning Jack's call. She said you mean Jake, he is at lunch. Told her okay maybe you can help me. He said about changing Rx Clearlax to bottle due to expense. Told her I received a refill request this morning for Miralax  packets and I cancelled that due to pt is already on Clearlax powder not packets. Told her I spoke to pt's daughter and she said that she did not need refills. Misty said yes there is a Rx here for the Clearlax with refills. Told her okay. She said she will let Vaughn Georges know and if he has any questions he will call back. Told her okay.

## 2023-12-28 NOTE — Telephone Encounter (Signed)
 Copied from CRM [id number]. Topic: Clinical - Prescription Issue >> Dec 28, 2023 11:13 AM [place] J wrote: Reason for CRM: Marylou Sobers calling from [facility]. The packets of clearlax the patient is receiving is much for expensive than the bottle she is also receiving. He was wondering if we could discontinue the packets and send in a script for the bottle 2 times daily since that is what she is taking now.   Callback: [phone] Fax: [phone]

## 2023-12-31 ENCOUNTER — Encounter: Payer: Self-pay | Admitting: Family Medicine

## 2024-01-01 ENCOUNTER — Telehealth: Payer: Self-pay | Admitting: *Deleted

## 2024-01-01 ENCOUNTER — Other Ambulatory Visit: Payer: Self-pay | Admitting: *Deleted

## 2024-01-01 NOTE — Telephone Encounter (Signed)
 Copied from CRM [id number]. Topic: Clinical - Medication Question >> Dec 31, 2023  2:40 PM Adonis Hoot wrote: Reason for CRM: Jake form  [facility] called stating that prescription for gabapentin  (NEURONTIN ) 100 MG capsule wasn't signed by provider and he would like to make sure it is legit. He would like a verbal,or a new prescription with provider signature can be sent over.  Contact number:[phone]   [facility] Contact:  Jake Phone:  [phone] DC Rx Gabapentin 

## 2024-01-01 NOTE — Telephone Encounter (Signed)
 Called [facility] Contact:  Linda [name] Phone:  [phone] DC Rx Gabapentin 

## 2024-01-17 ENCOUNTER — Other Ambulatory Visit: Payer: Self-pay | Admitting: Family Medicine

## 2024-02-26 ENCOUNTER — Other Ambulatory Visit: Payer: Self-pay | Admitting: Family Medicine

## 2024-03-10 ENCOUNTER — Ambulatory Visit: Payer: Self-pay

## 2024-03-10 NOTE — Telephone Encounter (Signed)
 Appt today

## 2024-03-10 NOTE — Telephone Encounter (Signed)
 FYI Only or Action Required?: FYI only for provider.  Patient was last seen in primary care on 12/27/2023 by Kennyth Worth HERO, MD.  Called Nurse Triage reporting Leg Swelling.  Symptoms began a week ago.  Interventions attempted: Nothing.  Symptoms are: unchanged.  Triage Disposition: See Physician Within 24 Hours  Patient/caregiver understands and will follow disposition?: Yes                Copied from CRM [id number]. Topic: Clinical - Red Word Triage >> Mar 10, 2024  8:06 AM Antwanette L wrote: Red Word that prompted transfer to Nurse Triage: swelling in right leg Reason for Disposition  [1] MODERATE leg swelling (e.g., swelling extends up to knees) AND [2] new-onset or getting worse  Answer Assessment - Initial Assessment Questions 1. ONSET: When did the swelling start? (e.g., minutes, hours, days)     A week or two 2. LOCATION: What part of the leg is swollen?  Are both legs swollen or just one leg?     Right leg 3. SEVERITY: How bad is the swelling? (e.g., localized; mild, moderate, severe)     Mild-mod 4. REDNESS: Is there redness or signs of infection?     no 5. PAIN: Is the swelling painful to touch? If Yes, ask: How painful is it?   (Scale 1-10; mild, moderate or severe)     none 6. FEVER: Do you have a fever? If Yes, ask: What is it, how was it measured, and when did it start?      Dont think so 7. CAUSE: What do you think is causing the leg swelling?     unknown 8. MEDICAL HISTORY: Do you have a history of blood clots (e.g., DVT), cancer, heart failure, kidney disease, or liver failure?     no 9. RECURRENT SYMPTOM: Have you had leg swelling before? If Yes, ask: When was the last time? What happened that time?     Just ankle 10. OTHER SYMPTOMS: Do you have any other symptoms? (e.g., chest pain, difficulty breathing)       no  Protocols used: Leg Swelling and Edema-A-AH

## 2024-03-10 NOTE — Telephone Encounter (Signed)
 Appt tomorrow not today

## 2024-03-11 ENCOUNTER — Ambulatory Visit: Admitting: Family

## 2024-03-12 ENCOUNTER — Ambulatory Visit (INDEPENDENT_AMBULATORY_CARE_PROVIDER_SITE_OTHER): Admitting: Family

## 2024-03-12 ENCOUNTER — Encounter: Payer: Self-pay | Admitting: Family

## 2024-03-12 VITALS — BP 155/72 | HR 68 | Temp 97.9°F | Ht 64.0 in | Wt 119.6 lb

## 2024-03-12 DIAGNOSIS — Z0279 Encounter for issue of other medical certificate: Secondary | ICD-10-CM

## 2024-03-12 DIAGNOSIS — M2350 Chronic instability of knee, unspecified knee: Secondary | ICD-10-CM | POA: Diagnosis not present

## 2024-03-12 DIAGNOSIS — G8929 Other chronic pain: Secondary | ICD-10-CM | POA: Diagnosis not present

## 2024-03-12 DIAGNOSIS — M7989 Other specified soft tissue disorders: Secondary | ICD-10-CM | POA: Diagnosis not present

## 2024-03-12 DIAGNOSIS — M546 Pain in thoracic spine: Secondary | ICD-10-CM | POA: Diagnosis not present

## 2024-03-12 NOTE — Progress Notes (Signed)
 Patient ID: Linda [name], female    DOB: 06-Oct-1923, 89 y.o.   MRN: [id number]  Chief Complaint  Patient presents with   Leg Swelling    Pt c/o right leg swelling, Present for a couple of weeks.   Discussed the use of AI scribe software for clinical note transcription with the patient, who gave verbal consent to proceed.  History of Present Illness Linda [name] is a 89 year old female who presents with swelling in her right leg and knee instability.  Lower extremity edema - Swelling in the right leg extending to the ankle - Edema is more pronounced in the right leg compared to the left - No associated pain, tingling, numbness, erythema or heat in the lower leg - Takes amlodipine  twice daily, which can contribute to leg swelling  Knee instability - Occasional instability of the right knee - Uses a soft elastic knee brace for support during the day on both knees - Removes the knee brace at night - Elevates legs while sleeping  Hydration status - Attempts to drink up to two liters of water daily - Difficulty maintaining fluid intake due to frequent urination  Assessment & Plan Right leg swelling possibly multifactorial Right leg swelling possibly due to amlodipine  but more likely due to knee brace use as it is pinching into skin below the knee. Edema in improved in am with no knee brace overnight. No pain, tingling, numbness, or heat noted. - Elevate legs during the day, and try to pull knee brace down around ankles when resting, and pull up again when getting out of chair. - Continue to elevate legs during day and night. - Increase water intake to two liters daily. - Look for lighter compression sleeve for knee - Call office if symptoms are not improving.  Knee instability and lower extremity weakness Right knee instability with occasional giving out and states compression sleeve offers support to keep from buckling. Knee braces support but may impede circulation. - Remove or  lower knee braces when not walking. - Consider larger or less compressive knee brace.  Chronic back pain Chronic back pain more severe during the day. Lidocaine  patch helps at night but states pain is not back at night it is worse during day when up and moving. - Switch lidocaine  patch to daytime use, can request change in dose time with PCP to send to [facility]F. - Consider heating pad at night if needed with an automatic shut off timer.   Subjective:    Outpatient Medications Prior to Visit  Medication Sig Dispense Refill   amLODipine  (NORVASC ) 2.5 MG tablet Take 1 tablet (2.5 mg total) by mouth in the morning and at bedtime. 8 am and 8 pm 180 tablet 3   aspirin  EC 81 MG tablet TAKE (1) TABLET BY MOUTH ONCE DAILY. **DO NOT CRUSH** 30 tablet 11   bisacodyl (DULCOLAX) 10 MG suppository UNWRAP AND INSERT 1 (10MG ) SUPPOSITORY PER RECTUM EVERY 24 HOURS AS NEEDED FOR CONSTIPATION. 30 suppository 0   CALCIUM  ANTACID EXTRA STRENGTH 750 MG chewable tablet CHEW (1) TABLET BY MOUTH ONCE DAILY AS NEEDED FOR HEARTBURN. 30 tablet 0   clobetasol  cream (TEMOVATE ) 0.05 % APPLY TOPICALLY TO AFFECTED AREA(S) EVERY 12 HOURS AS NEEDED FOR DERMATITIS. 45 g 0   DHA-EPA-Flaxseed Oil-Vitamin E (THERA TEARS NUTRITION PO) Place 1 drop into both eyes in the morning, at noon, in the evening, and at bedtime. *wait 5 minutes between timolol  eye drops*     famotidine  (PEPCID ) 20  MG tablet Take 1 tablet (20 mg total) by mouth 2 (two) times daily. Patient needs follow up appointment for future refills. Please call [phone] to schedule an appointment. 180 tablet 0   lidocaine  (LIDODERM ) 5 % APPLY (1) PATCH TO LOWER BACK ONCE DAILY AT BEDTIME. LEAVE ON 12 HOURS. LEAVE OFF 12 HOURS.**MAY USE 4% PATCHES PROVIDED BY FAMILY** 30 patch 11   Metamucil Fiber 2 g CHEW Chew 4 g by mouth daily. 60 tablet 5   mirtazapine  (REMERON ) 30 MG tablet Take 30 mg by mouth at bedtime.     PAIN RELIEF EXTRA STRENGTH 500 MG tablet TAKE (2) TABLETS  (1,000MG ) BY MOUTH ONCE EVERY 24 HOURS IN THE EVENING AS NEEDED FOR PAIN. **NTE 4,000MG /24HR** 60 tablet 0   polyethylene glycol powder (GOODSENSE CLEARLAX) 17 GM/SCOOP powder MIX 17G (1 CAPFUL) INTO 8 OUNCES OF JUICE/WATER. DRINK ONCE DAILY AND MAY HAVE A SECOND DOSE DAILY AS NEEDED FOR CONSTIPATION. 510 g 3   QC VITAMIN D3 25 MCG (1000 UT) capsule TAKE (1) CAPSULE BY MOUTH TWICE DAILY. 60 capsule 11   rosuvastatin  (CRESTOR ) 20 MG tablet TAKE 1 TABLET DAILY 90 tablet 3   simethicone (INFANTS SIMETHICONE) 40 MG/0.6ML drops GIVE 0.6ML (40MG ) BY MOUTH TWICE DAILY AFTER LUNCH AND DINNER. 30 mL PRN   sotalol  (BETAPACE ) 80 MG tablet TAKE 1 TABLET EVERY 12 HOURS (Patient taking differently: Take 80 mg by mouth 2 (two) times daily.) 180 tablet 3   timolol  (TIMOPTIC ) 0.25 % ophthalmic solution Place 1 drop into both eyes daily.     traMADol  (ULTRAM ) 50 MG tablet TAKE (1) TABLET BY MOUTH TWICE DAILY. 60 tablet 4   triamcinolone  cream (KENALOG ) 0.1 % APPLY TOPICALLY TO SCALY PATCH ON LEFT SIDE OF NECK TWICE DAILY. (Patient not taking: Reported on 03/12/2024) 30 g 11   No facility-administered medications prior to visit.   Past Medical History:  Diagnosis Date   Arthritis    COPD (chronic obstructive pulmonary disease) (HCC)    chronic cough   Glaucoma    Hyperlipidemia    Hypertension    Irregular heartbeat    Osteoporosis    Thyroid  disease    enlarged thyroid    Past Surgical History:  Procedure Laterality Date   APPENDECTOMY     cateract surgery     COLONOSCOPY     Done in [place]  yrs ago   UPPER GASTROINTESTINAL ENDOSCOPY  2019   WRIST SURGERY     No Known Allergies    Objective:    Physical Exam Vitals and nursing note reviewed.  Constitutional:      Appearance: Normal appearance.  Cardiovascular:     Rate and Rhythm: Normal rate and regular rhythm.  Pulmonary:     Effort: Pulmonary effort is normal.     Breath sounds: Normal breath sounds.  Musculoskeletal:        General:  Normal range of motion.     Thoracic back: Tenderness (Kyphosis present) present.     Right knee: Swelling (mild) present. No bony tenderness. No tenderness.     Left knee: No swelling.     Right lower leg: Swelling present. No tenderness or bony tenderness. 2+ Edema present.     Left lower leg: No swelling.     Right ankle: Swelling (mild) present. No tenderness. Normal range of motion.  Skin:    General: Skin is warm and dry.  Neurological:     Mental Status: She is alert.     Sensory: Sensation is  intact.     Motor: Weakness present.     Gait: Gait abnormal (uses walker).  Psychiatric:        Mood and Affect: Mood normal.        Behavior: Behavior normal.    BP (!) 155/72   Pulse 68   Temp 97.9 F (36.6 C) (Temporal)   Ht 5' 4 (1.626 m)   Wt 119 lb 9.6 oz (54.3 kg)   SpO2 95%   BMI 20.53 kg/m  Wt Readings from Last 3 Encounters:  03/12/24 119 lb 9.6 oz (54.3 kg)  12/27/23 117 lb (53.1 kg)  12/05/23 117 lb (53.1 kg)      Lucius Krabbe, NP

## 2024-03-13 ENCOUNTER — Telehealth: Payer: Self-pay | Admitting: *Deleted

## 2024-03-13 NOTE — Telephone Encounter (Signed)
 Form adult care received  Placed in PCP office to be reviewed

## 2024-03-17 ENCOUNTER — Other Ambulatory Visit: Payer: Self-pay | Admitting: Family Medicine

## 2024-03-17 NOTE — Telephone Encounter (Signed)
 Form faxed to [phone] Placed to be scan in patient chart

## 2024-03-18 ENCOUNTER — Other Ambulatory Visit: Payer: Self-pay | Admitting: Family Medicine

## 2024-03-25 ENCOUNTER — Emergency Department (HOSPITAL_COMMUNITY)

## 2024-03-25 ENCOUNTER — Inpatient Hospital Stay (HOSPITAL_COMMUNITY)
Admission: EM | Admit: 2024-03-25 | Discharge: 2024-04-01 | DRG: 480 | Disposition: A | Discharge status: Skilled Nursing Facility | Source: Skilled Nursing Facility | Attending: Internal Medicine | Admitting: Internal Medicine

## 2024-03-25 DIAGNOSIS — G9341 Metabolic encephalopathy: Secondary | ICD-10-CM | POA: Diagnosis not present

## 2024-03-25 DIAGNOSIS — Z7982 Long term (current) use of aspirin: Secondary | ICD-10-CM

## 2024-03-25 DIAGNOSIS — D62 Acute posthemorrhagic anemia: Secondary | ICD-10-CM | POA: Diagnosis not present

## 2024-03-25 DIAGNOSIS — S72102G Unspecified trochanteric fracture of left femur, subsequent encounter for closed fracture with delayed healing: Secondary | ICD-10-CM | POA: Diagnosis not present

## 2024-03-25 DIAGNOSIS — W19XXXA Unspecified fall, initial encounter: Secondary | ICD-10-CM

## 2024-03-25 DIAGNOSIS — S72002A Fracture of unspecified part of neck of left femur, initial encounter for closed fracture: Secondary | ICD-10-CM | POA: Diagnosis not present

## 2024-03-25 DIAGNOSIS — Y92009 Unspecified place in unspecified non-institutional (private) residence as the place of occurrence of the external cause: Secondary | ICD-10-CM

## 2024-03-25 DIAGNOSIS — I63412 Cerebral infarction due to embolism of left middle cerebral artery: Secondary | ICD-10-CM | POA: Diagnosis not present

## 2024-03-25 DIAGNOSIS — W1830XA Fall on same level, unspecified, initial encounter: Secondary | ICD-10-CM | POA: Diagnosis present

## 2024-03-25 DIAGNOSIS — R1310 Dysphagia, unspecified: Secondary | ICD-10-CM | POA: Diagnosis not present

## 2024-03-25 DIAGNOSIS — I639 Cerebral infarction, unspecified: Secondary | ICD-10-CM

## 2024-03-25 DIAGNOSIS — R319 Hematuria, unspecified: Secondary | ICD-10-CM | POA: Diagnosis not present

## 2024-03-25 DIAGNOSIS — I4719 Other supraventricular tachycardia: Secondary | ICD-10-CM | POA: Diagnosis present

## 2024-03-25 DIAGNOSIS — Z87891 Personal history of nicotine dependence: Secondary | ICD-10-CM

## 2024-03-25 DIAGNOSIS — R339 Retention of urine, unspecified: Secondary | ICD-10-CM | POA: Diagnosis present

## 2024-03-25 DIAGNOSIS — S72102A Unspecified trochanteric fracture of left femur, initial encounter for closed fracture: Secondary | ICD-10-CM | POA: Diagnosis not present

## 2024-03-25 DIAGNOSIS — E7800 Pure hypercholesterolemia, unspecified: Secondary | ICD-10-CM | POA: Diagnosis present

## 2024-03-25 DIAGNOSIS — Z823 Family history of stroke: Secondary | ICD-10-CM | POA: Diagnosis not present

## 2024-03-25 DIAGNOSIS — R64 Cachexia: Secondary | ICD-10-CM | POA: Diagnosis present

## 2024-03-25 DIAGNOSIS — Z8679 Personal history of other diseases of the circulatory system: Secondary | ICD-10-CM

## 2024-03-25 DIAGNOSIS — Z8249 Family history of ischemic heart disease and other diseases of the circulatory system: Secondary | ICD-10-CM | POA: Diagnosis not present

## 2024-03-25 DIAGNOSIS — J449 Chronic obstructive pulmonary disease, unspecified: Secondary | ICD-10-CM | POA: Diagnosis present

## 2024-03-25 DIAGNOSIS — I6389 Other cerebral infarction: Secondary | ICD-10-CM | POA: Diagnosis not present

## 2024-03-25 DIAGNOSIS — Y92099 Unspecified place in other non-institutional residence as the place of occurrence of the external cause: Secondary | ICD-10-CM | POA: Diagnosis not present

## 2024-03-25 DIAGNOSIS — Z515 Encounter for palliative care: Secondary | ICD-10-CM | POA: Diagnosis not present

## 2024-03-25 DIAGNOSIS — E782 Mixed hyperlipidemia: Secondary | ICD-10-CM

## 2024-03-25 DIAGNOSIS — M545 Low back pain, unspecified: Secondary | ICD-10-CM | POA: Diagnosis not present

## 2024-03-25 DIAGNOSIS — I10 Essential (primary) hypertension: Secondary | ICD-10-CM | POA: Diagnosis present

## 2024-03-25 DIAGNOSIS — H409 Unspecified glaucoma: Secondary | ICD-10-CM | POA: Diagnosis present

## 2024-03-25 DIAGNOSIS — M25552 Pain in left hip: Secondary | ICD-10-CM | POA: Diagnosis not present

## 2024-03-25 DIAGNOSIS — S72142A Displaced intertrochanteric fracture of left femur, initial encounter for closed fracture: Principal | ICD-10-CM | POA: Diagnosis present

## 2024-03-25 DIAGNOSIS — R29724 NIHSS score 24: Secondary | ICD-10-CM | POA: Diagnosis not present

## 2024-03-25 DIAGNOSIS — F32A Depression, unspecified: Secondary | ICD-10-CM | POA: Diagnosis present

## 2024-03-25 DIAGNOSIS — J029 Acute pharyngitis, unspecified: Secondary | ICD-10-CM | POA: Diagnosis not present

## 2024-03-25 DIAGNOSIS — F419 Anxiety disorder, unspecified: Secondary | ICD-10-CM | POA: Diagnosis present

## 2024-03-25 DIAGNOSIS — Z79899 Other long term (current) drug therapy: Secondary | ICD-10-CM | POA: Diagnosis not present

## 2024-03-25 DIAGNOSIS — E785 Hyperlipidemia, unspecified: Secondary | ICD-10-CM | POA: Diagnosis present

## 2024-03-25 DIAGNOSIS — E538 Deficiency of other specified B group vitamins: Secondary | ICD-10-CM | POA: Diagnosis present

## 2024-03-25 DIAGNOSIS — Z6821 Body mass index (BMI) 21.0-21.9, adult: Secondary | ICD-10-CM

## 2024-03-25 DIAGNOSIS — Z66 Do not resuscitate: Secondary | ICD-10-CM | POA: Diagnosis present

## 2024-03-25 DIAGNOSIS — Z8673 Personal history of transient ischemic attack (TIA), and cerebral infarction without residual deficits: Secondary | ICD-10-CM

## 2024-03-25 DIAGNOSIS — I69391 Dysphagia following cerebral infarction: Secondary | ICD-10-CM | POA: Diagnosis not present

## 2024-03-25 DIAGNOSIS — Z8709 Personal history of other diseases of the respiratory system: Secondary | ICD-10-CM

## 2024-03-25 DIAGNOSIS — Z711 Person with feared health complaint in whom no diagnosis is made: Secondary | ICD-10-CM | POA: Diagnosis not present

## 2024-03-25 DIAGNOSIS — I6340 Cerebral infarction due to embolism of unspecified cerebral artery: Secondary | ICD-10-CM | POA: Diagnosis not present

## 2024-03-25 DIAGNOSIS — Z7189 Other specified counseling: Secondary | ICD-10-CM | POA: Diagnosis not present

## 2024-03-25 DIAGNOSIS — S72002K Fracture of unspecified part of neck of left femur, subsequent encounter for closed fracture with nonunion: Secondary | ICD-10-CM | POA: Diagnosis not present

## 2024-03-25 DIAGNOSIS — I63422 Cerebral infarction due to embolism of left anterior cerebral artery: Secondary | ICD-10-CM | POA: Diagnosis not present

## 2024-03-25 DIAGNOSIS — S72102K Unspecified trochanteric fracture of left femur, subsequent encounter for closed fracture with nonunion: Secondary | ICD-10-CM | POA: Diagnosis not present

## 2024-03-25 DIAGNOSIS — R54 Age-related physical debility: Secondary | ICD-10-CM | POA: Diagnosis present

## 2024-03-25 HISTORY — DX: Unspecified osteoarthritis, unspecified site: M19.90

## 2024-03-25 HISTORY — DX: Cerebral infarction, unspecified: I63.9

## 2024-03-25 LAB — CBC WITH DIFFERENTIAL/PLATELET
Abs Immature Granulocytes: 0.03 K/uL (ref 0.00–0.07)
Basophils Absolute: 0 K/uL (ref 0.0–0.1)
Basophils Relative: 1 %
Eosinophils Absolute: 0.2 K/uL (ref 0.0–0.5)
Eosinophils Relative: 3 %
HCT: 37.2 % (ref 36.0–46.0)
Hemoglobin: 11.8 g/dL — ABNORMAL LOW (ref 12.0–15.0)
Immature Granulocytes: 0 %
Lymphocytes Relative: 19 %
Lymphs Abs: 1.6 K/uL (ref 0.7–4.0)
MCH: 31.5 pg (ref 26.0–34.0)
MCHC: 31.7 g/dL (ref 30.0–36.0)
MCV: 99.2 fL (ref 80.0–100.0)
Monocytes Absolute: 0.6 K/uL (ref 0.1–1.0)
Monocytes Relative: 7 %
Neutro Abs: 5.7 K/uL (ref 1.7–7.7)
Neutrophils Relative %: 70 %
Platelets: 217 K/uL (ref 150–400)
RBC: 3.75 MIL/uL — ABNORMAL LOW (ref 3.87–5.11)
RDW: 12.3 % (ref 11.5–15.5)
WBC: 8.2 K/uL (ref 4.0–10.5)
nRBC: 0 % (ref 0.0–0.2)

## 2024-03-25 LAB — COMPREHENSIVE METABOLIC PANEL WITH GFR
ALT: 11 U/L (ref 0–44)
AST: 19 U/L (ref 15–41)
Albumin: 3.3 g/dL — ABNORMAL LOW (ref 3.5–5.0)
Alkaline Phosphatase: 97 U/L (ref 38–126)
Anion gap: 9 (ref 5–15)
BUN: 10 mg/dL (ref 8–23)
CO2: 22 mmol/L (ref 22–32)
Calcium: 9.6 mg/dL (ref 8.9–10.3)
Chloride: 104 mmol/L (ref 98–111)
Creatinine, Ser: 0.64 mg/dL (ref 0.44–1.00)
GFR, Estimated: >60 mL/min (ref >60)
Glucose, Bld: 133 mg/dL — ABNORMAL HIGH (ref 70–99)
Potassium: 3.9 mmol/L (ref 3.5–5.1)
Sodium: 135 mmol/L (ref 135–145)
Total Bilirubin: 0.3 mg/dL (ref 0.0–1.2)
Total Protein: 6.2 g/dL — ABNORMAL LOW (ref 6.5–8.1)

## 2024-03-25 LAB — MAGNESIUM: Magnesium: 2 mg/dL (ref 1.7–2.4)

## 2024-03-25 LAB — ABO/RH: ABO/RH(D): O POS

## 2024-03-25 MED ORDER — NALOXONE HCL 0.4 MG/ML IJ SOLN: 0.4000 mg | INTRAMUSCULAR | Status: DC | PRN

## 2024-03-25 MED ORDER — ACETAMINOPHEN 325 MG PO TABS: 650.0000 mg | ORAL_TABLET | Freq: Four times a day (QID) | ORAL | Status: DC | PRN

## 2024-03-25 MED ORDER — ONDANSETRON HCL 4 MG/2ML IJ SOLN
4.0000 mg | Freq: Four times a day (QID) | INTRAMUSCULAR | Status: DC | PRN
  Administered 2024-03-25 (×2): 4 mg via INTRAVENOUS
  Filled 2024-03-25: qty 2

## 2024-03-25 MED ORDER — ACETAMINOPHEN 650 MG RE SUPP
650.0000 mg | Freq: Four times a day (QID) | RECTAL | Status: DC | PRN
Start: 2024-03-25 — End: 2024-03-26

## 2024-03-25 MED ORDER — FENTANYL CITRATE PF 50 MCG/ML IJ SOSY
50.0000 ug | PREFILLED_SYRINGE | Freq: Once | INTRAMUSCULAR | Status: AC
  Administered 2024-03-25 (×2): 50 ug via INTRAVENOUS
  Filled 2024-03-25: qty 1

## 2024-03-25 MED ORDER — MELATONIN 3 MG PO TABS
3.0000 mg | ORAL_TABLET | Freq: Every evening | ORAL | Status: DC | PRN
  Administered 2024-03-27: 3 mg via ORAL
  Filled 2024-03-25: qty 1

## 2024-03-25 MED ORDER — FENTANYL CITRATE PF 50 MCG/ML IJ SOSY
25.0000 ug | PREFILLED_SYRINGE | INTRAMUSCULAR | Status: DC | PRN
  Administered 2024-03-25 – 2024-03-26 (×8): 25 ug via INTRAVENOUS
  Filled 2024-03-25 (×4): qty 1

## 2024-03-25 NOTE — ED Triage Notes (Addendum)
 Pt BIB [facility] from [place]  [place] [facility] for a fall.  Pt found sitting/lying on floor lying on left hip.  Unable to move left leg right now.  Good pulse in left foot.  Mild shortening and rotation.  A&O but HOH.   Pt states she was going to bathroom and tripped and fall.   2 doses of 25mcg of Fentanyl  and 4mg  of zofran . Pain has not resolved.  HTN.  No thinners.   180/64 93% RA after fentanyl , P: 68 RR 18

## 2024-03-25 NOTE — ED Provider Notes (Addendum)
  EMERGENCY DEPARTMENT AT [facility] Provider Note   CSN: [id number] Arrival date & time: 03/25/24  1711     Patient presents with: No chief complaint on file.   Linda [name] is a 89 y.o. female.   Patient here after mechanical fall at home.  Pain in the left hip.  She did not hit her head or lose consciousness.  Is not on any blood thinners.  She typically ambulates with a walker.  She lives in independent living.  History of hypertension high cholesterol COPD.  Denies any pain elsewhere.  No neck pain.  No loss of bowel or bladder.  No back pain.  The history is provided by the patient and a caregiver.       Prior to Admission medications   Medication Sig Start Date End Date Taking? Authorizing Provider  amLODipine  (NORVASC ) 2.5 MG tablet TAKE (1) TABLET BY MOUTH TWICE DAILY. 03/17/24   Kennyth Worth HERO, MD  aspirin  EC 81 MG tablet TAKE (1) TABLET BY MOUTH ONCE DAILY. **DO NOT CRUSH** 07/30/23   Kennyth Worth HERO, MD  bisacodyl  (DULCOLAX) 10 MG suppository UNWRAP AND INSERT 1 (10MG ) SUPPOSITORY PER RECTUM EVERY 24 HOURS AS NEEDED FOR CONSTIPATION. 02/26/24   Kennyth Worth HERO, MD  CALCIUM  ANTACID EXTRA STRENGTH 750 MG chewable tablet CHEW (1) TABLET BY MOUTH ONCE DAILY AS NEEDED FOR HEARTBURN. 11/15/23   Kennyth Worth HERO, MD  clobetasol  cream (TEMOVATE ) 0.05 % APPLY TOPICALLY TO AFFECTED AREA(S) EVERY 12 HOURS AS NEEDED FOR DERMATITIS. 02/26/24   Parker, Caleb M, MD  DHA-EPA-Flaxseed Oil-Vitamin E (THERA TEARS NUTRITION PO) Place 1 drop into both eyes in the morning, at noon, in the evening, and at bedtime. *wait 5 minutes between timolol  eye drops*    [provider]  famotidine  (PEPCID ) 20 MG tablet Take 1 tablet (20 mg total) by mouth 2 (two) times daily. Patient needs follow up appointment for future refills. Please call [phone] to schedule an appointment. 01/03/23   Esterwood, Amy S, PA-C  lidocaine  (LIDODERM ) 5 % APPLY (1) PATCH TO LOWER BACK ONCE DAILY AT  BEDTIME. LEAVE ON 12 HOURS. LEAVE OFF 12 HOURS. 03/18/24   Kennyth Worth HERO, MD  Metamucil Fiber 2 g CHEW Chew 4 g by mouth daily. 12/27/23   Kennyth Worth HERO, MD  mirtazapine  (REMERON ) 30 MG tablet Take 30 mg by mouth at bedtime. 03/24/22   [provider]  PAIN RELIEF EXTRA STRENGTH 500 MG tablet TAKE (2) TABLETS (1,000MG ) BY MOUTH ONCE EVERY 24 HOURS IN THE EVENING AS NEEDED FOR PAIN. **NTE 4,000MG /24HR** 02/26/24   Parker, Worth HERO, MD  polyethylene glycol powder (GOODSENSE CLEARLAX) 17 GM/SCOOP powder MIX 17G (1 CAPFUL) INTO 8 OUNCES OF JUICE/WATER. DRINK ONCE DAILY AND MAY HAVE A SECOND DOSE DAILY AS NEEDED FOR CONSTIPATION. 12/28/23   Kennyth Worth HERO, MD  QC VITAMIN D3 25 MCG (1000 UT) capsule TAKE (1) CAPSULE BY MOUTH TWICE DAILY. 08/29/23   Kennyth Worth HERO, MD  rosuvastatin  (CRESTOR ) 20 MG tablet TAKE 1 TABLET DAILY 03/13/22   Kennyth Worth HERO, MD  simethicone  (INFANTS SIMETHICONE ) 40 MG/0.6ML drops GIVE 0.6ML (40MG ) BY MOUTH TWICE DAILY AFTER LUNCH AND DINNER. 03/13/23   Kennyth Worth HERO, MD  sotalol  (BETAPACE ) 80 MG tablet TAKE 1 TABLET EVERY 12 HOURS Patient taking differently: Take 80 mg by mouth 2 (two) times daily. 06/13/22   Kennyth Worth HERO, MD  timolol  (TIMOPTIC ) 0.25 % ophthalmic solution Place 1 drop into both eyes daily. 02/22/19  [provider]  traMADol  (ULTRAM ) 50 MG tablet TAKE (1) TABLET BY MOUTH TWICE DAILY. 01/18/24   Kennyth Worth HERO, MD  triamcinolone  cream (KENALOG ) 0.1 % APPLY TOPICALLY TO SCALY PATCH ON LEFT SIDE OF NECK TWICE DAILY. Patient not taking: Reported on 03/12/2024 02/27/23   Kennyth Worth HERO, MD    Allergies: Patient has no known allergies.    Review of Systems  Updated Vital Signs BP (!) 167/49   Pulse (!) 58   Temp (!) 97.4 F (36.3 C)   Resp 16   SpO2 100%   Physical Exam Vitals and nursing note reviewed.  Constitutional:      General: She is not in acute distress.    Appearance: She is well-developed. She is not ill-appearing.  HENT:      Head: Normocephalic and atraumatic.     Mouth/Throat:     Mouth: Mucous membranes are moist.  Eyes:     Extraocular Movements: Extraocular movements intact.     Conjunctiva/sclera: Conjunctivae normal.     Pupils: Pupils are equal, round, and reactive to light.  Neck:     Comments: No midline cervical tenderness Cardiovascular:     Rate and Rhythm: Normal rate and regular rhythm.     Pulses: Normal pulses.     Heart sounds: Normal heart sounds. No murmur heard. Pulmonary:     Effort: Pulmonary effort is normal. No respiratory distress.     Breath sounds: Normal breath sounds.  Abdominal:     Palpations: Abdomen is soft.     Tenderness: There is no abdominal tenderness.  Musculoskeletal:        General: Tenderness present. No swelling.     Cervical back: Normal range of motion and neck supple. No tenderness.     Comments: Tenderness to the left hip which is shortened and rotated  Skin:    General: Skin is warm and dry.     Capillary Refill: Capillary refill takes less than 2 seconds.  Neurological:     General: No focal deficit present.     Mental Status: She is alert and oriented to person, place, and time.     Cranial Nerves: No cranial nerve deficit.     Sensory: No sensory deficit.     Motor: No weakness.     Comments: Normal strength and sensation throughout  Psychiatric:        Mood and Affect: Mood normal.     (all labs ordered are listed, but only abnormal results are displayed) Labs Reviewed  CBC WITH DIFFERENTIAL/PLATELET - Abnormal; Notable for the following components:      Result Value   RBC 3.75 (*)    Hemoglobin 11.8 (*)    All other components within normal limits  COMPREHENSIVE METABOLIC PANEL WITH GFR - Abnormal; Notable for the following components:   Glucose, Bld 133 (*)    Total Protein 6.2 (*)    Albumin  3.3 (*)    All other components within normal limits    EKG: None  Radiology: CT Cervical Spine Wo Contrast Result Date:  03/25/2024 CLINICAL DATA:  Polytrauma, blunt.  Fall EXAM: CT HEAD WITHOUT CONTRAST CT CERVICAL SPINE WITHOUT CONTRAST TECHNIQUE: Multidetector CT imaging of the head and cervical spine was performed following the standard protocol without intravenous contrast. Multiplanar CT image reconstructions of the cervical spine were also generated. RADIATION DOSE REDUCTION: This exam was performed according to the departmental dose-optimization program which includes automated exposure control, adjustment of the mA and/or kV according  to patient size and/or use of iterative reconstruction technique. COMPARISON:  None Available. FINDINGS: CT HEAD FINDINGS Brain: Cerebral ventricle sizes are concordant with the degree of cerebral volume loss. Patchy and confluent areas of decreased attenuation are noted throughout the deep and periventricular white matter of the cerebral hemispheres bilaterally, compatible with chronic microvascular ischemic disease. No evidence of large-territorial acute infarction. No parenchymal hemorrhage. No mass lesion. No extra-axial collection. No mass effect or midline shift. No hydrocephalus. Basilar cisterns are patent. Vascular: No hyperdense vessel. Atherosclerotic calcifications are present within the cavernous internal carotid and vertebral arteries. Skull: No acute fracture or focal lesion. Sinuses/Orbits: Paranasal sinuses and mastoid air cells are clear. Bilateral lens replacement. Otherwise the orbits are unremarkable. Other: None. CT CERVICAL SPINE FINDINGS Alignment: Grade 1 anterolisthesis of C5 on C6. Skull base and vertebrae: Multilevel moderate degenerative change of the spine. No acute fracture. No aggressive appearing focal osseous lesion or focal pathologic process. Soft tissues and spinal canal: No prevertebral fluid or swelling. No visible canal hematoma. Upper chest: Biapical pleural/pulmonary scarring. Paraseptal emphysematous changes. Other: Heterogeneous enlarged thyroid  gland.  Atherosclerotic plaque of the carotid arteries. IMPRESSION: 1. No acute intracranial abnormality. 2. No acute displaced fracture or traumatic listhesis of the cervical spine. 3.  Aortic aneurysm NOS (ICD10-I71.9). 4. Multinodular goiter. In the setting of significant comorbidities or limited life expectancy, no follow-up recommended (ref: J Am Coll Radiol. 2015 Feb;12(2): 143-50). Electronically Signed   By: Morgane  Naveau M.D.   On: 03/25/2024 18:40   CT Head Wo Contrast Result Date: 03/25/2024 CLINICAL DATA:  Polytrauma, blunt.  Fall EXAM: CT HEAD WITHOUT CONTRAST CT CERVICAL SPINE WITHOUT CONTRAST TECHNIQUE: Multidetector CT imaging of the head and cervical spine was performed following the standard protocol without intravenous contrast. Multiplanar CT image reconstructions of the cervical spine were also generated. RADIATION DOSE REDUCTION: This exam was performed according to the departmental dose-optimization program which includes automated exposure control, adjustment of the mA and/or kV according to patient size and/or use of iterative reconstruction technique. COMPARISON:  None Available. FINDINGS: CT HEAD FINDINGS Brain: Cerebral ventricle sizes are concordant with the degree of cerebral volume loss. Patchy and confluent areas of decreased attenuation are noted throughout the deep and periventricular white matter of the cerebral hemispheres bilaterally, compatible with chronic microvascular ischemic disease. No evidence of large-territorial acute infarction. No parenchymal hemorrhage. No mass lesion. No extra-axial collection. No mass effect or midline shift. No hydrocephalus. Basilar cisterns are patent. Vascular: No hyperdense vessel. Atherosclerotic calcifications are present within the cavernous internal carotid and vertebral arteries. Skull: No acute fracture or focal lesion. Sinuses/Orbits: Paranasal sinuses and mastoid air cells are clear. Bilateral lens replacement. Otherwise the orbits are  unremarkable. Other: None. CT CERVICAL SPINE FINDINGS Alignment: Grade 1 anterolisthesis of C5 on C6. Skull base and vertebrae: Multilevel moderate degenerative change of the spine. No acute fracture. No aggressive appearing focal osseous lesion or focal pathologic process. Soft tissues and spinal canal: No prevertebral fluid or swelling. No visible canal hematoma. Upper chest: Biapical pleural/pulmonary scarring. Paraseptal emphysematous changes. Other: Heterogeneous enlarged thyroid  gland. Atherosclerotic plaque of the carotid arteries. IMPRESSION: 1. No acute intracranial abnormality. 2. No acute displaced fracture or traumatic listhesis of the cervical spine. 3.  Aortic aneurysm NOS (ICD10-I71.9). 4. Multinodular goiter. In the setting of significant comorbidities or limited life expectancy, no follow-up recommended (ref: J Am Coll Radiol. 2015 Feb;12(2): 143-50). Electronically Signed   By: Morgane  Naveau M.D.   On: 03/25/2024 18:40   DG  Hip Unilat With Pelvis 2-3 Views Left Result Date: 03/25/2024 CLINICAL DATA:  pain EXAM: DG HIP (WITH OR WITHOUT PELVIS) 2-3V LEFT COMPARISON:  None Available. FINDINGS: Acute displaced and comminuted left intertrochanteric femoral fracture. No acute displaced fracture or dislocation of the right hip. No acute displaced fracture or diastasis of the bones of the pelvis. Coarsely calcified pericentimeter lesion along the pelvis may represent a fibroid. there is no evidence of arthropathy or other focal bone abnormality. IMPRESSION: Acute displaced and comminuted left intertrochanteric femoral fracture. Electronically Signed   By: Morgane  Naveau M.D.   On: 03/25/2024 18:32     Procedures   Medications Ordered in the ED  fentaNYL  (SUBLIMAZE ) injection 50 mcg (50 mcg Intravenous Given 03/25/24 1738)                                    Medical Decision Making Amount and/or Complexity of Data Reviewed Labs: ordered. Radiology: ordered.  Risk Prescription drug  management. Decision regarding hospitalization.   Shavaun Medinger is here with left hip pain after fall.  History of hypertension high cholesterol COPD.  Mechanical fall.  Differential diagnoses likely hip fracture versus less likely contusion.  Will get a CT scan of her head and neck basic labs and x-ray of her left hip.  Patient given IV fentanyl  for pain.  She is neurovascular neuromuscular intact.  Per my review and interpretation labs no significant leukocytosis anemia or electrolyte abnormality.  X-ray of the left hip per my review interpretation does show a left hip fracture.  CT of the head and neck is unremarkable per radiology report.  I have talked with Dr. Georgina with orthopedics.  N.p.o. at midnight.  Will admit to hospitalist for further care.  This chart was dictated using voice recognition software.  Despite best efforts to proofread,  errors can occur which can change the documentation meaning.       Final diagnoses:  Closed fracture of left hip, initial encounter [facility])    ED Discharge Orders     None          Ruthe Cornet, DO 03/25/24 1932    Ruthe Cornet, DO 03/25/24 1932

## 2024-03-25 NOTE — ED Notes (Signed)
 Pt and family stated she has not used the bathroom since around 1600 when she fell and she thinks she needs to use it. Pt stated she can not use the Purewick and her stomach is starting to get distended and painful. Bladder scanned and had <500 mL. RN notified.

## 2024-03-25 NOTE — H&P (Signed)
 History and Physical      Linda [name] FMW:[id number] DOB: 05-29-1924 DOA: 03/25/2024; DOS: 03/25/2024  PCP: Kennyth Worth HERO, MD  Patient coming from: home   I have personally briefly reviewed patient's old medical records in [facility]  Chief Complaint: fall  HPI: Linda [name] is a 89 y.o. female with medical history significant for COPD, essential hypertension, hyperlipidemia, who is admitted to [facility] on 03/25/2024 with acute left intertrochanteric femur fracture after ground-level mechanical fall at assisted living after presenting from home to [facility] ED complaining of fall.   Following history is provided by the patient as well as the patient's daughter, who is present at bedside, in addition to my discussions with the EDP and via chart review.  Patient reports tripping while attempting to ambulate at ALF wit baseline walkerh her , resulting in a ground level fall during which left hip was the principal point of contact with the floor below. As a result of this fall, the patient reports immediate development of sharp left hip pain, with radiation into the left groin. States that this pain has been constant since onset, with exacerbation when attempting to move the left lower extremity.  As a consequence of the associated intensity of this discomfort, reports that he is unable to bear weight on the left lower extremity at this time.  This is relative to baseline functional status in which the patient lives at ALF and ambulates with walker.   Otherwise, denies any acute arthralgias or myalgias as a result of the above fall.  Denies any acute numbness or paresthesias in bilateral lower extremities, and confirms that left hip representations a native joint.   Did not hit head as a component of this fall, and denies any associated loss of consciousness.  Denies any preceding or associated chest pain, shortness of breath, diaphoresis, palpitations, nausea, vomiting, dizziness,  presyncope, or syncope.  Denies any subsequent headache, neck pain, blurry vision, or diplopia.  Is on daily baby aspirin  as an outpatient, but otherwise on no blood thinners.  denies any recent orthopnea, PND, or peripheral edema.       ED Course:  Vital signs in the ED were notable for the following: Afebrile; heart rates in the 60s to 70s; oxygen saturation 95 to 100% on room air.  Labs were notable for the following: CMP notable for creatinine 0.64.  CBC notable for white cell count 8200.  Per my interpretation, EKG in ED demonstrated the following: Sinus rhythm with heart rate 57, no evidence of T wave or ST changes, including no evidence of ST elevation.  Imaging in the ED, per corresponding formal radiology read, was notable for the following: CT head showed no evidence of acute intracranial process, Cleen evidence of intra hemorrhage.  CT cervical spine showed evidence of acute cervical spine fracture subluxation injury.  Plain films of the left hip showed acute displaced and commuted left intertrochanteric femoral fracture.  EDP discussed with on-call [facility], Dr. Georgina, who conveyed that [facility] will consult, see pt in the AM, and anticipate taking pt to surgery.   While in the ED, the following were administered: Fentanyl  50 mcg IV x 1 dose.  Subsequently, the patient was admitted for further evaluation management of presenting acute left intertrochanteric femur fracture resulting from, mechanical fall.    Review of Systems: As per HPI otherwise 10 point review of systems negative.   Past Medical History:  Diagnosis Date   Arthritis    COPD (chronic obstructive  pulmonary disease) (HCC)    chronic cough   Glaucoma    Hyperlipidemia    Hypertension    Irregular heartbeat    Osteoporosis    Thyroid  disease    enlarged thyroid     Past Surgical History:  Procedure Laterality Date   APPENDECTOMY     cateract surgery     COLONOSCOPY     Done in [place]  yrs ago    UPPER GASTROINTESTINAL ENDOSCOPY  2019   WRIST SURGERY      Social History:  reports that she quit smoking about 73 years ago. Her smoking use included cigarettes. She started smoking about 75 years ago. She has a 0.5 pack-year smoking history. She has been exposed to tobacco smoke. She has never used smokeless tobacco. She reports that she does not currently use alcohol . She reports that she does not use drugs.   No Known Allergies  Family History  Problem Relation Age of Onset   Stroke Mother    Heart disease Mother    Heart disease Father    Heart disease Brother    Heart disease Brother    Colon cancer Neg Hx    Colon polyps Neg Hx    Esophageal cancer Neg Hx     Family history reviewed and not pertinent    Prior to Admission medications   Medication Sig Start Date End Date Taking? Authorizing Provider  amLODipine  (NORVASC ) 2.5 MG tablet TAKE (1) TABLET BY MOUTH TWICE DAILY. 03/17/24   Kennyth Worth HERO, MD  aspirin  EC 81 MG tablet TAKE (1) TABLET BY MOUTH ONCE DAILY. **DO NOT CRUSH** 07/30/23   Kennyth Worth HERO, MD  bisacodyl  (DULCOLAX) 10 MG suppository UNWRAP AND INSERT 1 (10MG ) SUPPOSITORY PER RECTUM EVERY 24 HOURS AS NEEDED FOR CONSTIPATION. 02/26/24   Kennyth Worth HERO, MD  CALCIUM  ANTACID EXTRA STRENGTH 750 MG chewable tablet CHEW (1) TABLET BY MOUTH ONCE DAILY AS NEEDED FOR HEARTBURN. 11/15/23   Kennyth Worth HERO, MD  clobetasol  cream (TEMOVATE ) 0.05 % APPLY TOPICALLY TO AFFECTED AREA(S) EVERY 12 HOURS AS NEEDED FOR DERMATITIS. 02/26/24   Parker, Caleb M, MD  DHA-EPA-Flaxseed Oil-Vitamin E (THERA TEARS NUTRITION PO) Place 1 drop into both eyes in the morning, at noon, in the evening, and at bedtime. *wait 5 minutes between timolol  eye drops*    [provider]  famotidine  (PEPCID ) 20 MG tablet Take 1 tablet (20 mg total) by mouth 2 (two) times daily. Patient needs follow up appointment for future refills. Please call [phone] to schedule an appointment. 01/03/23    Esterwood, Amy S, PA-C  lidocaine  (LIDODERM ) 5 % APPLY (1) PATCH TO LOWER BACK ONCE DAILY AT BEDTIME. LEAVE ON 12 HOURS. LEAVE OFF 12 HOURS. 03/18/24   Kennyth Worth HERO, MD  Metamucil Fiber 2 g CHEW Chew 4 g by mouth daily. 12/27/23   Kennyth Worth HERO, MD  mirtazapine  (REMERON ) 30 MG tablet Take 30 mg by mouth at bedtime. 03/24/22   [provider]  PAIN RELIEF EXTRA STRENGTH 500 MG tablet TAKE (2) TABLETS (1,000MG ) BY MOUTH ONCE EVERY 24 HOURS IN THE EVENING AS NEEDED FOR PAIN. **NTE 4,000MG /24HR** 02/26/24   Parker, Worth HERO, MD  polyethylene glycol powder (GOODSENSE CLEARLAX) 17 GM/SCOOP powder MIX 17G (1 CAPFUL) INTO 8 OUNCES OF JUICE/WATER. DRINK ONCE DAILY AND MAY HAVE A SECOND DOSE DAILY AS NEEDED FOR CONSTIPATION. 12/28/23   Kennyth Worth HERO, MD  QC VITAMIN D3 25 MCG (1000 UT) capsule TAKE (1) CAPSULE BY MOUTH TWICE DAILY.  08/29/23   Kennyth Worth HERO, MD  rosuvastatin  (CRESTOR ) 20 MG tablet TAKE 1 TABLET DAILY 03/13/22   Kennyth Worth HERO, MD  simethicone  (INFANTS SIMETHICONE ) 40 MG/0.6ML drops GIVE 0.6ML (40MG ) BY MOUTH TWICE DAILY AFTER LUNCH AND DINNER. 03/13/23   Kennyth Worth HERO, MD  sotalol  (BETAPACE ) 80 MG tablet TAKE 1 TABLET EVERY 12 HOURS Patient taking differently: Take 80 mg by mouth 2 (two) times daily. 06/13/22   Kennyth Worth HERO, MD  timolol  (TIMOPTIC ) 0.25 % ophthalmic solution Place 1 drop into both eyes daily. 02/22/19   [provider]  traMADol  (ULTRAM ) 50 MG tablet TAKE (1) TABLET BY MOUTH TWICE DAILY. 01/18/24   Kennyth Worth HERO, MD  triamcinolone  cream (KENALOG ) 0.1 % APPLY TOPICALLY TO SCALY PATCH ON LEFT SIDE OF NECK TWICE DAILY. Patient not taking: Reported on 03/12/2024 02/27/23   Kennyth Worth HERO, MD     Objective    Physical Exam: Vitals:   03/25/24 1815 03/25/24 1830 03/25/24 1845 03/25/24 1958  BP: (!) 144/92 (!) 154/62 (!) 167/49   Pulse: 65 61 (!) 58   Resp:   16   Temp:    (!) 97.5 F (36.4 C)  TempSrc:    Oral  SpO2: 99% 100% 100%     General:  appears to be stated age; alert Skin: warm, dry, no rash Head:  AT/[place]:  Oral mucosa membranes appear moist, normal dentition Neck: supple; trachea midline Heart:  RRR; did not appreciate any M/R/G Lungs: CTAB, did not appreciate any wheezes, rales, or rhonchi Abdomen: + BS; soft, ND, NT Vascular: 2+ pedal pulses b/l; 2+ radial pulses b/l Extremities: no peripheral edema, no muscle wasting; externally rotated left hip   Labs on Admission: I have personally reviewed following labs and imaging studies  CBC: Recent Labs  Lab 03/25/24 1751  WBC 8.2  NEUTROABS 5.7  HGB 11.8*  HCT 37.2  MCV 99.2  PLT 217   Basic Metabolic Panel: Recent Labs  Lab 03/25/24 1751  NA 135  K 3.9  CL 104  CO2 22  GLUCOSE 133*  BUN 10  CREATININE 0.64  CALCIUM  9.6   GFR: Estimated Creatinine Clearance: 33.7 mL/min (by C-G formula based on SCr of 0.64 mg/dL). Liver Function Tests: Recent Labs  Lab 03/25/24 1751  AST 19  ALT 11  ALKPHOS 97  BILITOT 0.3  PROT 6.2*  ALBUMIN  3.3*   No results for input(s): LIPASE, AMYLASE in the last 168 hours. No results for input(s): AMMONIA in the last 168 hours. Coagulation Profile: No results for input(s): INR, PROTIME in the last 168 hours. Cardiac Enzymes: No results for input(s): CKTOTAL, CKMB, CKMBINDEX, TROPONINI in the last 168 hours. BNP (last 3 results) No results for input(s): PROBNP in the last 8760 hours. HbA1C: No results for input(s): HGBA1C in the last 72 hours. CBG: No results for input(s): GLUCAP in the last 168 hours. Lipid Profile: No results for input(s): CHOL, HDL, LDLCALC, TRIG, CHOLHDL, LDLDIRECT in the last 72 hours. Thyroid  Function Tests: No results for input(s): TSH, T4TOTAL, FREET4, T3FREE, THYROIDAB in the last 72 hours. Anemia Panel: No results for input(s): VITAMINB12, FOLATE, FERRITIN, TIBC, IRON, RETICCTPCT in the last 72 hours. Urine analysis:     Component Value Date/Time   COLORURINE COLORLESS (A) 01/11/2023 1301   APPEARANCEUR CLEAR 01/11/2023 1301   LABSPEC <1.005 (L) 01/11/2023 1301   PHURINE 7.0 01/11/2023 1301   GLUCOSEU NEGATIVE 01/11/2023 1301   GLUCOSEU NEGATIVE 11/18/2020 1218   HGBUR NEGATIVE  01/11/2023 1301   BILIRUBINUR NEGATIVE 01/11/2023 1301   BILIRUBINUR NEG 11/24/2022 1353   KETONESUR NEGATIVE 01/11/2023 1301   PROTEINUR NEGATIVE 01/11/2023 1301   UROBILINOGEN 0.2 11/24/2022 1353   UROBILINOGEN 0.2 11/18/2020 1218   NITRITE NEGATIVE 01/11/2023 1301   LEUKOCYTESUR NEGATIVE 01/11/2023 1301    Radiological Exams on Admission: CT Cervical Spine Wo Contrast Result Date: 03/25/2024 CLINICAL DATA:  Polytrauma, blunt.  Fall EXAM: CT HEAD WITHOUT CONTRAST CT CERVICAL SPINE WITHOUT CONTRAST TECHNIQUE: Multidetector CT imaging of the head and cervical spine was performed following the standard protocol without intravenous contrast. Multiplanar CT image reconstructions of the cervical spine were also generated. RADIATION DOSE REDUCTION: This exam was performed according to the departmental dose-optimization program which includes automated exposure control, adjustment of the mA and/or kV according to patient size and/or use of iterative reconstruction technique. COMPARISON:  None Available. FINDINGS: CT HEAD FINDINGS Brain: Cerebral ventricle sizes are concordant with the degree of cerebral volume loss. Patchy and confluent areas of decreased attenuation are noted throughout the deep and periventricular white matter of the cerebral hemispheres bilaterally, compatible with chronic microvascular ischemic disease. No evidence of large-territorial acute infarction. No parenchymal hemorrhage. No mass lesion. No extra-axial collection. No mass effect or midline shift. No hydrocephalus. Basilar cisterns are patent. Vascular: No hyperdense vessel. Atherosclerotic calcifications are present within the cavernous internal carotid and  vertebral arteries. Skull: No acute fracture or focal lesion. Sinuses/Orbits: Paranasal sinuses and mastoid air cells are clear. Bilateral lens replacement. Otherwise the orbits are unremarkable. Other: None. CT CERVICAL SPINE FINDINGS Alignment: Grade 1 anterolisthesis of C5 on C6. Skull base and vertebrae: Multilevel moderate degenerative change of the spine. No acute fracture. No aggressive appearing focal osseous lesion or focal pathologic process. Soft tissues and spinal canal: No prevertebral fluid or swelling. No visible canal hematoma. Upper chest: Biapical pleural/pulmonary scarring. Paraseptal emphysematous changes. Other: Heterogeneous enlarged thyroid  gland. Atherosclerotic plaque of the carotid arteries. IMPRESSION: 1. No acute intracranial abnormality. 2. No acute displaced fracture or traumatic listhesis of the cervical spine. 3.  Aortic aneurysm NOS (ICD10-I71.9). 4. Multinodular goiter. In the setting of significant comorbidities or limited life expectancy, no follow-up recommended (ref: J Am Coll Radiol. 2015 Feb;12(2): 143-50). Electronically Signed   By: Morgane  Naveau M.D.   On: 03/25/2024 18:40   CT Head Wo Contrast Result Date: 03/25/2024 CLINICAL DATA:  Polytrauma, blunt.  Fall EXAM: CT HEAD WITHOUT CONTRAST CT CERVICAL SPINE WITHOUT CONTRAST TECHNIQUE: Multidetector CT imaging of the head and cervical spine was performed following the standard protocol without intravenous contrast. Multiplanar CT image reconstructions of the cervical spine were also generated. RADIATION DOSE REDUCTION: This exam was performed according to the departmental dose-optimization program which includes automated exposure control, adjustment of the mA and/or kV according to patient size and/or use of iterative reconstruction technique. COMPARISON:  None Available. FINDINGS: CT HEAD FINDINGS Brain: Cerebral ventricle sizes are concordant with the degree of cerebral volume loss. Patchy and confluent areas of  decreased attenuation are noted throughout the deep and periventricular white matter of the cerebral hemispheres bilaterally, compatible with chronic microvascular ischemic disease. No evidence of large-territorial acute infarction. No parenchymal hemorrhage. No mass lesion. No extra-axial collection. No mass effect or midline shift. No hydrocephalus. Basilar cisterns are patent. Vascular: No hyperdense vessel. Atherosclerotic calcifications are present within the cavernous internal carotid and vertebral arteries. Skull: No acute fracture or focal lesion. Sinuses/Orbits: Paranasal sinuses and mastoid air cells are clear. Bilateral lens replacement. Otherwise the orbits are  unremarkable. Other: None. CT CERVICAL SPINE FINDINGS Alignment: Grade 1 anterolisthesis of C5 on C6. Skull base and vertebrae: Multilevel moderate degenerative change of the spine. No acute fracture. No aggressive appearing focal osseous lesion or focal pathologic process. Soft tissues and spinal canal: No prevertebral fluid or swelling. No visible canal hematoma. Upper chest: Biapical pleural/pulmonary scarring. Paraseptal emphysematous changes. Other: Heterogeneous enlarged thyroid  gland. Atherosclerotic plaque of the carotid arteries. IMPRESSION: 1. No acute intracranial abnormality. 2. No acute displaced fracture or traumatic listhesis of the cervical spine. 3.  Aortic aneurysm NOS (ICD10-I71.9). 4. Multinodular goiter. In the setting of significant comorbidities or limited life expectancy, no follow-up recommended (ref: J Am Coll Radiol. 2015 Feb;12(2): 143-50). Electronically Signed   By: Morgane  Naveau M.D.   On: 03/25/2024 18:40   DG Hip Unilat With Pelvis 2-3 Views Left Result Date: 03/25/2024 CLINICAL DATA:  pain EXAM: DG HIP (WITH OR WITHOUT PELVIS) 2-3V LEFT COMPARISON:  None Available. FINDINGS: Acute displaced and comminuted left intertrochanteric femoral fracture. No acute displaced fracture or dislocation of the right hip. No  acute displaced fracture or diastasis of the bones of the pelvis. Coarsely calcified pericentimeter lesion along the pelvis may represent a fibroid. there is no evidence of arthropathy or other focal bone abnormality. IMPRESSION: Acute displaced and comminuted left intertrochanteric femoral fracture. Electronically Signed   By: Morgane  Naveau M.D.   On: 03/25/2024 18:32      Assessment/Plan   Principal Problem:   Closed left hip fracture (HCC) Active Problems:   HLD (hyperlipidemia)   Fall at home, initial encounter   Acute hip pain, left   History of COPD   History of essential hypertension    #) Acute left intertrochanteric femur fracture: confirmed via presenting plain films and stemming from ground level mechanical fall without associated loss of consciousness that occurred earlier on the day of admission, as further described above, resulting in immediate develop of acute left hip pain.  At this time, the left lower extremity appears neurovascularly intact, and patient reports adequate pain control.  On daily baby aspirin  as outpatient, but otherwise no additional blood thinners.  EDP discussed with on-call [facility], Dr. Georgina, who conveyed that [facility] will consult, see pt in the AM, and anticipate taking pt to surgery.   Charlanne Score for this patient in the context of anticipated aforementioned orthopedic surgery conveys a  2.2% perioperative risk for significant cardiac event. No evidence to suggest acutely decompensated heart failure or acute MI, including review of presenting EKG. consequently, no absolute contraindications to proceeding with proposed orthopedic surgery at this time.  Plan: Formal orthopedic surgery consult for definitive surgical management, with plan to take patient to the OR tomorrow 8/13. NPO after MN in anticipation of this procedure.  No pharmacologic anticoagulation leading up to this anticipated surgery. SCD's. Prn IV fentanyl . Anticipate postoperative PT  consult. Check INR. Type and screen ordered.  Hold home daily baby aspirin  for now.                   #) Ground level mechanical fall: The patient reports a ground level mechanical fall earlier today in which she tripped while ambulating with her baseline walker, without any associated loss of consciousness. Appears to be purely mechanical in nature, without clinical evidence at this time to suggest contributory dizziness, presyncope, syncope, or acute ischemic CVA. Does not appear to have hit head as component of this fall. presenting CT head showed no evidence of acute intracranial process, including  no evidence of intracranial hemorrhage, and presentation does not appear to be associated any acute neurologic deficits.  While this fall appears to be purely mechanical in nature, will also check urinalysis to evaluate for any underlying infectious contribution.   Plan: Check urinalysis, as above.  CMP and CBC with differential in the morning. Fall precautions. Anticipate postoperative PT consult.                   #) COPD: Documented history thereof, without clinical evidence of acute exacerbation at this time.  This is in the setting of patient being a former smoker. outpatient respiratory regimen includes the following: Prn albuterol  inhaler.   Plan:  Prn albuterol  nebulizer. Check CMP and serum magnesium level in the AM.  Incentive spirometry.                      #) Essential Hypertension: documented h/o such, with outpatient antihypertensive regimen including amlodipine .  SBP's in the ED today: 140s to 160s mmHg.   Plan: Close monitoring of subsequent BP via routine VS. holding home amlodipine  for now in the setting of current n.p.o. status.                      #) Hyperlipidemia: documented h/o such. On rosuvastatin  as outpatient.   Plan: Hold outpatient rosuvastatin  for now in the setting of current n.p.o.  status.      DVT prophylaxis: SCD's   Code Status: dnr, dni, consisten documentation fromt with code status each of the 2 most recent hospitalizations Family Communication: I discussed patient's case with her daughter, who is present at bedside. Disposition Plan: Per Rounding Team Consults called: EDP discussed with on-call [facility], Dr. Georgina, who conveyed that [facility] will consult, see pt in the AM, and anticipate taking pt to surgery. ;  Admission status: Inpatient     I SPENT GREATER THAN 75  MINUTES IN CLINICAL CARE TIME/MEDICAL DECISION-MAKING IN COMPLETING THIS ADMISSION.      Hillery Bhalla B Jahmani Staup DO [facility]  From 7PM - 7AM   03/25/2024, 8:19 PM

## 2024-03-26 ENCOUNTER — Inpatient Hospital Stay (HOSPITAL_COMMUNITY): Admitting: Anesthesiology

## 2024-03-26 ENCOUNTER — Other Ambulatory Visit: Payer: Self-pay | Admitting: Family Medicine

## 2024-03-26 ENCOUNTER — Other Ambulatory Visit: Payer: Self-pay

## 2024-03-26 ENCOUNTER — Inpatient Hospital Stay (HOSPITAL_COMMUNITY)

## 2024-03-26 ENCOUNTER — Encounter (HOSPITAL_COMMUNITY): Payer: Self-pay | Admitting: Internal Medicine

## 2024-03-26 ENCOUNTER — Encounter (HOSPITAL_COMMUNITY)
Admission: EM | Disposition: A | Discharge status: Skilled Nursing Facility | Payer: Self-pay | Source: Skilled Nursing Facility | Attending: Internal Medicine

## 2024-03-26 DIAGNOSIS — S72142A Displaced intertrochanteric fracture of left femur, initial encounter for closed fracture: Secondary | ICD-10-CM | POA: Diagnosis not present

## 2024-03-26 DIAGNOSIS — S72002K Fracture of unspecified part of neck of left femur, subsequent encounter for closed fracture with nonunion: Secondary | ICD-10-CM

## 2024-03-26 DIAGNOSIS — S72102A Unspecified trochanteric fracture of left femur, initial encounter for closed fracture: Secondary | ICD-10-CM

## 2024-03-26 DIAGNOSIS — Z8679 Personal history of other diseases of the circulatory system: Secondary | ICD-10-CM

## 2024-03-26 DIAGNOSIS — M25552 Pain in left hip: Secondary | ICD-10-CM | POA: Diagnosis present

## 2024-03-26 DIAGNOSIS — W19XXXA Unspecified fall, initial encounter: Secondary | ICD-10-CM

## 2024-03-26 DIAGNOSIS — Z8709 Personal history of other diseases of the respiratory system: Secondary | ICD-10-CM

## 2024-03-26 DIAGNOSIS — S72002A Fracture of unspecified part of neck of left femur, initial encounter for closed fracture: Secondary | ICD-10-CM | POA: Diagnosis not present

## 2024-03-26 HISTORY — PX: INTRAMEDULLARY (IM) NAIL INTERTROCHANTERIC: SHX5875

## 2024-03-26 LAB — CBC WITH DIFFERENTIAL/PLATELET
Abs Immature Granulocytes: 0.05 K/uL (ref 0.00–0.07)
Basophils Absolute: 0 K/uL (ref 0.0–0.1)
Basophils Relative: 0 %
Eosinophils Absolute: 0 K/uL (ref 0.0–0.5)
Eosinophils Relative: 0 %
HCT: 32.4 % — ABNORMAL LOW (ref 36.0–46.0)
Hemoglobin: 10.8 g/dL — ABNORMAL LOW (ref 12.0–15.0)
Immature Granulocytes: 0 %
Lymphocytes Relative: 14 %
Lymphs Abs: 1.6 K/uL (ref 0.7–4.0)
MCH: 31.5 pg (ref 26.0–34.0)
MCHC: 33.3 g/dL (ref 30.0–36.0)
MCV: 94.5 fL (ref 80.0–100.0)
Monocytes Absolute: 0.7 K/uL (ref 0.1–1.0)
Monocytes Relative: 7 %
Neutro Abs: 8.7 K/uL — ABNORMAL HIGH (ref 1.7–7.7)
Neutrophils Relative %: 79 %
Platelets: 216 K/uL (ref 150–400)
RBC: 3.43 MIL/uL — ABNORMAL LOW (ref 3.87–5.11)
RDW: 12.2 % (ref 11.5–15.5)
WBC: 11.2 K/uL — ABNORMAL HIGH (ref 4.0–10.5)
nRBC: 0 % (ref 0.0–0.2)

## 2024-03-26 LAB — URINALYSIS, COMPLETE (UACMP) WITH MICROSCOPIC
Bilirubin Urine: NEGATIVE
Glucose, UA: NEGATIVE mg/dL
Ketones, ur: NEGATIVE mg/dL
Leukocytes,Ua: NEGATIVE
Nitrite: NEGATIVE
Protein, ur: NEGATIVE mg/dL
Specific Gravity, Urine: 1.015 (ref 1.005–1.030)
pH: 7 (ref 5.0–8.0)

## 2024-03-26 LAB — PROTIME-INR
INR: 1 (ref 0.8–1.2)
Prothrombin Time: 14 s (ref 11.4–15.2)

## 2024-03-26 LAB — COMPREHENSIVE METABOLIC PANEL WITH GFR
ALT: 11 U/L (ref 0–44)
AST: 19 U/L (ref 15–41)
Albumin: 3.1 g/dL — ABNORMAL LOW (ref 3.5–5.0)
Alkaline Phosphatase: 92 U/L (ref 38–126)
Anion gap: 9 (ref 5–15)
BUN: 10 mg/dL (ref 8–23)
CO2: 26 mmol/L (ref 22–32)
Calcium: 10.1 mg/dL (ref 8.9–10.3)
Chloride: 100 mmol/L (ref 98–111)
Creatinine, Ser: 0.58 mg/dL (ref 0.44–1.00)
GFR, Estimated: >60 mL/min (ref >60)
Glucose, Bld: 144 mg/dL — ABNORMAL HIGH (ref 70–99)
Potassium: 4.3 mmol/L (ref 3.5–5.1)
Sodium: 135 mmol/L (ref 135–145)
Total Bilirubin: 0.6 mg/dL (ref 0.0–1.2)
Total Protein: 5.9 g/dL — ABNORMAL LOW (ref 6.5–8.1)

## 2024-03-26 LAB — SURGICAL PCR SCREEN
MRSA, PCR: NEGATIVE
Staphylococcus aureus: NEGATIVE

## 2024-03-26 LAB — MAGNESIUM: Magnesium: 1.9 mg/dL (ref 1.7–2.4)

## 2024-03-26 LAB — VITAMIN D 25 HYDROXY (VIT D DEFICIENCY, FRACTURES): Vit D, 25-Hydroxy: 48.69 ng/mL (ref 30–100)

## 2024-03-26 SURGERY — FIXATION, FRACTURE, INTERTROCHANTERIC, WITH INTRAMEDULLARY ROD
Anesthesia: Spinal | Site: Leg Upper | Laterality: Left

## 2024-03-26 MED ORDER — GLUCAGON HCL RDNA (DIAGNOSTIC) 1 MG IJ SOLR: 1.0000 mg | INTRAMUSCULAR | Status: DC | PRN

## 2024-03-26 MED ORDER — PROPOFOL 10 MG/ML IV BOLUS
INTRAVENOUS | Status: AC
  Filled 2024-03-26: qty 20

## 2024-03-26 MED ORDER — METOPROLOL TARTRATE 5 MG/5ML IV SOLN: 5.0000 mg | INTRAVENOUS | Status: DC | PRN

## 2024-03-26 MED ORDER — OXYCODONE HCL 5 MG PO TABS
2.5000 mg | ORAL_TABLET | ORAL | Status: DC | PRN
  Administered 2024-03-26 (×4): 5 mg via ORAL
  Filled 2024-03-26 (×2): qty 1

## 2024-03-26 MED ORDER — DEXAMETHASONE SODIUM PHOSPHATE 10 MG/ML IJ SOLN
INTRAMUSCULAR | Status: AC
  Filled 2024-03-26: qty 1

## 2024-03-26 MED ORDER — LACTATED RINGERS IV SOLN: INTRAVENOUS | Status: DC

## 2024-03-26 MED ORDER — VANCOMYCIN HCL 1000 MG IV SOLR
INTRAVENOUS | Status: AC
Start: 2024-03-26 — End: 2024-03-26
  Filled 2024-03-26: qty 20

## 2024-03-26 MED ORDER — FENTANYL CITRATE (PF) 100 MCG/2ML IJ SOLN
INTRAMUSCULAR | Status: AC
  Filled 2024-03-26: qty 2

## 2024-03-26 MED ORDER — VANCOMYCIN HCL 1000 MG IV SOLR
INTRAVENOUS | Status: DC | PRN
Start: 2024-03-26 — End: 2024-03-26
  Administered 2024-03-26 (×2): 1000 mg via TOPICAL

## 2024-03-26 MED ORDER — ORAL CARE MOUTH RINSE: 15.0000 mL | Freq: Once | OROMUCOSAL | Status: AC

## 2024-03-26 MED ORDER — TRANEXAMIC ACID-NACL 1000-0.7 MG/100ML-% IV SOLN
1000.0000 mg | INTRAVENOUS | Status: AC
  Administered 2024-03-26 (×2): 1000 mg via INTRAVENOUS
  Filled 2024-03-26: qty 100

## 2024-03-26 MED ORDER — EPHEDRINE 5 MG/ML INJ
INTRAVENOUS | Status: AC
Start: 2024-03-26 — End: 2024-03-26
  Filled 2024-03-26: qty 5

## 2024-03-26 MED ORDER — PHENOL 1.4 % MT LIQD
1.0000 | OROMUCOSAL | Status: DC | PRN
  Administered 2024-03-27: 1 via OROMUCOSAL
  Filled 2024-03-26: qty 177

## 2024-03-26 MED ORDER — ONDANSETRON HCL 4 MG/2ML IJ SOLN
INTRAMUSCULAR | Status: DC | PRN
  Administered 2024-03-26 (×2): 4 mg via INTRAVENOUS

## 2024-03-26 MED ORDER — PROPOFOL 10 MG/ML IV BOLUS
INTRAVENOUS | Status: AC
Start: 2024-03-26 — End: 2024-03-26
  Filled 2024-03-26: qty 20

## 2024-03-26 MED ORDER — VASOPRESSIN 20 UNIT/ML IV SOLN
INTRAVENOUS | Status: DC | PRN
  Administered 2024-03-26 (×2): 1 [IU] via INTRAVENOUS

## 2024-03-26 MED ORDER — TOBRAMYCIN SULFATE 1.2 G IJ SOLR
INTRAMUSCULAR | Status: DC | PRN
Start: 2024-03-26 — End: 2024-03-26
  Administered 2024-03-26 (×2): 1.2 g via TOPICAL

## 2024-03-26 MED ORDER — MUPIROCIN 2 % EX OINT
1.0000 | TOPICAL_OINTMENT | Freq: Two times a day (BID) | CUTANEOUS | Status: AC
  Administered 2024-03-26 – 2024-03-30 (×11): 1 via NASAL
  Filled 2024-03-26 (×4): qty 22

## 2024-03-26 MED ORDER — CEFAZOLIN SODIUM-DEXTROSE 2-4 GM/100ML-% IV SOLN
2.0000 g | INTRAVENOUS | Status: AC
  Administered 2024-03-26 (×2): 2 g via INTRAVENOUS
  Filled 2024-03-26: qty 100

## 2024-03-26 MED ORDER — TOBRAMYCIN SULFATE 1.2 G IJ SOLR
INTRAMUSCULAR | Status: AC
  Filled 2024-03-26: qty 1.2

## 2024-03-26 MED ORDER — PROPOFOL 500 MG/50ML IV EMUL
INTRAVENOUS | Status: DC | PRN
  Administered 2024-03-26 (×2): 25 ug/kg/min via INTRAVENOUS

## 2024-03-26 MED ORDER — BUPIVACAINE IN DEXTROSE 0.75-8.25 % IT SOLN
INTRATHECAL | Status: DC | PRN
Start: 2024-03-26 — End: 2024-03-26
  Administered 2024-03-26 (×2): 2 mL via INTRATHECAL

## 2024-03-26 MED ORDER — CHLORHEXIDINE GLUCONATE 0.12 % MT SOLN: 15.0000 mL | Freq: Once | OROMUCOSAL | Status: AC

## 2024-03-26 MED ORDER — PHENYLEPHRINE HCL-NACL 20-0.9 MG/250ML-% IV SOLN
INTRAVENOUS | Status: DC | PRN
  Administered 2024-03-26 (×2): 25 ug/min via INTRAVENOUS

## 2024-03-26 MED ORDER — IPRATROPIUM-ALBUTEROL 0.5-2.5 (3) MG/3ML IN SOLN
3.0000 mL | RESPIRATORY_TRACT | Status: DC | PRN
  Administered 2024-03-27: 3 mL via RESPIRATORY_TRACT
  Filled 2024-03-26: qty 3

## 2024-03-26 MED ORDER — MORPHINE SULFATE (PF) 2 MG/ML IV SOLN
2.0000 mg | INTRAVENOUS | Status: AC | PRN
  Administered 2024-03-28 (×3): 2 mg via INTRAVENOUS
  Filled 2024-03-26 (×3): qty 1

## 2024-03-26 MED ORDER — PHENYLEPHRINE 80 MCG/ML (10ML) SYRINGE FOR IV PUSH (FOR BLOOD PRESSURE SUPPORT)
PREFILLED_SYRINGE | INTRAVENOUS | Status: AC
  Filled 2024-03-26: qty 10

## 2024-03-26 MED ORDER — DEXTROSE-SODIUM CHLORIDE 5-0.45 % IV SOLN: INTRAVENOUS | Status: DC

## 2024-03-26 MED ORDER — CHLORHEXIDINE GLUCONATE 0.12 % MT SOLN
OROMUCOSAL | Status: AC
  Administered 2024-03-26 (×2): 15 mL via OROMUCOSAL
  Filled 2024-03-26: qty 15

## 2024-03-26 MED ORDER — ROCURONIUM BROMIDE 10 MG/ML (PF) SYRINGE
PREFILLED_SYRINGE | INTRAVENOUS | Status: AC
  Filled 2024-03-26: qty 10

## 2024-03-26 MED ORDER — ONDANSETRON HCL 4 MG/2ML IJ SOLN
INTRAMUSCULAR | Status: AC
  Filled 2024-03-26: qty 2

## 2024-03-26 MED ORDER — CEFAZOLIN SODIUM-DEXTROSE 2-4 GM/100ML-% IV SOLN
2.0000 g | Freq: Three times a day (TID) | INTRAVENOUS | Status: AC
  Administered 2024-03-27 (×2): 2 g via INTRAVENOUS
  Filled 2024-03-26 (×2): qty 100

## 2024-03-26 MED ORDER — PROPOFOL 10 MG/ML IV BOLUS
INTRAVENOUS | Status: DC | PRN
  Administered 2024-03-26: 10 mg via INTRAVENOUS
  Administered 2024-03-26: 20 mg via INTRAVENOUS
  Administered 2024-03-26 (×3): 10 mg via INTRAVENOUS
  Administered 2024-03-26: 20 mg via INTRAVENOUS

## 2024-03-26 MED ORDER — HYDRALAZINE HCL 20 MG/ML IJ SOLN: 10.0000 mg | INTRAMUSCULAR | Status: DC | PRN

## 2024-03-26 MED ORDER — LIDOCAINE 2% (20 MG/ML) 5 ML SYRINGE
INTRAMUSCULAR | Status: AC
  Filled 2024-03-26: qty 5

## 2024-03-26 MED ORDER — ACETAMINOPHEN 500 MG PO TABS
1000.0000 mg | ORAL_TABLET | Freq: Three times a day (TID) | ORAL | Status: DC
  Administered 2024-03-26 – 2024-04-01 (×14): 1000 mg via ORAL
  Filled 2024-03-26 (×15): qty 2

## 2024-03-26 MED ORDER — ALBUTEROL SULFATE (2.5 MG/3ML) 0.083% IN NEBU: 2.5000 mg | INHALATION_SOLUTION | RESPIRATORY_TRACT | Status: DC | PRN

## 2024-03-26 MED ORDER — ALBUMIN HUMAN 5 % IV SOLN: INTRAVENOUS | Status: DC | PRN

## 2024-03-26 MED ORDER — 0.9 % SODIUM CHLORIDE (POUR BTL) OPTIME
TOPICAL | Status: DC | PRN
  Administered 2024-03-26 (×2): 1000 mL

## 2024-03-26 MED ORDER — PHENYLEPHRINE 80 MCG/ML (10ML) SYRINGE FOR IV PUSH (FOR BLOOD PRESSURE SUPPORT)
PREFILLED_SYRINGE | INTRAVENOUS | Status: DC | PRN
  Administered 2024-03-26 (×6): 80 ug via INTRAVENOUS

## 2024-03-26 SURGICAL SUPPLY — 31 items
BIT DRILL INTERTAN LAG SCREW (BIT) IMPLANT
BIT DRILL SHORT 4.0 (BIT) IMPLANT
BNDG COHESIVE 6X5 TAN NS LF (GAUZE/BANDAGES/DRESSINGS) ×1 IMPLANT
COVER PERINEAL POST (MISCELLANEOUS) ×1 IMPLANT
COVER SURGICAL LIGHT HANDLE (MISCELLANEOUS) ×1 IMPLANT
DRAPE C-ARM 42X72 X-RAY (DRAPES) ×1 IMPLANT
DRAPE C-ARMOR (DRAPES) ×1 IMPLANT
DRAPE STERI IOBAN 125X83 (DRAPES) ×1 IMPLANT
DRSG TEGADERM 4X4.75 (GAUZE/BANDAGES/DRESSINGS) ×5 IMPLANT
DURAPREP 26ML APPLICATOR (WOUND CARE) ×1 IMPLANT
GAUZE SPONGE 4X4 12PLY STRL (GAUZE/BANDAGES/DRESSINGS) ×1 IMPLANT
GAUZE XEROFORM 1X8 LF (GAUZE/BANDAGES/DRESSINGS) ×1 IMPLANT
GAUZE XEROFORM 5X9 LF (GAUZE/BANDAGES/DRESSINGS) IMPLANT
GLOVE INDICATOR 7.5 STRL GRN (GLOVE) ×1 IMPLANT
GLOVE SS BIOGEL STRL SZ 7.5 (GLOVE) ×1 IMPLANT
GOWN STRL SURGICAL XL XLNG (GOWN DISPOSABLE) ×1 IMPLANT
KIT BASIN OR (CUSTOM PROCEDURE TRAY) ×1 IMPLANT
KIT TURNOVER KIT B (KITS) ×1 IMPLANT
NAIL INTERTAN LEFT 11.5X40 (Nail) IMPLANT
NS IRRIG 1000ML POUR BTL (IV SOLUTION) ×1 IMPLANT
PACK GENERAL/GYN (CUSTOM PROCEDURE TRAY) ×1 IMPLANT
PAD ARMBOARD POSITIONER FOAM (MISCELLANEOUS) ×1 IMPLANT
PIN GUIDE 3.2X343MM (PIN) IMPLANT
ROD GUIDE 3.0 (MISCELLANEOUS) IMPLANT
SCREW LAG COMPR KIT 80/75 (Screw) IMPLANT
SCREW TRIGEN LOW PROF 5.0X35 (Screw) IMPLANT
SCREW TRIGEN LOW PROF 5.0X37.5 (Screw) IMPLANT
STAPLER VISISTAT (STAPLE) ×1 IMPLANT
SUT VIC AB 0 CT1 18XCR BRD8 (SUTURE) ×1 IMPLANT
SUT VIC AB 2-0 CT1 18 (SUTURE) ×1 IMPLANT
WATER STERILE IRR 1000ML POUR (IV SOLUTION) ×1 IMPLANT

## 2024-03-26 NOTE — Transfer of Care (Signed)
 Immediate Anesthesia Transfer of Care Note  Patient: Linda [name]  Procedure(s) Performed: FIXATION, FRACTURE, INTERTROCHANTERIC, WITH INTRAMEDULLARY, LEFT (Left: Leg Upper)  Patient Location: PACU  Anesthesia Type:MAC and Spinal  Level of Consciousness: awake, alert , and oriented  Airway & Oxygen Therapy: Patient Spontanous Breathing and Patient connected to face mask oxygen  Post-op Assessment: Report given to RN and Post -op Vital signs reviewed and stable  Post vital signs: Reviewed and stable  Last Vitals:  Vitals Value Taken Time  BP 96/55 03/26/24 20:38  Temp    Pulse 84 03/26/24 20:44  Resp 21 03/26/24 20:44  SpO2 86 % 03/26/24 20:44  Vitals shown include unfiled device data.  Last Pain:  Vitals:   03/26/24 1620  TempSrc: Oral  PainSc:          Complications: No notable events documented.

## 2024-03-26 NOTE — Anesthesia Postprocedure Evaluation (Signed)
 Anesthesia Post Note  Patient: Linda [name]  Procedure(s) Performed: FIXATION, FRACTURE, INTERTROCHANTERIC, WITH INTRAMEDULLARY, LEFT (Left: Leg Upper)     Patient location during evaluation: PACU Anesthesia Type: Spinal Level of consciousness: awake and alert Pain management: pain level controlled Vital Signs Assessment: post-procedure vital signs reviewed and stable Respiratory status: spontaneous breathing and respiratory function stable Cardiovascular status: blood pressure returned to baseline and stable Postop Assessment: no headache, no backache, no apparent nausea or vomiting, spinal receding and patient able to bend at knees Anesthetic complications: no   No notable events documented.  Last Vitals:  Vitals:   03/26/24 2114 03/26/24 2115  BP: (!) 110/57 (!) 110/57  Pulse: 79 78  Resp: (!) 24 16  Temp:  36.7 C  SpO2: 94% 97%    Last Pain:  Vitals:   03/26/24 1620  TempSrc: Oral  PainSc:                  Yoana Staib,W. EDMOND

## 2024-03-26 NOTE — Telephone Encounter (Signed)
 Copied from CRM [id number]. Topic: Clinical - Medication Refill >> Mar 26, 2024  9:24 AM Burnard DEL wrote: Medication: simethicone  (INFANTS SIMETHICONE ) 40 MG/0.6ML drops  Has the patient contacted their pharmacy? Yes (Agent: If no, request that the patient contact the pharmacy for the refill. If patient does not wish to contact the pharmacy document the reason why and proceed with request.) (Agent: If yes, when and what did the pharmacy advise?)  This is the patient's preferred pharmacy:    [facility] [place] - [address] [place] [address] Phone: [phone] Fax: [phone]  Is this the correct pharmacy for this prescription? Yes If no, delete pharmacy and type the correct one.   Has the prescription been filled recently? No  Is the patient out of the medication? Yes  Has the patient been seen for an appointment in the last year OR does the patient have an upcoming appointment? Yes  Can we respond through MyChart? No  Agent: Please be advised that Rx refills may take up to 3 business days. We ask that you follow-up with your pharmacy.  **Patient is currently in [facility] living in [place] and they would need this prescription on file for patient to receive**

## 2024-03-26 NOTE — Progress Notes (Signed)
 Orthopedic Surgery Progress Note   Assessment: Patient is a 89 y.o. female with left pertrochanteric femur fracture status post CMN   Plan: -Operative plans: complete -Diet: regular -DVT ppx: aspirin  81mg  BID -Antibiotics: ancef  x2 post-op doses -Weight bearing status: as tolerated -PT evaluate and treat -Pain control -Dispo: per primary  ___________________________________________________________________________  Subjective: No acute events since surgery. Recovering in PACU. Pain under control.    Physical Exam:  General: no acute distress, appears stated age Neurologic: alert, answering questions appropriately, following commands Respiratory: unlabored breathing on room air, symmetric chest rise Psychiatric: appropriate affect, normal cadence to speech  MSK:   -Left lower extremity  Dressings over thigh c/d/i EHL/TA/GSC intact Plantarflexes and dorsiflexes toes Sensation intact to light touch in sural, saphenous, tibial, deep peroneal, and superficial peroneal nerve distributions Foot warm and well perfused   Yesterday's total administered Morphine  Milligram Equivalents: 22.5   Patient name: Linda [name] Patient MRN: [id number] Date: 03/26/24

## 2024-03-26 NOTE — Progress Notes (Signed)
 Patients bladder scanned >98 in bladder.  Dr. Epifanio MDA notified no new orders.  XRay at bedside.

## 2024-03-26 NOTE — Progress Notes (Signed)
 Transition of Care [facility]) - CAGE-AID Screening   Patient Details  Name: Linda [name] MRN: [id number] Date of Birth: [birth year]   [name], RN Trauma Response Nurse Phone Number: [phone] 03/26/2024, 9:47 AM    CAGE-AID Screening:    Have You Ever Felt You Ought to Cut Down on Your Drinking or Drug Use?: No Have People Annoyed You By Critizing Your Drinking Or Drug Use?: No Have You Felt Bad Or Guilty About Your Drinking Or Drug Use?: No Have You Ever Had a Drink or Used Drugs First Thing In The Morning to Steady Your Nerves or to Get Rid of a Hangover?: No CAGE-AID Score: 0  Substance Abuse Education Offered: (S) No (Denies any need for services)

## 2024-03-26 NOTE — Hospital Course (Addendum)
 Brief Narrative:   89 y.o. female with medical history significant for COPD, essential hypertension, hyperlipidemia, who is admitted to [facility] on 03/25/2024 with acute left intertrochanteric femur fracture after ground-level mechanical fall at assisted living after presenting from home to [facility] ED complaining of fall. Status post ORIF 8/13.  Postop management, wound care, pain control, postop therapy recommendations per their service.  Postop had persistent encephalopathy therefore MRI brain was performed which showed concerns of acute CVA in the watershed area therefore neurology team was consulted.  It was eventually determined to continue her daily aspirin  and avoid any DAPT secondary to risk of anemia. Plans for SNF placement.  Assessment & Plan:  Principal Problem:   Closed left hip fracture (HCC) Active Problems:   HLD (hyperlipidemia)   Fall at home, initial encounter   Acute hip pain, left   History of COPD   History of essential hypertension   Mechanical fall with acute left-sided intertrochanteric femur fracture - Status post ORIF 8/13.  Postop management, wound care, pain control, postop therapy recommendations per their service.  -PT/OT-SNF  Acute blood loss anemia, postop vs dilutional.  - Hemoglobin baseline 10.8, trended down to 6.8 requiring 1 unit PRBC transfusion.  Hemoglobin now 8.1.  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  Had some vitamin B12 deficiency but MRI performed this morning shows acute patchy watershed CVA.  Seen by neurology team who has recommended continuing aspirin .  Due to recent anemia, avoiding DAPT.  Echo showing preserved EF with asymmetric hypertrophy and elevated PASP.  If patient's day goes well enough and require something at bedtime to sleep, resume her midodrine at 7.5 mg at bedtime dose  -Concerns of occult CVA in the past.  She had completed 3 weeks of DAPT now on  aspirin  alone.  Previously elected not to proceed with any kind of long-term monitoring  Vitamin B12 deficiency - Started supplements  Hypophosphatemia -replete prn  COPD - As needed bronchodilators  Essential hypertension - IV as needed  Hyperlipidemia -Currently on hold  Foley placed due to urinary retention.   Seen by palliative-outpatient follow-up   DVT prophylaxis: SCDs Start: 03/25/24 2017 Code = DNR/DNI Family Communication: Daughter present at bedside Status is: Inpatient SNF placement  Subjective: Watching TV no complaints.  Examination:  General exam: Appears calm and comfortable, elderly frail and cachectic, drowsy but easily arousable Respiratory system: Clear to auscultation. Respiratory effort normal. Cardiovascular system: S1 & S2 heard, RRR. No JVD, murmurs, rubs, gallops or clicks. No pedal edema. Gastrointestinal system: Abdomen is nondistended, soft and nontender. No organomegaly or masses felt. Normal bowel sounds heard. Central nervous system: Alert and oriented to name only. No focal neurological deficits. Extremities: Symmetric 4 x 5 power. Skin: Surgical dressing noted Psychiatry: Judgement and insight appear poor

## 2024-03-26 NOTE — Discharge Instructions (Addendum)
 Orthopedic Surgery Discharge Instructions  Patient name: Linda [name] Fracture: left pertrochanteric femur fracture Procedure Performed: left hip cephalomedullary rodding Date of Surgery: 03/26/2024 Surgeon: Ozell Ada, MD  Activity: You are allowed to put as much [name] on your leg as you would like. You can walk as much as you would like. You can perform household activities such as cleaning dishes, doing laundry, vacuuming, etc.  Incision Care: Your incision site has a dressing over it. That dressing should remain in place and dry at all times for a total of one week after surgery. After one week, you can remove the dressing. Underneath the dressing, you will find skin staples. You should leave these staples in place. They will be taken out in the office when the wound has healed. Do not pick, rub, or scrub at them. Do not put cream or lotion over the surgical area. After one week and once the dressing is off, it is okay to let soap and water run over your incision. Again, do not pick, scrub, or rub at the staples when bathing. Do not submerge (e.g., take a bath, swim, go in a hot tub, etc.) until six weeks after surgery. There may be some bloody drainage from the incision into the dressing after surgery. This is normal. You do not need to replace the dressing. Continue to leave it in place for the one week as instructed above. Should the dressing become saturated with blood or drainage, please call the office for further instructions.   Medications: You have been prescribed oxycodone . This is a narcotic pain medication and should only be taken as prescribed. You should not drink alcohol  or operate heavy machinery (including driving) while taking this medication. The oxycodone  can cause constipation as a side effect. For that reason, you have been prescribed senna and miralax . These are both laxatives. You do not need to take this medication if you develop diarrhea. Should you remain constipated  even while taking the senna and miralax , please use the miralax  twice daily. Tylenol  has been prescribed to be taken every 8 hours, which will give you additional pain relief.   You have been prescribed aspirin  as a blood thinner. This medication is to be taken to prevent blood clots. Take 81 milligrams twice daily. You should refrain from using other blood thinners (warfarin, apixaban, plavix , xarelto, etc.) while using the aspirin . You will need to take this medication for a total of 6 weeks after your surgery.   You should not use over-the-counter NSAIDs (ibuprofen, Aleve, Celebrex, naproxen, meloxicam, etc.) for pain relief because aspirin  is a similar medication. There can be side effects including but not limited to kidney injury and ulcers if you take these type of medications with the aspirin .  In order to set expectations for opioid prescriptions, you will only be prescribed opioids for a total of six weeks after surgery and, at two-weeks after surgery, your opioid prescription will start to tapered (decreased dosage and number of pills). If you have ongoing need for opioid medication six weeks after surgery, you will be referred to pain management. If you are already established with a provider that is giving you opioid medications, you should schedule an appointment with them for six weeks after surgery if you feel you are going to need another prescription. State law only allows for opioid prescriptions one week at a time. If you are running out of opioid medication near the end of the week, please call the office during business hours before running  out so I can send you another prescription.   Diet: You are safe to resume your regular diet after surgery.   Reasons to Call the Office After Surgery: You should feel free to call the office with any concerns or questions you have in the post-operative period, but you should definitely notify the office if you develop: -shortness of breath, chest  pain, or trouble breathing -excessive bleeding, drainage, redness, or swelling around the surgical site -fevers, chills, or pain that is getting worse with each passing day -persistent nausea or vomiting -new weakness in the left leg, new or worsening numbness or tingling in the left leg -other concerns about your surgery  Follow Up Appointments: You have an office appointment scheduled with Dr. Georgina on 04/16/2024 at 10am. The office location and phone number are listed below. Please arrive on time to your office visit.   Office Information:  -Ozell Georgina, MD -Phone number: [phone] -Address: [address]       [place], [place] [address]

## 2024-03-26 NOTE — Consult Note (Signed)
 Orthopedic Surgery H&P Note  Assessment: Patient is a 89 y.o. female with left pertrochanteric femur fracture   Plan: - Planning for operative fixation tonight -Diet: N.p.o. starting at 9:30 AM -DVT ppx: aspirin  81mg  BID postoperatively -Ancef  and TXA on call to OR -Weight bearing status: NWB LLE -PT evaluate and treat post-op -Pain control -Dispo: pending completion of operative plans   Discussed recommendation for operative intervention in the form of left intertrochanteric femur fracture cephalomedullary rodding. Explained the risks of this procedure included, but were not limited to: nonunion, malunion, hardware failure, infection, bleeding, stiffness, screw cut out, need for additional procedures, deep vein thrombosis, pulmonary embolism, and death. The benefits of this procedure would be to promote fracture healing by providing stability and to allow for early mobilization. The alternatives of this surgery would be to treat the fracture with immobilization, placed the patient in traction, or to do no intervention. The patient's questions were answered to her and her daughter's satisfaction. After this discussion, patient elected to proceed with surgery. Informed consent was obtained.   ___________________________________________________________________________   Reason for consult: left pertrochanteric femur fracture  History:  Patient is a 89 y.o. female who had a ground level fall at her assisted living facility yesterday.  She noted immediate onset of left hip pain and was unable to ambulate.  She was brought to [facility], ER.  She was admitted to medicine yesterday.  She is reporting left hip pain.  No pain elsewhere.  Review of systems: General: denies fevers and chills, myalgias Neurologic: denies recent changes in vision, slurred speech Abdomen: denies nausea, vomiting, hematemesis Respiratory: denies cough, shortness of breath  Past Medical History:  Diagnosis Date    Arthritis    COPD (chronic obstructive pulmonary disease) (HCC)    chronic cough   Glaucoma    Hyperlipidemia    Hypertension    Irregular heartbeat    Osteoporosis    Thyroid  disease    enlarged thyroid     Allergies  Allergen Reactions   Cymbalta  [Duloxetine  Hcl] Other (See Comments)    Unknown reaction     Past Surgical History:  Procedure Laterality Date   APPENDECTOMY     cateract surgery     COLONOSCOPY     Done in [place]  yrs ago   UPPER GASTROINTESTINAL ENDOSCOPY  2019   WRIST SURGERY      Social History   Tobacco Use   Smoking status: Former    Current packs/day: 0.00    Average packs/day: 0.3 packs/day for 2.0 years (0.5 ttl pk-yrs)    Types: Cigarettes    Start date: 84    Quit date: 48    Years since quitting: 73.6    Passive exposure: Past   Smokeless tobacco: Never  Substance Use Topics   Alcohol  use: Not Currently     Family history: -reviewed and not pertinent to hip fracture   Physical Exam:  General: no acute distress, appears stated age Neurologic: alert, answering questions appropriately, following commands Cardiovascular: regular rate, no cyanosis Respiratory: unlabored breathing on room air, symmetric chest rise Psychiatric: appropriate affect, normal cadence to speech  MSK:   -Bilateral upper extremities  No tenderness to palpation over extremity, no gross deformity, no open wounds Fires deltoid, biceps, triceps, wrist extensors, wrist flexors, finger extensors, finger flexors  AIN/PIN/IO intact  Palpable radial pulse  Sensation intact to light touch in median/ulnar/radial/axillary nerve distributions  Hand warm and well perfused  - Right lower extremity  No tenderness to  palpation over extremity, no gross deformity, no open wounds, no pain with logroll Fires hip flexors, quadriceps, hamstrings, tibialis anterior, gastrocnemius and soleus, extensor hallucis longus Plantarflexes and dorsiflexes toes Sensation intact to light  touch in sural, saphenous, tibial, deep peroneal, and superficial peroneal nerve distributions Foot warm and well perfused  -Left lower extremity  No tenderness to palpation over extremity, except around the hip.  Pain with logroll.  Leg shorter than contralateral side.  No open wounds EHL/TA/GSC intact.  Does not fire proximal musculature due to pain Plantarflexes and dorsiflexes toes Sensation intact to light touch in sural, saphenous, tibial, deep peroneal, and superficial peroneal nerve distributions Foot warm and well perfused  Imaging: XRs of the left hip from 03/25/2024 was independently reviewed and interpreted, showing a pertrochanteric femur fracture.  There is shortening through the fracture and varus alignment.  There is a displaced lesser trochanteric fracture as well.  No other fracture seen.   Patient name: Linda [name] Patient MRN: [id number] Date: 03/26/24

## 2024-03-26 NOTE — Op Note (Signed)
 Orthopedic Surgery Operative Report   Procedure: Left pertrochanteric fracture intramedullary rodding   Modifier: none   Date of procedure: 03/26/2024   Patient name: Linda [name]   MRN: [id number]  DOB: 1923/09/29   Surgeon: Ozell Ada, MD Assistant: none Pre-operative diagnosis: left pertrochanteric femur fracture Post-operative diagnosis: same as above Findings: left intertrochanteric femur fracture   Specimens: none Anesthesia: general EBL: 100cc Complications: none Pre-incision antibiotic: ancef  TXA given prior to incision as well   Implants:  Implant Name Type Inv. Item Serial No. Manufacturer Lot No. LRB No. Used Action  NAIL INTERTAN LEFT 11.5X40 - [id number] Nail NAIL INTERTAN LEFT 11.5X40  [facility] [employer] [id number] Left 1 Implanted  SCREW LAG COMPR KIT 80/75 - [id number] Screw SCREW LAG COMPR KIT 80/75  [facility] [employer] [id number] Left 1 Implanted  SCREW TRIGEN LOW PROF 5.0X35 - [id number] Screw SCREW TRIGEN LOW PROF 5.0X35  [employer] [id number] Left 1 Implanted  SCREW TRIGEN LOW PROF 5.0X37.5 - [id number] Screw SCREW TRIGEN LOW PROF 5.0X37.5  [employer] [id number] Left 1 Implanted       Indication for procedure: Patient is a 89 y.o. female who presented to the ER after a ground level fall. The patient had left hip pain and x-rays revealed a pertrochanteric femur fracture. The patient was admitted to a medicine service with orthopedics consulted. I met the patient and discussed the fracture. I recommended operative management in the form of intramedullary rodding to stabilize the fracture and allow for mobilization. Explained the risks of this procedure included, but were not limited to: nonunion, malunion, fixation failure, infection, bleeding, stiffness, need for additional procedures, deep vein thrombosis, pulmonary embolism, MI, arrhythmia, and death. The alternatives of this surgery would be to treat the  fracture with immobilization, treat it in traction, or to perform no intervention. After our discussion, patient elected to proceed with surgery.    Procedure Description: The patient was met in the pre-operative holding area. The patient's identity and consent were verified. The operative site was marked by myself. The patient and her daughter's remaining questions about the surgery were answered. The patient was brought back to the operating room. General anesthesia was induced and an endotracheal tube was placed by the anesthesia staff. The patient was transferred to the [facility] table. All bony prominences were well padded. Traction was applied and an attempt at closed reduction with the Hana table was made. Fluoroscopy confirmed a satisfactory reduction with slight valgus alignment. The surgical area was cleansed with alcohol . Ancef  and TXA were administered by anesthesia. The patient's skin was then prepped and draped in a standard, sterile fashion. A time out was performed that identified the patient, the procedure, and the operative site. All team members agreed with what was stated in the time out.    An incision was made just proximal and inferior to the greater trochanter. The incision was taken sharply down through the fascia. A guide pin was inserted into the wound onto the top of the greater trochanter. Fluoroscopy was used to place the guide pin at the starting point at the tip of the greater trochanter and in line with the middle of the femoral neck. The wire was then advanced to a point just past the lesser trochanter. A soft tissue sleeve was advanced over the wire onto the greater trochanter. An entry reamer was used to open the proximal femoral canal under fluoroscopic guidance. The pin and reamer were removed. A long guide wire was  placed down the femoral canal. It was advanced to the superior aspect of the patella under fluoroscopy. The length of the nail was estimated off of the guide wire.  The nail measure about 41 so a 40 was selected. A 9mm reamer was inserted over the guidewire and used to ream the femoral canal. It was advanced down past the isthmus under fluoroscopic guidance. The canal was serially reamed with increasing sized reamers until a 12.28mm reamer at which point there was chatter. A 11.5 nail was selected. The nail was advanced over the guidewire. It was advanced until the lag screws were estimated to end in the center of the femoral head. The guide wire was removed.    An incision was made sharply through the skin, dermis, and fascia over the lateral thigh in the area where the lag screws would be inserted. The lag screw inserter was placed through the jig onto the lateral femoral cortex. A guide wire was advanced through the lag screw inserted into the femoral head under fluoroscopic guidance. It was found to be in acceptable position on the AP and lateral views. The length of the lag screw was estimated off of the guide wire. A 80mm screw was selected. The inferior lag screw was drilled through the guide. The derotation device was placed through the inserter. The proximal lag screw hole was then drilled over the guide wire. The screw was inserted over the wire under fluoroscopic guidance. The derotation bar was removed and the inferior lag screw was inserted.    The C arm was then brought to the knee in a lateral position to obtain perfect circles at the distal interlocking holes. An incision was made over the distal interlocking holes on the lateral aspect of the femur. This incision was taken down through fascia. A drill was inserted into the wound and placed over the interlocking hole using perfect circle technique. The hole was then drilled bicortically. A depth gauge was used to estimate the screw length. A 35mm screw was selected for the more proximal hole. This was inserted and there was good purchase. The same process was then repeated to insert a 37.86mm screw in the more  distal hole.    Final AP and lateral fluoroscopic images were then taken of the hip, femur, and knee showing satisfactory reduction and placement of the cephalomedullary rod. The wounds were copiously irrigated with sterile saline. Vancomycin  and tobramycin  powder was placed into the wounds. The fascia was closed with 0 vicryl. The deep dermal layer was closed with 2-0 vicryl. The skin was closed with staples. Dressings were applied. All counts were correct at the end of the case. Patient was transferred back to a hospital bed. The patient was awakened from anesthesia and brought back to the post-anesthesia care unit in stable condition.     Post-operative plan: The patient will recover in the post-anesthesia care unit and then go to the floor on the medicine service. The patient will receive two post-operative doses of ancef . She will get another dose of TXA. The patient will be weight bearing as tolerated. The patient will work with physical therapy. The patient's disposition will be determined by the medicine service.        Ozell Ada, MD Orthopedic Surgeon

## 2024-03-26 NOTE — Progress Notes (Signed)
 PROGRESS NOTE    Linda [name]  FMW:[id number] DOB: May 09, 1924 DOA: 03/25/2024 PCP: Kennyth Worth HERO, MD    Brief Narrative:   89 y.o. female with medical history significant for COPD, essential hypertension, hyperlipidemia, who is admitted to [facility] on 03/25/2024 with acute left intertrochanteric femur fracture after ground-level mechanical fall at assisted living after presenting from home to [facility] ED complaining of fall.  Orthopedic planning on ORIF.   Assessment & Plan:  Principal Problem:   Closed left hip fracture (HCC) Active Problems:   HLD (hyperlipidemia)   Fall at home, initial encounter   Acute hip pain, left   History of COPD   History of essential hypertension   Mechanical fall with acute left-sided intertrochanteric femur fracture - Seen by orthopedic.  Planning on ORIF later today.  Postop management, wound care, pain control, postop therapy recommendations per their service.  IV fluids while n.p.o. -PT/OT  COPD - As needed bronchodilators  Essential hypertension - IV as needed  Hyperlipidemia -Currently on hold  Concerns of urinary retention, bladder scan ordered   DVT prophylaxis: SCDs Start: 03/25/24 2017    Code Status: Limited: Do not attempt resuscitation (DNR) -DNR-LIMITED -Do Not Intubate/DNI  Family Communication:   Status is: Inpatient Remains inpatient appropriate because: Plans for ORIF    Subjective:  Seen at bedside.  Daughter is also present at bedside.  Reporting of some urinary retention.  Patient does have pain at the fracture site as expected  Examination:  General exam: Appears calm and comfortable  Respiratory system: Clear to auscultation. Respiratory effort normal. Cardiovascular system: S1 & S2 heard, RRR. No JVD, murmurs, rubs, gallops or clicks. No pedal edema. Gastrointestinal system: Abdomen is nondistended, soft and nontender. No organomegaly or masses felt. Normal bowel sounds heard. Central nervous system:  Alert and oriented. No focal neurological deficits. Extremities: Symmetric 5 x 5 power. Skin: No rashes, lesions or ulcers Psychiatry: Judgement and insight appear normal. Mood & affect appropriate.                Diet Orders (From admission, onward)     Start     Ordered   03/27/24 0930  Diet NPO time specified Except for: Sips with Meds  Diet effective midnight       Question:  Except for  Answer:  Noralyn with Meds   03/26/24 9162   03/26/24 0837  Diet regular Room service appropriate? Yes; Fluid consistency: Thin  Diet effective now       Question Answer Comment  Room service appropriate? Yes   Fluid consistency: Thin      03/26/24 0836            Objective: Vitals:   03/25/24 2115 03/25/24 2248 03/26/24 0339 03/26/24 0811  BP: (!) 154/66 (!) 141/73 (!) 149/64 (!) 141/51  Pulse: 76 73 79 74  Resp: 20 16  18   Temp:  (!) 97.5 F (36.4 C) (!) 97.5 F (36.4 C) 98.8 F (37.1 C)  TempSrc:  Oral Oral Oral  SpO2: 100% 98% 100% 99%  Weight:  55.7 kg    Height:  5' 4 (1.626 m)      Intake/Output Summary (Last 24 hours) at 03/26/2024 1248 Last data filed at 03/25/2024 2203 Gross per 24 hour  Intake --  Output 600 ml  Net -600 ml   Filed Weights   03/25/24 2248  Weight: 55.7 kg    Scheduled Meds:  acetaminophen   1,000 mg Oral Q8H   mupirocin   ointment  1 Application Nasal BID   Continuous Infusions:   ceFAZolin  (ANCEF ) IV     dextrose  5 % and 0.45 % NaCl 75 mL/hr at 03/26/24 1219   tranexamic acid       Nutritional status     Body mass index is 21.08 kg/m.  Data Reviewed:   CBC: Recent Labs  Lab 03/25/24 1751 03/26/24 0413  WBC 8.2 11.2*  NEUTROABS 5.7 8.7*  HGB 11.8* 10.8*  HCT 37.2 32.4*  MCV 99.2 94.5  PLT 217 216   Basic Metabolic Panel: Recent Labs  Lab 03/25/24 1751 03/26/24 0413  NA 135 135  K 3.9 4.3  CL 104 100  CO2 22 26  GLUCOSE 133* 144*  BUN 10 10  CREATININE 0.64 0.58  CALCIUM  9.6 10.1  MG 2.0 1.9    GFR: Estimated Creatinine Clearance: 33.9 mL/min (by C-G formula based on SCr of 0.58 mg/dL). Liver Function Tests: Recent Labs  Lab 03/25/24 1751 03/26/24 0413  AST 19 19  ALT 11 11  ALKPHOS 97 92  BILITOT 0.3 0.6  PROT 6.2* 5.9*  ALBUMIN  3.3* 3.1*   No results for input(s): LIPASE, AMYLASE in the last 168 hours. No results for input(s): AMMONIA in the last 168 hours. Coagulation Profile: Recent Labs  Lab 03/26/24 0413  INR 1.0   Cardiac Enzymes: No results for input(s): CKTOTAL, CKMB, CKMBINDEX, TROPONINI in the last 168 hours. BNP (last 3 results) No results for input(s): PROBNP in the last 8760 hours. HbA1C: No results for input(s): HGBA1C in the last 72 hours. CBG: No results for input(s): GLUCAP in the last 168 hours. Lipid Profile: No results for input(s): CHOL, HDL, LDLCALC, TRIG, CHOLHDL, LDLDIRECT in the last 72 hours. Thyroid  Function Tests: No results for input(s): TSH, T4TOTAL, FREET4, T3FREE, THYROIDAB in the last 72 hours. Anemia Panel: No results for input(s): VITAMINB12, FOLATE, FERRITIN, TIBC, IRON, RETICCTPCT in the last 72 hours. Sepsis Labs: No results for input(s): PROCALCITON, LATICACIDVEN in the last 168 hours.  Recent Results (from the past 240 hours)  Surgical PCR screen     Status: None   Collection Time: 03/26/24  3:52 AM   Specimen: Nasal Mucosa; Nasal Swab  Result Value Ref Range Status   MRSA, PCR NEGATIVE NEGATIVE Final   Staphylococcus aureus NEGATIVE NEGATIVE Final    Comment: (NOTE) The Xpert SA Assay (FDA approved for NASAL specimens in patients 60 years of age and older), is one component of a comprehensive surveillance program. It is not intended to diagnose infection nor to guide or monitor treatment. Performed at [facility], [address], [place], [place] [address]          Radiology Studies: DG FEMUR MIN 2 VIEWS LEFT Result Date:  03/26/2024 CLINICAL DATA:  Known hip fracture EXAM: LEFT FEMUR 2 VIEWS COMPARISON:  03/25/2024 FINDINGS: Distal femur shows no acute fracture. Mild degenerative changes of the knee joint are seen. No joint effusion is seen. IMPRESSION: No acute distal femoral abnormality is noted. Electronically Signed   By: Oneil Devonshire M.D.   On: 03/26/2024 03:42   CT Cervical Spine Wo Contrast Result Date: 03/25/2024 CLINICAL DATA:  Polytrauma, blunt.  Fall EXAM: CT HEAD WITHOUT CONTRAST CT CERVICAL SPINE WITHOUT CONTRAST TECHNIQUE: Multidetector CT imaging of the head and cervical spine was performed following the standard protocol without intravenous contrast. Multiplanar CT image reconstructions of the cervical spine were also generated. RADIATION DOSE REDUCTION: This exam was performed according to the departmental dose-optimization program  which includes automated exposure control, adjustment of the mA and/or kV according to patient size and/or use of iterative reconstruction technique. COMPARISON:  None Available. FINDINGS: CT HEAD FINDINGS Brain: Cerebral ventricle sizes are concordant with the degree of cerebral volume loss. Patchy and confluent areas of decreased attenuation are noted throughout the deep and periventricular white matter of the cerebral hemispheres bilaterally, compatible with chronic microvascular ischemic disease. No evidence of large-territorial acute infarction. No parenchymal hemorrhage. No mass lesion. No extra-axial collection. No mass effect or midline shift. No hydrocephalus. Basilar cisterns are patent. Vascular: No hyperdense vessel. Atherosclerotic calcifications are present within the cavernous internal carotid and vertebral arteries. Skull: No acute fracture or focal lesion. Sinuses/Orbits: Paranasal sinuses and mastoid air cells are clear. Bilateral lens replacement. Otherwise the orbits are unremarkable. Other: None. CT CERVICAL SPINE FINDINGS Alignment: Grade 1 anterolisthesis of C5 on  C6. Skull base and vertebrae: Multilevel moderate degenerative change of the spine. No acute fracture. No aggressive appearing focal osseous lesion or focal pathologic process. Soft tissues and spinal canal: No prevertebral fluid or swelling. No visible canal hematoma. Upper chest: Biapical pleural/pulmonary scarring. Paraseptal emphysematous changes. Other: Heterogeneous enlarged thyroid  gland. Atherosclerotic plaque of the carotid arteries. IMPRESSION: 1. No acute intracranial abnormality. 2. No acute displaced fracture or traumatic listhesis of the cervical spine. 3.  Aortic aneurysm NOS (ICD10-I71.9). 4. Multinodular goiter. In the setting of significant comorbidities or limited life expectancy, no follow-up recommended (ref: J Am Coll Radiol. 2015 Feb;12(2): 143-50). Electronically Signed   By: Morgane  Naveau M.D.   On: 03/25/2024 18:40   CT Head Wo Contrast Result Date: 03/25/2024 CLINICAL DATA:  Polytrauma, blunt.  Fall EXAM: CT HEAD WITHOUT CONTRAST CT CERVICAL SPINE WITHOUT CONTRAST TECHNIQUE: Multidetector CT imaging of the head and cervical spine was performed following the standard protocol without intravenous contrast. Multiplanar CT image reconstructions of the cervical spine were also generated. RADIATION DOSE REDUCTION: This exam was performed according to the departmental dose-optimization program which includes automated exposure control, adjustment of the mA and/or kV according to patient size and/or use of iterative reconstruction technique. COMPARISON:  None Available. FINDINGS: CT HEAD FINDINGS Brain: Cerebral ventricle sizes are concordant with the degree of cerebral volume loss. Patchy and confluent areas of decreased attenuation are noted throughout the deep and periventricular white matter of the cerebral hemispheres bilaterally, compatible with chronic microvascular ischemic disease. No evidence of large-territorial acute infarction. No parenchymal hemorrhage. No mass lesion. No  extra-axial collection. No mass effect or midline shift. No hydrocephalus. Basilar cisterns are patent. Vascular: No hyperdense vessel. Atherosclerotic calcifications are present within the cavernous internal carotid and vertebral arteries. Skull: No acute fracture or focal lesion. Sinuses/Orbits: Paranasal sinuses and mastoid air cells are clear. Bilateral lens replacement. Otherwise the orbits are unremarkable. Other: None. CT CERVICAL SPINE FINDINGS Alignment: Grade 1 anterolisthesis of C5 on C6. Skull base and vertebrae: Multilevel moderate degenerative change of the spine. No acute fracture. No aggressive appearing focal osseous lesion or focal pathologic process. Soft tissues and spinal canal: No prevertebral fluid or swelling. No visible canal hematoma. Upper chest: Biapical pleural/pulmonary scarring. Paraseptal emphysematous changes. Other: Heterogeneous enlarged thyroid  gland. Atherosclerotic plaque of the carotid arteries. IMPRESSION: 1. No acute intracranial abnormality. 2. No acute displaced fracture or traumatic listhesis of the cervical spine. 3.  Aortic aneurysm NOS (ICD10-I71.9). 4. Multinodular goiter. In the setting of significant comorbidities or limited life expectancy, no follow-up recommended (ref: J Am Coll Radiol. 2015 Feb;12(2): 143-50). Electronically Signed   By:  Morgane  Naveau M.D.   On: 03/25/2024 18:40   DG Hip Unilat With Pelvis 2-3 Views Left Result Date: 03/25/2024 CLINICAL DATA:  pain EXAM: DG HIP (WITH OR WITHOUT PELVIS) 2-3V LEFT COMPARISON:  None Available. FINDINGS: Acute displaced and comminuted left intertrochanteric femoral fracture. No acute displaced fracture or dislocation of the right hip. No acute displaced fracture or diastasis of the bones of the pelvis. Coarsely calcified pericentimeter lesion along the pelvis may represent a fibroid. there is no evidence of arthropathy or other focal bone abnormality. IMPRESSION: Acute displaced and comminuted left  intertrochanteric femoral fracture. Electronically Signed   By: Morgane  Naveau M.D.   On: 03/25/2024 18:32           LOS: 1 day   Time spent= 35 mins    Burgess JAYSON Dare, MD [facility]  If 7PM-7AM, please contact night-coverage  03/26/2024, 12:48 PM

## 2024-03-26 NOTE — Anesthesia Preprocedure Evaluation (Addendum)
 Anesthesia Evaluation  Patient identified by MRN, date of birth, ID band Patient awake    Reviewed: Allergy & Precautions, H&P , NPO status , Patient's Chart, lab work & pertinent test results, reviewed documented beta blocker date and time   Airway Mallampati: II  TM Distance: >3 FB Neck ROM: Full    Dental no notable dental hx. (+) Teeth Intact, Dental Advisory Given   Pulmonary COPD,  COPD inhaler, former smoker   Pulmonary exam normal breath sounds clear to auscultation       Cardiovascular hypertension, Pt. on medications and Pt. on home beta blockers  Rhythm:Regular Rate:Normal  ECHO 2023:  1. Left ventricular ejection fraction, by estimation, is 70 to 75%. The  left ventricle has hyperdynamic function. The left ventricle has no  regional wall motion abnormalities. Left ventricular diastolic parameters  are consistent with Grade II diastolic  dysfunction (pseudonormalization). Elevated left atrial pressure.   2. Right ventricular systolic function is normal. The right ventricular  size is normal. There is moderately elevated pulmonary artery systolic  pressure. The estimated right ventricular systolic pressure is 58.4 mmHg.   3. The mitral valve is normal in structure. Trivial mitral valve  regurgitation.   4. Tricuspid valve regurgitation is mild to moderate.   5. The aortic valve is tricuspid. There is mild calcification of the  aortic valve. Aortic valve regurgitation is mild. Aortic valve sclerosis  is present, with no evidence of aortic valve stenosis.   6. The inferior vena cava is normal in size with <50% respiratory  variability, suggesting right atrial pressure of 8 mmHg.   Comparison(s): Prior images reviewed side by side. The left ventricular  diastolic function is significantly worse. There is now evidence of  increased mean left atrial pressure, otherwise no significant change.     Neuro/Psych negative  neurological ROS  negative psych ROS   GI/Hepatic Neg liver ROS,GERD  Medicated,,  Endo/Other  negative endocrine ROS    Renal/GU negative Renal ROS  negative genitourinary   Musculoskeletal  (+) Arthritis , Osteoarthritis,    Abdominal   Peds  Hematology negative hematology ROS (+) Lab Results      Component                Value               Date                      WBC                      11.2 (H)            03/26/2024                HGB                      10.8 (L)            03/26/2024                HCT                      32.4 (L)            03/26/2024                MCV  94.5                03/26/2024                PLT                      216                 03/26/2024             Lab Results      Component                Value               Date                      NA                       135                 03/26/2024                K                        4.3                 03/26/2024                CO2                      26                  03/26/2024                GLUCOSE                  144 (H)             03/26/2024                BUN                      10                  03/26/2024                CREATININE               0.58                03/26/2024                CALCIUM                   10.1                03/26/2024                GFR                      68.82               10/31/2023                GFRNONAA                 >60  03/26/2024              Anesthesia Other Findings   Reproductive/Obstetrics negative OB ROS                              Anesthesia Physical Anesthesia Plan  ASA: 2  Anesthesia Plan: Spinal   Post-op Pain Management:    Induction: Intravenous  PONV Risk Score and Plan: 2 and Ondansetron , Dexamethasone , Treatment may vary due to age or medical condition and Propofol  infusion  Airway Management Planned: Natural Airway and Simple Face  Mask  Additional Equipment: None  Intra-op Plan:   Post-operative Plan:   Informed Consent: I have reviewed the patients History and Physical, chart, labs and discussed the procedure including the risks, benefits and alternatives for the proposed anesthesia with the patient or authorized representative who has indicated his/her understanding and acceptance.   Patient has DNR.  Discussed DNR with patient, Discussed DNR with power of attorney and Continue DNR.   Dental advisory given  Plan Discussed with: CRNA  Anesthesia Plan Comments:          Anesthesia Quick Evaluation

## 2024-03-26 NOTE — Anesthesia Procedure Notes (Signed)
 Spinal  Patient location during procedure: OR Start time: 03/26/2024 6:30 PM End time: 03/26/2024 6:40 PM Reason for block: surgical anesthesia Staffing Performed: anesthesiologist  Anesthesiologist: Epifanio Fallow, MD Performed by: Epifanio Fallow, MD Authorized by: Epifanio Fallow, MD   Preanesthetic Checklist Completed: patient identified, IV checked, risks and benefits discussed, surgical consent, monitors and equipment checked, pre-op evaluation and timeout performed Spinal Block Patient position: right lateral decubitus Prep: DuraPrep Patient monitoring: cardiac monitor, continuous pulse ox and blood pressure Approach: right paramedian Location: L3-4 Injection technique: single-shot Needle Needle type: Quincke  Needle gauge: 22 G Needle length: 9 cm Assessment Sensory level: T8 Events: CSF return Additional Notes Functioning IV was confirmed and monitors were applied. Sterile prep and drape, including hand hygiene and sterile gloves were used. The patient was positioned and the spine was prepped. The skin was anesthetized with lidocaine .  Free flow of clear CSF was obtained prior to injecting local anesthetic into the CSF.  The spinal needle aspirated freely following injection.  The needle was carefully withdrawn.  The patient tolerated the procedure well.

## 2024-03-27 ENCOUNTER — Encounter (HOSPITAL_COMMUNITY): Payer: Self-pay | Admitting: Orthopedic Surgery

## 2024-03-27 DIAGNOSIS — S72102K Unspecified trochanteric fracture of left femur, subsequent encounter for closed fracture with nonunion: Secondary | ICD-10-CM | POA: Diagnosis not present

## 2024-03-27 LAB — CBC
HCT: 20 % — ABNORMAL LOW (ref 36.0–46.0)
Hemoglobin: 6.8 g/dL — CL (ref 12.0–15.0)
MCH: 32.4 pg (ref 26.0–34.0)
MCHC: 34 g/dL (ref 30.0–36.0)
MCV: 95.2 fL (ref 80.0–100.0)
Platelets: 124 K/uL — ABNORMAL LOW (ref 150–400)
RBC: 2.1 MIL/uL — ABNORMAL LOW (ref 3.87–5.11)
RDW: 12.4 % (ref 11.5–15.5)
WBC: 8.8 K/uL (ref 4.0–10.5)
nRBC: 0 % (ref 0.0–0.2)

## 2024-03-27 LAB — BASIC METABOLIC PANEL WITH GFR
Anion gap: 6 (ref 5–15)
BUN: 11 mg/dL (ref 8–23)
CO2: 24 mmol/L (ref 22–32)
Calcium: 9 mg/dL (ref 8.9–10.3)
Chloride: 102 mmol/L (ref 98–111)
Creatinine, Ser: 0.63 mg/dL (ref 0.44–1.00)
GFR, Estimated: >60 mL/min (ref >60)
Glucose, Bld: 157 mg/dL — ABNORMAL HIGH (ref 70–99)
Potassium: 3.5 mmol/L (ref 3.5–5.1)
Sodium: 132 mmol/L — ABNORMAL LOW (ref 135–145)

## 2024-03-27 LAB — HEMOGLOBIN AND HEMATOCRIT, BLOOD
HCT: 23.4 % — ABNORMAL LOW (ref 36.0–46.0)
Hemoglobin: 7.8 g/dL — ABNORMAL LOW (ref 12.0–15.0)

## 2024-03-27 LAB — PREPARE RBC (CROSSMATCH)

## 2024-03-27 LAB — PHOSPHORUS: Phosphorus: 2.2 mg/dL — ABNORMAL LOW (ref 2.5–4.6)

## 2024-03-27 LAB — MAGNESIUM: Magnesium: 1.7 mg/dL (ref 1.7–2.4)

## 2024-03-27 MED ORDER — SOTALOL HCL 80 MG PO TABS
80.0000 mg | ORAL_TABLET | Freq: Two times a day (BID) | ORAL | Status: DC
  Administered 2024-03-27 – 2024-04-01 (×8): 80 mg via ORAL
  Filled 2024-03-27 (×12): qty 1

## 2024-03-27 MED ORDER — FAMOTIDINE 20 MG PO TABS
20.0000 mg | ORAL_TABLET | Freq: Two times a day (BID) | ORAL | Status: DC
  Administered 2024-03-27 – 2024-04-01 (×8): 20 mg via ORAL
  Filled 2024-03-27 (×9): qty 1

## 2024-03-27 MED ORDER — KETOROLAC TROMETHAMINE 15 MG/ML IJ SOLN
15.0000 mg | Freq: Three times a day (TID) | INTRAMUSCULAR | Status: AC
  Administered 2024-03-27 – 2024-03-28 (×3): 15 mg via INTRAVENOUS
  Filled 2024-03-27 (×5): qty 1

## 2024-03-27 MED ORDER — MIRTAZAPINE 15 MG PO TABS
15.0000 mg | ORAL_TABLET | Freq: Every day | ORAL | Status: DC
  Filled 2024-03-27: qty 1

## 2024-03-27 MED ORDER — TRAMADOL HCL 50 MG PO TABS
50.0000 mg | ORAL_TABLET | Freq: Two times a day (BID) | ORAL | Status: DC
  Administered 2024-03-31: 50 mg via ORAL
  Filled 2024-03-27 (×4): qty 1

## 2024-03-27 MED ORDER — SIMETHICONE 40 MG/0.6ML PO SUSP: 40.0000 mg | Freq: Two times a day (BID) | ORAL | 99 refills | Status: AC

## 2024-03-27 MED ORDER — POLYVINYL ALCOHOL 1.4 % OP SOLN
1.0000 [drp] | Freq: Four times a day (QID) | OPHTHALMIC | Status: DC
  Administered 2024-03-27 – 2024-04-01 (×16): 1 [drp] via OPHTHALMIC
  Filled 2024-03-27: qty 15

## 2024-03-27 MED ORDER — LIDOCAINE 5 % EX PTCH
2.0000 | MEDICATED_PATCH | CUTANEOUS | Status: DC
  Administered 2024-03-27 – 2024-03-28 (×2): 2 via TRANSDERMAL
  Filled 2024-03-27 (×2): qty 2

## 2024-03-27 MED ORDER — SODIUM CHLORIDE 0.9% IV SOLUTION: Freq: Once | INTRAVENOUS | Status: AC

## 2024-03-27 MED ORDER — DM-GUAIFENESIN ER 30-600 MG PO TB12: 1.0000 | ORAL_TABLET | Freq: Two times a day (BID) | ORAL | Status: DC | PRN

## 2024-03-27 MED ORDER — TIMOLOL MALEATE 0.25 % OP SOLN
1.0000 [drp] | Freq: Every day | OPHTHALMIC | Status: DC
  Administered 2024-03-27 – 2024-04-01 (×6): 1 [drp] via OPHTHALMIC
  Filled 2024-03-27 (×2): qty 5

## 2024-03-27 MED ORDER — CLOBETASOL PROPIONATE 0.05 % EX OINT: 1.0000 | TOPICAL_OINTMENT | Freq: Two times a day (BID) | CUTANEOUS | Status: DC | PRN

## 2024-03-27 MED ORDER — HYDROCORTISONE (PERIANAL) 2.5 % EX CREA: 1.0000 | TOPICAL_CREAM | Freq: Two times a day (BID) | CUTANEOUS | Status: DC | PRN

## 2024-03-27 MED ORDER — ROSUVASTATIN CALCIUM 20 MG PO TABS
20.0000 mg | ORAL_TABLET | Freq: Every day | ORAL | Status: DC
  Administered 2024-03-27 – 2024-04-01 (×5): 20 mg via ORAL
  Filled 2024-03-27 (×5): qty 1

## 2024-03-27 MED ORDER — POTASSIUM PHOSPHATES 15 MMOLE/5ML IV SOLN
30.0000 mmol | Freq: Once | INTRAVENOUS | Status: AC
  Administered 2024-03-27: 30 mmol via INTRAVENOUS
  Filled 2024-03-27: qty 10

## 2024-03-27 MED ORDER — OXYCODONE HCL 5 MG PO TABS
5.0000 mg | ORAL_TABLET | ORAL | Status: DC
  Administered 2024-03-27 – 2024-03-29 (×4): 5 mg via ORAL
  Filled 2024-03-27 (×5): qty 1

## 2024-03-27 NOTE — Progress Notes (Signed)
 Blood transfusion started at 1225 pm. No reaction noted. RN is at bed side. Will continue to monitor through this shift.

## 2024-03-27 NOTE — Progress Notes (Signed)
 Blood is being transfused. No reaction noted. RN at bed side. Increased the rate to 164ml/h. Will continue to monitor through this shift

## 2024-03-27 NOTE — Progress Notes (Signed)
 PROGRESS NOTE    Linda [name]  FMW:[id number] DOB: 03/09/1924 DOA: 03/25/2024 PCP: Kennyth Worth HERO, MD    Brief Narrative:   89 y.o. female with medical history significant for COPD, essential hypertension, hyperlipidemia, who is admitted to [facility] on 03/25/2024 with acute left intertrochanteric femur fracture after ground-level mechanical fall at assisted living after presenting from home to [facility] ED complaining of fall. Status post ORIF 8/13.  Postop management, wound care, pain control, postop therapy recommendations per their service.    Assessment & Plan:  Principal Problem:   Closed left hip fracture (HCC) Active Problems:   HLD (hyperlipidemia)   Fall at home, initial encounter   Acute hip pain, left   History of COPD   History of essential hypertension   Mechanical fall with acute left-sided intertrochanteric femur fracture - Status post ORIF 8/13.  Postop management, wound care, pain control, postop therapy recommendations per their service.  -PT/OT  Acute blood loss anemia, postop vs dilutional.  - Hemoglobin baseline 10.8, today 6.8.  1 unit PRBC ordered.  Posttransfusion H&H.  Hypophosphatemia -replete prn  COPD - As needed bronchodilators  Essential hypertension - IV as needed  Hyperlipidemia -Currently on hold  Concerns of urinary retention, continues to show urinary retention.  Patient has already been straight cathed 3 times.  If this continues, will order Foley catheter   DVT prophylaxis: SCDs Start: 03/25/24 2017    Code Status: Limited: Do not attempt resuscitation (DNR) -DNR-LIMITED -Do Not Intubate/DNI  Family Communication: Daughter present at bedside Status is: Inpatient Remains inpatient appropriate because: Therapy evaluation  Subjective: Some hematuria overnight likely from Foley catheter.  Also reporting of some lower back pain and irritability likely from surgical pain.  Denies any nausea, vomiting. Daughter is present at  bedside   Examination:  General exam: Appears calm and comfortable, elderly frail and cachectic Respiratory system: Clear to auscultation. Respiratory effort normal. Cardiovascular system: S1 & S2 heard, RRR. No JVD, murmurs, rubs, gallops or clicks. No pedal edema. Gastrointestinal system: Abdomen is nondistended, soft and nontender. No organomegaly or masses felt. Normal bowel sounds heard. Central nervous system: Alert and oriented. No focal neurological deficits. Extremities: Symmetric 5 x 5 power. Skin: Surgical dressing noted Psychiatry: Judgement and insight appear normal. Mood & affect appropriate.                Diet Orders (From admission, onward)     Start     Ordered   03/26/24 2136  Diet regular Room service appropriate? Yes; Fluid consistency: Thin  Diet effective now       Question Answer Comment  Room service appropriate? Yes   Fluid consistency: Thin      03/26/24 2135            Objective: Vitals:   03/27/24 0746 03/27/24 1208 03/27/24 1215 03/27/24 1218  BP: (!) 118/41 (!) 113/37    Pulse: 87 77    Resp: 18 18    Temp: 98.7 F (37.1 C) 98 F (36.7 C)  98 F (36.7 C)  TempSrc: Oral Oral    SpO2: 100%  98%   Weight:      Height:        Intake/Output Summary (Last 24 hours) at 03/27/2024 1229 Last data filed at 03/27/2024 0410 Gross per 24 hour  Intake 1054.81 ml  Output 1350 ml  Net -295.19 ml   Filed Weights   03/25/24 2248  Weight: 55.7 kg    Scheduled Meds:  sodium chloride    Intravenous Once   acetaminophen   1,000 mg Oral Q8H   Carboxymethylcellulose Sodium   Both Eyes QID   famotidine   20 mg Oral BID   ketorolac   15 mg Intravenous Q8H   lidocaine   2 patch Transdermal Q24H   mirtazapine   15 mg Oral QHS   mupirocin  ointment  1 Application Nasal BID   oxyCODONE   5 mg Oral Q4H   rosuvastatin   20 mg Oral Daily   sotalol   80 mg Oral Q12H   timolol   1 drop Both Eyes Daily   traMADol   50 mg Oral BID   Continuous  Infusions:  potassium PHOSPHATE  IVPB (in mmol) 30 mmol (03/27/24 1022)    Nutritional status     Body mass index is 21.08 kg/m.  Data Reviewed:   CBC: Recent Labs  Lab 03/25/24 1751 03/26/24 0413 03/27/24 0537  WBC 8.2 11.2* 8.8  NEUTROABS 5.7 8.7*  --   HGB 11.8* 10.8* 6.8*  HCT 37.2 32.4* 20.0*  MCV 99.2 94.5 95.2  PLT 217 216 124*   Basic Metabolic Panel: Recent Labs  Lab 03/25/24 1751 03/26/24 0413 03/27/24 0537  NA 135 135 132*  K 3.9 4.3 3.5  CL 104 100 102  CO2 22 26 24   GLUCOSE 133* 144* 157*  BUN 10 10 11   CREATININE 0.64 0.58 0.63  CALCIUM  9.6 10.1 9.0  MG 2.0 1.9 1.7  PHOS  --   --  2.2*   GFR: Estimated Creatinine Clearance: 33.9 mL/min (by C-G formula based on SCr of 0.63 mg/dL). Liver Function Tests: Recent Labs  Lab 03/25/24 1751 03/26/24 0413  AST 19 19  ALT 11 11  ALKPHOS 97 92  BILITOT 0.3 0.6  PROT 6.2* 5.9*  ALBUMIN  3.3* 3.1*   No results for input(s): LIPASE, AMYLASE in the last 168 hours. No results for input(s): AMMONIA in the last 168 hours. Coagulation Profile: Recent Labs  Lab 03/26/24 0413  INR 1.0   Cardiac Enzymes: No results for input(s): CKTOTAL, CKMB, CKMBINDEX, TROPONINI in the last 168 hours. BNP (last 3 results) No results for input(s): PROBNP in the last 8760 hours. HbA1C: No results for input(s): HGBA1C in the last 72 hours. CBG: No results for input(s): GLUCAP in the last 168 hours. Lipid Profile: No results for input(s): CHOL, HDL, LDLCALC, TRIG, CHOLHDL, LDLDIRECT in the last 72 hours. Thyroid  Function Tests: No results for input(s): TSH, T4TOTAL, FREET4, T3FREE, THYROIDAB in the last 72 hours. Anemia Panel: No results for input(s): VITAMINB12, FOLATE, FERRITIN, TIBC, IRON, RETICCTPCT in the last 72 hours. Sepsis Labs: No results for input(s): PROCALCITON, LATICACIDVEN in the last 168 hours.  Recent Results (from the past 240 hours)   Surgical PCR screen     Status: None   Collection Time: 03/26/24  3:52 AM   Specimen: Nasal Mucosa; Nasal Swab  Result Value Ref Range Status   MRSA, PCR NEGATIVE NEGATIVE Final   Staphylococcus aureus NEGATIVE NEGATIVE Final    Comment: (NOTE) The Xpert SA Assay (FDA approved for NASAL specimens in patients 14 years of age and older), is one component of a comprehensive surveillance program. It is not intended to diagnose infection nor to guide or monitor treatment. Performed at [facility], [address] [place], [place] [address]          Radiology Studies: DG FEMUR MIN 2 VIEWS LEFT Result Date: 03/27/2024 CLINICAL DATA:  Left hip ORIF, postoperative examination EXAM: LEFT FEMUR 2 VIEWS COMPARISON:  None Available. FINDINGS: Two view radiograph left femur demonstrates surgical changes of left hip ORIF with a intramedullary rod with proximal and distal interlocking screws. Fracture fragments are in near anatomic alignment. No unexpected fracture or dislocation. Mild left hip degenerative arthritis. Avulsion of the lesser trochanter again noted with moderate distraction of fracture fragments by up to 1 cm. Gas and surgical staples are seen within the lateral soft tissues proximally and distally. IMPRESSION: 1. Left hip ORIF with fracture fragments in near anatomic alignment. Electronically Signed   By: Dorethia Molt M.D.   On: 03/27/2024 01:05   DG FEMUR MIN 2 VIEWS LEFT Result Date: 03/26/2024 CLINICAL DATA:  Elective surgery. EXAM: LEFT FEMUR 2 VIEWS COMPARISON:  Hip radiograph yesterday FINDINGS: Six fluoroscopic spot views of the femur submitted from the operating room. Femoral intramedullary nail with trans trochanteric and distal locking screw fixation traverse proximal femur fracture. Fluoroscopy time 126.5 seconds. Dose 11.09 mGy IMPRESSION: Intraoperative fluoroscopy during proximal femur fracture ORIF. Electronically Signed   By: Andrea Gasman M.D.   On: 03/26/2024  20:46   DG C-Arm 1-60 Min-No Report Result Date: 03/26/2024 Fluoroscopy was utilized by the requesting physician.  No radiographic interpretation.   DG C-Arm 1-60 Min-No Report Result Date: 03/26/2024 Fluoroscopy was utilized by the requesting physician.  No radiographic interpretation.   DG FEMUR MIN 2 VIEWS LEFT Result Date: 03/26/2024 CLINICAL DATA:  Known hip fracture EXAM: LEFT FEMUR 2 VIEWS COMPARISON:  03/25/2024 FINDINGS: Distal femur shows no acute fracture. Mild degenerative changes of the knee joint are seen. No joint effusion is seen. IMPRESSION: No acute distal femoral abnormality is noted. Electronically Signed   By: Oneil Devonshire M.D.   On: 03/26/2024 03:42   CT Cervical Spine Wo Contrast Result Date: 03/25/2024 CLINICAL DATA:  Polytrauma, blunt.  Fall EXAM: CT HEAD WITHOUT CONTRAST CT CERVICAL SPINE WITHOUT CONTRAST TECHNIQUE: Multidetector CT imaging of the head and cervical spine was performed following the standard protocol without intravenous contrast. Multiplanar CT image reconstructions of the cervical spine were also generated. RADIATION DOSE REDUCTION: This exam was performed according to the departmental dose-optimization program which includes automated exposure control, adjustment of the mA and/or kV according to patient size and/or use of iterative reconstruction technique. COMPARISON:  None Available. FINDINGS: CT HEAD FINDINGS Brain: Cerebral ventricle sizes are concordant with the degree of cerebral volume loss. Patchy and confluent areas of decreased attenuation are noted throughout the deep and periventricular white matter of the cerebral hemispheres bilaterally, compatible with chronic microvascular ischemic disease. No evidence of large-territorial acute infarction. No parenchymal hemorrhage. No mass lesion. No extra-axial collection. No mass effect or midline shift. No hydrocephalus. Basilar cisterns are patent. Vascular: No hyperdense vessel. Atherosclerotic  calcifications are present within the cavernous internal carotid and vertebral arteries. Skull: No acute fracture or focal lesion. Sinuses/Orbits: Paranasal sinuses and mastoid air cells are clear. Bilateral lens replacement. Otherwise the orbits are unremarkable. Other: None. CT CERVICAL SPINE FINDINGS Alignment: Grade 1 anterolisthesis of C5 on C6. Skull base and vertebrae: Multilevel moderate degenerative change of the spine. No acute fracture. No aggressive appearing focal osseous lesion or focal pathologic process. Soft tissues and spinal canal: No prevertebral fluid or swelling. No visible canal hematoma. Upper chest: Biapical pleural/pulmonary scarring. Paraseptal emphysematous changes. Other: Heterogeneous enlarged thyroid  gland. Atherosclerotic plaque of the carotid arteries. IMPRESSION: 1. No acute intracranial abnormality. 2. No acute displaced fracture or traumatic listhesis of the cervical spine. 3.  Aortic aneurysm NOS (ICD10-I71.9). 4. Multinodular goiter. In  the setting of significant comorbidities or limited life expectancy, no follow-up recommended (ref: J Am Coll Radiol. 2015 Feb;12(2): 143-50). Electronically Signed   By: Morgane  Naveau M.D.   On: 03/25/2024 18:40   CT Head Wo Contrast Result Date: 03/25/2024 CLINICAL DATA:  Polytrauma, blunt.  Fall EXAM: CT HEAD WITHOUT CONTRAST CT CERVICAL SPINE WITHOUT CONTRAST TECHNIQUE: Multidetector CT imaging of the head and cervical spine was performed following the standard protocol without intravenous contrast. Multiplanar CT image reconstructions of the cervical spine were also generated. RADIATION DOSE REDUCTION: This exam was performed according to the departmental dose-optimization program which includes automated exposure control, adjustment of the mA and/or kV according to patient size and/or use of iterative reconstruction technique. COMPARISON:  None Available. FINDINGS: CT HEAD FINDINGS Brain: Cerebral ventricle sizes are concordant with  the degree of cerebral volume loss. Patchy and confluent areas of decreased attenuation are noted throughout the deep and periventricular white matter of the cerebral hemispheres bilaterally, compatible with chronic microvascular ischemic disease. No evidence of large-territorial acute infarction. No parenchymal hemorrhage. No mass lesion. No extra-axial collection. No mass effect or midline shift. No hydrocephalus. Basilar cisterns are patent. Vascular: No hyperdense vessel. Atherosclerotic calcifications are present within the cavernous internal carotid and vertebral arteries. Skull: No acute fracture or focal lesion. Sinuses/Orbits: Paranasal sinuses and mastoid air cells are clear. Bilateral lens replacement. Otherwise the orbits are unremarkable. Other: None. CT CERVICAL SPINE FINDINGS Alignment: Grade 1 anterolisthesis of C5 on C6. Skull base and vertebrae: Multilevel moderate degenerative change of the spine. No acute fracture. No aggressive appearing focal osseous lesion or focal pathologic process. Soft tissues and spinal canal: No prevertebral fluid or swelling. No visible canal hematoma. Upper chest: Biapical pleural/pulmonary scarring. Paraseptal emphysematous changes. Other: Heterogeneous enlarged thyroid  gland. Atherosclerotic plaque of the carotid arteries. IMPRESSION: 1. No acute intracranial abnormality. 2. No acute displaced fracture or traumatic listhesis of the cervical spine. 3.  Aortic aneurysm NOS (ICD10-I71.9). 4. Multinodular goiter. In the setting of significant comorbidities or limited life expectancy, no follow-up recommended (ref: J Am Coll Radiol. 2015 Feb;12(2): 143-50). Electronically Signed   By: Morgane  Naveau M.D.   On: 03/25/2024 18:40   DG Hip Unilat With Pelvis 2-3 Views Left Result Date: 03/25/2024 CLINICAL DATA:  pain EXAM: DG HIP (WITH OR WITHOUT PELVIS) 2-3V LEFT COMPARISON:  None Available. FINDINGS: Acute displaced and comminuted left intertrochanteric femoral  fracture. No acute displaced fracture or dislocation of the right hip. No acute displaced fracture or diastasis of the bones of the pelvis. Coarsely calcified pericentimeter lesion along the pelvis may represent a fibroid. there is no evidence of arthropathy or other focal bone abnormality. IMPRESSION: Acute displaced and comminuted left intertrochanteric femoral fracture. Electronically Signed   By: Morgane  Naveau M.D.   On: 03/25/2024 18:32           LOS: 2 days   Time spent= 35 mins    Burgess JAYSON Dare, MD [facility]  If 7PM-7AM, please contact night-coverage  03/27/2024, 12:29 PM

## 2024-03-27 NOTE — Progress Notes (Addendum)
 Orthopedic Surgery Progress Note   Assessment: Patient is a 89 y.o. female with left pertrochanteric femur fracture status post CMN   Plan: -Operative plans: complete -Diet: regular -Antibiotics: ancef  x2 post-op doses -Weight bearing status: as tolerated -PT evaluate and treat -Added ensure to help with wound healing -Pain control - scheduled the oxycodone  since patient's daughter said the patient will not ask and then will not mobilize -Dispo: per primary  ___________________________________________________________________________  Subjective: Was complaining of sore throat and difficulty swallowing last night. Sleeping comfortably this morning. Patient's daughter said she was complaining more of back pain than hip pain prior to falling asleep this morning.     Physical Exam:  General: no acute distress, appears stated age Neurologic: sleeping comfortably Respiratory: unlabored breathing on room air, symmetric chest rise  MSK:   -Left lower extremity  Dressings over thigh c/d/i Foot warm and well perfused   Yesterday's total administered Morphine  Milligram Equivalents: 37.5   Patient name: Linda [name] Patient MRN: [id number] Date: 03/27/24

## 2024-03-27 NOTE — Progress Notes (Signed)
 Blood transfusion completed at 1451 pm. RN at bed side . No reaction noted. Will continue to monitor through out this shift

## 2024-03-28 ENCOUNTER — Inpatient Hospital Stay (HOSPITAL_COMMUNITY)

## 2024-03-28 DIAGNOSIS — I6389 Other cerebral infarction: Secondary | ICD-10-CM | POA: Diagnosis not present

## 2024-03-28 DIAGNOSIS — S72102K Unspecified trochanteric fracture of left femur, subsequent encounter for closed fracture with nonunion: Secondary | ICD-10-CM | POA: Diagnosis not present

## 2024-03-28 LAB — URINALYSIS, ROUTINE W REFLEX MICROSCOPIC
Bilirubin Urine: NEGATIVE
Glucose, UA: NEGATIVE mg/dL
Ketones, ur: NEGATIVE mg/dL
Nitrite: NEGATIVE
Protein, ur: 30 mg/dL — AB
Specific Gravity, Urine: 1.017 (ref 1.005–1.030)
pH: 5 (ref 5.0–8.0)

## 2024-03-28 LAB — TYPE AND SCREEN
ABO/RH(D): O POS
Antibody Screen: NEGATIVE
Unit division: 0

## 2024-03-28 LAB — TSH: TSH: 1.073 u[IU]/mL (ref 0.350–4.500)

## 2024-03-28 LAB — CBC
HCT: 23.5 % — ABNORMAL LOW (ref 36.0–46.0)
Hemoglobin: 8.1 g/dL — ABNORMAL LOW (ref 12.0–15.0)
MCH: 31.4 pg (ref 26.0–34.0)
MCHC: 34.5 g/dL (ref 30.0–36.0)
MCV: 91.1 fL (ref 80.0–100.0)
Platelets: 122 K/uL — ABNORMAL LOW (ref 150–400)
RBC: 2.58 MIL/uL — ABNORMAL LOW (ref 3.87–5.11)
RDW: 15 % (ref 11.5–15.5)
WBC: 10.8 K/uL — ABNORMAL HIGH (ref 4.0–10.5)
nRBC: 0 % (ref 0.0–0.2)

## 2024-03-28 LAB — BASIC METABOLIC PANEL WITH GFR
Anion gap: 5 (ref 5–15)
BUN: 12 mg/dL (ref 8–23)
CO2: 26 mmol/L (ref 22–32)
Calcium: 9.1 mg/dL (ref 8.9–10.3)
Chloride: 99 mmol/L (ref 98–111)
Creatinine, Ser: 0.61 mg/dL (ref 0.44–1.00)
GFR, Estimated: >60 mL/min (ref >60)
Glucose, Bld: 133 mg/dL — ABNORMAL HIGH (ref 70–99)
Potassium: 4 mmol/L (ref 3.5–5.1)
Sodium: 130 mmol/L — ABNORMAL LOW (ref 135–145)

## 2024-03-28 LAB — BPAM RBC
Blood Product Expiration Date: 202509112359
ISSUE DATE / TIME: 202508141200
Unit Type and Rh: 5100

## 2024-03-28 LAB — MAGNESIUM: Magnesium: 1.8 mg/dL (ref 1.7–2.4)

## 2024-03-28 LAB — AMMONIA: Ammonia: <13 umol/L (ref 9–35)

## 2024-03-28 LAB — VITAMIN B12: Vitamin B-12: 174 pg/mL — ABNORMAL LOW (ref 180–914)

## 2024-03-28 MED ORDER — STROKE: EARLY STAGES OF RECOVERY BOOK
Freq: Once | Status: AC
  Filled 2024-03-28: qty 1

## 2024-03-28 MED ORDER — ENSURE PLUS HIGH PROTEIN PO LIQD
237.0000 mL | Freq: Two times a day (BID) | ORAL | Status: DC
  Administered 2024-03-29 – 2024-04-01 (×6): 237 mL via ORAL

## 2024-03-28 MED ORDER — VITAMIN B-12 1000 MCG PO TABS
1000.0000 ug | ORAL_TABLET | Freq: Every day | ORAL | Status: DC
  Administered 2024-03-29 – 2024-04-01 (×4): 1000 ug via ORAL
  Filled 2024-03-28 (×4): qty 1

## 2024-03-28 MED ORDER — ORAL CARE MOUTH RINSE: 15.0000 mL | OROMUCOSAL | Status: DC | PRN

## 2024-03-28 MED ORDER — CHLORHEXIDINE GLUCONATE CLOTH 2 % EX PADS
6.0000 | MEDICATED_PAD | Freq: Every day | CUTANEOUS | Status: DC
  Administered 2024-03-28 – 2024-04-01 (×3): 6 via TOPICAL

## 2024-03-28 MED ORDER — ORAL CARE MOUTH RINSE
15.0000 mL | OROMUCOSAL | Status: DC
  Administered 2024-03-28 – 2024-04-01 (×10): 15 mL via OROMUCOSAL

## 2024-03-28 MED ORDER — DEXTROSE-SODIUM CHLORIDE 5-0.45 % IV SOLN: INTRAVENOUS | Status: AC

## 2024-03-28 NOTE — Evaluation (Signed)
 Clinical/Bedside Swallow Evaluation Patient Details  Name: Linda [name] MRN: [id number] Date of Birth: [birth year]  Today's Date: 03/28/2024 Time: SLP Start Time (ACUTE ONLY): 1447 SLP Stop Time (ACUTE ONLY): 1532 SLP Time Calculation (min) (ACUTE ONLY): 45 min  Past Medical History:  Past Medical History:  Diagnosis Date   Arthritis    COPD (chronic obstructive pulmonary disease) (HCC)    chronic cough   Glaucoma    Glaucoma    Hyperlipidemia    Hypertension    Irregular heartbeat    Osteoarthritis    Osteoporosis    Stroke (HCC)    Thyroid  disease    enlarged thyroid    Past Surgical History:  Past Surgical History:  Procedure Laterality Date   APPENDECTOMY     cateract surgery     COLONOSCOPY     Done in [place]  yrs ago   INTRAMEDULLARY (IM) NAIL INTERTROCHANTERIC Left 03/26/2024   Procedure: FIXATION, FRACTURE, INTERTROCHANTERIC, WITH INTRAMEDULLARY, LEFT;  Surgeon: Georgina Ozell LABOR, MD;  Location: [facility] OR;  Service: Orthopedics;  Laterality: Left;   UPPER GASTROINTESTINAL ENDOSCOPY  2019   WRIST SURGERY     HPI:  89 yo female admitted 8/12 after ground level mechanical fall at ALF resulting in acute left intertrochanteric femur fracture, s/p ORIF 8/13. Pt with increased decreased level of alertness prompting MRI 8/15 that revealed patchy acute infarcts in the anterior L frontal lobe matter, ACA/MCA watershed area. PMH includes: COPD, CVA, thyroid  disease. Previous SLP swallow evals x2 in February 2023 both suspicious for esophageal dysphagia with functional appearing oropharyngeal swallow. Esophagram in 2023 revealed mild esophageal dysmotility, which was not unexpected for age, but did result in some retention of contrast that resolved  with subsequent swallows.    Assessment / Plan / Recommendation  Clinical Impression  Pt has reportedly been very lethargic today but was able to be aroused for swallow eval. She does perseverate on motor tasks during oral motor exam and  initiation is quite limited for self-feeding and bolus acceptance. It takes pretty heavy cueing including hand-over-hand assist for self-feeding to get her to take any POs into her oral cavity. She did best with cup sips of thin liquids and spoonfuls of puree, but this was still limited to a few boluses of each. She does swallow what she gets into her mouth, with multiple swallows observed but no overt coughing. Her daughter saw some coughing on thin liquids while here in the hospital, and note that pt could not be well challenged with larger volumes or faster rates given mentation.   Education offered to daughter and friend about current presentation. Could consider offering her sips of thin liquids and bites of purees, but with mentation at the moment, she would likely get very little intake. Would have to provide careful assistance, only offering POs if pt is actively making an attempt to get them from the spoon/cup, and would hold if coughing, wet vocal quality, or change in respirations are noted. If mentation remains the same, would be at risk for inadequate nutrition and hydration. Risk for aspiration related adverse events are also increased given limited mobility and dependence for feeding/oral care. Will continue to follow to see if she has improvements in her ability to take POs or may want to consider overall GOC.   SLP Visit Diagnosis: Dysphagia, unspecified (R13.10)    Aspiration Risk       Diet Recommendation Dysphagia 1 (Puree);Thin liquid    Liquid Administration via: Cup;Straw Medication Administration: Crushed with puree  Supervision: Staff to assist with self feeding;Full supervision/cueing for compensatory strategies Compensations: Slow rate;Small sips/bites;Minimize environmental distractions Postural Changes: Seated upright at 90 degrees;Remain upright for at least 30 minutes after po intake    Other  Recommendations Oral Care Recommendations: Oral care BID     Assistance  Recommended at Discharge    Functional Status Assessment Patient has had a recent decline in their functional status and demonstrates the ability to make significant improvements in function in a reasonable and predictable amount of time.  Frequency and Duration min 2x/week  2 weeks       Prognosis Prognosis for improved oropharyngeal function: Good Barriers to Reach Goals: Cognitive deficits      Swallow Study   General HPI: 89 yo female admitted 8/12 after ground level mechanical fall at ALF resulting in acute left intertrochanteric femur fracture, s/p ORIF 8/13. Pt with increased decreased level of alertness prompting MRI 8/15 that revealed patchy acute infarcts in the anterior L frontal lobe matter, ACA/MCA watershed area. PMH includes: COPD, CVA, thyroid  disease. Previous SLP swallow evals x2 in February 2023 both suspicious for esophageal dysphagia with functional appearing oropharyngeal swallow. Esophagram in 2023 revealed mild esophageal dysmotility, which was not unexpected for age, but did result in some retention of contrast that resolved  with subsequent swallows. Type of Study: Bedside Swallow Evaluation Previous Swallow Assessment: see HPI Diet Prior to this Study: Regular;Thin liquids (Level 0) Temperature Spikes Noted: No Respiratory Status: Nasal cannula History of Recent Intubation: No Behavior/Cognition: Alert;Cooperative;Requires cueing Oral Cavity Assessment: Within Functional Limits Oral Care Completed by SLP: No Oral Cavity - Dentition: Adequate natural dentition Self-Feeding Abilities: Total assist Patient Positioning: Upright in bed Baseline Vocal Quality: Normal Volitional Cough: Cognitively unable to elicit Volitional Swallow: Unable to elicit    Oral/Motor/Sensory Function Overall Oral Motor/Sensory Function: Other (comment) (Limited command following - no overt asymmetry noted)   Ice Chips Ice chips: Not tested   Thin Liquid Thin Liquid:  Impaired Presentation: Cup;Self Fed;Straw Oral Phase Impairments: Poor awareness of bolus Pharyngeal  Phase Impairments: Multiple swallows    Nectar Thick Nectar Thick Liquid: Not tested   Honey Thick Honey Thick Liquid: Not tested   Puree Puree: Impaired Presentation: Spoon Oral Phase Impairments: Poor awareness of bolus   Solid     Solid: Not tested      Leita SAILOR., M.A. CCC-SLP Acute Rehabilitation Services Office: [phone]  Secure chat preferred  03/28/2024,3:44 PM

## 2024-03-28 NOTE — Progress Notes (Shared)
 Pt has been drowsy unable to swallow her oral medication. Provider notified. Will continue to monitor.

## 2024-03-28 NOTE — Progress Notes (Addendum)
 PT Cancellation Note  Patient Details Name: Linda [name] MRN: [id number] DOB: 10-03-1923   Cancelled Treatment:    Reason Eval/Treat Not Completed: Patient's level of consciousness;Medical issues which prohibited therapy. Pt currently with decreased LOC, laying in bed with occasional L eye opening. Pt unable to maintain eyes open or follow commands at this time. Will check back as schedule allows and initiate PT evaluation when pt is medically ready to participate.    Leita JONETTA Sable 03/28/2024, 10:05 AM   Addendum: 1:00 PM - Noted MRI was positive for patchy acute infarcts in the ACA/MCA watershed area. Neuro consulted. Will hold PT evaluation this date and check back at next available date to assess medical readiness to participate.    Leita Sable, PT, DPT Acute Rehabilitation Services Secure Chat Preferred Office: [phone]

## 2024-03-28 NOTE — Progress Notes (Signed)
 NEUROLOGY CONSULT NOTE   Date of service: March 28, 2024 Patient Name: Linda [name] MRN:  [id number] DOB:  [birth year] Chief Complaint: stroke on mri Requesting Provider: Caleen Burgess BROCKS, MD  History of Present Illness  Linda [name] is a 89 y.o. female with medical history significant for COPD, essential hypertension, hyperlipidemia, who is admitted to [facility] on 03/25/2024 with acute left intertrochanteric femur fracture after ground-level mechanical fall at assisted living. Status post ORIF 8/13. MRI done 8/15 shows watershed infarct in the left ACA/MCA area. Family tells me that prior to her hip surgery she was ambulatory with a rollator and able to dress herself independently.  They do acknowledge that her quality of life is the most important thing for them, however they would like to give her a little bit of time to see how well she comes around.  They are concerned about the amount she is eating and drinking.  They do also have concerns about her being too tired post therapy to adequately eat and drink.   Hx of stroke in 09/2021 Left posterior MCA infarct, concerning for embolic source. Decline long term cardiac monitoring. EF 70-75% and discharged on DAPT and then ASA alone. LDL 51. HgbA1C 5.8. Follows with Dr. Skeet outpatient and Dr. Lonni with [facility].    ROS  Comprehensive ROS performed and pertinent positives documented in HPI   Past History   Past Medical History:  Diagnosis Date   Arthritis    COPD (chronic obstructive pulmonary disease) (HCC)    chronic cough   Glaucoma    Glaucoma    Hyperlipidemia    Hypertension    Irregular heartbeat    Osteoarthritis    Osteoporosis    Stroke (HCC)    Thyroid  disease    enlarged thyroid     Past Surgical History:  Procedure Laterality Date   APPENDECTOMY     cateract surgery     COLONOSCOPY     Done in [place]  yrs ago   INTRAMEDULLARY (IM) NAIL INTERTROCHANTERIC Left 03/26/2024   Procedure: FIXATION,  FRACTURE, INTERTROCHANTERIC, WITH INTRAMEDULLARY, LEFT;  Surgeon: Georgina Ozell LABOR, MD;  Location: [facility] OR;  Service: Orthopedics;  Laterality: Left;   UPPER GASTROINTESTINAL ENDOSCOPY  2019   WRIST SURGERY      Family History: Family History  Problem Relation Age of Onset   Stroke Mother    Heart disease Mother    Heart disease Father    Heart disease Brother    Heart disease Brother    Colon cancer Neg Hx    Colon polyps Neg Hx    Esophageal cancer Neg Hx     Social History  reports that she quit smoking about 73 years ago. Her smoking use included cigarettes. She started smoking about 75 years ago. She has a 0.5 pack-year smoking history. She has been exposed to tobacco smoke. She has never used smokeless tobacco. She reports that she does not currently use alcohol . She reports that she does not use drugs.  Allergies  Allergen Reactions   Cymbalta  [Duloxetine  Hcl] Other (See Comments)    Unknown reaction    Medications   Current Facility-Administered Medications:    acetaminophen  (TYLENOL ) tablet 1,000 mg, 1,000 mg, Oral, Q8H, Moore, Michael A, MD, 1,000 mg at 03/27/24 1417   artificial tears ophthalmic solution 1 drop, 1 drop, Both Eyes, QID, Amin, Ankit C, MD, 1 drop at 03/28/24 1516   Chlorhexidine  Gluconate Cloth 2 % PADS 6 each, 6 each, Topical, Daily, Amin,  Ankit C, MD, 6 each at 03/28/24 1201   clobetasol  ointment (TEMOVATE ) 0.05 % 1 Application, 1 Application, Topical, Q12H PRN, Amin, Ankit C, MD   [START ON 03/29/2024] cyanocobalamin  (VITAMIN B12) tablet 1,000 mcg, 1,000 mcg, Oral, Daily, Amin, Ankit C, MD   dextromethorphan -guaiFENesin  (MUCINEX  DM) 30-600 MG per 12 hr tablet 1 tablet, 1 tablet, Oral, Q12H PRN, Amin, Ankit C, MD   dextrose  5 % and 0.45 % NaCl infusion, , Intravenous, Continuous, Amin, Ankit C, MD, Last Rate: 75 mL/hr at 03/28/24 1159, New Bag at 03/28/24 1159   famotidine  (PEPCID ) tablet 20 mg, 20 mg, Oral, BID, Amin, Ankit C, MD, 20 mg at 03/27/24  1300   feeding supplement (ENSURE PLUS HIGH PROTEIN) liquid 237 mL, 237 mL, Oral, BID BM, Amin, Ankit C, MD   glucagon  (human recombinant) (GLUCAGEN) injection 1 mg, 1 mg, Intravenous, PRN, Georgina Ozell LABOR, MD   hydrALAZINE  (APRESOLINE ) injection 10 mg, 10 mg, Intravenous, Q4H PRN, Georgina Ozell LABOR, MD   hydrocortisone  (ANUSOL -HC) 2.5 % rectal cream 1 Application, 1 Application, Rectal, Q12H PRN, Amin, Ankit C, MD   ipratropium-albuterol  (DUONEB) 0.5-2.5 (3) MG/3ML nebulizer solution 3 mL, 3 mL, Nebulization, Q4H PRN, Georgina Ozell LABOR, MD, 3 mL at 03/27/24 0012   lidocaine  (LIDODERM ) 5 % 2 patch, 2 patch, Transdermal, Q24H, Amin, Ankit C, MD, 2 patch at 03/28/24 1202   melatonin tablet 3 mg, 3 mg, Oral, QHS PRN, Georgina Ozell LABOR, MD, 3 mg at 03/27/24 0107   metoprolol  tartrate (LOPRESSOR ) injection 5 mg, 5 mg, Intravenous, Q4H PRN, Georgina Ozell LABOR, MD   morphine  (PF) 2 MG/ML injection 2 mg, 2 mg, Intravenous, Q3H PRN, Georgina Ozell LABOR, MD, 2 mg at 03/28/24 1322   mupirocin  ointment (BACTROBAN ) 2 % 1 Application, 1 Application, Nasal, BID, Georgina Ozell LABOR, MD, 1 Application at 03/28/24 [phone]   naloxone  (NARCAN ) injection 0.4 mg, 0.4 mg, Intravenous, PRN, Georgina Ozell LABOR, MD   ondansetron  (ZOFRAN ) injection 4 mg, 4 mg, Intravenous, Q6H PRN, Georgina Ozell LABOR, MD, 4 mg at 03/25/24 2213   Oral care mouth rinse, 15 mL, Mouth Rinse, 4 times per day, Amin, Ankit C, MD, 15 mL at 03/28/24 1515   Oral care mouth rinse, 15 mL, Mouth Rinse, PRN, Amin, Ankit C, MD   oxyCODONE  (Oxy IR/ROXICODONE ) immediate release tablet 5 mg, 5 mg, Oral, Q4H, Moore, Michael A, MD, 5 mg at 03/27/24 1830   phenol (CHLORASEPTIC) mouth spray 1 spray, 1 spray, Mouth/Throat, PRN, Chavez, Abigail, NP, 1 spray at 03/27/24 0104   rosuvastatin  (CRESTOR ) tablet 20 mg, 20 mg, Oral, Daily, Amin, Ankit C, MD, 20 mg at 03/27/24 1300   sotalol  (BETAPACE ) tablet 80 mg, 80 mg, Oral, Q12H, Amin, Ankit C, MD, 80 mg at 03/27/24 1418   timolol   (TIMOPTIC ) 0.25 % ophthalmic solution 1 drop, 1 drop, Both Eyes, Daily, Georgina Ozell LABOR, MD, 1 drop at 03/28/24 0831   traMADol  (ULTRAM ) tablet 50 mg, 50 mg, Oral, BID, Amin, Ankit C, MD  Vitals   Vitals:   03/27/24 1451 03/27/24 1529 03/28/24 0400 03/28/24 0731  BP: (!) 149/49 (!) 126/44 (!) 153/55 (!) 144/55  Pulse:  73 83 86  Resp: 20 18  17   Temp: 97.7 F (36.5 C) (!) 97.5 F (36.4 C)  99 F (37.2 C)  TempSrc: Axillary Oral  Axillary  SpO2: 99% 100% 98% 100%  Weight:      Height:        Body mass index is 21.08 kg/m.  Physical Exam   Constitutional: Ill appearing Psych: Drowsy Eyes: No scleral injection.  HENT: No OP obstruction.  Head: Normocephalic.  Cardiovascular: Normal rate and regular rhythm.  Respiratory: Effort normal, non-labored breathing.  GI: Soft.  No distension. There is no tenderness.  Skin: WDI.   Neurologic Examination   Mental Status: Drowsy, requires frequent stimulation throughout exam. States her name  Cranial Nerves: II: Blinks to threat bilaterally, inconsistently III,IV, VI: Right gaze preference V: Facial sensation is symmetric to temperature VII: Facial movement is symmetric resting and smiling VIII: Hearing is intact to voice X: Palate elevates symmetrically XI: Shoulder shrug is symmetric. XII: Tongue protrudes midline without atrophy or fasciculations.  Motor: Tone is normal. Bulk is poor Unable to lift lower extremities off the bed, endorses pain Elevates bilateral upper extremities  Sensory: Responds to noxious stimuli in all extremities but does not stay awake long enough for DSS Cerebellar: No gross ataxia with FNF   Labs/Imaging/Neurodiagnostic studies   CBC:  Recent Labs  Lab 04-12-2024 1751 03/26/24 0413 03/27/24 0537 03/27/24 1702 03/28/24 0637  WBC 8.2 11.2* 8.8  --  10.8*  NEUTROABS 5.7 8.7*  --   --   --   HGB 11.8* 10.8* 6.8* 7.8* 8.1*  HCT 37.2 32.4* 20.0* 23.4* 23.5*  MCV 99.2 94.5 95.2  --  91.1   PLT 217 216 124*  --  122*   Basic Metabolic Panel:  Lab Results  Component Value Date   NA 130 (L) 03/28/2024   K 4.0 03/28/2024   CO2 26 03/28/2024   GLUCOSE 133 (H) 03/28/2024   BUN 12 03/28/2024   CREATININE 0.61 03/28/2024   CALCIUM  9.1 03/28/2024   GFRNONAA >60 03/28/2024   GFRAA 89 06/23/2020   Lipid Panel:  Lab Results  Component Value Date   LDLCALC 51 09/20/2021   HgbA1c:  Lab Results  Component Value Date   HGBA1C 6.0 09/13/2023   Urine Drug Screen:     Component Value Date/Time   LABOPIA NONE DETECTED 09/14/2021 0108   COCAINSCRNUR NONE DETECTED 09/14/2021 0108   LABBENZ NONE DETECTED 09/14/2021 0108   AMPHETMU NONE DETECTED 09/14/2021 0108   THCU NONE DETECTED 09/14/2021 0108   LABBARB NONE DETECTED 09/14/2021 0108    Alcohol  Level     Component Value Date/Time   ETH <10 09/13/2021 2317   INR  Lab Results  Component Value Date   INR 1.0 03/26/2024   APTT  Lab Results  Component Value Date   APTT 36 09/13/2021   AED levels: No results found for: PHENYTOIN, ZONISAMIDE, LAMOTRIGINE, LEVETIRACETA  CT Head without contrast(Personally reviewed): No acute intracranial abnormality. No acute displaced fracture or traumatic listhesis of the cervical spine.  MRI Brain(Personally reviewed): Positive for patchy acute infarcts in the anterior Left frontal lobe white matter, ACA/MCA watershed area. No associated hemorrhage or mass effect. Chronic signal changes in the bilateral cerebellum suggestive of combined chronic hemorrhage, encephalomalacia, Wallerian degeneration  ASSESSMENT  Linda [name] is a 89 y.o. female with a PMHx significant for COPD, essential hypertension, hyperlipidemia, who was admitted to [facility] on 04-12-24 with acute left intertrochanteric femur fracture after ground-level mechanical fall at assisted living. Status post ORIF 8/13. MRI done 8/15 shows watershed infarct in the left ACA/MCA area.  - Exam reveals a  somnolent and frail-appearing elderly female with poor muscle bulk who is unable to lift BLE off the bed.  - MRI brain as documented above.  - Plan for stroke work up and  follow up on results by stroke team.  - Family is focused on quality of life but would like to give her some time to see if she recovers, as she is only two days post op.   RECOMMENDATIONS  - HgbA1c, fasting lipid panel - CTA Head and Neck  - Frequent neuro checks  - Echocardiogram - Prophylactic therapy-Antiplatelet med: Aspirin  - dose 325mg  PO or 300mg  PR - Risk factor modification - Telemetry monitoring - BP management. Avoid hypotension - IVF - PT consult, OT consult, Speech consult - Stroke team to follow  ______________________________________________________________________    Signed, Jorene Last, NP [facility]  I have seen and examined the patient. I have formulated the assessment and recommendations. 89 year old female with left ACA/MCA territory watershed infarction. Exam with somnolence and BLE weakness. Recommendations as above.  Electronically signed: Dr. Leighla Chestnutt

## 2024-03-28 NOTE — Plan of Care (Signed)
  Problem: Clinical Measurements: Goal: Ability to maintain clinical measurements within normal limits will improve Outcome: Progressing Goal: Will remain free from infection Outcome: Progressing Goal: Respiratory complications will improve Outcome: Progressing Goal: Cardiovascular complication will be avoided Outcome: Progressing   Problem: Coping: Goal: Level of anxiety will decrease Outcome: Progressing   

## 2024-03-28 NOTE — Care Management Important Message (Signed)
 Important Message  Patient Details  Name: Linda [name] MRN: [id number] Date of Birth: May 22, 1924   Important Message Given:  Yes - Medicare IM     Jon Cruel 03/28/2024, 12:14 PM

## 2024-03-28 NOTE — Progress Notes (Signed)
 PROGRESS NOTE    Linda [name]  FMW:[id number] DOB: 03/31/1924 DOA: 03/25/2024 PCP: Kennyth Worth HERO, MD    Brief Narrative:   89 y.o. female with medical history significant for COPD, essential hypertension, hyperlipidemia, who is admitted to [facility] on 03/25/2024 with acute left intertrochanteric femur fracture after ground-level mechanical fall at assisted living after presenting from home to [facility] ED complaining of fall. Status post ORIF 8/13.  Postop management, wound care, pain control, postop therapy recommendations per their service.    Assessment & Plan:  Principal Problem:   Closed left hip fracture (HCC) Active Problems:   HLD (hyperlipidemia)   Fall at home, initial encounter   Acute hip pain, left   History of COPD   History of essential hypertension   Mechanical fall with acute left-sided intertrochanteric femur fracture - Status post ORIF 8/13.  Postop management, wound care, pain control, postop therapy recommendations per their service.  -PT/OT  Acute blood loss anemia, postop vs dilutional.  - Hemoglobin baseline 10.8, trended down to 6.8 requiring 1 unit PRBC transfusion.  Hemoglobin now 8.1.  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  Had some vitamin B12 deficiency but MRI performed this morning shows acute patchy watershed CVA.  Will consult neurology team. -Concerns of occult CVA in the past.  She had completed 3 weeks of DAPT now on aspirin  alone.  Previously elected not to proceed with any kind of long-term monitoring  Vitamin B12 deficiency - Start supplements  Hypophosphatemia -replete prn  COPD - As needed bronchodilators  Essential hypertension - IV as needed  Hyperlipidemia -Currently on hold  Concerns of urinary retention, continues to show urinary retention.  Patient has already been straight cathed 3 times.  If this continues, will order Foley catheter   DVT  prophylaxis: SCDs Start: 03/25/24 2017    Code Status: Limited: Do not attempt resuscitation (DNR) -DNR-LIMITED -Do Not Intubate/DNI  Family Communication: Daughter present at bedside Status is: Inpatient Remains inpatient appropriate because: Therapy evaluation  Subjective:  Seen at bedside she has been quite drowsy over the last 24 hours.  Poor oral intake.  Examination:  General exam: Appears calm and comfortable, elderly frail and cachectic, drowsy Respiratory system: Clear to auscultation. Respiratory effort normal. Cardiovascular system: S1 & S2 heard, RRR. No JVD, murmurs, rubs, gallops or clicks. No pedal edema. Gastrointestinal system: Abdomen is nondistended, soft and nontender. No organomegaly or masses felt. Normal bowel sounds heard. Central nervous system: Alert and oriented. No focal neurological deficits. Extremities: Symmetric 4 x 5 power. Skin: Surgical dressing noted Psychiatry: Judgement and insight appear poor                Diet Orders (From admission, onward)     Start     Ordered   03/26/24 2136  Diet regular Room service appropriate? Yes; Fluid consistency: Thin  Diet effective now       Question Answer Comment  Room service appropriate? Yes   Fluid consistency: Thin      03/26/24 2135            Objective: Vitals:   03/27/24 1451 03/27/24 1529 03/28/24 0400 03/28/24 0731  BP: (!) 149/49 (!) 126/44 (!) 153/55 (!) 144/55  Pulse:  73 83 86  Resp: 20 18  17   Temp: 97.7 F (36.5 C) (!) 97.5 F (36.4 C)  99 F (37.2 C)  TempSrc: Axillary Oral    SpO2: 99% 100%  98% 100%  Weight:      Height:        Intake/Output Summary (Last 24 hours) at 03/28/2024 1224 Last data filed at 03/28/2024 0855 Gross per 24 hour  Intake 612 ml  Output 100 ml  Net 512 ml   Filed Weights   03/25/24 2248  Weight: 55.7 kg    Scheduled Meds:  acetaminophen   1,000 mg Oral Q8H   artificial tears  1 drop Both Eyes QID   Chlorhexidine  Gluconate Cloth   6 each Topical Daily   [START ON 03/29/2024] vitamin B-12  1,000 mcg Oral Daily   famotidine   20 mg Oral BID   feeding supplement  237 mL Oral BID BM   ketorolac   15 mg Intravenous Q8H   lidocaine   2 patch Transdermal Q24H   mupirocin  ointment  1 Application Nasal BID   mouth rinse  15 mL Mouth Rinse 4 times per day   oxyCODONE   5 mg Oral Q4H   rosuvastatin   20 mg Oral Daily   sotalol   80 mg Oral Q12H   timolol   1 drop Both Eyes Daily   traMADol   50 mg Oral BID   Continuous Infusions:  dextrose  5 % and 0.45 % NaCl 75 mL/hr at 03/28/24 1159    Nutritional status     Body mass index is 21.08 kg/m.  Data Reviewed:   CBC: Recent Labs  Lab 03/25/24 1751 03/26/24 0413 03/27/24 0537 03/27/24 1702 03/28/24 0637  WBC 8.2 11.2* 8.8  --  10.8*  NEUTROABS 5.7 8.7*  --   --   --   HGB 11.8* 10.8* 6.8* 7.8* 8.1*  HCT 37.2 32.4* 20.0* 23.4* 23.5*  MCV 99.2 94.5 95.2  --  91.1  PLT 217 216 124*  --  122*   Basic Metabolic Panel: Recent Labs  Lab 03/25/24 1751 03/26/24 0413 03/27/24 0537 03/28/24 0637  NA 135 135 132* 130*  K 3.9 4.3 3.5 4.0  CL 104 100 102 99  CO2 22 26 24 26   GLUCOSE 133* 144* 157* 133*  BUN 10 10 11 12   CREATININE 0.64 0.58 0.63 0.61  CALCIUM  9.6 10.1 9.0 9.1  MG 2.0 1.9 1.7 1.8  PHOS  --   --  2.2*  --    GFR: Estimated Creatinine Clearance: 33.9 mL/min (by C-G formula based on SCr of 0.61 mg/dL). Liver Function Tests: Recent Labs  Lab 03/25/24 1751 03/26/24 0413  AST 19 19  ALT 11 11  ALKPHOS 97 92  BILITOT 0.3 0.6  PROT 6.2* 5.9*  ALBUMIN  3.3* 3.1*   No results for input(s): LIPASE, AMYLASE in the last 168 hours. Recent Labs  Lab 03/28/24 0854  AMMONIA <13   Coagulation Profile: Recent Labs  Lab 03/26/24 0413  INR 1.0   Cardiac Enzymes: No results for input(s): CKTOTAL, CKMB, CKMBINDEX, TROPONINI in the last 168 hours. BNP (last 3 results) No results for input(s): PROBNP in the last 8760 hours. HbA1C: No  results for input(s): HGBA1C in the last 72 hours. CBG: No results for input(s): GLUCAP in the last 168 hours. Lipid Profile: No results for input(s): CHOL, HDL, LDLCALC, TRIG, CHOLHDL, LDLDIRECT in the last 72 hours. Thyroid  Function Tests: Recent Labs    03/28/24 0854  TSH 1.073   Anemia Panel: Recent Labs    03/28/24 0854  VITAMINB12 174*   Sepsis Labs: No results for input(s): PROCALCITON, LATICACIDVEN in the last 168 hours.  Recent Results (from the past 240 hours)  Surgical  PCR screen     Status: None   Collection Time: 03/26/24  3:52 AM   Specimen: Nasal Mucosa; Nasal Swab  Result Value Ref Range Status   MRSA, PCR NEGATIVE NEGATIVE Final   Staphylococcus aureus NEGATIVE NEGATIVE Final    Comment: (NOTE) The Xpert SA Assay (FDA approved for NASAL specimens in patients 52 years of age and older), is one component of a comprehensive surveillance program. It is not intended to diagnose infection nor to guide or monitor treatment. Performed at [facility], [address] [place], [place] [address]          Radiology Studies: MR BRAIN WO CONTRAST Result Date: 03/28/2024 CLINICAL DATA:  89 year old female with altered mental status. Recent fall. EXAM: MRI HEAD WITHOUT CONTRAST TECHNIQUE: Multiplanar, multiecho pulse sequences of the brain and surrounding structures were obtained without intravenous contrast. COMPARISON:  Head CT 03/25/2024.  Brain MRI 01/11/2023. FINDINGS: Brain: Patchy restricted diffusion anterior left frontal lobe central and subcortical white matter (series 5, image 81). Associated T2 and FLAIR hyperintensity. No hemorrhage or mass effect. No other diffusion restriction. Chronic signal abnormality in the cerebellum, encephalomalacia and chronic microhemorrhage on the left (series 12, image 15). Vague chronic T2 hyperintensity in the right hemisphere at the junction of the cerebellar peduncle, perhaps Wallerian degeneration),  stable. Other patchy cerebral white matter T2 and FLAIR hyperintensity is stable and generally mild for age. No chronic cerebral cortical encephalomalacia identified. And no other chronic cerebral blood products on SWI. Stable cerebral volume. No midline shift, mass effect, evidence of mass lesion, ventriculomegaly, or acute intracranial hemorrhage. Cervicomedullary junction and pituitary are within normal limits. Vascular: Major intracranial vascular flow voids are stable since last year. Skull and upper cervical spine: Negative. Visualized bone marrow signal is within normal limits. Sinuses/Orbits: Stable. Other: Visible internal auditory structures appear normal. IMPRESSION: 1. Positive for patchy acute infarcts in the anterior Left frontal lobe white matter, ACA/MCA watershed area. No associated hemorrhage or mass effect. 2. No other acute intracranial abnormality. Chronic signal changes in the bilateral cerebellum suggestive of combined chronic hemorrhage, encephalomalacia, Wallerian degeneration. Electronically Signed   By: VEAR Hurst M.D.   On: 03/28/2024 11:46   DG FEMUR MIN 2 VIEWS LEFT Result Date: 03/27/2024 CLINICAL DATA:  Left hip ORIF, postoperative examination EXAM: LEFT FEMUR 2 VIEWS COMPARISON:  None Available. FINDINGS: Two view radiograph left femur demonstrates surgical changes of left hip ORIF with a intramedullary rod with proximal and distal interlocking screws. Fracture fragments are in near anatomic alignment. No unexpected fracture or dislocation. Mild left hip degenerative arthritis. Avulsion of the lesser trochanter again noted with moderate distraction of fracture fragments by up to 1 cm. Gas and surgical staples are seen within the lateral soft tissues proximally and distally. IMPRESSION: 1. Left hip ORIF with fracture fragments in near anatomic alignment. Electronically Signed   By: Dorethia Molt M.D.   On: 03/27/2024 01:05   DG FEMUR MIN 2 VIEWS LEFT Result Date:  03/26/2024 CLINICAL DATA:  Elective surgery. EXAM: LEFT FEMUR 2 VIEWS COMPARISON:  Hip radiograph yesterday FINDINGS: Six fluoroscopic spot views of the femur submitted from the operating room. Femoral intramedullary nail with trans trochanteric and distal locking screw fixation traverse proximal femur fracture. Fluoroscopy time 126.5 seconds. Dose 11.09 mGy IMPRESSION: Intraoperative fluoroscopy during proximal femur fracture ORIF. Electronically Signed   By: Andrea Gasman M.D.   On: 03/26/2024 20:46   DG C-Arm 1-60 Min-No Report Result Date: 03/26/2024 Fluoroscopy was utilized by  the requesting physician.  No radiographic interpretation.   DG C-Arm 1-60 Min-No Report Result Date: 03/26/2024 Fluoroscopy was utilized by the requesting physician.  No radiographic interpretation.           LOS: 3 days   Time spent= 35 mins    Burgess JAYSON Dare, MD [facility]  If 7PM-7AM, please contact night-coverage  03/28/2024, 12:24 PM

## 2024-03-28 NOTE — Plan of Care (Signed)
  Problem: Clinical Measurements: Goal: Ability to maintain clinical measurements within normal limits will improve Outcome: Progressing Goal: Will remain free from infection Outcome: Progressing Goal: Respiratory complications will improve Outcome: Progressing Goal: Cardiovascular complication will be avoided Outcome: Progressing   Problem: Coping: Goal: Level of anxiety will decrease Outcome: Progressing   Problem: Education: Goal: Knowledge of disease or condition will improve Outcome: Not Progressing- disoriented x 4 Goal: Knowledge of secondary prevention will improve (MUST DOCUMENT ALL) Outcome: Not Progressing Goal: Knowledge of patient specific risk factors will improve (DELETE if not current risk factor) Outcome: Not Progressing

## 2024-03-28 NOTE — Progress Notes (Signed)
 OT Cancellation Note  Patient Details Name: Linda [name] MRN: [id number] DOB: June 27, 1924   Cancelled Treatment:    Reason Eval/Treat Not Completed: Patient's level of consciousness;Medical issues which prohibited therapy (Pt not appropriate for OT eval at this time due to low level of alertness this morning and RN preparing to place a Foley catheter. OT to reattempt to see pt at a later time as appropriate/available.)  Margarie Rockey HERO., OTR/L, MA Acute Rehab [phone]   Margarie FORBES Horns 03/28/2024, 10:06 AM

## 2024-03-29 ENCOUNTER — Inpatient Hospital Stay (HOSPITAL_COMMUNITY)

## 2024-03-29 DIAGNOSIS — I69391 Dysphagia following cerebral infarction: Secondary | ICD-10-CM | POA: Diagnosis not present

## 2024-03-29 DIAGNOSIS — Z87891 Personal history of nicotine dependence: Secondary | ICD-10-CM

## 2024-03-29 DIAGNOSIS — E785 Hyperlipidemia, unspecified: Secondary | ICD-10-CM | POA: Diagnosis not present

## 2024-03-29 DIAGNOSIS — S72102G Unspecified trochanteric fracture of left femur, subsequent encounter for closed fracture with delayed healing: Secondary | ICD-10-CM | POA: Diagnosis not present

## 2024-03-29 DIAGNOSIS — I6340 Cerebral infarction due to embolism of unspecified cerebral artery: Secondary | ICD-10-CM

## 2024-03-29 DIAGNOSIS — R29724 NIHSS score 24: Secondary | ICD-10-CM

## 2024-03-29 LAB — CBC
HCT: 24.4 % — ABNORMAL LOW (ref 36.0–46.0)
Hemoglobin: 8 g/dL — ABNORMAL LOW (ref 12.0–15.0)
MCH: 30.7 pg (ref 26.0–34.0)
MCHC: 32.8 g/dL (ref 30.0–36.0)
MCV: 93.5 fL (ref 80.0–100.0)
Platelets: 177 K/uL (ref 150–400)
RBC: 2.61 MIL/uL — ABNORMAL LOW (ref 3.87–5.11)
RDW: 14.4 % (ref 11.5–15.5)
WBC: 9.3 K/uL (ref 4.0–10.5)
nRBC: 0 % (ref 0.0–0.2)

## 2024-03-29 LAB — BASIC METABOLIC PANEL WITH GFR
Anion gap: 7 (ref 5–15)
BUN: 9 mg/dL (ref 8–23)
CO2: 23 mmol/L (ref 22–32)
Calcium: 9.3 mg/dL (ref 8.9–10.3)
Chloride: 101 mmol/L (ref 98–111)
Creatinine, Ser: 0.51 mg/dL (ref 0.44–1.00)
GFR, Estimated: >60 mL/min (ref >60)
Glucose, Bld: 165 mg/dL — ABNORMAL HIGH (ref 70–99)
Potassium: 4.1 mmol/L (ref 3.5–5.1)
Sodium: 131 mmol/L — ABNORMAL LOW (ref 135–145)

## 2024-03-29 LAB — LIPID PANEL
Cholesterol: 100 mg/dL (ref 0–200)
HDL: 41 mg/dL (ref >40)
LDL Cholesterol: 42 mg/dL (ref 0–99)
Total CHOL/HDL Ratio: 2.4 ratio
Triglycerides: 86 mg/dL (ref <150)
VLDL: 17 mg/dL (ref 0–40)

## 2024-03-29 LAB — HEMOGLOBIN A1C
Hgb A1c MFr Bld: 5.3 % (ref 4.8–5.6)
Mean Plasma Glucose: 105.41 mg/dL

## 2024-03-29 LAB — MAGNESIUM: Magnesium: 2.1 mg/dL (ref 1.7–2.4)

## 2024-03-29 MED ORDER — IOHEXOL 350 MG/ML SOLN
75.0000 mL | Freq: Once | INTRAVENOUS | Status: AC | PRN
  Administered 2024-03-29: 75 mL via INTRAVENOUS

## 2024-03-29 MED ORDER — HALOPERIDOL LACTATE 5 MG/ML IJ SOLN
1.0000 mg | Freq: Once | INTRAMUSCULAR | Status: AC
  Administered 2024-03-30: 1 mg via INTRAVENOUS
  Filled 2024-03-29: qty 1

## 2024-03-29 MED ORDER — ENSURE SURGERY PO LIQD
237.0000 mL | Freq: Two times a day (BID) | ORAL | Status: DC
  Administered 2024-03-30 – 2024-04-01 (×5): 237 mL via ORAL
  Filled 2024-03-29 (×8): qty 237

## 2024-03-29 MED ORDER — MORPHINE SULFATE (PF) 2 MG/ML IV SOLN
2.0000 mg | Freq: Once | INTRAVENOUS | Status: AC
  Administered 2024-03-29: 2 mg via INTRAVENOUS
  Filled 2024-03-29: qty 1

## 2024-03-29 MED ORDER — OXYCODONE HCL 5 MG PO TABS
2.5000 mg | ORAL_TABLET | ORAL | Status: DC | PRN
  Administered 2024-03-29: 2.5 mg via ORAL
  Administered 2024-03-29: 5 mg via ORAL
  Administered 2024-03-30: 2.5 mg via ORAL
  Administered 2024-03-31: 5 mg via ORAL
  Administered 2024-03-31: 2.5 mg via ORAL
  Administered 2024-04-01: 5 mg via ORAL
  Administered 2024-04-01: 2.5 mg via ORAL
  Filled 2024-03-29 (×7): qty 1

## 2024-03-29 MED ORDER — POLYETHYLENE GLYCOL 3350 17 G PO PACK
17.0000 g | PACK | Freq: Every day | ORAL | Status: DC
  Administered 2024-03-29 – 2024-04-01 (×4): 17 g via ORAL
  Filled 2024-03-29 (×4): qty 1

## 2024-03-29 MED ORDER — TRANEXAMIC ACID-NACL 1000-0.7 MG/100ML-% IV SOLN: 1000.0000 mg | Freq: Once | INTRAVENOUS | Status: DC

## 2024-03-29 MED ORDER — SENNOSIDES-DOCUSATE SODIUM 8.6-50 MG PO TABS: 1.0000 | ORAL_TABLET | Freq: Every evening | ORAL | Status: DC | PRN

## 2024-03-29 NOTE — Progress Notes (Signed)
 Tele order expired. Dr. Caleen made aware. No need to continue, per provider. Tel discontinued.

## 2024-03-29 NOTE — Progress Notes (Addendum)
 STROKE TEAM PROGRESS NOTE   SUBJECTIVE (INTERIM HISTORY) Her daughter is at the bedside. The patient recently received narcotics for physical therapy and is noted to have a change from her baseline prior to receiving medications. She is drowsy and confused but able to move limbs spontaneously and minimally engage with the stroke team. According to her daughter, prior to her hip surgery, she was ambulatory with a rollator and able to dress herself independently. Once medically stabilized, patient can resume aspirin  outpatient.   OBJECTIVE  CBC    Component Value Date/Time   WBC 9.3 03/29/2024 0957   RBC 2.61 (L) 03/29/2024 0957   HGB 8.0 (L) 03/29/2024 0957   HCT 24.4 (L) 03/29/2024 0957   PLT 177 03/29/2024 0957   MCV 93.5 03/29/2024 0957   MCH 30.7 03/29/2024 0957   MCHC 32.8 03/29/2024 0957   RDW 14.4 03/29/2024 0957   LYMPHSABS 1.6 03/26/2024 0413   MONOABS 0.7 03/26/2024 0413   EOSABS 0.0 03/26/2024 0413   BASOSABS 0.0 03/26/2024 0413    BMET    Component Value Date/Time   NA 131 (L) 03/29/2024 0738   NA 140 07/18/2017 0000   K 4.1 03/29/2024 0738   CL 101 03/29/2024 0738   CO2 23 03/29/2024 0738   GLUCOSE 165 (H) 03/29/2024 0738   BUN 9 03/29/2024 0738   BUN 8 07/18/2017 0000   CREATININE 0.51 03/29/2024 0738   CREATININE 0.72 11/24/2022 1421   CALCIUM  9.3 03/29/2024 0738   GFRNONAA >60 03/29/2024 0738   GFRNONAA 77 06/23/2020 1027    IMAGING past 24 hours CT ANGIO HEAD NECK W WO CM Result Date: 03/29/2024 EXAM: CTA Head and Neck without and with Intravenous Contrast. CT Head without Contrast. CLINICAL HISTORY: Stroke/TIA, determine embolic source. TECHNIQUE: Axial CTA images of the head and neck performed with intravenous contrast. MIP reconstructed images were created and reviewed. Axial computed tomography images of the head/brain performed without intravenous contrast. Note: Per PQRS, the description of internal carotid artery percent stenosis, including 0  percent or normal exam, is based on [place] [employer] Symptomatic Carotid Endarterectomy Trial (NASCET) criteria. Dose reduction technique was used including one or more of the following: automated exposure control, adjustment of mA and kV according to patient size, and/or iterative reconstruction. CONTRAST: 75mL (iohexol  (OMNIPAQUE ) 350 MG/ML injection 75 mL IOHEXOL  350 MG/ML SOLN) COMPARISON: 03/25/24 and 09/22/21 FINDINGS: CT HEAD: BRAIN: Mild chronic ischemic white matter changes. Mild volume loss. No acute intraparenchymal hemorrhage. No mass lesion. No CT evidence for acute territorial infarct. No midline shift or extra-axial collection. VENTRICLES: No hydrocephalus. ORBITS: The orbits are unremarkable. SINUSES AND MASTOIDS: Left sphenoid sinus opacification. CTA NECK: COMMON CAROTID ARTERIES: No significant stenosis. No dissection or occlusion. Calcific aortic atherosclerosis. INTERNAL CAROTID ARTERIES: Atherosclerotic calcifications of the cavernous and clinoid segments of the internal carotid arteries at the skull base. No hemodynamically significant stenosis by NASCET criteria. VERTEBRAL ARTERIES: No significant stenosis. No dissection or occlusion. CTA HEAD: ANTERIOR CEREBRAL ARTERIES: No significant stenosis. No occlusion. No aneurysm. MIDDLE CEREBRAL ARTERIES: There is unchanged occlusion of the left MCA M2 branch (series 9 image 93). Distal M2 branches are patent. POSTERIOR CEREBRAL ARTERIES: Atherosclerotic irregularity of both posterior cerebral arteries without high-grade stenosis. No occlusion. No aneurysm. BASILAR ARTERY: No significant stenosis. No occlusion. No aneurysm. OTHER: Heterogeneous and enlarged thyroid  gland that extends into the upper mediastinum. SOFT TISSUES: No acute finding. No masses or lymphadenopathy. IMPRESSION: 1. Unchanged occlusion of a left MCA M2 branch. Distal M2 branches  are patent. 2. Atherosclerotic irregularity of both posterior cerebral arteries without high-grade  stenosis. 3. Calcific atherosclerosis of both carotid bifurcations without hemodynamically significant stenosis by NASCET criteria. 4. Atherosclerotic calcifications of the cavernous and clinoid segments of the internal carotid arteries at the skull base. Electronically signed by: Franky Stanford MD 03/29/2024 03:31 AM EDT RP Workstation: [id number]    Vitals:   03/28/24 2011 03/29/24 0500 03/29/24 0523 03/29/24 0844  BP: (!) 140/51  (!) 161/57 (!) 168/46  Pulse: 92  88 92  Resp: 14  14 16   Temp: 98.4 F (36.9 C)  98.8 F (37.1 C) 97.9 F (36.6 C)  TempSrc:   Oral Oral  SpO2: 98%  95% 98%  Weight:  59 kg    Height:         PHYSICAL EXAM General: Ill-appearing and drowsy patient in no acute distress CV: Regular rate and rhythm on monitor Respiratory:  Regular, unlabored respirations on room air   NEURO:  Mental Status: Drowsy, requires frequent stimulation throughout exam. Unable to state name, location, or date Speech/Language: speech is without dysarthria or aphasia.  Naming, repetition, fluency, and comprehension intact.  Cranial Nerves:  II: PERRL. Visual fields full.  III, IV, VI: EOMI. Right gaze preference  V: Sensation is intact to light touch and symmetrical to face.  VII: Face is symmetrical resting and smiling VIII: hearing intact to voice. IX, X: Palate elevates symmetrically. Phonation is normal.  KP:Dynloizm shrug 5/5. XII: tongue is midline without fasciculations. Motor: 5/5 strength to all muscle groups tested, moves spontaneously Tone: is normal Bulk is poor  Sensation- Intact to light touch bilaterally. Extinction absent to light touch to DSS.   Coordination: FTN intact bilaterally, HKS: no ataxia in BLE.No drift.  Gait- deferred  Most Recent NIH  Dizziness Present: No (08/16 0800) Headache Present: No (08/16 0800) Interval: Shift assessment (08/16 0400) Level of Consciousness (1a.)   : Not alert, but arousable by minor stimulation to obey, answer, or  respond (08/16 0400) LOC Questions (1b. )   : Answers neither question correctly (08/16 0400) LOC Commands (1c. )   : Performs neither task correctly (08/16 0400) Best Gaze (2. )  : Forced deviation (08/16 0400) Visual (3. )  : Partial hemianopia (08/16 0400) Facial Palsy (4. )    : Normal symmetrical movements (08/16 0400) Motor Arm, Left (5a. )   : Some effort against gravity (08/16 0400) Motor Arm, Right (5b. ) : Some effort against gravity (08/16 0400) Motor Leg, Left (6a. )  : No effort against gravity (08/16 0400) Motor Leg, Right (6b. ) : No effort against gravity (08/16 0400) Limb Ataxia (7. ): Present in two limbs (08/16 0400) Sensory (8. )  : Mild-to-moderate sensory loss, patient feels pinprick is less sharp or is dull on the affected side, or there is a loss of superficial pain with pinprick, but patient is aware of being touched (08/16 0400) Best Language (9. )  : Severe aphasia (08/16 0400) Dysarthria (10. ): Normal (08/16 0400) Extinction/Inattention (11.)   : Visual/tactile/auditory/spatial/personal inattention (08/16 0400) Complete NIHSS TOTAL: 24 (08/16 0400)    ASSESSMENT/PLAN  [name] is a 89 y.o. female with history of COPD, hypertension, and hyperlipidemia admitted for acute left femur fracture after ground-level fall at assisted living.  MRI ordered due to acute change in neurological status, showing acute patchy watershed CVA.  Stroke team consulted.  Once patient is medically stabilized, she can resume aspirin  daily.   Acute Ischemic  Infarct:  L frontal patchy infarcts, MCA/ACA watershed zone, etiology: concerning for occlut A-fib CT head No acute abnormality.  CTA head & neck occlusion of the left MCA M2 branch.  Calcific arthrosclerosis of both carotid bifurcations.  Arthrosclerotic calcifications of the cavernous and clinoid segments of the ICA at the skull base. MRI  Positive for patchy acute infarcts in the anterior Left frontal lobe white matter,  ACA/MCA watershed area.  2D Echo pending LDL 42 HgbA1c 5.3 VTE prophylaxis - SCDs aspirin  81 mg daily prior to admission, consider to resume home aspirin  81 mg daily once anemia stable and cleared by orthopedic surgery Therapy recommendations: SNF Disposition:  pending  Hx of Stroke/TIA 08/2021: L posterior MCA infarct, embolic pattern, concerning for occult A-fib. However, pt declined prolonged heart monitoring to rule out A-fib. Discharged on DAPT and crestor  40  Paroxysmal atrial tachycardia Home meds: Sotalol  80 mg 2023: Recommend long-term heart monitoring to rule out A-fib, however, pt refused On home ASA  Severe anemia  Hb 11.8->10.8->6.8->7.8->8.0 S/p PRBC Once stabilized, may consider resume home ASA  Vitamin B12 deficiency B12 = 174 Start VITB12 supplement  Hypertension Home meds:  Amlodipine  2.5 mg Stable Long term BP goal normotensive  Hyperlipidemia Home meds: rosuvastatin  20 mg, resumed in hospital LDL 42, goal < 70 Continue statin at discharge  Dysphagia Patient has post-stroke dysphagia SLP consulted On dys 1 and think liquid Advance diet as tolerated  Other Stroke Risk Factors Advanced age Former smoker CAD CHF Migraines  Other Active Problems left intertrochanteric femur fracture s/p surgery Depression/anxiety COPD  Hospital day # 4  Alan Maiden, MD PGY-1  ATTENDING NOTE: I reviewed above note and agree with the assessment and plan. Pt was seen and examined.   Daughter at bedside.  Patient lethargic, lying in bed, eyes open to voice but answer all questions  I do not know.  Per daughter, patient more awake alert this morning but after pain medication she is more lethargic and not quite answer questions or follow commands.  Not able to name and repeat. Eyes midline, blinking to visual threat bilaterally, PERRL. No significant facial droop. Tongue protrusion not corporative.  Seems able to move all extremities symmetrically however not  quite follow commands.  Sensation and coordination not cooperative on exam, gait not tested.   Pt lethargic after pain medication administration. Stroke more embolic pattern, concerning for occult afib but pt has refused prolong cardiac monitoring. With recent severe anemia s/p PRBC, will not recommend DAPT but consider ASA 81 home meds once Hb stabilized and ortho is in agreement. Discussed with Dr. Caleen.  For detailed assessment and plan, please refer to above as I have made changes wherever appropriate.   Neurology will sign off. Please call with questions. Pt will follow up with Dr. Skeet at [facility] in about 4 weeks. Thanks for the consult.  Ary Cummins, MD PhD Stroke Neurology 03/29/2024 4:39 PM      To contact Stroke Continuity provider, please refer to [email or web address]. After hours, contact General Neurology

## 2024-03-29 NOTE — Evaluation (Signed)
 Physical Therapy Evaluation Patient Details Name: Linda [name] MRN: [id number] DOB: 30-Apr-1924 Today's Date: 03/29/2024  History of Present Illness  Linda [name] is a 89 y.o. female admitted 03/25/24 after ground level mechanical fall at ALF resulting in acute left pertrochanteric femur fracture s/p intramedullary rodding 8/13. Pt with decreased level of alertness prompting MRI 8/15 that revealed patchy acute infarcts in the anterior L frontal lobe matter, ACA/MCA watershed area. 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 PMHx: COPD, essential HTN, CVA, thyroid  disease, and HLD.   Clinical Impression  Pt admitted with above diagnosis. PTA, pt was modI for functional mobility using a rollator, modI to supervision for ADLs, and required assistance with IADLs. She resides at an ALF.  Pt currently with functional limitations due to the deficits listed below (see PT Problem List). She required totalA x2 for bed mobility and totalA to maintain upright posture seated EOB. Pt demonstrated posterior lean and poor cervical control resting in flexion and right lateral flexion. Examination limited by impaired cognition and lethargy suspected d/t pain medicine. Pt will benefit from acute skilled PT to maximize her independence and safety with mobility to allow discharge. Recommend continued inpatient follow up therapy, <3 hours/day.    If plan is discharge home, recommend the following: Two people to help with walking and/or transfers;Two people to help with bathing/dressing/bathroom;Supervision due to cognitive status;Direct supervision/assist for medications management;Direct supervision/assist for financial management;Assistance with feeding;Assistance with cooking/housework;Assist for transportation;Help with stairs or ramp for entrance   Can travel by private vehicle   No    Equipment Recommendations Hospital bed;Hoyer lift;Wheelchair (measurements PT)  Recommendations for Other Services        Functional Status Assessment Patient has had a recent decline in their functional status and demonstrates the ability to make significant improvements in function in a reasonable and predictable amount of time.     Precautions / Restrictions Precautions Precautions: Fall Recall of Precautions/Restrictions: Impaired Restrictions Weight Bearing Restrictions Per Provider Order: Yes LLE Weight Bearing Per Provider Order: Weight bearing as tolerated      Mobility  Bed Mobility Overal bed mobility: Needs Assistance Bed Mobility: Supine to Sit, Sit to Supine, Rolling Rolling: Total assist   Supine to sit: Total assist, +2 for physical assistance, HOB elevated Sit to supine: Total assist, +2 for physical assistance, HOB elevated   General bed mobility comments: Pivoted pt to/from EOB with use of bed pad and assist to manage trunk and BLEs. Rolled pt R/L to readjust bed pads. Repositioned pt with +2 assist and use of bed features and bed pad.    Transfers                        Ambulation/Gait                  Stairs            Wheelchair Mobility     Tilt Bed    Modified Rankin (Stroke Patients Only) Modified Rankin (Stroke Patients Only) Pre-Morbid Rankin Score: No significant disability Modified Rankin: Moderately severe disability     Balance Overall balance assessment: Needs assistance Sitting-balance support: Bilateral upper extremity supported, Feet supported Sitting balance-Leahy Scale: Poor Sitting balance - Comments: Pt dependent on maxA to maintain upright posture while seated EOB. Pt showed limited cervical activation resting in flexion and right lateral flexion. Postural control: Posterior lean  Pertinent Vitals/Pain Pain Assessment Pain Assessment: Faces Faces Pain Scale: Hurts even more Pain Location: BLE (R>L) Pain Descriptors / Indicators: Grimacing, Moaning, Discomfort Pain  Intervention(s): Premedicated before session, Monitored during session, Limited activity within patient's tolerance, Repositioned    Home Living Family/patient expects to be discharged to:: Assisted living                 Home Equipment: Rollator (4 wheels)      Prior Function Prior Level of Function : Independent/Modified Independent             Mobility Comments: ModI. Ambulates with rollator. No falls in the past 40mo. ADLs Comments: Staff provides standing assist just watching her during ADLs for safety. She is able to bath, dress, feed herself and manage hygiene. Staff manages medications. She goes down to the dining hall for meals. Goes on outing with her family.     Extremity/Trunk Assessment   Upper Extremity Assessment Upper Extremity Assessment: Defer to OT evaluation    Lower Extremity Assessment Lower Extremity Assessment: Difficult to assess due to impaired cognition;RLE deficits/detail;LLE deficits/detail RLE Deficits / Details: PROM WFL, but noted pt grimacing in pain. No active motion noted supine in bed. Pt slightly wiggled toes seated EOB. RLE: Unable to fully assess due to pain RLE Coordination: decreased gross motor LLE Deficits / Details: PROM WFL, but noted pt grimacing in pain. Pt actively moved ankle slightly in DF/PF while supine in bed. Pt actively began to extend knee while seated EOB. LLE: Unable to fully assess due to pain LLE Coordination: decreased gross motor    Cervical / Trunk Assessment Cervical / Trunk Assessment: Kyphotic;Other exceptions Cervical / Trunk Exceptions: Pt's neck resting in right lateral flexion. Pt was unable to hold her head up while sitting EOB.  Communication   Communication Communication: Impaired Factors Affecting Communication: Difficulty expressing self;Reduced clarity of speech    Cognition Arousal: Lethargic, Suspect due to medications Behavior During Therapy: WFL for tasks assessed/performed   PT -  Cognitive impairments: Difficult to assess Difficult to assess due to: Level of arousal                     PT - Cognition Comments: Pt was able to state her name, but not answer orientation questions. She kept her eyes closed for the majority of the session. Following commands: Impaired Following commands impaired: Follows one step commands inconsistently, Follows one step commands with increased time     Cueing Cueing Techniques: Verbal cues, Gestural cues, Tactile cues, Visual cues     General Comments      Exercises     Assessment/Plan    PT Assessment Patient needs continued PT services  PT Problem List Decreased strength;Decreased activity tolerance;Decreased balance;Decreased mobility;Decreased coordination;Decreased cognition;Decreased knowledge of use of DME;Decreased safety awareness;Decreased knowledge of precautions       PT Treatment Interventions DME instruction;Gait training;Functional mobility training;Therapeutic activities;Therapeutic exercise;Balance training;Neuromuscular re-education;Cognitive remediation;Patient/family education;Wheelchair mobility training    PT Goals (Current goals can be found in the Care Plan section)  Acute Rehab PT Goals Patient Stated Goal: Daughter reports for pt to regain her mobility PT Goal Formulation: Patient unable to participate in goal setting Time For Goal Achievement: 04/12/24 Potential to Achieve Goals: Fair    Frequency Min 2X/week     Co-evaluation PT/OT/SLP Co-Evaluation/Treatment: Yes Reason for Co-Treatment: Complexity of the patient's impairments (multi-system involvement);For patient/therapist safety;To address functional/ADL transfers PT goals addressed during session: Mobility/safety with mobility;Balance  AM-PAC PT 6 Clicks Mobility  Outcome Measure Help needed turning from your back to your side while in a flat bed without using bedrails?: Total Help needed moving from lying on your  back to sitting on the side of a flat bed without using bedrails?: Total Help needed moving to and from a bed to a chair (including a wheelchair)?: Total Help needed standing up from a chair using your arms (e.g., wheelchair or bedside chair)?: Total Help needed to walk in hospital room?: Total Help needed climbing 3-5 steps with a railing? : Total 6 Click Score: 6    End of Session   Activity Tolerance: Patient limited by lethargy;Patient limited by pain Patient left: in bed;with call bell/phone within reach;with bed alarm set;with family/visitor present Nurse Communication: Mobility status;Need for lift equipment PT Visit Diagnosis: Difficulty in walking, not elsewhere classified (R26.2);Other abnormalities of gait and mobility (R26.89);Hemiplegia and hemiparesis;Unsteadiness on feet (R26.81) Hemiplegia - Right/Left: Right Hemiplegia - dominant/non-dominant: Dominant Hemiplegia - caused by: Cerebral infarction    Time: 8943-8876 PT Time Calculation (min) (ACUTE ONLY): 27 min   Charges:   PT Evaluation $PT Eval Moderate Complexity: 1 Mod   PT General Charges $$ ACUTE PT VISIT: 1 Visit         Randall SAUNDERS, PT, DPT Acute Rehabilitation Services Office: [phone] Secure Chat Preferred  Delon CHRISTELLA Callander 03/29/2024, 12:43 PM

## 2024-03-29 NOTE — Evaluation (Signed)
 Occupational Therapy Evaluation Patient Details Name: Linda [name] MRN: [id number] DOB: May 15, 1924 Today's Date: 03/29/2024   History of Present Illness   Linda [name] is a 89 y.o. female admitted 03/25/24 after ground level mechanical fall at ALF resulting in acute left pertrochanteric femur fracture s/p intramedullary rodding 8/13. Pt with decreased level of alertness prompting MRI 8/15 that revealed patchy acute infarcts in the anterior L frontal lobe matter, ACA/MCA watershed area. 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 PMHx: COPD, essential HTN, CVA, thyroid  disease, and HLD.     Clinical Impressions At baseline, pt resides at an ALF and is Mod I to Supervision for ADLs, Mod I for functional mobility using a rollator, and requires assistance from staff or family for IADLs. Pt currently presents with decreased activity tolerance, lethargy, decreased cognition, decreased balance, generalized B UE weakness, decreased B UE coordination, and decreased safety and independence with functional tasks. Pt now largely completing ADLs with Max to Total assist +2 and bed mobility with Total assist +2. Pt made good attempt to participate during session but was limited by impaired cognition, pain, and lethargy suspected due to pain medicine. Pt has good family support. Pt will benefit from acute skilled OT services to address deficits and increase safety and independence with functional tasks. Post acute discharge, pt will benefit from intensive inpatient skilled rehab services < 3 hours per day to maximize rehab potential.      If plan is discharge home, recommend the following:   Two people to help with walking and/or transfers;Two people to help with bathing/dressing/bathroom;Assistance with cooking/housework;Assistance with feeding;Direct supervision/assist for medications management;Direct supervision/assist for financial management;Assist for transportation;Help with stairs or ramp for  entrance     Functional Status Assessment   Patient has had a recent decline in their functional status and demonstrates the ability to make significant improvements in function in a reasonable and predictable amount of time.     Equipment Recommendations   Other (comment) (defer to next level of care)     Recommendations for Other Services         Precautions/Restrictions   Precautions Precautions: Fall Recall of Precautions/Restrictions: Impaired Restrictions Weight Bearing Restrictions Per Provider Order: Yes LLE Weight Bearing Per Provider Order: Weight bearing as tolerated     Mobility Bed Mobility Overal bed mobility: Needs Assistance Bed Mobility: Supine to Sit, Sit to Supine, Rolling Rolling: Total assist   Supine to sit: Total assist, +2 for physical assistance, HOB elevated Sit to supine: Total assist, +2 for physical assistance, HOB elevated   General bed mobility comments: Pivoted pt to/from EOB with use of bed pad and assist to manage trunk and BLEs. Rolled pt R/L to readjust bed pads. Repositioned pt with +2 assist and use of bed features and bed pad.    Transfers                          Balance Overall balance assessment: Needs assistance Sitting-balance support: Bilateral upper extremity supported, Feet supported Sitting balance-Leahy Scale: Poor Sitting balance - Comments: Pt dependent on totalA to maintain upright posture while seated EOB. Pt showed limited cervical activation resting in flexion and right lateral flexion. Postural control: Posterior lean                                 ADL either performed or assessed with clinical judgement   ADL Overall  ADL's : Needs assistance/impaired Eating/Feeding: Maximal assistance;Bed level;Cueing for sequencing (cues for initiation)   Grooming: Wash/dry face;Wash/dry hands;Maximal assistance;Cueing for sequencing;Bed level (cues for initiation)   Upper Body Bathing:  Total assistance;Bed level   Lower Body Bathing: Total assistance;+2 for physical assistance;+2 for safety/equipment;Bed level   Upper Body Dressing : Total assistance;Bed level   Lower Body Dressing: Total assistance;+2 for physical assistance;+2 for safety/equipment;Bed level     Toilet Transfer Details (indicate cue type and reason): unable at this time Toileting- Architect and Hygiene: Total assistance;+2 for physical assistance;+2 for safety/equipment;Bed level         General ADL Comments: Pt with decreased activity tolerance and lethargic this session     Vision Baseline Vision/History: 3 Glaucoma (per daughter's report) Patient Visual Report: Other (comment) (Pt unable to report and largely keeping eyes closed during session  unless cued to open them) Additional Comments: vision to be further assessed in funcitonal contexts in future sessions     Perception         Praxis         Pertinent Vitals/Pain Pain Assessment Pain Assessment: Faces Faces Pain Scale: Hurts even more Pain Location: BLE (R>L) Pain Descriptors / Indicators: Grimacing, Moaning, Discomfort Pain Intervention(s): Premedicated before session, Limited activity within patient's tolerance, Monitored during session, Repositioned     Extremity/Trunk Assessment Upper Extremity Assessment Upper Extremity Assessment: Right hand dominant;Generalized weakness;RUE deficits/detail;LUE deficits/detail RUE Deficits / Details: generalized weakness; minimal purposeful activation of digits of hand, no further AROM of UE; decreased PROM shoulder flexion to approximately 100 degress, PROM otherwise WFL; decreased coordination; sensation to be further tested in funcitonal context during future sessions RUE Coordination: decreased fine motor;decreased gross motor LUE Deficits / Details: generalized weakness; pt reflexively grabbing bed rail with hand during bed mobility and reflexively grasping wash cloth  when placed in hand, no further AROM of UE; PROM WFL; decreased coordination; sensation to be further tested in funcitonal context during future sessions LUE Coordination: decreased fine motor;decreased gross motor   Lower Extremity Assessment Lower Extremity Assessment: Defer to PT evaluation RLE Deficits / Details: PROM WFL, but noted pt grimacing in pain. No active motion noted supine in bed. Pt slightly wiggled toes seated EOB. RLE: Unable to fully assess due to pain RLE Coordination: decreased gross motor LLE Deficits / Details: PROM WFL, but noted pt grimacing in pain. Pt actively moved ankle slightly in DF/PF while supine in bed. Pt actively began to extend knee while seated EOB. LLE: Unable to fully assess due to pain LLE Coordination: decreased gross motor   Cervical / Trunk Assessment Cervical / Trunk Assessment: Kyphotic;Other exceptions Cervical / Trunk Exceptions: Pt's neck resting in right lateral flexion. Pt was unable to hold her head up while sitting EOB.   Communication Communication Communication: Impaired Factors Affecting Communication: Difficulty expressing self;Reduced clarity of speech   Cognition Arousal: Lethargic, Suspect due to medications Behavior During Therapy: WFL for tasks assessed/performed Cognition: Cognition impaired, History of cognitive impairments   Orientation impairments:  (Uncertain as pt with limited verbalizations/reduced clarity of speech this session) Awareness: Intellectual awareness impaired, Online awareness impaired Memory impairment (select all impairments): Working [employer] fast streamer, Programmer, systems, Non-declarative long-term memory (Uncertain of declarative long-term memory as pt with limited verbalizations/reduced clarity of speech this session) Attention impairment (select first level of impairment): Sustained attention Executive functioning impairment (select all impairments): Organization, Initiation, Sequencing, Reasoning, Problem  solving OT - Cognition Comments: Pt's daughter reports short-term memory deficits at baseline and mild to  moderate difficulty with organization, reasoning, problem solving, and working memory. At baseline, per daughter's report, pt has no deficits related to sequencing familiar tasks, orientation, or declaritive long-term memory.                 Following commands: Impaired Following commands impaired: Follows one step commands inconsistently, Follows one step commands with increased time     Cueing  General Comments   Cueing Techniques: Verbal cues;Gestural cues;Tactile cues;Visual cues  Positioned pillows to support pt to maintain neutral alignment in bed. Discussed with pt's daughter the importance of regular turns to aid with skin intergrity health.   Exercises     Shoulder Instructions      Home Living Family/patient expects to be discharged to:: Assisted living                             Home Equipment: Rollator (4 wheels)          Prior Functioning/Environment Prior Level of Function : Independent/Modified Independent             Mobility Comments: Mod I. Ambulates with rollator. No falls in the past 79mo. ADLs Comments: Staff provides Supervision for ADLs. Staff manages medications. Pt goes down to the dining hall for meals and goes on outing with her family.    OT Problem List: Decreased strength;Decreased activity tolerance;Impaired balance (sitting and/or standing);Decreased coordination;Decreased cognition;Decreased safety awareness;Decreased knowledge of use of DME or AE;Decreased knowledge of precautions;Pain;Impaired UE functional use   OT Treatment/Interventions: Self-care/ADL training;Therapeutic exercise;DME and/or AE instruction;Therapeutic activities;Cognitive remediation/compensation;Patient/family education;Balance training      OT Goals(Current goals can be found in the care plan section)   Acute Rehab OT Goals Patient Stated  Goal: pt unable to state OT Goal Formulation: With family Time For Goal Achievement: 04/12/24 Potential to Achieve Goals: Good ADL Goals Pt Will Perform Eating: with min assist;sitting (with adaptive equipment as needed) Pt Will Perform Grooming: with min assist;sitting (with adaptive equipment as needed) Pt Will Perform Upper Body Dressing: sitting;with mod assist Additional ADL Goal #1: Patient will demonstrate ability to accurately follow 1 step commands in 4/5 opportunities to increase safety and independence with funcitonal tasks.   OT Frequency:  Min 2X/week    Co-evaluation PT/OT/SLP Co-Evaluation/Treatment: Yes Reason for Co-Treatment: Complexity of the patient's impairments (multi-system involvement);For patient/therapist safety;To address functional/ADL transfers PT goals addressed during session: Mobility/safety with mobility;Balance OT goals addressed during session: ADL's and self-care;Other (comment) (cognition)      AM-PAC OT 6 Clicks Daily Activity     Outcome Measure Help from another person eating meals?: A Lot Help from another person taking care of personal grooming?: A Lot Help from another person toileting, which includes using toliet, bedpan, or urinal?: Total Help from another person bathing (including washing, rinsing, drying)?: Total Help from another person to put on and taking off regular upper body clothing?: Total Help from another person to put on and taking off regular lower body clothing?: Total 6 Click Score: 8   End of Session Equipment Utilized During Treatment: Oxygen (2L continuous O2 through nasal cannula) Nurse Communication: Mobility status;Need for lift equipment  Activity Tolerance: Patient limited by lethargy;No increased pain Patient left: in bed;with call bell/phone within reach;with bed alarm set;with family/visitor present  OT Visit Diagnosis: Unsteadiness on feet (R26.81);Other abnormalities of gait and mobility (R26.89);Muscle  weakness (generalized) (M62.81);Ataxia, unspecified (R27.0);Apraxia (R48.2);Other symptoms and signs involving cognitive function;Cognitive communication deficit (R41.841);Pain Symptoms and  signs involving cognitive functions: Cerebral infarction                Time: 8942-8876 OT Time Calculation (min): 26 min Charges:  OT General Charges $OT Visit: 1 Visit OT Evaluation $OT Eval Moderate Complexity: 1 Mod  Linda [name] HERO., OTR/L, MA Acute Rehab [phone]   Linda [name] 03/29/2024, 1:59 PM

## 2024-03-29 NOTE — Progress Notes (Signed)
 Orthopedic Surgery Progress Note   Assessment: Patient is a 89 y.o. female with left pertrochanteric femur fracture status post CMN   Plan: -Operative plans: complete -Diet: regular -Antibiotics: ancef  x2 post-op doses -Weight bearing status: as tolerated -PT evaluate and treat -Pain control -Dispo: per primary  ___________________________________________________________________________  Subjective: Patient sleeping this morning. Pain controlled this AM but patient not very responsive. Per her daughter, she becomes somnolent with 5mg  oxycodone . Is going to try 2.5mg  to see if that gives her adequate pain relief without overly sedating her.    Physical Exam:  General: no acute distress, appears stated age Neurologic: sleeping comfortably Respiratory: unlabored breathing on room air, symmetric chest rise  MSK:   -Left lower extremity  Dressings over thigh c/d/i Foot warm and well perfused   Yesterday's total administered Morphine  Milligram Equivalents: 18   Patient name: Linda [name] Patient MRN: [id number] Date: 03/29/24

## 2024-03-29 NOTE — Plan of Care (Signed)

## 2024-03-29 NOTE — Plan of Care (Signed)
 Pt daughter has requested that NIH scale be discontinued, states that it only agitates her mom and it doesn't produce any results

## 2024-03-29 NOTE — Progress Notes (Signed)
 Echocardiogram 2D Echocardiogram has been attempted. Pt refusing, saying no, its not okay repeatedly, pushing my hand away from chest.  Tinnie FORBES Gosling RDCS 03/29/2024, 3:14 PM

## 2024-03-29 NOTE — Progress Notes (Signed)
 PROGRESS NOTE    Linda [name]  FMW:[id number] DOB: 1924/04/16 DOA: 03/25/2024 PCP: Kennyth Worth HERO, MD    Brief Narrative:   89 y.o. female with medical history significant for COPD, essential hypertension, hyperlipidemia, who is admitted to [facility] on 03/25/2024 with acute left intertrochanteric femur fracture after ground-level mechanical fall at assisted living after presenting from home to [facility] ED complaining of fall. Status post ORIF 8/13.  Postop management, wound care, pain control, postop therapy recommendations per their service.    Assessment & Plan:  Principal Problem:   Closed left hip fracture (HCC) Active Problems:   HLD (hyperlipidemia)   Fall at home, initial encounter   Acute hip pain, left   History of COPD   History of essential hypertension   Mechanical fall with acute left-sided intertrochanteric femur fracture - Status post ORIF 8/13.  Postop management, wound care, pain control, postop therapy recommendations per their service.  -PT/OT  Acute blood loss anemia, postop vs dilutional.  - Hemoglobin baseline 10.8, trended down to 6.8 requiring 1 unit PRBC transfusion.  Hemoglobin now 8.1.  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  Had some vitamin B12 deficiency but MRI performed this morning shows acute patchy watershed CVA.  Will consult neurology team. -Concerns of occult CVA in the past.  She had completed 3 weeks of DAPT now on aspirin  alone.  Previously elected not to proceed with any kind of long-term monitoring  Vitamin B12 deficiency - Start supplements  Hypophosphatemia -replete prn  COPD - As needed bronchodilators  Essential hypertension - IV as needed  Hyperlipidemia -Currently on hold  Foley placed due to urinary retention.  Urine appears to be a better color now   DVT prophylaxis: SCDs Start: 03/25/24 2017 Code = DNR/DNI Family Communication: Daughter  present at bedside Status is: Inpatient Remains inpatient appropriate because: Therapy evaluation  Subjective: Per daughter patient is slightly more responsive over the last 12 hours. Pain medication adjusted after discussing with the daughter.  They are also agreeable to discuss her care with palliative care services Examination:  General exam: Appears calm and comfortable, elderly frail and cachectic, drowsy but easily arousable Respiratory system: Clear to auscultation. Respiratory effort normal. Cardiovascular system: S1 & S2 heard, RRR. No JVD, murmurs, rubs, gallops or clicks. No pedal edema. Gastrointestinal system: Abdomen is nondistended, soft and nontender. No organomegaly or masses felt. Normal bowel sounds heard. Central nervous system: Alert and oriented. No focal neurological deficits. Extremities: Symmetric 4 x 5 power. Skin: Surgical dressing noted Psychiatry: Judgement and insight appear poor                Diet Orders (From admission, onward)     Start     Ordered   03/28/24 1536  DIET - DYS 1 Room service appropriate? No; Fluid consistency: Thin  Diet effective now       Question Answer Comment  Room service appropriate? No   Fluid consistency: Thin      03/28/24 1535            Objective: Vitals:   03/28/24 2011 03/29/24 0500 03/29/24 0523 03/29/24 0844  BP: (!) 140/51  (!) 161/57 (!) 168/46  Pulse: 92  88 92  Resp: 14  14 16   Temp: 98.4 F (36.9 C)  98.8 F (37.1 C) 97.9 F (36.6 C)  TempSrc:   Oral Oral  SpO2: 98%  95% 98%  Weight:  59 kg  Height:        Intake/Output Summary (Last 24 hours) at 03/29/2024 1131 Last data filed at 03/28/2024 1700 Gross per 24 hour  Intake 390.91 ml  Output 550 ml  Net -159.09 ml   Filed Weights   03/25/24 2248 03/29/24 0500  Weight: 55.7 kg 59 kg    Scheduled Meds:  acetaminophen   1,000 mg Oral Q8H   artificial tears  1 drop Both Eyes QID   Chlorhexidine  Gluconate Cloth  6 each Topical  Daily   vitamin B-12  1,000 mcg Oral Daily   famotidine   20 mg Oral BID   feeding supplement  237 mL Oral BID BM   feeding supplement  237 mL Oral BID BM   lidocaine   2 patch Transdermal Q24H   mupirocin  ointment  1 Application Nasal BID   mouth rinse  15 mL Mouth Rinse 4 times per day   polyethylene glycol  17 g Oral Daily   rosuvastatin   20 mg Oral Daily   sotalol   80 mg Oral Q12H   timolol   1 drop Both Eyes Daily   traMADol   50 mg Oral BID   Continuous Infusions:  tranexamic acid       Nutritional status     Body mass index is 22.33 kg/m.  Data Reviewed:   CBC: Recent Labs  Lab 03/25/24 1751 03/26/24 0413 03/27/24 0537 03/27/24 1702 03/28/24 0637 03/29/24 0957  WBC 8.2 11.2* 8.8  --  10.8* 9.3  NEUTROABS 5.7 8.7*  --   --   --   --   HGB 11.8* 10.8* 6.8* 7.8* 8.1* 8.0*  HCT 37.2 32.4* 20.0* 23.4* 23.5* 24.4*  MCV 99.2 94.5 95.2  --  91.1 93.5  PLT 217 216 124*  --  122* 177   Basic Metabolic Panel: Recent Labs  Lab 03/25/24 1751 03/26/24 0413 03/27/24 0537 03/28/24 0637 03/29/24 0738  NA 135 135 132* 130* 131*  K 3.9 4.3 3.5 4.0 4.1  CL 104 100 102 99 101  CO2 22 26 24 26 23   GLUCOSE 133* 144* 157* 133* 165*  BUN 10 10 11 12 9   CREATININE 0.64 0.58 0.63 0.61 0.51  CALCIUM  9.6 10.1 9.0 9.1 9.3  MG 2.0 1.9 1.7 1.8 2.1  PHOS  --   --  2.2*  --   --    GFR: Estimated Creatinine Clearance: 33.9 mL/min (by C-G formula based on SCr of 0.51 mg/dL). Liver Function Tests: Recent Labs  Lab 03/25/24 1751 03/26/24 0413  AST 19 19  ALT 11 11  ALKPHOS 97 92  BILITOT 0.3 0.6  PROT 6.2* 5.9*  ALBUMIN  3.3* 3.1*   No results for input(s): LIPASE, AMYLASE in the last 168 hours. Recent Labs  Lab 03/28/24 0854  AMMONIA <13   Coagulation Profile: Recent Labs  Lab 03/26/24 0413  INR 1.0   Cardiac Enzymes: No results for input(s): CKTOTAL, CKMB, CKMBINDEX, TROPONINI in the last 168 hours. BNP (last 3 results) No results for input(s):  PROBNP in the last 8760 hours. HbA1C: Recent Labs    03/29/24 0738  HGBA1C 5.3   CBG: No results for input(s): GLUCAP in the last 168 hours. Lipid Profile: Recent Labs    03/29/24 0738  CHOL 100  HDL 41  LDLCALC 42  TRIG 86  CHOLHDL 2.4   Thyroid  Function Tests: Recent Labs    03/28/24 0854  TSH 1.073   Anemia Panel: Recent Labs    03/28/24 0854  VITAMINB12 174*   Sepsis  Labs: No results for input(s): PROCALCITON, LATICACIDVEN in the last 168 hours.  Recent Results (from the past 240 hours)  Surgical PCR screen     Status: None   Collection Time: 03/26/24  3:52 AM   Specimen: Nasal Mucosa; Nasal Swab  Result Value Ref Range Status   MRSA, PCR NEGATIVE NEGATIVE Final   Staphylococcus aureus NEGATIVE NEGATIVE Final    Comment: (NOTE) The Xpert SA Assay (FDA approved for NASAL specimens in patients 83 years of age and older), is one component of a comprehensive surveillance program. It is not intended to diagnose infection nor to guide or monitor treatment. Performed at [facility], [address] [place], [place] [address]          Radiology Studies: CT ANGIO HEAD NECK W WO CM Result Date: 03/29/2024 EXAM: CTA Head and Neck without and with Intravenous Contrast. CT Head without Contrast. CLINICAL HISTORY: Stroke/TIA, determine embolic source. TECHNIQUE: Axial CTA images of the head and neck performed with intravenous contrast. MIP reconstructed images were created and reviewed. Axial computed tomography images of the head/brain performed without intravenous contrast. Note: Per PQRS, the description of internal carotid artery percent stenosis, including 0 percent or normal exam, is based on [place] American Symptomatic Carotid Endarterectomy Trial (NASCET) criteria. Dose reduction technique was used including one or more of the following: automated exposure control, adjustment of mA and kV according to patient size, and/or iterative reconstruction.  CONTRAST: 75mL (iohexol  (OMNIPAQUE ) 350 MG/ML injection 75 mL IOHEXOL  350 MG/ML SOLN) COMPARISON: 03/25/24 and 09/22/21 FINDINGS: CT HEAD: BRAIN: Mild chronic ischemic white matter changes. Mild volume loss. No acute intraparenchymal hemorrhage. No mass lesion. No CT evidence for acute territorial infarct. No midline shift or extra-axial collection. VENTRICLES: No hydrocephalus. ORBITS: The orbits are unremarkable. SINUSES AND MASTOIDS: Left sphenoid sinus opacification. CTA NECK: COMMON CAROTID ARTERIES: No significant stenosis. No dissection or occlusion. Calcific aortic atherosclerosis. INTERNAL CAROTID ARTERIES: Atherosclerotic calcifications of the cavernous and clinoid segments of the internal carotid arteries at the skull base. No hemodynamically significant stenosis by NASCET criteria. VERTEBRAL ARTERIES: No significant stenosis. No dissection or occlusion. CTA HEAD: ANTERIOR CEREBRAL ARTERIES: No significant stenosis. No occlusion. No aneurysm. MIDDLE CEREBRAL ARTERIES: There is unchanged occlusion of the left MCA M2 branch (series 9 image 93). Distal M2 branches are patent. POSTERIOR CEREBRAL ARTERIES: Atherosclerotic irregularity of both posterior cerebral arteries without high-grade stenosis. No occlusion. No aneurysm. BASILAR ARTERY: No significant stenosis. No occlusion. No aneurysm. OTHER: Heterogeneous and enlarged thyroid  gland that extends into the upper mediastinum. SOFT TISSUES: No acute finding. No masses or lymphadenopathy. IMPRESSION: 1. Unchanged occlusion of a left MCA M2 branch. Distal M2 branches are patent. 2. Atherosclerotic irregularity of both posterior cerebral arteries without high-grade stenosis. 3. Calcific atherosclerosis of both carotid bifurcations without hemodynamically significant stenosis by NASCET criteria. 4. Atherosclerotic calcifications of the cavernous and clinoid segments of the internal carotid arteries at the skull base. Electronically signed by: Franky Stanford MD  03/29/2024 03:31 AM EDT RP Workstation: [id number]   MR BRAIN WO CONTRAST Result Date: 03/28/2024 CLINICAL DATA:  89 year old female with altered mental status. Recent fall. EXAM: MRI HEAD WITHOUT CONTRAST TECHNIQUE: Multiplanar, multiecho pulse sequences of the brain and surrounding structures were obtained without intravenous contrast. COMPARISON:  Head CT 03/25/2024.  Brain MRI 01/11/2023. FINDINGS: Brain: Patchy restricted diffusion anterior left frontal lobe central and subcortical white matter (series 5, image 81). Associated T2 and FLAIR hyperintensity. No hemorrhage or mass effect. No other diffusion restriction.  Chronic signal abnormality in the cerebellum, encephalomalacia and chronic microhemorrhage on the left (series 12, image 15). Vague chronic T2 hyperintensity in the right hemisphere at the junction of the cerebellar peduncle, perhaps Wallerian degeneration), stable. Other patchy cerebral white matter T2 and FLAIR hyperintensity is stable and generally mild for age. No chronic cerebral cortical encephalomalacia identified. And no other chronic cerebral blood products on SWI. Stable cerebral volume. No midline shift, mass effect, evidence of mass lesion, ventriculomegaly, or acute intracranial hemorrhage. Cervicomedullary junction and pituitary are within normal limits. Vascular: Major intracranial vascular flow voids are stable since last year. Skull and upper cervical spine: Negative. Visualized bone marrow signal is within normal limits. Sinuses/Orbits: Stable. Other: Visible internal auditory structures appear normal. IMPRESSION: 1. Positive for patchy acute infarcts in the anterior Left frontal lobe white matter, ACA/MCA watershed area. No associated hemorrhage or mass effect. 2. No other acute intracranial abnormality. Chronic signal changes in the bilateral cerebellum suggestive of combined chronic hemorrhage, encephalomalacia, Wallerian degeneration. Electronically Signed   By: VEAR Hurst M.D.    On: 03/28/2024 11:46           LOS: 4 days   Time spent= 35 mins    Burgess JAYSON Dare, MD [facility]  If 7PM-7AM, please contact night-coverage  03/29/2024, 11:31 AM

## 2024-03-29 NOTE — Progress Notes (Signed)
 Speech Language Pathology Treatment: Dysphagia  Patient Details Name: Linda [name] MRN: [id number] DOB: March 07, 1924 Today's Date: 03/29/2024 Time: 8572-8556 SLP Time Calculation (min) (ACUTE ONLY): 16 min  Assessment / Plan / Recommendation Clinical Impression  Pt seen for f/u dysphagia tx with daughter in attendance for session.  Pt required mod verbal/tactile cues to swallow puree consistency with oral holding noted during consumption.  Pt confused and min cooperative with lethargy impacting performance cumulatively.  Attempted thin via straw with pt self-feeding with A from SLP/daughter and unable to obtain negative pressure to consume via straw and/or refused consistency.  Daughter stated her prior meals (breakfast/lunch), she consumed a portion (1/4-1/2) and did not cough or exhibit any s/s of dysphagia per report.  Nursing stated she had been more alert this date.  Daughter reports periods of lucidity, but she is unable to participate in conversation fully d/t CVA and deconditioning.  She required some A prior to sx, but was relatively independent and ate a regular/thin liquid diet.  ST will continue to f/u for diet progression/dysphagia tx in acute setting.  Continue current diet of Dysphagia 1(puree)/thin liquids d/t pt waxing/waning mentation.    HPI HPI: 89 yo female admitted 8/12 after ground level mechanical fall at ALF resulting in acute left intertrochanteric femur fracture, s/p ORIF 8/13. Pt with increased decreased level of alertness prompting MRI 8/15 that revealed patchy acute infarcts in the anterior L frontal lobe matter, ACA/MCA watershed area. PMH includes: COPD, CVA, thyroid  disease. Previous SLP swallow evals x2 in February 2023 both suspicious for esophageal dysphagia with functional appearing oropharyngeal swallow. Esophagram in 2023 revealed mild esophageal dysmotility, which was not unexpected for age, but did result in some retention of contrast that resolved with subsequent  swallows. ST f/u for dysphagia tx with pt currently on Dysphagia 1/thin liquid diet.      SLP Plan  Continue with current plan of care          Recommendations  Diet recommendations: Dysphagia 1 (puree);Thin liquid Liquids provided via: Straw Medication Administration: Crushed with puree Supervision: Full supervision/cueing for compensatory strategies;Trained caregiver to feed patient Compensations: Slow rate;Small sips/bites;Multiple dry swallows after each bite/sip Postural Changes and/or Swallow Maneuvers: Seated upright 90 degrees;Upright 30-60 min after meal                  Oral care BID;Staff/trained caregiver to provide oral care     Dysphagia, unspecified (R13.10)     Continue with current plan of care     Pat Ashlea Dusing,M.S.,CCC-SLP  03/29/2024, 3:01 PM

## 2024-03-30 ENCOUNTER — Inpatient Hospital Stay (HOSPITAL_COMMUNITY)

## 2024-03-30 DIAGNOSIS — Z66 Do not resuscitate: Secondary | ICD-10-CM | POA: Diagnosis not present

## 2024-03-30 DIAGNOSIS — I6389 Other cerebral infarction: Secondary | ICD-10-CM | POA: Diagnosis not present

## 2024-03-30 DIAGNOSIS — Z711 Person with feared health complaint in whom no diagnosis is made: Secondary | ICD-10-CM | POA: Diagnosis not present

## 2024-03-30 DIAGNOSIS — Z7189 Other specified counseling: Secondary | ICD-10-CM

## 2024-03-30 DIAGNOSIS — S72102K Unspecified trochanteric fracture of left femur, subsequent encounter for closed fracture with nonunion: Secondary | ICD-10-CM | POA: Diagnosis not present

## 2024-03-30 DIAGNOSIS — Z515 Encounter for palliative care: Secondary | ICD-10-CM

## 2024-03-30 LAB — CBC
HCT: 23.4 % — ABNORMAL LOW (ref 36.0–46.0)
Hemoglobin: 7.6 g/dL — ABNORMAL LOW (ref 12.0–15.0)
MCH: 30.8 pg (ref 26.0–34.0)
MCHC: 32.5 g/dL (ref 30.0–36.0)
MCV: 94.7 fL (ref 80.0–100.0)
Platelets: 201 K/uL (ref 150–400)
RBC: 2.47 MIL/uL — ABNORMAL LOW (ref 3.87–5.11)
RDW: 14.3 % (ref 11.5–15.5)
WBC: 7.8 K/uL (ref 4.0–10.5)
nRBC: 0 % (ref 0.0–0.2)

## 2024-03-30 LAB — MAGNESIUM: Magnesium: 2.1 mg/dL (ref 1.7–2.4)

## 2024-03-30 LAB — BASIC METABOLIC PANEL WITH GFR
Anion gap: 6 (ref 5–15)
BUN: 16 mg/dL (ref 8–23)
CO2: 27 mmol/L (ref 22–32)
Calcium: 9.2 mg/dL (ref 8.9–10.3)
Chloride: 101 mmol/L (ref 98–111)
Creatinine, Ser: 0.59 mg/dL (ref 0.44–1.00)
GFR, Estimated: >60 mL/min (ref >60)
Glucose, Bld: 118 mg/dL — ABNORMAL HIGH (ref 70–99)
Potassium: 3.9 mmol/L (ref 3.5–5.1)
Sodium: 134 mmol/L — ABNORMAL LOW (ref 135–145)

## 2024-03-30 LAB — ECHOCARDIOGRAM COMPLETE
AR max vel: 1.64 cm2
AV Area VTI: 1.6 cm2
AV Area mean vel: 1.67 cm2
AV Mean grad: 7.5 mmHg
AV Peak grad: 14.6 mmHg
Ao pk vel: 1.91 m/s
Area-P 1/2: 2.73 cm2
Height: 64 in
S' Lateral: 1.2 cm
Weight: 1813.06 [oz_av]

## 2024-03-30 MED ORDER — SENNOSIDES-DOCUSATE SODIUM 8.6-50 MG PO TABS
1.0000 | ORAL_TABLET | Freq: Every day | ORAL | Status: DC
  Administered 2024-03-30 – 2024-03-31 (×2): 1 via ORAL
  Filled 2024-03-30 (×2): qty 1

## 2024-03-30 NOTE — Plan of Care (Signed)
  Problem: Nutrition: Goal: Adequate nutrition will be maintained Outcome: Not Progressing   Problem: Coping: Goal: Level of anxiety will decrease Outcome: Progressing   Problem: Elimination: Goal: Will not experience complications related to urinary retention Outcome: Progressing   Problem: Pain Managment: Goal: General experience of comfort will improve and/or be controlled Outcome: Progressing   Problem: Safety: Goal: Ability to remain free from injury will improve Outcome: Progressing

## 2024-03-30 NOTE — NC FL2 (Signed)
 [place]  MEDICAID FL2 LEVEL OF CARE FORM     IDENTIFICATION  Patient Name: Linda [name] Birthdate: 06-Nov-1923 Sex: female Admission Date (Current Location): 03/25/2024  [facility] and [place] Number:  Producer, television/film/video and Address:  [facility], [address] [place], [place] [address]      Provider Number:    Attending Physician Name and Address:  Caleen Burgess BROCKS, MD  Relative Name and Phone Number:  [place]rolyn, Behrle (Daughter)  [phone] (Mobile)    Current Level of Care: Hospital Recommended Level of Care: Skilled Nursing Facility Prior Approval Number:    Date Approved/Denied:   PASRR Number: [id number]  Discharge Plan: SNF    Current Diagnoses: Patient Active Problem List   Diagnosis Date Noted   Fall at home, initial encounter 03/26/2024   Acute hip pain, left 03/26/2024   History of COPD 03/26/2024   History of essential hypertension 03/26/2024   Closed pertrochanteric fracture of proximal end of left femur (HCC) 03/25/2024   Chronic knee instability, unspecified laterality 03/12/2024   Constipation 09/13/2023   Urinary hesitancy 01/05/2023   Insomnia 05/18/2022   Lumbar compression fracture (HCC) 01/25/2022   COPD (chronic obstructive pulmonary disease) (HCC) 09/21/2021   Osteoarthritis 08/12/2021   Dermatitis 04/13/2021   Multinodular goiter 11/19/2020   Hypercalcemia 11/19/2020   Hyperparathyroidism (HCC) 11/19/2020   Bilateral impacted cerumen 11/08/2020   Bronchiectasis without acute exacerbation (HCC) 07/14/2019   Chronic cough 02/03/2019   Thoracic back pain 10/13/2018   HLD (hyperlipidemia) 10/13/2018   Vitamin D  deficiency 10/13/2018   Gastroesophageal reflux disease without esophagitis 10/13/2018   Chronic midline thoracic back pain 10/13/2018   Osteoporosis without current pathological fracture 10/13/2018   HTN (hypertension) 10/13/2018   Stenosis of carotid artery 10/13/2018   History of thyroid  nodule, s/p  bx, benign 10/13/2018   Aortic cusp regurgitation 10/13/2018   Coccyalgia 10/13/2018   PAT (paroxysmal atrial tachycardia) (HCC) 10/13/2018    Orientation RESPIRATION BLADDER Height & Weight     Self, Place  Normal Incontinent, Indwelling catheter Weight: 113 lb 5.1 oz (51.4 kg) Height:  5' 4 (162.6 cm)  BEHAVIORAL SYMPTOMS/MOOD NEUROLOGICAL BOWEL NUTRITION STATUS      Continent Diet  AMBULATORY STATUS COMMUNICATION OF NEEDS Skin   Extensive Assist Verbally Normal                       Personal Care Assistance Level of Assistance  Bathing, Feeding, Dressing Bathing Assistance: Maximum assistance Feeding assistance: Maximum assistance Dressing Assistance: Maximum assistance     Functional Limitations Info  Sight, Hearing, Speech Sight Info: Adequate Hearing Info: Impaired (wears hearing aid) Speech Info: Adequate    SPECIAL CARE FACTORS FREQUENCY  PT (By licensed PT), OT (By licensed OT)     PT Frequency: 5x per week OT Frequency: 5x per week            Contractures Contractures Info: Not present    Additional Factors Info  Code Status, Allergies Code Status Info: DNR-Limited Allergies Info: Cymbalta            Current Medications (03/30/2024):  This is the current hospital active medication list Current Facility-Administered Medications  Medication Dose Route Frequency Provider Last Rate Last Admin   acetaminophen  (TYLENOL ) tablet 1,000 mg  1,000 mg Oral Q8H Moore, Michael A, MD   1,000 mg at 03/30/24 1050   artificial tears ophthalmic solution 1 drop  1 drop Both Eyes QID Caleen Burgess BROCKS, MD  1 drop at 03/29/24 2259   Chlorhexidine  Gluconate Cloth 2 % PADS 6 each  6 each Topical Daily Amin, Ankit C, MD   6 each at 03/28/24 1201   clobetasol  ointment (TEMOVATE ) 0.05 % 1 Application  1 Application Topical Q12H PRN Amin, Ankit C, MD       cyanocobalamin  (VITAMIN B12) tablet 1,000 mcg  1,000 mcg Oral Daily Amin, Ankit C, MD   1,000 mcg at 03/30/24 1040    dextromethorphan -guaiFENesin  (MUCINEX  DM) 30-600 MG per 12 hr tablet 1 tablet  1 tablet Oral Q12H PRN Amin, Ankit C, MD       famotidine  (PEPCID ) tablet 20 mg  20 mg Oral BID Amin, Ankit C, MD   20 mg at 03/30/24 1041   feeding supplement (ENSURE PLUS HIGH PROTEIN) liquid 237 mL  237 mL Oral BID BM Amin, Ankit C, MD   237 mL at 03/29/24 1633   feeding supplement (ENSURE SURGERY) liquid 237 mL  237 mL Oral BID BM Moore, Michael A, MD       glucagon  (human recombinant) (GLUCAGEN) injection 1 mg  1 mg Intravenous PRN Georgina Ozell LABOR, MD       hydrALAZINE  (APRESOLINE ) injection 10 mg  10 mg Intravenous Q4H PRN Georgina Ozell LABOR, MD       hydrocortisone  (ANUSOL -HC) 2.5 % rectal cream 1 Application  1 Application Rectal Q12H PRN Amin, Ankit C, MD       ipratropium-albuterol  (DUONEB) 0.5-2.5 (3) MG/3ML nebulizer solution 3 mL  3 mL Nebulization Q4H PRN Georgina Ozell LABOR, MD   3 mL at 03/27/24 0012   lidocaine  (LIDODERM ) 5 % 2 patch  2 patch Transdermal Q24H Amin, Ankit C, MD   2 patch at 03/28/24 1202   melatonin tablet 3 mg  3 mg Oral QHS PRN Moore, Michael A, MD   3 mg at 03/27/24 0107   metoprolol  tartrate (LOPRESSOR ) injection 5 mg  5 mg Intravenous Q4H PRN Georgina Ozell LABOR, MD       mupirocin  ointment (BACTROBAN ) 2 % 1 Application  1 Application Nasal BID Georgina Ozell LABOR, MD   1 Application at 03/30/24 1041   naloxone  (NARCAN ) injection 0.4 mg  0.4 mg Intravenous PRN Georgina Ozell LABOR, MD       ondansetron  (ZOFRAN ) injection 4 mg  4 mg Intravenous Q6H PRN Moore, Michael A, MD   4 mg at 03/25/24 2213   Oral care mouth rinse  15 mL Mouth Rinse 4 times per day Amin, Ankit C, MD   15 mL at 03/29/24 2301   Oral care mouth rinse  15 mL Mouth Rinse PRN Amin, Ankit C, MD       oxyCODONE  (Oxy IR/ROXICODONE ) immediate release tablet 2.5-5 mg  2.5-5 mg Oral Q4H PRN Amin, Ankit C, MD   2.5 mg at 03/30/24 1040   phenol (CHLORASEPTIC) mouth spray 1 spray  1 spray Mouth/Throat PRN Chavez, Abigail, NP   1 spray at  03/27/24 0104   polyethylene glycol (MIRALAX  / GLYCOLAX ) packet 17 g  17 g Oral Daily Amin, Ankit C, MD   17 g at 03/30/24 1039   rosuvastatin  (CRESTOR ) tablet 20 mg  20 mg Oral Daily Amin, Ankit C, MD   20 mg at 03/30/24 1040   senna-docusate (Senokot-S) tablet 1 tablet  1 tablet Oral QHS PRN Amin, Ankit C, MD       sotalol  (BETAPACE ) tablet 80 mg  80 mg Oral Q12H Amin, Ankit C, MD   80 mg  at 03/30/24 1050   timolol  (TIMOPTIC ) 0.25 % ophthalmic solution 1 drop  1 drop Both Eyes Daily Georgina Ozell LABOR, MD   1 drop at 03/30/24 1058   traMADol  (ULTRAM ) tablet 50 mg  50 mg Oral BID Amin, Ankit C, MD       tranexamic acid  (CYKLOKAPRON ) IVPB 1,000 mg  1,000 mg Intravenous Once Moore, Michael A, MD         Discharge Medications: Please see discharge summary for a list of discharge medications.  Relevant Imaging Results:  Relevant Lab Results:   Additional Information SSN: [id number]  Ermalinda Penton [place], [place]

## 2024-03-30 NOTE — Progress Notes (Signed)
 PROGRESS NOTE    Linda [name]  FMW:[id number] DOB: 10-28-1923 DOA: 03/25/2024 PCP: Kennyth Worth HERO, MD    Brief Narrative:   89 y.o. female with medical history significant for COPD, essential hypertension, hyperlipidemia, who is admitted to [facility] on 03/25/2024 with acute left intertrochanteric femur fracture after ground-level mechanical fall at assisted living after presenting from home to [facility] ED complaining of fall. Status post ORIF 8/13.  Postop management, wound care, pain control, postop therapy recommendations per their service.  Postop had persistent encephalopathy therefore MRI brain was performed which showed concerns of acute CVA in the watershed area therefore neurology team was consulted.  It was eventually determined to continue her daily aspirin  and avoid any DAPT secondary to risk of anemia.   Assessment & Plan:  Principal Problem:   Closed left hip fracture (HCC) Active Problems:   HLD (hyperlipidemia)   Fall at home, initial encounter   Acute hip pain, left   History of COPD   History of essential hypertension   Mechanical fall with acute left-sided intertrochanteric femur fracture - Status post ORIF 8/13.  Postop management, wound care, pain control, postop therapy recommendations per their service.  -PT/OT  Acute blood loss anemia, postop vs dilutional.  - Hemoglobin baseline 10.8, trended down to 6.8 requiring 1 unit PRBC transfusion.  Hemoglobin now 8.1.  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  Had some vitamin B12 deficiency but MRI performed this morning shows acute patchy watershed CVA.  Seen by neurology team who has recommended continuing aspirin .  Due to recent anemia, avoiding DAPT.  Patient declining echocardiogram at this time.  If patient's day goes well enough and require something at bedtime to sleep, resume her midodrine at 7.5 mg at bedtime dose  -Concerns of occult  CVA in the past.  She had completed 3 weeks of DAPT now on aspirin  alone.  Previously elected not to proceed with any kind of long-term monitoring  Vitamin B12 deficiency - Started supplements  Hypophosphatemia -replete prn  COPD - As needed bronchodilators  Essential hypertension - IV as needed  Hyperlipidemia -Currently on hold  Foley placed due to urinary retention.  Urine appears to be a better color now   DVT prophylaxis: SCDs Start: 03/25/24 2017 Code = DNR/DNI Family Communication: Daughter present at bedside Status is: Inpatient Remains inpatient appropriate because: Therapy evaluation  Subjective: Patient has some slight agitation yesterday evening requiring Haldol . This morning she is watching TV.  Reporting of some pain.  Confused but awake.  Daughter and other family member at bedside.  I have discussed in detail regarding her care.  All the questions have been answered.  Examination:  General exam: Appears calm and comfortable, elderly frail and cachectic, drowsy but easily arousable Respiratory system: Clear to auscultation. Respiratory effort normal. Cardiovascular system: S1 & S2 heard, RRR. No JVD, murmurs, rubs, gallops or clicks. No pedal edema. Gastrointestinal system: Abdomen is nondistended, soft and nontender. No organomegaly or masses felt. Normal bowel sounds heard. Central nervous system: Alert and oriented. No focal neurological deficits. Extremities: Symmetric 4 x 5 power. Skin: Surgical dressing noted Psychiatry: Judgement and insight appear poor                Diet Orders (From admission, onward)     Start     Ordered   03/28/24 1536  DIET - DYS 1 Room service appropriate? No; Fluid consistency: Thin  Diet effective now  Question Answer Comment  Room service appropriate? No   Fluid consistency: Thin      03/28/24 1535            Objective: Vitals:   03/29/24 2100 03/30/24 0500 03/30/24 0500 03/30/24 0746  BP:  (!) 130/52  (!) 147/59 (!) 140/68  Pulse: 73  73 75  Resp:   16 16  Temp:   98.8 F (37.1 C) 98.7 F (37.1 C)  TempSrc:    Axillary  SpO2:   100% 95%  Weight:  51.4 kg    Height:        Intake/Output Summary (Last 24 hours) at 03/30/2024 1034 Last data filed at 03/29/2024 1457 Gross per 24 hour  Intake 120 ml  Output 1600 ml  Net -1480 ml   Filed Weights   03/25/24 2248 03/29/24 0500 03/30/24 0500  Weight: 55.7 kg 59 kg 51.4 kg    Scheduled Meds:  acetaminophen   1,000 mg Oral Q8H   artificial tears  1 drop Both Eyes QID   Chlorhexidine  Gluconate Cloth  6 each Topical Daily   vitamin B-12  1,000 mcg Oral Daily   famotidine   20 mg Oral BID   feeding supplement  237 mL Oral BID BM   feeding supplement  237 mL Oral BID BM   lidocaine   2 patch Transdermal Q24H   mupirocin  ointment  1 Application Nasal BID   mouth rinse  15 mL Mouth Rinse 4 times per day   polyethylene glycol  17 g Oral Daily   rosuvastatin   20 mg Oral Daily   sotalol   80 mg Oral Q12H   timolol   1 drop Both Eyes Daily   traMADol   50 mg Oral BID   Continuous Infusions:  tranexamic acid       Nutritional status     Body mass index is 19.45 kg/m.  Data Reviewed:   CBC: Recent Labs  Lab 03/25/24 1751 03/26/24 0413 03/27/24 0537 03/27/24 1702 03/28/24 0637 03/29/24 0957 03/30/24 0658  WBC 8.2 11.2* 8.8  --  10.8* 9.3 7.8  NEUTROABS 5.7 8.7*  --   --   --   --   --   HGB 11.8* 10.8* 6.8* 7.8* 8.1* 8.0* 7.6*  HCT 37.2 32.4* 20.0* 23.4* 23.5* 24.4* 23.4*  MCV 99.2 94.5 95.2  --  91.1 93.5 94.7  PLT 217 216 124*  --  122* 177 201   Basic Metabolic Panel: Recent Labs  Lab 03/26/24 0413 03/27/24 0537 03/28/24 0637 03/29/24 0738 03/30/24 0658  NA 135 132* 130* 131* 134*  K 4.3 3.5 4.0 4.1 3.9  CL 100 102 99 101 101  CO2 26 24 26 23 27   GLUCOSE 144* 157* 133* 165* 118*  BUN 10 11 12 9 16   CREATININE 0.58 0.63 0.61 0.51 0.59  CALCIUM  10.1 9.0 9.1 9.3 9.2  MG 1.9 1.7 1.8 2.1 2.1  PHOS   --  2.2*  --   --   --    GFR: Estimated Creatinine Clearance: 31.9 mL/min (by C-G formula based on SCr of 0.59 mg/dL). Liver Function Tests: Recent Labs  Lab 03/25/24 1751 03/26/24 0413  AST 19 19  ALT 11 11  ALKPHOS 97 92  BILITOT 0.3 0.6  PROT 6.2* 5.9*  ALBUMIN  3.3* 3.1*   No results for input(s): LIPASE, AMYLASE in the last 168 hours. Recent Labs  Lab 03/28/24 0854  AMMONIA <13   Coagulation Profile: Recent Labs  Lab 03/26/24 0413  INR 1.0  Cardiac Enzymes: No results for input(s): CKTOTAL, CKMB, CKMBINDEX, TROPONINI in the last 168 hours. BNP (last 3 results) No results for input(s): PROBNP in the last 8760 hours. HbA1C: Recent Labs    03/29/24 0738  HGBA1C 5.3   CBG: No results for input(s): GLUCAP in the last 168 hours. Lipid Profile: Recent Labs    03/29/24 0738  CHOL 100  HDL 41  LDLCALC 42  TRIG 86  CHOLHDL 2.4   Thyroid  Function Tests: Recent Labs    03/28/24 0854  TSH 1.073   Anemia Panel: Recent Labs    03/28/24 0854  VITAMINB12 174*   Sepsis Labs: No results for input(s): PROCALCITON, LATICACIDVEN in the last 168 hours.  Recent Results (from the past 240 hours)  Surgical PCR screen     Status: None   Collection Time: 03/26/24  3:52 AM   Specimen: Nasal Mucosa; Nasal Swab  Result Value Ref Range Status   MRSA, PCR NEGATIVE NEGATIVE Final   Staphylococcus aureus NEGATIVE NEGATIVE Final    Comment: (NOTE) The Xpert SA Assay (FDA approved for NASAL specimens in patients 60 years of age and older), is one component of a comprehensive surveillance program. It is not intended to diagnose infection nor to guide or monitor treatment. Performed at [facility], [address], [place], [place] [address]          Radiology Studies: CT ANGIO HEAD NECK W WO CM Result Date: 03/29/2024 EXAM: CTA Head and Neck without and with Intravenous Contrast. CT Head without Contrast. CLINICAL HISTORY: Stroke/TIA,  determine embolic source. TECHNIQUE: Axial CTA images of the head and neck performed with intravenous contrast. MIP reconstructed images were created and reviewed. Axial computed tomography images of the head/brain performed without intravenous contrast. Note: Per PQRS, the description of internal carotid artery percent stenosis, including 0 percent or normal exam, is based on [place] American Symptomatic Carotid Endarterectomy Trial (NASCET) criteria. Dose reduction technique was used including one or more of the following: automated exposure control, adjustment of mA and kV according to patient size, and/or iterative reconstruction. CONTRAST: 75mL (iohexol  (OMNIPAQUE ) 350 MG/ML injection 75 mL IOHEXOL  350 MG/ML SOLN) COMPARISON: 03/25/24 and 09/22/21 FINDINGS: CT HEAD: BRAIN: Mild chronic ischemic white matter changes. Mild volume loss. No acute intraparenchymal hemorrhage. No mass lesion. No CT evidence for acute territorial infarct. No midline shift or extra-axial collection. VENTRICLES: No hydrocephalus. ORBITS: The orbits are unremarkable. SINUSES AND MASTOIDS: Left sphenoid sinus opacification. CTA NECK: COMMON CAROTID ARTERIES: No significant stenosis. No dissection or occlusion. Calcific aortic atherosclerosis. INTERNAL CAROTID ARTERIES: Atherosclerotic calcifications of the cavernous and clinoid segments of the internal carotid arteries at the skull base. No hemodynamically significant stenosis by NASCET criteria. VERTEBRAL ARTERIES: No significant stenosis. No dissection or occlusion. CTA HEAD: ANTERIOR CEREBRAL ARTERIES: No significant stenosis. No occlusion. No aneurysm. MIDDLE CEREBRAL ARTERIES: There is unchanged occlusion of the left MCA M2 branch (series 9 image 93). Distal M2 branches are patent. POSTERIOR CEREBRAL ARTERIES: Atherosclerotic irregularity of both posterior cerebral arteries without high-grade stenosis. No occlusion. No aneurysm. BASILAR ARTERY: No significant stenosis. No occlusion. No  aneurysm. OTHER: Heterogeneous and enlarged thyroid  gland that extends into the upper mediastinum. SOFT TISSUES: No acute finding. No masses or lymphadenopathy. IMPRESSION: 1. Unchanged occlusion of a left MCA M2 branch. Distal M2 branches are patent. 2. Atherosclerotic irregularity of both posterior cerebral arteries without high-grade stenosis. 3. Calcific atherosclerosis of both carotid bifurcations without hemodynamically significant stenosis by NASCET criteria. 4. Atherosclerotic calcifications of  the cavernous and clinoid segments of the internal carotid arteries at the skull base. Electronically signed by: Franky Stanford MD 03/29/2024 03:31 AM EDT RP Workstation: [id number]   MR BRAIN WO CONTRAST Result Date: 03/28/2024 CLINICAL DATA:  89 year old female with altered mental status. Recent fall. EXAM: MRI HEAD WITHOUT CONTRAST TECHNIQUE: Multiplanar, multiecho pulse sequences of the brain and surrounding structures were obtained without intravenous contrast. COMPARISON:  Head CT 03/25/2024.  Brain MRI 01/11/2023. FINDINGS: Brain: Patchy restricted diffusion anterior left frontal lobe central and subcortical white matter (series 5, image 81). Associated T2 and FLAIR hyperintensity. No hemorrhage or mass effect. No other diffusion restriction. Chronic signal abnormality in the cerebellum, encephalomalacia and chronic microhemorrhage on the left (series 12, image 15). Vague chronic T2 hyperintensity in the right hemisphere at the junction of the cerebellar peduncle, perhaps Wallerian degeneration), stable. Other patchy cerebral white matter T2 and FLAIR hyperintensity is stable and generally mild for age. No chronic cerebral cortical encephalomalacia identified. And no other chronic cerebral blood products on SWI. Stable cerebral volume. No midline shift, mass effect, evidence of mass lesion, ventriculomegaly, or acute intracranial hemorrhage. Cervicomedullary junction and pituitary are within normal limits.  Vascular: Major intracranial vascular flow voids are stable since last year. Skull and upper cervical spine: Negative. Visualized bone marrow signal is within normal limits. Sinuses/Orbits: Stable. Other: Visible internal auditory structures appear normal. IMPRESSION: 1. Positive for patchy acute infarcts in the anterior Left frontal lobe white matter, ACA/MCA watershed area. No associated hemorrhage or mass effect. 2. No other acute intracranial abnormality. Chronic signal changes in the bilateral cerebellum suggestive of combined chronic hemorrhage, encephalomalacia, Wallerian degeneration. Electronically Signed   By: VEAR Hurst M.D.   On: 03/28/2024 11:46           LOS: 5 days   Time spent= 35 mins    Burgess JAYSON Dare, MD [facility]  If 7PM-7AM, please contact night-coverage  03/30/2024, 10:34 AM

## 2024-03-30 NOTE — Progress Notes (Signed)
 Echocardiogram 2D Echocardiogram has been performed.  Linda [name] 03/30/2024, 11:13 AM

## 2024-03-30 NOTE — Consult Note (Addendum)
 Palliative Medicine Inpatient Consult Note  Consulting Provider: Dr. Caleen  Reason for consult:   Palliative Care Consult Services Palliative Medicine Consult  Reason for Consult? ORIF and now acute CVA   03/30/2024  HPI:  Per intake H&P --> 89 y.o. female with medical history significant for COPD, essential hypertension, hyperlipidemia, L hip fracture, fall(s), previous stroke. The PMT has been asked to support additional goals of care in the setting of an acute CVA.   Clinical Assessment/Goals of Care:  *Please note that this is a verbal dictation therefore any spelling or grammatical errors are due to the Dragon Medical One system interpretation.  I have reviewed medical records including EPIC notes, labs and imaging, received report from bedside RN, assessed the patient who is laying in bed aware of self, generally ill appearing.    I met with Story' daughter, Channing and  to further discuss diagnosis prognosis, GOC, EOL wishes, disposition and options.   I introduced Palliative Medicine as specialized medical care for people living with serious illness. It focuses on providing relief from the symptoms and stress of a serious illness. The goal is to improve quality of life for both the patient and the family.  Medical History Review and Understanding:  A review of Cariana past medical history for COPD, hypertension, hyperlipidemia, stroke(s), arthritis, glaucoma, and thyroid  disease was completed  Social History:  Raeven lives in [place]  at [facility] assisted living.  She is a widow.  She has 1 daughter, Channing.  She formally worked as a Hydrologist.  She enjoys being social and takes pride in her appearance.  Functional and Nutritional State:  Prior to admission Ebone utilized her rollator in the morning and in the evening walking around her facility.  She was able to dress herself and perform the majority of B ADLs though did require help with  bathing and did have meals provided at [facility].  She did have a good appetite preceding admission.  Advance Directives:  A detailed discussion was had today regarding advanced directives.  Copy of updated advance directives has been obtained and will be scanned into the medical record.  Code Status:  Concepts specific to code status, artifical feeding and hydration, continued IV antibiotics and rehospitalization was had.  The difference between a aggressive medical intervention path  and a palliative comfort care path for this patient at this time was had.   Darcee is an established DO NOT RESUSCITATE DO NOT INTUBATE CODE STATUS.  A MOST form was provided for review and completion.  Discussion:  Discussions were held in the setting of Siana's fall, as well as her new stroke.  We reviewed patient's prior function and present function.  We discussed hospital-acquired delirium and how that is likely weighing into the patient's altered mental state and increased sleepiness.  We reviewed best case and worst-case scenarios in the setting of patient's left hip fracture best case being she is able to rehabilitate well and go back to [facility] perhaps at a minimally decreased level of functioning.  We reviewed worst case scenario whereby she goes to skilled nursing and continues to decline and/or does not have the mental capacity to take the physical therapist instructions.  We discussed options if Farzana should neglect to improve inclusive of comfort focused care as well as hospice.  At this time patient's family would like to see how she can do in the skilled nursing facility and make a determination after touring on if they would  want palliative care to follow or not.  Discussed the importance of continued conversation with family and their  medical providers regarding overall plan of care and treatment options, ensuring decisions are within the context of the patients values and  GOCs.  Decision Maker: Infinger,Cheryl (Daughter): [phone] (Mobile)   SUMMARY OF RECOMMENDATIONS   DNAR/DNI  A goal DNR form is completed and in the front of the chart  A MOST provided to patient's daughter for review and completion  An updated copy of the advance directives have been obtained  Goals are for rehabilitation to see if patient can improve from a functional perspective  Discussions related to outpatient palliative support were held --> patient's daughter is going to consider if she would like outpatient palliative support or not in the oncoming days  The palliative medicine team will continue to follow  Code Status/Advance Care Planning: DNAR/DNI   Symptom Management:  Delirium: - Maintain strict delirium precautions  Constipation: - Added senna at bedtime   Palliative Prophylaxis:  Aspiration, Bowel Regimen, Delirium Protocol, Frequent Pain Assessment, Oral Care, Palliative Wound Care, and Turn Reposition  Additional Recommendations (Limitations, Scope, Preferences): Continue current care  Psycho-social/Spiritual:  Desire for further Chaplaincy support: Not at this time Additional Recommendations: Education on acute on chronic disease burden   Prognosis: Patient with multiple chronic conditions, new hip repair, new stroke, general frailty, increased 74-month mortality risk.  Discharge Planning: Discharge plan will be to skilled nursing once medically optimized and placement is identified.  Vitals:   03/30/24 0500 03/30/24 0746  BP: (!) 147/59 (!) 140/68  Pulse: 73 75  Resp: 16 16  Temp: 98.8 F (37.1 C) 98.7 F (37.1 C)  SpO2: 100% 95%    Intake/Output Summary (Last 24 hours) at 03/30/2024 1326 Last data filed at 03/30/2024 1100 Gross per 24 hour  Intake --  Output 2300 ml  Net -2300 ml   Last Weight  Most recent update: 03/30/2024  6:18 AM    Weight  51.4 kg (113 lb 5.1 oz)            Gen: Elderly Caucasian female chronically ill in  appearance HEENT: Dry mucous membranes CV: Regular rate and rhythm PULM: On 3 L nasal cannula breathing is even and nonlabored ABD: soft/nontender EXT: Left lower extremity bandage at hip, no edema Neuro: Aware of self  PPS: 30%   This conversation/these recommendations were discussed with patient primary care team, Dr. Caleen ______________________________________________________ Rosaline Becton [facility] Palliative Medicine Team Team Cell Phone: [phone] Please utilize secure chat with additional questions, if there is no response within 30 minutes please call the above phone number  Total Time: 75 Billing based on MDM: High  Palliative Medicine Team providers are available by phone from 7am to 7pm daily and can be reached through the team cell phone.  Should this patient require assistance outside of these hours, please call the patient's attending physician.

## 2024-03-30 NOTE — TOC Initial Note (Signed)
 Transition of Care [facility]) - Initial/Assessment Note    Patient Details  Name: Linda [name] MRN: [id number] Date of Birth: 1923/12/17  Transition of Care [facility]) CM/SW Contact:    Linda [name] [place], [place] Phone Number: 03/30/2024, 11:33 AM  Clinical Narrative:                 CSW met with patient's daughter-Linda [name] at bedside(POA and HCPOA) to discuss SNF recommendation. Per patient's daughter, patient has been residing at [facility] assisted living for the past 2 years. Family is agreeable to SNF stay post discharge from the hospital. She has reviewed the Medicare.gov website and would like [facility], [facility] and [facility] to be considered. Fl2 completed and faxed out to the preferred facilities listed.    Transitions of Care to continue to follow  Linda [place], LCSW Transition of Care   Expected Discharge Plan: Skilled Nursing Facility Barriers to Discharge: Continued Medical Work up   Patient Goals and CMS Choice Patient states their goals for this hospitalization and ongoing recovery are:: improvement is basic mobility to get a clear picture of what her abilities are CMS Medicare.gov Compare Post Acute Care list provided to:: Patient Represenative (must comment) (Patient's daughtee-Linda)        Expected Discharge Plan and Services In-house Referral: Clinical Social Work   Post Acute Care Choice: Skilled Nursing Facility Living arrangements for the past 2 months: Assisted Living Facility ([facility])                                      Prior Living Arrangements/Services Living arrangements for the past 2 months: Assisted Living Facility ([facility]) Lives with:: Self Patient language and need for interpreter reviewed:: No Do you feel safe going back to the place where you live?: No   Minimal assistance if returned back home  Need for Family Participation in Patient Care: Yes (Comment) Care giver support system in place?: Yes  (comment) Current home services: DME (Rollator walker, hearing aid) Criminal Activity/Legal Involvement Pertinent to Current Situation/Hospitalization: No - Comment as needed  Activities of Daily Living   ADL Screening (condition at time of admission) Independently performs ADLs?: Yes (appropriate for developmental age) Is the patient deaf or have difficulty hearing?: Yes Does the patient have difficulty seeing, even when wearing glasses/contacts?: No Does the patient have difficulty concentrating, remembering, or making decisions?: No  Permission Sought/Granted                  Emotional Assessment       Orientation: : Oriented to Self Alcohol  / Substance Use: Not Applicable Psych Involvement: No (comment)  Admission diagnosis:  Closed left hip fracture (HCC) [S72.002A] Closed fracture of left hip, initial encounter (HCC) [S72.002A] Patient Active Problem List   Diagnosis Date Noted   Fall at home, initial encounter 03/26/2024   Acute hip pain, left 03/26/2024   History of COPD 03/26/2024   History of essential hypertension 03/26/2024   Closed pertrochanteric fracture of proximal end of left femur (HCC) 03/25/2024   Chronic knee instability, unspecified laterality 03/12/2024   Constipation 09/13/2023   Urinary hesitancy 01/05/2023   Insomnia 05/18/2022   Lumbar compression fracture (HCC) 01/25/2022   COPD (chronic obstructive pulmonary disease) (HCC) 09/21/2021   Osteoarthritis 08/12/2021   Dermatitis 04/13/2021   Multinodular goiter 11/19/2020   Hypercalcemia 11/19/2020   Hyperparathyroidism (HCC) 11/19/2020   Bilateral impacted cerumen 11/08/2020  Bronchiectasis without acute exacerbation (HCC) 07/14/2019   Chronic cough 02/03/2019   Thoracic back pain 10/13/2018   HLD (hyperlipidemia) 10/13/2018   Vitamin D  deficiency 10/13/2018   Gastroesophageal reflux disease without esophagitis 10/13/2018   Chronic midline thoracic back pain 10/13/2018   Osteoporosis  without current pathological fracture 10/13/2018   HTN (hypertension) 10/13/2018   Stenosis of carotid artery 10/13/2018   History of thyroid  nodule, s/p bx, benign 10/13/2018   Aortic cusp regurgitation 10/13/2018   Coccyalgia 10/13/2018   PAT (paroxysmal atrial tachycardia) (HCC) 10/13/2018   PCP:  Linda Worth HERO, MD Pharmacy:  No Pharmacies Listed    Social Drivers of Health (SDOH) Social History: SDOH Screenings   Food Insecurity: No Food Insecurity (03/25/2024)  Housing: Low Risk  (03/25/2024)  Transportation Needs: No Transportation Needs (03/25/2024)  Utilities: Not At Risk (03/25/2024)  Alcohol  Screen: Low Risk  (12/05/2023)  Depression (PHQ2-9): Low Risk  (12/05/2023)  Financial Resource Strain: Low Risk  (12/05/2023)  Physical Activity: Insufficiently Active (12/05/2023)  Social Connections: Socially Isolated (03/25/2024)  Stress: No Stress Concern Present (12/05/2023)  Tobacco Use: Medium Risk (03/26/2024)  Health Literacy: Adequate Health Literacy (12/05/2023)   SDOH Interventions:     Readmission Risk Interventions     No data to display

## 2024-03-30 NOTE — Plan of Care (Signed)

## 2024-03-31 DIAGNOSIS — Z7189 Other specified counseling: Secondary | ICD-10-CM | POA: Diagnosis not present

## 2024-03-31 DIAGNOSIS — Z515 Encounter for palliative care: Secondary | ICD-10-CM | POA: Diagnosis not present

## 2024-03-31 DIAGNOSIS — S72102G Unspecified trochanteric fracture of left femur, subsequent encounter for closed fracture with delayed healing: Secondary | ICD-10-CM | POA: Diagnosis not present

## 2024-03-31 LAB — BASIC METABOLIC PANEL WITH GFR
Anion gap: 7 (ref 5–15)
BUN: 15 mg/dL (ref 8–23)
CO2: 26 mmol/L (ref 22–32)
Calcium: 9.3 mg/dL (ref 8.9–10.3)
Chloride: 102 mmol/L (ref 98–111)
Creatinine, Ser: 0.52 mg/dL (ref 0.44–1.00)
GFR, Estimated: >60 mL/min (ref >60)
Glucose, Bld: 113 mg/dL — ABNORMAL HIGH (ref 70–99)
Potassium: 4.3 mmol/L (ref 3.5–5.1)
Sodium: 135 mmol/L (ref 135–145)

## 2024-03-31 LAB — CBC
HCT: 22.8 % — ABNORMAL LOW (ref 36.0–46.0)
Hemoglobin: 7.6 g/dL — ABNORMAL LOW (ref 12.0–15.0)
MCH: 31.5 pg (ref 26.0–34.0)
MCHC: 33.3 g/dL (ref 30.0–36.0)
MCV: 94.6 fL (ref 80.0–100.0)
Platelets: 213 K/uL (ref 150–400)
RBC: 2.41 MIL/uL — ABNORMAL LOW (ref 3.87–5.11)
RDW: 14 % (ref 11.5–15.5)
WBC: 7.3 K/uL (ref 4.0–10.5)
nRBC: 0 % (ref 0.0–0.2)

## 2024-03-31 LAB — MAGNESIUM: Magnesium: 2.1 mg/dL (ref 1.7–2.4)

## 2024-03-31 NOTE — Progress Notes (Signed)
 Orthopedic Surgery Progress Note   Assessment: Patient is a 89 y.o. female with left pertrochanteric femur fracture status post CMN   Plan: -Operative plans: complete -Diet: regular -Weight bearing status: as tolerated -PT evaluate and treat -Pain control -Dispo: per primary  ___________________________________________________________________________  Subjective: Patient sleeping this morning. Pain well controlled with 2.5mg  oxycodone . Patient has not been herself since this fall. She is not interactive and is not able to perform an ADLs. Her daughter is considering more of a palliative/hospice route since her mom would not like to live like this. She met with palliative last night and is considering options. Wants to see how PT goes before making any decisions.    Physical Exam:  General: no acute distress, appears stated age Neurologic: sleeping comfortably Respiratory: unlabored breathing on room air, symmetric chest rise  MSK:   -Left lower extremity  Small amount of serous drainage over proximal dressings, otherwise dressings c/d/i Foot warm and well perfused   Yesterday's total administered Morphine  Milligram Equivalents: 3.75   Patient name: Linda [name] Patient MRN: [id number] Date: 03/31/24

## 2024-03-31 NOTE — Progress Notes (Signed)
 PROGRESS NOTE    Linda [name]  FMW:[id number] DOB: 1924-03-07 DOA: 03/25/2024 PCP: Kennyth Worth HERO, MD    Brief Narrative:   89 y.o. female with medical history significant for COPD, essential hypertension, hyperlipidemia, who is admitted to [facility] on 03/25/2024 with acute left intertrochanteric femur fracture after ground-level mechanical fall at assisted living after presenting from home to [facility] ED complaining of fall. Status post ORIF 8/13.  Postop management, wound care, pain control, postop therapy recommendations per their service.  Postop had persistent encephalopathy therefore MRI brain was performed which showed concerns of acute CVA in the watershed area therefore neurology team was consulted.  It was eventually determined to continue her daily aspirin  and avoid any DAPT secondary to risk of anemia.   Assessment & Plan:  Principal Problem:   Closed left hip fracture (HCC) Active Problems:   HLD (hyperlipidemia)   Fall at home, initial encounter   Acute hip pain, left   History of COPD   History of essential hypertension   Mechanical fall with acute left-sided intertrochanteric femur fracture - Status post ORIF 8/13.  Postop management, wound care, pain control, postop therapy recommendations per their service.  -PT/OT-SNF  Acute blood loss anemia, postop vs dilutional.  - Hemoglobin baseline 10.8, trended down to 6.8 requiring 1 unit PRBC transfusion.  Hemoglobin now 8.1.  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  Had some vitamin B12 deficiency but MRI performed this morning shows acute patchy watershed CVA.  Seen by neurology team who has recommended continuing aspirin .  Due to recent anemia, avoiding DAPT.  Echo showing preserved EF with asymmetric hypertrophy and elevated PASP.  If patient's day goes well enough and require something at bedtime to sleep, resume her midodrine at 7.5 mg at  bedtime dose  -Concerns of occult CVA in the past.  She had completed 3 weeks of DAPT now on aspirin  alone.  Previously elected not to proceed with any kind of long-term monitoring  Vitamin B12 deficiency - Started supplements  Hypophosphatemia -replete prn  COPD - As needed bronchodilators  Essential hypertension - IV as needed  Hyperlipidemia -Currently on hold  Foley placed due to urinary retention.   Seen by palliative-outpatient follow-up   DVT prophylaxis: SCDs Start: 03/25/24 2017 Code = DNR/DNI Family Communication: Daughter present at bedside Status is: Inpatient SNF placement  Subjective: Watching TV.  Pleasantly confused but answer all the basic questions Daughter is present at bedside.  Patient ate a good meal yesterday breakfast and dinner.  Examination:  General exam: Appears calm and comfortable, elderly frail and cachectic, drowsy but easily arousable Respiratory system: Clear to auscultation. Respiratory effort normal. Cardiovascular system: S1 & S2 heard, RRR. No JVD, murmurs, rubs, gallops or clicks. No pedal edema. Gastrointestinal system: Abdomen is nondistended, soft and nontender. No organomegaly or masses felt. Normal bowel sounds heard. Central nervous system: Alert and oriented to name only. No focal neurological deficits. Extremities: Symmetric 4 x 5 power. Skin: Surgical dressing noted Psychiatry: Judgement and insight appear poor                Diet Orders (From admission, onward)     Start     Ordered   03/28/24 1536  DIET - DYS 1 Room service appropriate? No; Fluid consistency: Thin  Diet effective now       Question Answer Comment  Room service appropriate? No   Fluid consistency: Thin  03/28/24 1535            Objective: Vitals:   03/30/24 1643 03/30/24 2027 03/31/24 0458 03/31/24 0800  BP: (!) (P) 137/45 (!) 154/50 (!) 142/56 (!) 151/46  Pulse: (P) 87 85  73  Resp: (P) 16 16 16 16   Temp: (P) 99.6 F  (37.6 C) 99 F (37.2 C) 97.7 F (36.5 C) 98.2 F (36.8 C)  TempSrc: (P) Axillary Oral Oral Oral  SpO2:  97% 100% 98%  Weight:   57.5 kg   Height:        Intake/Output Summary (Last 24 hours) at 03/31/2024 1211 Last data filed at 03/31/2024 0900 Gross per 24 hour  Intake 240 ml  Output 850 ml  Net -610 ml   Filed Weights   03/29/24 0500 03/30/24 0500 03/31/24 0458  Weight: 59 kg 51.4 kg 57.5 kg    Scheduled Meds:  acetaminophen   1,000 mg Oral Q8H   artificial tears  1 drop Both Eyes QID   Chlorhexidine  Gluconate Cloth  6 each Topical Daily   vitamin B-12  1,000 mcg Oral Daily   famotidine   20 mg Oral BID   feeding supplement  237 mL Oral BID BM   feeding supplement  237 mL Oral BID BM   lidocaine   2 patch Transdermal Q24H   mouth rinse  15 mL Mouth Rinse 4 times per day   polyethylene glycol  17 g Oral Daily   rosuvastatin   20 mg Oral Daily   senna-docusate  1 tablet Oral QHS   sotalol   80 mg Oral Q12H   timolol   1 drop Both Eyes Daily   traMADol   50 mg Oral BID   Continuous Infusions:  tranexamic acid       Nutritional status     Body mass index is 21.76 kg/m.  Data Reviewed:   CBC: Recent Labs  Lab 03/25/24 1751 03/26/24 0413 03/27/24 0537 03/27/24 1702 03/28/24 9362 03/29/24 0957 03/30/24 0658 03/31/24 0508  WBC 8.2 11.2* 8.8  --  10.8* 9.3 7.8 7.3  NEUTROABS 5.7 8.7*  --   --   --   --   --   --   HGB 11.8* 10.8* 6.8* 7.8* 8.1* 8.0* 7.6* 7.6*  HCT 37.2 32.4* 20.0* 23.4* 23.5* 24.4* 23.4* 22.8*  MCV 99.2 94.5 95.2  --  91.1 93.5 94.7 94.6  PLT 217 216 124*  --  122* 177 201 213   Basic Metabolic Panel: Recent Labs  Lab 03/27/24 0537 03/28/24 0637 03/29/24 0738 03/30/24 0658 03/31/24 0508  NA 132* 130* 131* 134* 135  K 3.5 4.0 4.1 3.9 4.3  CL 102 99 101 101 102  CO2 24 26 23 27 26   GLUCOSE 157* 133* 165* 118* 113*  BUN 11 12 9 16 15   CREATININE 0.63 0.61 9.48 0.59 0.52  CALCIUM  9.0 9.1 9.3 9.2 9.3  MG 1.7 1.8 2.1 2.1 2.1  PHOS 2.2*   --   --   --   --    GFR: Estimated Creatinine Clearance: 33.9 mL/min (by C-G formula based on SCr of 0.52 mg/dL). Liver Function Tests: Recent Labs  Lab 03/25/24 1751 03/26/24 0413  AST 19 19  ALT 11 11  ALKPHOS 97 92  BILITOT 0.3 0.6  PROT 6.2* 5.9*  ALBUMIN  3.3* 3.1*   No results for input(s): LIPASE, AMYLASE in the last 168 hours. Recent Labs  Lab 03/28/24 0854  AMMONIA <13   Coagulation Profile: Recent Labs  Lab 03/26/24 0413  INR 1.0   Cardiac Enzymes: No results for input(s): CKTOTAL, CKMB, CKMBINDEX, TROPONINI in the last 168 hours. BNP (last 3 results) No results for input(s): PROBNP in the last 8760 hours. HbA1C: Recent Labs    03/29/24 0738  HGBA1C 5.3   CBG: No results for input(s): GLUCAP in the last 168 hours. Lipid Profile: Recent Labs    03/29/24 0738  CHOL 100  HDL 41  LDLCALC 42  TRIG 86  CHOLHDL 2.4   Thyroid  Function Tests: No results for input(s): TSH, T4TOTAL, FREET4, T3FREE, THYROIDAB in the last 72 hours. Anemia Panel: No results for input(s): VITAMINB12, FOLATE, FERRITIN, TIBC, IRON, RETICCTPCT in the last 72 hours. Sepsis Labs: No results for input(s): PROCALCITON, LATICACIDVEN in the last 168 hours.  Recent Results (from the past 240 hours)  Surgical PCR screen     Status: None   Collection Time: 03/26/24  3:52 AM   Specimen: Nasal Mucosa; Nasal Swab  Result Value Ref Range Status   MRSA, PCR NEGATIVE NEGATIVE Final   Staphylococcus aureus NEGATIVE NEGATIVE Final    Comment: (NOTE) The Xpert SA Assay (FDA approved for NASAL specimens in patients 65 years of age and older), is one component of a comprehensive surveillance program. It is not intended to diagnose infection nor to guide or monitor treatment. Performed at [facility], [address] [place], [place] [address]          Radiology Studies: ECHOCARDIOGRAM COMPLETE Result Date: 03/30/2024     ECHOCARDIOGRAM REPORT   Patient Name:   Linda [name] Date of Exam: 03/29/2024 Medical Rec #:  [id number]     Height:       64.0 in Accession #:    [id number]    Weight:       113.3 lb Date of Birth:  1923-08-30     BSA:          1.537 m Patient Age:    89 years      BP:           140/68 mmHg Patient Gender: F             HR:           86 bpm. Exam Location:  Inpatient Procedure: 2D Echo, Cardiac Doppler and Color Doppler (Both Spectral and Color            Flow Doppler were utilized during procedure). Indications:    Stroke I63.9  History:        Patient has prior history of Echocardiogram examinations, most                 recent 09/20/2021. COPD, Arrythmias:Tachycardia; Risk                 Factors:Hypertension and Dyslipidemia.  Sonographer:    Thea Norlander RCS Referring Phys: [id number] DEVON SHAFER IMPRESSIONS  1. Moderate asymmetric hypertrophy. There is chordal SAM with intracavitary gradient up to 23 mmHG, due to hyperdynamic function and basal septal hypertrophy. Left ventricular ejection fraction, by estimation, is 70 to 75%. The left ventricle has hyperdynamic function. The left ventricle has no regional wall motion abnormalities. There is moderate asymmetric left ventricular hypertrophy of the basal-septal segment. Left ventricular diastolic parameters are consistent with Grade I diastolic dysfunction (impaired relaxation).  2. Right ventricular systolic function is normal. The right ventricular size is normal. There is moderately elevated pulmonary artery systolic pressure. The estimated right ventricular systolic pressure is 57.8 mmHg.  3. The  mitral valve is degenerative. Mild mitral valve regurgitation. No evidence of mitral stenosis.  4. The tricuspid valve is abnormal. Tricuspid valve regurgitation is moderate.  5. The aortic valve is tricuspid. Aortic valve regurgitation is mild. Mild aortic valve stenosis. Aortic valve area, by VTI measures 1.60 cm. Aortic valve mean gradient measures 7.5 mmHg.  Aortic valve Vmax measures 1.91 m/s.  6. The inferior vena cava is normal in size with <50% respiratory variability, suggesting right atrial pressure of 8 mmHg. Comparison(s): No significant change from prior study. FINDINGS  Left Ventricle: Moderate asymmetric hypertrophy. There is chordal SAM with intracavitary gradient up to 23 mmHG, due to hyperdynamic function and basal septal hypertrophy. Left ventricular ejection fraction, by estimation, is 70 to 75%. The left ventricle has hyperdynamic function. The left ventricle has no regional wall motion abnormalities. The left ventricular internal cavity size was normal in size. There is moderate asymmetric left ventricular hypertrophy of the basal-septal segment. Left ventricular diastolic parameters are consistent with Grade I diastolic dysfunction (impaired relaxation). Right Ventricle: The right ventricular size is normal. No increase in right ventricular wall thickness. Right ventricular systolic function is normal. There is moderately elevated pulmonary artery systolic pressure. The tricuspid regurgitant velocity is 3.53 m/s, and with an assumed right atrial pressure of 8 mmHg, the estimated right ventricular systolic pressure is 57.8 mmHg. Left Atrium: Left atrial size was normal in size. Right Atrium: Right atrial size was normal in size. Pericardium: There is no evidence of pericardial effusion. Mitral Valve: The mitral valve is degenerative in appearance. Mild to moderate mitral annular calcification. Mild mitral valve regurgitation. No evidence of mitral valve stenosis. Tricuspid Valve: The tricuspid valve is abnormal. Tricuspid valve regurgitation is moderate . No evidence of tricuspid stenosis. Aortic Valve: The aortic valve is tricuspid. Aortic valve regurgitation is mild. Mild aortic stenosis is present. Aortic valve mean gradient measures 7.5 mmHg. Aortic valve peak gradient measures 14.6 mmHg. Aortic valve area, by VTI measures 1.60 cm. Pulmonic Valve:  The pulmonic valve was grossly normal. Pulmonic valve regurgitation is mild. No evidence of pulmonic stenosis. Aorta: The aortic root and ascending aorta are structurally normal, with no evidence of dilitation. Venous: The inferior vena cava is normal in size with less than 50% respiratory variability, suggesting right atrial pressure of 8 mmHg. IAS/Shunts: The atrial septum is grossly normal.  LEFT VENTRICLE PLAX 2D LVIDd:         2.90 cm   Diastology LVIDs:         1.20 cm   LV e' medial:    6.53 cm/s LV PW:         0.90 cm   LV E/e' medial:  12.8 LV IVS:        1.40 cm   LV e' lateral:   8.92 cm/s LVOT diam:     1.70 cm   LV E/e' lateral: 9.4 LV SV:         59 LV SV Index:   38 LVOT Area:     2.27 cm  RIGHT VENTRICLE             IVC RV S prime:     14.70 cm/s  IVC diam: 2.00 cm TAPSE (M-mode): 2.2 cm LEFT ATRIUM             Index        RIGHT ATRIUM          Index LA diam:        3.30 cm 2.15  cm/m   RA Area:     9.70 cm LA Vol (A2C):   45.7 ml 29.74 ml/m  RA Volume:   17.40 ml 11.32 ml/m LA Vol (A4C):   26.9 ml 17.51 ml/m LA Biplane Vol: 35.1 ml 22.84 ml/m  AORTIC VALVE AV Area (Vmax):    1.64 cm AV Area (Vmean):   1.67 cm AV Area (VTI):     1.60 cm AV Vmax:           191.00 cm/s AV Vmean:          125.500 cm/s AV VTI:            0.368 m AV Peak Grad:      14.6 mmHg AV Mean Grad:      7.5 mmHg LVOT Vmax:         138.00 cm/s LVOT Vmean:        92.400 cm/s LVOT VTI:          0.259 m LVOT/AV VTI ratio: 0.70  AORTA Ao Root diam: 2.80 cm Ao Asc diam:  3.30 cm MITRAL VALVE                TRICUSPID VALVE MV Area (PHT): 2.73 cm     TR Peak grad:   49.8 mmHg MV Decel Time: 278 msec     TR Vmax:        353.00 cm/s MV E velocity: 83.80 cm/s MV A velocity: 117.00 cm/s  SHUNTS MV E/A ratio:  0.72         Systemic VTI:  0.26 m                             Systemic Diam: 1.70 cm Darryle Decent MD Electronically signed by Darryle Decent MD Signature Date/Time: 03/30/2024/2:21:16 PM    Final            LOS: 6 days    Time spent= 35 mins    Burgess JAYSON Dare, MD [facility]  If 7PM-7AM, please contact night-coverage  03/31/2024, 12:11 PM

## 2024-03-31 NOTE — Progress Notes (Signed)
   Palliative Medicine Inpatient Follow Up Note HPI: 89 y.o. female with medical history significant for COPD, essential hypertension, hyperlipidemia, L hip fracture, fall(s), previous stroke. The PMT has been asked to support additional goals of care in the setting of an acute CVA.   Today's Discussion 03/31/2024  *Please note that this is a verbal dictation therefore any spelling or grammatical errors are due to the Dragon Medical One system interpretation.  Chart reviewed inclusive of vital signs, progress notes, laboratory results, and diagnostic images.   I met this morning with Eleftheria and her daughter, Channing. We discussed that Damira had a better night sleep last night.   We reviewed the plan to work with PT/OT today. Patients daughter plans to call [facility] to see if the level of care she receives could be increased. If it can then she would elect for Renay to go back there. It had been explained to her that the medical team does not feel she will have robust outcomes at SNF.   Created space and opportunity for Channing to explore thoughts feelings and fears regarding Katora' current medical situation.  She shares she has considered hospice but would first like to see how Altheria is able to do with PT/OT. She is aware that this is a service which could be provided at [facility] as well. Cheryl worries that Elysse as she is now has little quality of life and this is not how she would want to live.   Questions and concerns addressed/Palliative Support Provided.   Objective Assessment: Vital Signs Vitals:   03/31/24 0458 03/31/24 0800  BP: (!) 142/56 (!) 151/46  Pulse:  73  Resp: 16 16  Temp: 97.7 F (36.5 C) 98.2 F (36.8 C)  SpO2: 100% 98%    Intake/Output Summary (Last 24 hours) at 03/31/2024 1244 Last data filed at 03/31/2024 0900 Gross per 24 hour  Intake 240 ml  Output 850 ml  Net -610 ml   Last Weight  Most recent update: 03/31/2024  5:03 AM    Weight   57.5 kg (126 lb 12.2 oz)            Gen: Elderly Caucasian female chronically ill in appearance HEENT: Dry mucous membranes CV: Regular rate and rhythm PULM: On 3 L nasal cannula breathing is even and nonlabored ABD: soft/nontender EXT: Left lower extremity bandage at hip, no edema Neuro: Aware of self  SUMMARY OF RECOMMENDATIONS   DNAR/DNI   A gold DNR form is completed and in the front of the chart   PT/OT pending --> decisions moving forward will be to SNF, [facility] with PT/OT, or [facility] with Hospice   The palliative medicine team will continue to follow ______________________________________________________________________________________ Rosaline Becton [facility] Palliative Medicine Team Team Cell Phone: [phone] Please utilize secure chat with additional questions, if there is no response within 30 minutes please call the above phone number  Billing based on MDM: Moderate   Palliative Medicine Team providers are available by phone from 7am to 7pm daily and can be reached through the team cell phone.  Should this patient require assistance outside of these hours, please call the patient's attending physician.

## 2024-03-31 NOTE — Progress Notes (Signed)
 Physical Therapy Treatment Patient Details Name: Linda [name] MRN: [id number] DOB: [birth year] Today's Date: 03/31/2024   History of Present Illness Linda [name] is a 89 y.o. female admitted 03/25/24 after ground level mechanical fall at ALF resulting in acute left pertrochanteric femur fracture s/p intramedullary rodding 8/13. Pt with decreased level of alertness prompting MRI 8/15 that revealed patchy acute infarcts in the anterior L frontal lobe matter, ACA/MCA watershed area. 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 PMHx: COPD, essential HTN, CVA, thyroid  disease, and HLD.    PT Comments  Pt resting in bed on arrival, pleasantly confused and demonstrating slow but steady progress towards acute goals. Pt progressing sitting balance this session, able to maintain with min A fading to CGA. Pt with initial R lateral onto elbow, able to correct with multimodal cues. Pt continues to maintain cervical flexion and rotation to R throughout session, able to bring head to neutral in sitting with hands on assist but pt unable to maintain. Pt agreeable to standing attempts, able to clear bottom x2 with max-total A with RW for support, however unable to achieve full standing. Pt positioned in chair position at end of session with head in neutral. Daughter present and supportive throughout session. Pt continues to benefit from skilled PT services to progress toward functional mobility goals.     If plan is discharge home, recommend the following: Two people to help with walking and/or transfers;Two people to help with bathing/dressing/bathroom;Supervision due to cognitive status;Direct supervision/assist for medications management;Direct supervision/assist for financial management;Assistance with feeding;Assistance with cooking/housework;Assist for transportation;Help with stairs or ramp for entrance   Can travel by private vehicle     No  Equipment Recommendations  Hospital bed;Hoyer lift;Wheelchair  (measurements PT)    Recommendations for Other Services       Precautions / Restrictions Precautions Precautions: Fall Recall of Precautions/Restrictions: Impaired Restrictions Weight Bearing Restrictions Per Provider Order: Yes LLE Weight Bearing Per Provider Order: Weight bearing as tolerated     Mobility  Bed Mobility Overal bed mobility: Needs Assistance Bed Mobility: Supine to Sit, Sit to Supine     Supine to sit: HOB elevated, Max assist Sit to supine: Total assist   General bed mobility comments: pt able to initiate LEs to EOB and reach for rail on L with RUE, assist to pivot hips to EOB, and to eleavte trunk, total A to return to supine    Transfers Overall transfer level: Needs assistance Equipment used: Rolling walker (2 wheels) Transfers: Sit to/from Stand Sit to Stand: Total assist, From elevated surface           General transfer comment: attempting x2 to stand from slightly eleavted EOB, total A to elevate hips a few inches, unable to come into standing    Ambulation/Gait                   Stairs             Wheelchair Mobility     Tilt Bed    Modified Rankin (Stroke Patients Only) Modified Rankin (Stroke Patients Only) Pre-Morbid Rankin Score: No significant disability Modified Rankin: Moderately severe disability     Balance Overall balance assessment: Needs assistance Sitting-balance support: Bilateral upper extremity supported, Feet supported Sitting balance-Leahy Scale: Fair Sitting balance - Comments: pt initially with R lateral lean in sitting, able to come to midline with multimodal cueing, able to maintain sitting balance with min A fading to CGA, Pt showed limited cervical activation resting in flexion  and right lateral flexion. Postural control: Posterior lean, Right lateral lean                                  Communication Communication Communication: Impaired Factors Affecting Communication:  Difficulty expressing self;Reduced clarity of speech  Cognition Arousal: Alert Behavior During Therapy: WFL for tasks assessed/performed   PT - Cognitive impairments: Awareness, Memory, Attention, Initiation, Sequencing, Problem solving                       PT - Cognition Comments: pt needing increased time to follow commands and initiate all mobility Following commands: Impaired Following commands impaired: Follows one step commands inconsistently, Follows one step commands with increased time    Cueing Cueing Techniques: Verbal cues, Gestural cues, Tactile cues, Visual cues  Exercises      General Comments General comments (skin integrity, edema, etc.): Positioned pillows to support pt to maintain neutral alignment in bed.      Pertinent Vitals/Pain Pain Assessment Pain Assessment: Faces Faces Pain Scale: Hurts little more Pain Location: LLE with movement Pain Descriptors / Indicators: Discomfort, Grimacing, Guarding Pain Intervention(s): Premedicated before session, Monitored during session, Limited activity within patient's tolerance    Home Living                          Prior Function            PT Goals (current goals can now be found in the care plan section) Acute Rehab PT Goals Patient Stated Goal: Daughter reports for pt to regain her mobility PT Goal Formulation: Patient unable to participate in goal setting Time For Goal Achievement: 04/12/24 Progress towards PT goals: Progressing toward goals    Frequency    Min 2X/week      PT Plan      Co-evaluation              AM-PAC PT 6 Clicks Mobility   Outcome Measure  Help needed turning from your back to your side while in a flat bed without using bedrails?: Total Help needed moving from lying on your back to sitting on the side of a flat bed without using bedrails?: Total Help needed moving to and from a bed to a chair (including a wheelchair)?: Total Help needed standing  up from a chair using your arms (e.g., wheelchair or bedside chair)?: Total Help needed to walk in hospital room?: Total Help needed climbing 3-5 steps with a railing? : Total 6 Click Score: 6    End of Session   Activity Tolerance: Patient limited by lethargy;Patient limited by pain Patient left: in bed;with call bell/phone within reach;with bed alarm set;with family/visitor present Nurse Communication: Mobility status PT Visit Diagnosis: Difficulty in walking, not elsewhere classified (R26.2);Other abnormalities of gait and mobility (R26.89);Hemiplegia and hemiparesis;Unsteadiness on feet (R26.81) Hemiplegia - Right/Left: Right Hemiplegia - dominant/non-dominant: Dominant Hemiplegia - caused by: Cerebral infarction     Time: 8890-8860 PT Time Calculation (min) (ACUTE ONLY): 30 min  Charges:    $Therapeutic Activity: 23-37 mins PT General Charges $$ ACUTE PT VISIT: 1 Visit                     Cledith Abdou R. PTA Acute Rehabilitation Services Office: [phone]   Therisa CHRISTELLA Boor 03/31/2024, 12:23 PM

## 2024-03-31 NOTE — Plan of Care (Signed)
  Problem: Clinical Measurements: Goal: Will remain free from infection Outcome: Progressing   Problem: Coping: Goal: Level of anxiety will decrease Outcome: Progressing   Problem: Elimination: Goal: Will not experience complications related to urinary retention Outcome: Progressing   Problem: Pain Managment: Goal: General experience of comfort will improve and/or be controlled Outcome: Progressing   Problem: Safety: Goal: Ability to remain free from injury will improve Outcome: Progressing   Problem: Skin Integrity: Goal: Risk for impaired skin integrity will decrease Outcome: Progressing   Problem: Education: Goal: Knowledge of General Education information will improve Description: Including pain rating scale, medication(s)/side effects and non-pharmacologic comfort measures Outcome: Not Progressing   Problem: Health Behavior/Discharge Planning: Goal: Ability to manage health-related needs will improve Outcome: Not Progressing   Problem: Activity: Goal: Risk for activity intolerance will decrease Outcome: Not Progressing   Problem: Nutrition: Goal: Adequate nutrition will be maintained Outcome: Not Progressing

## 2024-03-31 NOTE — Plan of Care (Signed)

## 2024-03-31 NOTE — TOC Progression Note (Addendum)
 Transition of Care [facility]) - Progression Note    Patient Details  Name: Linda [name] MRN: [id number] Date of Birth: 1923/08/25  [facility] CM/SW Contact  Bridget Cordella Simmonds, LCSW Phone Number: 03/31/2024, 2:15 PM  Clinical Narrative:   Pt oriented x1.  Bed offers provided to pt daughter Channing and she would like to accept offer at [facility].  CSW waiting on confirmation from [facility].   Medicare payer with inpt auth 03/25/24.   1430: Message from [facility]: they can receive pt tomorrow.     Expected Discharge Plan: Skilled Nursing Facility Barriers to Discharge: Continued Medical Work up               Expected Discharge Plan and Services In-house Referral: Clinical Social Work   Post Acute Care Choice: Skilled Nursing Facility Living arrangements for the past 2 months: Assisted Living Facility ([facility])                                       Social Drivers of Health (SDOH) Interventions SDOH Screenings   Food Insecurity: No Food Insecurity (03/25/2024)  Housing: Low Risk  (03/25/2024)  Transportation Needs: No Transportation Needs (03/25/2024)  Utilities: Not At Risk (03/25/2024)  Alcohol  Screen: Low Risk  (12/05/2023)  Depression (PHQ2-9): Low Risk  (12/05/2023)  Financial Resource Strain: Low Risk  (12/05/2023)  Physical Activity: Insufficiently Active (12/05/2023)  Social Connections: Socially Isolated (03/25/2024)  Stress: No Stress Concern Present (12/05/2023)  Tobacco Use: Medium Risk (03/26/2024)  Health Literacy: Adequate Health Literacy (12/05/2023)    Readmission Risk Interventions     No data to display

## 2024-04-01 DIAGNOSIS — Z7189 Other specified counseling: Secondary | ICD-10-CM | POA: Diagnosis not present

## 2024-04-01 DIAGNOSIS — S72102G Unspecified trochanteric fracture of left femur, subsequent encounter for closed fracture with delayed healing: Secondary | ICD-10-CM | POA: Diagnosis not present

## 2024-04-01 DIAGNOSIS — Z515 Encounter for palliative care: Secondary | ICD-10-CM | POA: Diagnosis not present

## 2024-04-01 LAB — CBC
HCT: 25.2 % — ABNORMAL LOW (ref 36.0–46.0)
Hemoglobin: 8.1 g/dL — ABNORMAL LOW (ref 12.0–15.0)
MCH: 30.8 pg (ref 26.0–34.0)
MCHC: 32.1 g/dL (ref 30.0–36.0)
MCV: 95.8 fL (ref 80.0–100.0)
Platelets: 259 K/uL (ref 150–400)
RBC: 2.63 MIL/uL — ABNORMAL LOW (ref 3.87–5.11)
RDW: 14.2 % (ref 11.5–15.5)
WBC: 10.2 K/uL (ref 4.0–10.5)
nRBC: 0 % (ref 0.0–0.2)

## 2024-04-01 LAB — BASIC METABOLIC PANEL WITH GFR
Anion gap: 7 (ref 5–15)
BUN: 23 mg/dL (ref 8–23)
CO2: 28 mmol/L (ref 22–32)
Calcium: 9.5 mg/dL (ref 8.9–10.3)
Chloride: 99 mmol/L (ref 98–111)
Creatinine, Ser: 0.56 mg/dL (ref 0.44–1.00)
GFR, Estimated: >60 mL/min (ref >60)
Glucose, Bld: 137 mg/dL — ABNORMAL HIGH (ref 70–99)
Potassium: 4.1 mmol/L (ref 3.5–5.1)
Sodium: 134 mmol/L — ABNORMAL LOW (ref 135–145)

## 2024-04-01 LAB — MAGNESIUM: Magnesium: 2.1 mg/dL (ref 1.7–2.4)

## 2024-04-01 MED ORDER — BISACODYL 5 MG PO TBEC
10.0000 mg | DELAYED_RELEASE_TABLET | Freq: Every day | ORAL | Status: DC
  Administered 2024-04-01: 10 mg via ORAL
  Filled 2024-04-01: qty 2

## 2024-04-01 MED ORDER — ASPIRIN 81 MG PO TBEC: 81.0000 mg | DELAYED_RELEASE_TABLET | Freq: Two times a day (BID) | ORAL | 0 refills | Status: AC

## 2024-04-01 MED ORDER — ENSURE PLUS HIGH PROTEIN PO LIQD: 237.0000 mL | Freq: Two times a day (BID) | ORAL | Status: AC

## 2024-04-01 MED ORDER — OXYCODONE HCL 5 MG PO TABS: 2.5000 mg | ORAL_TABLET | ORAL | 0 refills | Status: AC | PRN

## 2024-04-01 MED ORDER — ACETAMINOPHEN 500 MG PO TABS: 1000.0000 mg | ORAL_TABLET | Freq: Three times a day (TID) | ORAL | 0 refills | Status: AC

## 2024-04-01 MED ORDER — CYANOCOBALAMIN 1000 MCG PO TABS: 1000.0000 ug | ORAL_TABLET | Freq: Every day | ORAL | Status: AC

## 2024-04-01 NOTE — Progress Notes (Signed)
 Occupational Therapy Treatment Patient Details Name: Ixel Boehning MRN: [id number] DOB: August 24, 1923 Today's Date: 04/01/2024   History of present illness Linda [name] is a 89 y.o. female admitted 03/25/24 after ground level mechanical fall at ALF resulting in acute left pertrochanteric femur fracture s/p intramedullary rodding 8/13. Pt with decreased level of alertness prompting MRI 8/15 that revealed patchy acute infarcts in the anterior L frontal lobe matter, ACA/MCA watershed area. 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 PMHx: COPD, essential HTN, CVA, thyroid  disease, and HLD.   OT comments  Patient able to transition to recliner this date.  Deficits impacting independence are listed below.  Plan remains short term rehab trial at a local SNF.  OT will continue efforts in the acute setting.         If plan is discharge home, recommend the following:  Two people to help with walking and/or transfers;Two people to help with bathing/dressing/bathroom;Assistance with cooking/housework;Assistance with feeding;Direct supervision/assist for medications management;Direct supervision/assist for financial management;Assist for transportation;Help with stairs or ramp for entrance   Equipment Recommendations  Wheelchair cushion (measurements OT);Wheelchair (measurements OT)    Recommendations for Other Services      Precautions / Restrictions Precautions Precautions: Fall Recall of Precautions/Restrictions: Impaired Restrictions Weight Bearing Restrictions Per Provider Order: Yes LLE Weight Bearing Per Provider Order: Weight bearing as tolerated       Mobility Bed Mobility Overal bed mobility: Needs Assistance Bed Mobility: Supine to Sit     Supine to sit: HOB elevated, Max assist          Transfers Overall transfer level: Needs assistance   Transfers: Bed to chair/wheelchair/BSC     Squat pivot transfers: Max assist, +2 safety/equipment             Balance Overall  balance assessment: Needs assistance Sitting-balance support: Feet supported Sitting balance-Leahy Scale: Poor   Postural control: Posterior lean, Right lateral lean Standing balance support: Bilateral upper extremity supported Standing balance-Leahy Scale: Zero                             ADL either performed or assessed with clinical judgement   ADL                       Lower Body Dressing: Total assistance;+2 for physical assistance;+2 for safety/equipment;Bed level                      Extremity/Trunk Assessment Upper Extremity Assessment Upper Extremity Assessment: Generalized weakness   Lower Extremity Assessment Lower Extremity Assessment: Defer to PT evaluation        Vision Baseline Vision/History: 3 Glaucoma Patient Visual Report: No change from baseline     Perception Perception Perception: Not tested   Praxis Praxis Praxis: Not tested   Communication Communication Communication: Impaired Factors Affecting Communication: Difficulty expressing self   Cognition Arousal: Alert Behavior During Therapy: WFL for tasks assessed/performed Cognition: History of cognitive impairments                               Following commands: Impaired Following commands impaired: Follows one step commands inconsistently, Follows one step commands with increased time      Cueing   Cueing Techniques: Gestural cues, Verbal cues, Tactile cues  Exercises      Shoulder Instructions       General Comments  Pertinent Vitals/ Pain       Pain Assessment Faces Pain Scale: Hurts even more Pain Location: LLE with movement Pain Descriptors / Indicators: Discomfort, Grimacing, Guarding Pain Intervention(s): Monitored during session, Premedicated before session                                                          Frequency  Min 2X/week        Progress Toward Goals  OT Goals(current goals  can now be found in the care plan section)  Progress towards OT goals: Progressing toward goals  Acute Rehab OT Goals OT Goal Formulation: With patient Time For Goal Achievement: 04/12/24 Potential to Achieve Goals: Fair  Plan      Co-evaluation                 AM-PAC OT 6 Clicks Daily Activity     Outcome Measure   Help from another person eating meals?: A Lot Help from another person taking care of personal grooming?: A Lot Help from another person toileting, which includes using toliet, bedpan, or urinal?: Total Help from another person bathing (including washing, rinsing, drying)?: Total Help from another person to put on and taking off regular upper body clothing?: A Lot Help from another person to put on and taking off regular lower body clothing?: Total 6 Click Score: 9    End of Session Equipment Utilized During Treatment: Gait belt  OT Visit Diagnosis: Unsteadiness on feet (R26.81);Other abnormalities of gait and mobility (R26.89);Muscle weakness (generalized) (M62.81);Ataxia, unspecified (R27.0);Apraxia (R48.2);Other symptoms and signs involving cognitive function;Cognitive communication deficit (R41.841);Pain Symptoms and signs involving cognitive functions: Cerebral infarction Pain - Right/Left: Left Pain - part of body: Leg   Activity Tolerance Patient limited by fatigue   Patient Left in chair;with call bell/phone within reach;with family/visitor present   Nurse Communication Mobility status        Time: 8842-8781 OT Time Calculation (min): 21 min  Charges: OT General Charges $OT Visit: 1 Visit OT Treatments $Therapeutic Activity: 8-22 mins  04/01/2024  RP, OTR/L  Acute Rehabilitation Services  Office:  [phone]   Charlie JONETTA Halsted 04/01/2024, 12:43 PM

## 2024-04-01 NOTE — Discharge Summary (Addendum)
 Physician Discharge Summary  Linda [name] FMW:[id number] DOB: April 02, 1924 DOA: 03/25/2024  PCP: Kennyth Worth HERO, MD  Admit date: 03/25/2024 Discharge date: 04/01/2024  Admitted From: ALF Disposition: SNF  Recommendations for Outpatient Follow-up:  Follow up with PCP in 1-2 weeks Please obtain BMP/CBC in one week your next doctors visit.  Scheduled Tylenol  and oxycodone  2.5 mg as needed.  Bowel regimen as needed If necessary of tramadol  Outpatient follow-up with orthopedic Recommend outpatient follow-up with palliative care services while Start mirtazapine  at 7.5 mg at bedtime if necessary Voiding trail for foley removal on 8/21   Discharge Condition: Stable CODE STATUS: DNR Diet recommendation: Heart healthy  Brief/Interim Summary: Brief Narrative:   89 y.o. female with medical history significant for COPD, essential hypertension, hyperlipidemia, who is admitted to [facility] on 03/25/2024 with acute left intertrochanteric femur fracture after ground-level mechanical fall at assisted living after presenting from home to [facility] ED complaining of fall. Status post ORIF 8/13.  Postop management, wound care, pain control, postop therapy recommendations per their service.  Postop had persistent encephalopathy therefore MRI brain was performed which showed concerns of acute CVA in the watershed area therefore neurology team was consulted.  It was eventually determined to continue her daily aspirin  and avoid any DAPT secondary to risk of anemia. Plans for SNF placement.  Assessment & Plan:  Principal Problem:   Closed left hip fracture (HCC) Active Problems:   HLD (hyperlipidemia)   Fall at home, initial encounter   Acute hip pain, left   History of COPD   History of essential hypertension   Mechanical fall with acute left-sided intertrochanteric femur fracture - Status post ORIF 8/13.  Postop management, wound care, pain control, postop therapy recommendations per their service.   -PT/OT-SNF  Acute blood loss anemia, postop vs dilutional.  - Hemoglobin baseline 10.8, trended down to 6.8 requiring 1 unit PRBC transfusion.  Hemoglobin now 8.1.  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  Had some vitamin B12 deficiency but MRI performed this morning shows acute patchy watershed CVA.  Seen by neurology team who has recommended continuing aspirin .  Due to recent anemia, avoiding DAPT.  Echo showing preserved EF with asymmetric hypertrophy and elevated PASP.  If patient's day goes well enough and require something at bedtime to sleep, resume her midodrine at 7.5 mg at bedtime dose  -Concerns of occult CVA in the past.  She had completed 3 weeks of DAPT now on aspirin  alone.  Previously elected not to proceed with any kind of long-term monitoring  Vitamin B12 deficiency - Started supplements  Hypophosphatemia -replete prn  COPD - As needed bronchodilators  Essential hypertension - IV as needed  Hyperlipidemia -Currently on hold  Foley placed due to urinary retention.   Seen by palliative-outpatient follow-up   DVT prophylaxis: SCDs Start: 03/25/24 2017 Code = DNR/DNI Family Communication: Daughter present at bedside Status is: Inpatient SNF placement  Subjective: Watching TV no complaints.  Examination:  General exam: Appears calm and comfortable, elderly frail and cachectic, drowsy but easily arousable Respiratory system: Clear to auscultation. Respiratory effort normal. Cardiovascular system: S1 & S2 heard, RRR. No JVD, murmurs, rubs, gallops or clicks. No pedal edema. Gastrointestinal system: Abdomen is nondistended, soft and nontender. No organomegaly or masses felt. Normal bowel sounds heard. Central nervous system: Alert and oriented to name only. No focal neurological deficits. Extremities: Symmetric 4 x 5 power. Skin: Surgical dressing noted Psychiatry: Judgement and insight  appear poor    Discharge Diagnoses:  Principal Problem:   Closed pertrochanteric fracture of proximal end of left femur (HCC) Active Problems:   HLD (hyperlipidemia)   Fall at home, initial encounter   Acute hip pain, left   History of COPD   History of essential hypertension      Discharge Exam: Vitals:   04/01/24 0541 04/01/24 0737  BP: (!) 143/50 (!) 150/50  Pulse: 67 68  Resp: 16 18  Temp: 98.2 F (36.8 C) 98.3 F (36.8 C)  SpO2: 94% 94%   Vitals:   03/31/24 1602 03/31/24 1955 04/01/24 0541 04/01/24 0737  BP: (!) 129/53 (!) 124/42 (!) 143/50 (!) 150/50  Pulse: 82 78 67 68  Resp: 16 16 16 18   Temp: 98.4 F (36.9 C) 99.4 F (37.4 C) 98.2 F (36.8 C) 98.3 F (36.8 C)  TempSrc: Oral Oral Oral Oral  SpO2: (!) 87% 91% 94% 94%  Weight:      Height:          Discharge Instructions  Discharge Instructions     Ambulatory referral to Neurology   Complete by: As directed    Follow up with Dr. Skeet at [facility] in 4-6 weeks. Pt is Dr. Jayme pt. Thanks.      Allergies as of 04/01/2024       Reactions   Cymbalta  [duloxetine  Hcl] Other (See Comments)   Unknown reaction        Medication List     PAUSE taking these medications    mirtazapine  30 MG tablet Wait to take this until your doctor or other care provider tells you to start again. Commonly known as: REMERON  Take 30 mg by mouth at bedtime.       STOP taking these medications    traMADol  50 MG tablet Commonly known as: ULTRAM        TAKE these medications    acetaminophen  500 MG tablet Commonly known as: TYLENOL  Take 2 tablets (1,000 mg total) by mouth every 8 (eight) hours for 14 days.   amLODipine  2.5 MG tablet Commonly known as: NORVASC  TAKE (1) TABLET BY MOUTH TWICE DAILY.   Anusol -HC 2.5 % rectal cream Generic drug: hydrocortisone  Place 1 Application rectally every 12 (twelve) hours as needed (irritation).   aspirin  EC 81 MG tablet Take 1 tablet (81 mg total) by mouth 2  (two) times daily. Swallow whole. What changed: See the new instructions.   bisacodyl  10 MG suppository Commonly known as: DULCOLAX UNWRAP AND INSERT 1 (10MG ) SUPPOSITORY PER RECTUM EVERY 24 HOURS AS NEEDED FOR CONSTIPATION.   clobetasol  cream 0.05 % Commonly known as: TEMOVATE  APPLY TOPICALLY TO AFFECTED AREA(S) EVERY 12 HOURS AS NEEDED FOR DERMATITIS. What changed: Another medication with the same name was removed. Continue taking this medication, and follow the directions you see here.   cyanocobalamin  1000 MCG tablet Take 1 tablet (1,000 mcg total) by mouth daily.   dextromethorphan -guaiFENesin  30-600 MG 12hr tablet Commonly known as: MUCINEX  DM Take 1 tablet by mouth every 12 (twelve) hours as needed for cough (congestion).   famotidine  20 MG tablet Commonly known as: PEPCID  Take 1 tablet (20 mg total) by mouth 2 (two) times daily. Patient needs follow up appointment for future refills. Please call [phone] to schedule an appointment.   feeding supplement Liqd Take 237 mLs by mouth 2 (two) times daily between meals.   lidocaine  5 % Commonly known as: LIDODERM  APPLY (1) PATCH TO LOWER BACK ONCE DAILY AT BEDTIME. LEAVE ON 12  HOURS. LEAVE OFF 12 HOURS.   oxyCODONE  5 MG immediate release tablet Commonly known as: Oxy IR/ROXICODONE  Take 0.5 tablets (2.5 mg total) by mouth every 4 (four) hours as needed for up to 7 days for moderate pain (pain score 4-6), severe pain (pain score 7-10) or breakthrough pain.   polyethylene glycol powder 17 GM/SCOOP powder Commonly known as: GoodSense ClearLax MIX 17G (1 CAPFUL) INTO 8 OUNCES OF JUICE/WATER. DRINK ONCE DAILY AND MAY HAVE A SECOND DOSE DAILY AS NEEDED FOR CONSTIPATION. What changed:  how much to take how to take this when to take this additional instructions   rosuvastatin  20 MG tablet Commonly known as: CRESTOR  TAKE 1 TABLET DAILY What changed: when to take this   simethicone  40 MG/0.6ML drops Commonly known as:  Infants Simethicone  Take 0.6 mLs (40 mg total) by mouth 2 (two) times daily. After lunch and dinner. May give an additional 0.6mL with meals and at bedtime as needed for bloating and gas. What changed: See the new instructions.   sotalol  80 MG tablet Commonly known as: BETAPACE  TAKE 1 TABLET EVERY 12 HOURS   THERATEARS OP Place 1 drop into both eyes 4 (four) times daily.   timolol  0.25 % ophthalmic solution Commonly known as: TIMOPTIC  Place 1 drop into both eyes daily.        Follow-up Information     Skeet Juliene SAUNDERS, DO. Schedule an appointment as soon as possible for a visit in 1 month(s).   Specialty: Neurology Contact information: [address]  [address] [place] [address] [phone]         Kennyth Worth HERO, MD Follow up in 1 week(s).   Specialty: Family Medicine Contact information: [address] [place] [address] [phone]                Allergies  Allergen Reactions   Cymbalta  [Duloxetine  Hcl] Other (See Comments)    Unknown reaction    You were cared for by a hospitalist during your hospital stay. If you have any questions about your discharge medications or the care you received while you were in the hospital after you are discharged, you can call the unit and asked to speak with the hospitalist on call if the hospitalist that took care of you is not available. Once you are discharged, your primary care physician will handle any further medical issues. Please note that no refills for any discharge medications will be authorized once you are discharged, as it is imperative that you return to your primary care physician (or establish a relationship with a primary care physician if you do not have one) for your aftercare needs so that they can reassess your need for medications and monitor your lab values.  You were cared for by a hospitalist during your hospital stay. If you have any questions about your discharge medications or the care  you received while you were in the hospital after you are discharged, you can call the unit and asked to speak with the hospitalist on call if the hospitalist that took care of you is not available. Once you are discharged, your primary care physician will handle any further medical issues. Please note that NO REFILLS for any discharge medications will be authorized once you are discharged, as it is imperative that you return to your primary care physician (or establish a relationship with a primary care physician if you do not have one) for your aftercare needs so that they can reassess your need  for medications and monitor your lab values.  Please request your Prim.MD to go over all Hospital Tests and Procedure/Radiological results at the follow up, please get all Hospital records sent to your Prim MD by signing hospital release before you go home.  Get CBC, CMP, 2 view Chest X ray checked  by Primary MD during your next visit or SNF MD in 5-7 days ( we routinely change or add medications that can affect your baseline labs and fluid status, therefore we recommend that you get the mentioned basic workup next visit with your PCP, your PCP may decide not to get them or add new tests based on their clinical decision)  On your next visit with your primary care physician please Get Medicines reviewed and adjusted.  If you experience worsening of your admission symptoms, develop shortness of breath, life threatening emergency, suicidal or homicidal thoughts you must seek medical attention immediately by calling 911 or calling your MD immediately  if symptoms less severe.  You Must read complete instructions/literature along with all the possible adverse reactions/side effects for all the Medicines you take and that have been prescribed to you. Take any new Medicines after you have completely understood and accpet all the possible adverse reactions/side effects.   Do not drive, operate heavy machinery, perform  activities at heights, swimming or participation in water activities or provide baby sitting services if your were admitted for syncope or siezures until you have seen by Primary MD or a Neurologist and advised to do so again.  Do not drive when taking Pain medications.   Procedures/Studies: ECHOCARDIOGRAM COMPLETE Result Date: 03/30/2024    ECHOCARDIOGRAM REPORT   Patient Name:   Romeka Lindbloom Date of Exam: 03/29/2024 Medical Rec #:  [id number]     Height:       64.0 in Accession #:    [id number]    Weight:       113.3 lb Date of Birth:  1923/12/25     BSA:          1.537 m Patient Age:    89 years      BP:           140/68 mmHg Patient Gender: F             HR:           86 bpm. Exam Location:  Inpatient Procedure: 2D Echo, Cardiac Doppler and Color Doppler (Both Spectral and Color            Flow Doppler were utilized during procedure). Indications:    Stroke I63.9  History:        Patient has prior history of Echocardiogram examinations, most                 recent 09/20/2021. COPD, Arrythmias:Tachycardia; Risk                 Factors:Hypertension and Dyslipidemia.  Sonographer:    Thea Norlander RCS Referring Phys: [id number] DEVON SHAFER IMPRESSIONS  1. Moderate asymmetric hypertrophy. There is chordal SAM with intracavitary gradient up to 23 mmHG, due to hyperdynamic function and basal septal hypertrophy. Left ventricular ejection fraction, by estimation, is 70 to 75%. The left ventricle has hyperdynamic function. The left ventricle has no regional wall motion abnormalities. There is moderate asymmetric left ventricular hypertrophy of the basal-septal segment. Left ventricular diastolic parameters are consistent with Grade I diastolic dysfunction (impaired relaxation).  2. Right ventricular systolic function is normal. The right ventricular size  is normal. There is moderately elevated pulmonary artery systolic pressure. The estimated right ventricular systolic pressure is 57.8 mmHg.  3. The mitral valve is  degenerative. Mild mitral valve regurgitation. No evidence of mitral stenosis.  4. The tricuspid valve is abnormal. Tricuspid valve regurgitation is moderate.  5. The aortic valve is tricuspid. Aortic valve regurgitation is mild. Mild aortic valve stenosis. Aortic valve area, by VTI measures 1.60 cm. Aortic valve mean gradient measures 7.5 mmHg. Aortic valve Vmax measures 1.91 m/s.  6. The inferior vena cava is normal in size with <50% respiratory variability, suggesting right atrial pressure of 8 mmHg. Comparison(s): No significant change from prior study. FINDINGS  Left Ventricle: Moderate asymmetric hypertrophy. There is chordal SAM with intracavitary gradient up to 23 mmHG, due to hyperdynamic function and basal septal hypertrophy. Left ventricular ejection fraction, by estimation, is 70 to 75%. The left ventricle has hyperdynamic function. The left ventricle has no regional wall motion abnormalities. The left ventricular internal cavity size was normal in size. There is moderate asymmetric left ventricular hypertrophy of the basal-septal segment. Left ventricular diastolic parameters are consistent with Grade I diastolic dysfunction (impaired relaxation). Right Ventricle: The right ventricular size is normal. No increase in right ventricular wall thickness. Right ventricular systolic function is normal. There is moderately elevated pulmonary artery systolic pressure. The tricuspid regurgitant velocity is 3.53 m/s, and with an assumed right atrial pressure of 8 mmHg, the estimated right ventricular systolic pressure is 57.8 mmHg. Left Atrium: Left atrial size was normal in size. Right Atrium: Right atrial size was normal in size. Pericardium: There is no evidence of pericardial effusion. Mitral Valve: The mitral valve is degenerative in appearance. Mild to moderate mitral annular calcification. Mild mitral valve regurgitation. No evidence of mitral valve stenosis. Tricuspid Valve: The tricuspid valve is  abnormal. Tricuspid valve regurgitation is moderate . No evidence of tricuspid stenosis. Aortic Valve: The aortic valve is tricuspid. Aortic valve regurgitation is mild. Mild aortic stenosis is present. Aortic valve mean gradient measures 7.5 mmHg. Aortic valve peak gradient measures 14.6 mmHg. Aortic valve area, by VTI measures 1.60 cm. Pulmonic Valve: The pulmonic valve was grossly normal. Pulmonic valve regurgitation is mild. No evidence of pulmonic stenosis. Aorta: The aortic root and ascending aorta are structurally normal, with no evidence of dilitation. Venous: The inferior vena cava is normal in size with less than 50% respiratory variability, suggesting right atrial pressure of 8 mmHg. IAS/Shunts: The atrial septum is grossly normal.  LEFT VENTRICLE PLAX 2D LVIDd:         2.90 cm   Diastology LVIDs:         1.20 cm   LV e' medial:    6.53 cm/s LV PW:         0.90 cm   LV E/e' medial:  12.8 LV IVS:        1.40 cm   LV e' lateral:   8.92 cm/s LVOT diam:     1.70 cm   LV E/e' lateral: 9.4 LV SV:         59 LV SV Index:   38 LVOT Area:     2.27 cm  RIGHT VENTRICLE             IVC RV S prime:     14.70 cm/s  IVC diam: 2.00 cm TAPSE (M-mode): 2.2 cm LEFT ATRIUM             Index        RIGHT ATRIUM  Index LA diam:        3.30 cm 2.15 cm/m   RA Area:     9.70 cm LA Vol (A2C):   45.7 ml 29.74 ml/m  RA Volume:   17.40 ml 11.32 ml/m LA Vol (A4C):   26.9 ml 17.51 ml/m LA Biplane Vol: 35.1 ml 22.84 ml/m  AORTIC VALVE AV Area (Vmax):    1.64 cm AV Area (Vmean):   1.67 cm AV Area (VTI):     1.60 cm AV Vmax:           191.00 cm/s AV Vmean:          125.500 cm/s AV VTI:            0.368 m AV Peak Grad:      14.6 mmHg AV Mean Grad:      7.5 mmHg LVOT Vmax:         138.00 cm/s LVOT Vmean:        92.400 cm/s LVOT VTI:          0.259 m LVOT/AV VTI ratio: 0.70  AORTA Ao Root diam: 2.80 cm Ao Asc diam:  3.30 cm MITRAL VALVE                TRICUSPID VALVE MV Area (PHT): 2.73 cm     TR Peak grad:   49.8 mmHg MV  Decel Time: 278 msec     TR Vmax:        353.00 cm/s MV E velocity: 83.80 cm/s MV A velocity: 117.00 cm/s  SHUNTS MV E/A ratio:  0.72         Systemic VTI:  0.26 m                             Systemic Diam: 1.70 cm Darryle Decent MD Electronically signed by Darryle Decent MD Signature Date/Time: 03/30/2024/2:21:16 PM    Final    CT ANGIO HEAD NECK W WO CM Result Date: 03/29/2024 EXAM: CTA Head and Neck without and with Intravenous Contrast. CT Head without Contrast. CLINICAL HISTORY: Stroke/TIA, determine embolic source. TECHNIQUE: Axial CTA images of the head and neck performed with intravenous contrast. MIP reconstructed images were created and reviewed. Axial computed tomography images of the head/brain performed without intravenous contrast. Note: Per PQRS, the description of internal carotid artery percent stenosis, including 0 percent or normal exam, is based on [place] American Symptomatic Carotid Endarterectomy Trial (NASCET) criteria. Dose reduction technique was used including one or more of the following: automated exposure control, adjustment of mA and kV according to patient size, and/or iterative reconstruction. CONTRAST: 75mL (iohexol  (OMNIPAQUE ) 350 MG/ML injection 75 mL IOHEXOL  350 MG/ML SOLN) COMPARISON: 03/25/24 and 09/22/21 FINDINGS: CT HEAD: BRAIN: Mild chronic ischemic white matter changes. Mild volume loss. No acute intraparenchymal hemorrhage. No mass lesion. No CT evidence for acute territorial infarct. No midline shift or extra-axial collection. VENTRICLES: No hydrocephalus. ORBITS: The orbits are unremarkable. SINUSES AND MASTOIDS: Left sphenoid sinus opacification. CTA NECK: COMMON CAROTID ARTERIES: No significant stenosis. No dissection or occlusion. Calcific aortic atherosclerosis. INTERNAL CAROTID ARTERIES: Atherosclerotic calcifications of the cavernous and clinoid segments of the internal carotid arteries at the skull base. No hemodynamically significant stenosis by NASCET criteria.  VERTEBRAL ARTERIES: No significant stenosis. No dissection or occlusion. CTA HEAD: ANTERIOR CEREBRAL ARTERIES: No significant stenosis. No occlusion. No aneurysm. MIDDLE CEREBRAL ARTERIES: There is unchanged occlusion of the left MCA M2 branch (series 9 image 93).  Distal M2 branches are patent. POSTERIOR CEREBRAL ARTERIES: Atherosclerotic irregularity of both posterior cerebral arteries without high-grade stenosis. No occlusion. No aneurysm. BASILAR ARTERY: No significant stenosis. No occlusion. No aneurysm. OTHER: Heterogeneous and enlarged thyroid  gland that extends into the upper mediastinum. SOFT TISSUES: No acute finding. No masses or lymphadenopathy. IMPRESSION: 1. Unchanged occlusion of a left MCA M2 branch. Distal M2 branches are patent. 2. Atherosclerotic irregularity of both posterior cerebral arteries without high-grade stenosis. 3. Calcific atherosclerosis of both carotid bifurcations without hemodynamically significant stenosis by NASCET criteria. 4. Atherosclerotic calcifications of the cavernous and clinoid segments of the internal carotid arteries at the skull base. Electronically signed by: Franky Stanford MD 03/29/2024 03:31 AM EDT RP Workstation: [id number]   MR BRAIN WO CONTRAST Result Date: 03/28/2024 CLINICAL DATA:  89 year old female with altered mental status. Recent fall. EXAM: MRI HEAD WITHOUT CONTRAST TECHNIQUE: Multiplanar, multiecho pulse sequences of the brain and surrounding structures were obtained without intravenous contrast. COMPARISON:  Head CT 03/25/2024.  Brain MRI 01/11/2023. FINDINGS: Brain: Patchy restricted diffusion anterior left frontal lobe central and subcortical white matter (series 5, image 81). Associated T2 and FLAIR hyperintensity. No hemorrhage or mass effect. No other diffusion restriction. Chronic signal abnormality in the cerebellum, encephalomalacia and chronic microhemorrhage on the left (series 12, image 15). Vague chronic T2 hyperintensity in the right  hemisphere at the junction of the cerebellar peduncle, perhaps Wallerian degeneration), stable. Other patchy cerebral white matter T2 and FLAIR hyperintensity is stable and generally mild for age. No chronic cerebral cortical encephalomalacia identified. And no other chronic cerebral blood products on SWI. Stable cerebral volume. No midline shift, mass effect, evidence of mass lesion, ventriculomegaly, or acute intracranial hemorrhage. Cervicomedullary junction and pituitary are within normal limits. Vascular: Major intracranial vascular flow voids are stable since last year. Skull and upper cervical spine: Negative. Visualized bone marrow signal is within normal limits. Sinuses/Orbits: Stable. Other: Visible internal auditory structures appear normal. IMPRESSION: 1. Positive for patchy acute infarcts in the anterior Left frontal lobe white matter, ACA/MCA watershed area. No associated hemorrhage or mass effect. 2. No other acute intracranial abnormality. Chronic signal changes in the bilateral cerebellum suggestive of combined chronic hemorrhage, encephalomalacia, Wallerian degeneration. Electronically Signed   By: VEAR Hurst M.D.   On: 03/28/2024 11:46   DG FEMUR MIN 2 VIEWS LEFT Result Date: 03/27/2024 CLINICAL DATA:  Left hip ORIF, postoperative examination EXAM: LEFT FEMUR 2 VIEWS COMPARISON:  None Available. FINDINGS: Two view radiograph left femur demonstrates surgical changes of left hip ORIF with a intramedullary rod with proximal and distal interlocking screws. Fracture fragments are in near anatomic alignment. No unexpected fracture or dislocation. Mild left hip degenerative arthritis. Avulsion of the lesser trochanter again noted with moderate distraction of fracture fragments by up to 1 cm. Gas and surgical staples are seen within the lateral soft tissues proximally and distally. IMPRESSION: 1. Left hip ORIF with fracture fragments in near anatomic alignment. Electronically Signed   By: Dorethia Molt  M.D.   On: 03/27/2024 01:05   DG FEMUR MIN 2 VIEWS LEFT Result Date: 03/26/2024 CLINICAL DATA:  Elective surgery. EXAM: LEFT FEMUR 2 VIEWS COMPARISON:  Hip radiograph yesterday FINDINGS: Six fluoroscopic spot views of the femur submitted from the operating room. Femoral intramedullary nail with trans trochanteric and distal locking screw fixation traverse proximal femur fracture. Fluoroscopy time 126.5 seconds. Dose 11.09 mGy IMPRESSION: Intraoperative fluoroscopy during proximal femur fracture ORIF. Electronically Signed   By: Andrea Gasman M.D.   On: 03/26/2024  20:46   DG C-Arm 1-60 Min-No Report Result Date: 03/26/2024 Fluoroscopy was utilized by the requesting physician.  No radiographic interpretation.   DG C-Arm 1-60 Min-No Report Result Date: 03/26/2024 Fluoroscopy was utilized by the requesting physician.  No radiographic interpretation.   DG FEMUR MIN 2 VIEWS LEFT Result Date: 03/26/2024 CLINICAL DATA:  Known hip fracture EXAM: LEFT FEMUR 2 VIEWS COMPARISON:  03/25/2024 FINDINGS: Distal femur shows no acute fracture. Mild degenerative changes of the knee joint are seen. No joint effusion is seen. IMPRESSION: No acute distal femoral abnormality is noted. Electronically Signed   By: Oneil Devonshire M.D.   On: 03/26/2024 03:42   CT Cervical Spine Wo Contrast Result Date: 03/25/2024 CLINICAL DATA:  Polytrauma, blunt.  Fall EXAM: CT HEAD WITHOUT CONTRAST CT CERVICAL SPINE WITHOUT CONTRAST TECHNIQUE: Multidetector CT imaging of the head and cervical spine was performed following the standard protocol without intravenous contrast. Multiplanar CT image reconstructions of the cervical spine were also generated. RADIATION DOSE REDUCTION: This exam was performed according to the departmental dose-optimization program which includes automated exposure control, adjustment of the mA and/or kV according to patient size and/or use of iterative reconstruction technique. COMPARISON:  None Available. FINDINGS:  CT HEAD FINDINGS Brain: Cerebral ventricle sizes are concordant with the degree of cerebral volume loss. Patchy and confluent areas of decreased attenuation are noted throughout the deep and periventricular white matter of the cerebral hemispheres bilaterally, compatible with chronic microvascular ischemic disease. No evidence of large-territorial acute infarction. No parenchymal hemorrhage. No mass lesion. No extra-axial collection. No mass effect or midline shift. No hydrocephalus. Basilar cisterns are patent. Vascular: No hyperdense vessel. Atherosclerotic calcifications are present within the cavernous internal carotid and vertebral arteries. Skull: No acute fracture or focal lesion. Sinuses/Orbits: Paranasal sinuses and mastoid air cells are clear. Bilateral lens replacement. Otherwise the orbits are unremarkable. Other: None. CT CERVICAL SPINE FINDINGS Alignment: Grade 1 anterolisthesis of C5 on C6. Skull base and vertebrae: Multilevel moderate degenerative change of the spine. No acute fracture. No aggressive appearing focal osseous lesion or focal pathologic process. Soft tissues and spinal canal: No prevertebral fluid or swelling. No visible canal hematoma. Upper chest: Biapical pleural/pulmonary scarring. Paraseptal emphysematous changes. Other: Heterogeneous enlarged thyroid  gland. Atherosclerotic plaque of the carotid arteries. IMPRESSION: 1. No acute intracranial abnormality. 2. No acute displaced fracture or traumatic listhesis of the cervical spine. 3.  Aortic aneurysm NOS (ICD10-I71.9). 4. Multinodular goiter. In the setting of significant comorbidities or limited life expectancy, no follow-up recommended (ref: J Am Coll Radiol. 2015 Feb;12(2): 143-50). Electronically Signed   By: Morgane  Naveau M.D.   On: 03/25/2024 18:40   CT Head Wo Contrast Result Date: 03/25/2024 CLINICAL DATA:  Polytrauma, blunt.  Fall EXAM: CT HEAD WITHOUT CONTRAST CT CERVICAL SPINE WITHOUT CONTRAST TECHNIQUE:  Multidetector CT imaging of the head and cervical spine was performed following the standard protocol without intravenous contrast. Multiplanar CT image reconstructions of the cervical spine were also generated. RADIATION DOSE REDUCTION: This exam was performed according to the departmental dose-optimization program which includes automated exposure control, adjustment of the mA and/or kV according to patient size and/or use of iterative reconstruction technique. COMPARISON:  None Available. FINDINGS: CT HEAD FINDINGS Brain: Cerebral ventricle sizes are concordant with the degree of cerebral volume loss. Patchy and confluent areas of decreased attenuation are noted throughout the deep and periventricular white matter of the cerebral hemispheres bilaterally, compatible with chronic microvascular ischemic disease. No evidence of large-territorial acute infarction. No parenchymal hemorrhage. No  mass lesion. No extra-axial collection. No mass effect or midline shift. No hydrocephalus. Basilar cisterns are patent. Vascular: No hyperdense vessel. Atherosclerotic calcifications are present within the cavernous internal carotid and vertebral arteries. Skull: No acute fracture or focal lesion. Sinuses/Orbits: Paranasal sinuses and mastoid air cells are clear. Bilateral lens replacement. Otherwise the orbits are unremarkable. Other: None. CT CERVICAL SPINE FINDINGS Alignment: Grade 1 anterolisthesis of C5 on C6. Skull base and vertebrae: Multilevel moderate degenerative change of the spine. No acute fracture. No aggressive appearing focal osseous lesion or focal pathologic process. Soft tissues and spinal canal: No prevertebral fluid or swelling. No visible canal hematoma. Upper chest: Biapical pleural/pulmonary scarring. Paraseptal emphysematous changes. Other: Heterogeneous enlarged thyroid  gland. Atherosclerotic plaque of the carotid arteries. IMPRESSION: 1. No acute intracranial abnormality. 2. No acute displaced fracture  or traumatic listhesis of the cervical spine. 3.  Aortic aneurysm NOS (ICD10-I71.9). 4. Multinodular goiter. In the setting of significant comorbidities or limited life expectancy, no follow-up recommended (ref: J Am Coll Radiol. 2015 Feb;12(2): 143-50). Electronically Signed   By: Morgane  Naveau M.D.   On: 03/25/2024 18:40   DG Hip Unilat With Pelvis 2-3 Views Left Result Date: 03/25/2024 CLINICAL DATA:  pain EXAM: DG HIP (WITH OR WITHOUT PELVIS) 2-3V LEFT COMPARISON:  None Available. FINDINGS: Acute displaced and comminuted left intertrochanteric femoral fracture. No acute displaced fracture or dislocation of the right hip. No acute displaced fracture or diastasis of the bones of the pelvis. Coarsely calcified pericentimeter lesion along the pelvis may represent a fibroid. there is no evidence of arthropathy or other focal bone abnormality. IMPRESSION: Acute displaced and comminuted left intertrochanteric femoral fracture. Electronically Signed   By: Morgane  Naveau M.D.   On: 03/25/2024 18:32     The results of significant diagnostics from this hospitalization (including imaging, microbiology, ancillary and laboratory) are listed below for reference.     Microbiology: Recent Results (from the past 240 hours)  Surgical PCR screen     Status: None   Collection Time: 03/26/24  3:52 AM   Specimen: Nasal Mucosa; Nasal Swab  Result Value Ref Range Status   MRSA, PCR NEGATIVE NEGATIVE Final   Staphylococcus aureus NEGATIVE NEGATIVE Final    Comment: (NOTE) The Xpert SA Assay (FDA approved for NASAL specimens in patients 4 years of age and older), is one component of a comprehensive surveillance program. It is not intended to diagnose infection nor to guide or monitor treatment. Performed at [facility], [address] [place], [place] [address]      Labs: BNP (last 3 results) No results for input(s): BNP in the last 8760 hours. Basic Metabolic Panel: Recent Labs  Lab  03/27/24 0537 03/28/24 0637 03/29/24 0738 03/30/24 0658 03/31/24 0508 04/01/24 0632  NA 132* 130* 131* 134* 135 134*  K 3.5 4.0 4.1 3.9 4.3 4.1  CL 102 99 101 101 102 99  CO2 24 26 23 27 26 28   GLUCOSE 157* 133* 165* 118* 113* 137*  BUN 11 12 9 16 15 23   CREATININE 0.63 0.61 0.51 0.59 0.52 0.56  CALCIUM  9.0 9.1 9.3 9.2 9.3 9.5  MG 1.7 1.8 2.1 2.1 2.1 2.1  PHOS 2.2*  --   --   --   --   --    Liver Function Tests: Recent Labs  Lab 03/25/24 1751 03/26/24 0413  AST 19 19  ALT 11 11  ALKPHOS 97 92  BILITOT 0.3 0.6  PROT 6.2* 5.9*  ALBUMIN  3.3* 3.1*  No results for input(s): LIPASE, AMYLASE in the last 168 hours. Recent Labs  Lab 03/28/24 0854  AMMONIA <13   CBC: Recent Labs  Lab 03/25/24 1751 03/26/24 0413 03/27/24 0537 03/28/24 9362 03/29/24 0957 03/30/24 0658 03/31/24 0508 04/01/24 0632  WBC 8.2 11.2*   < > 10.8* 9.3 7.8 7.3 10.2  NEUTROABS 5.7 8.7*  --   --   --   --   --   --   HGB 11.8* 10.8*   < > 8.1* 8.0* 7.6* 7.6* 8.1*  HCT 37.2 32.4*   < > 23.5* 24.4* 23.4* 22.8* 25.2*  MCV 99.2 94.5   < > 91.1 93.5 94.7 94.6 95.8  PLT 217 216   < > 122* 177 201 213 259   < > = values in this interval not displayed.   Cardiac Enzymes: No results for input(s): CKTOTAL, CKMB, CKMBINDEX, TROPONINI in the last 168 hours. BNP: Invalid input(s): POCBNP CBG: No results for input(s): GLUCAP in the last 168 hours. D-Dimer No results for input(s): DDIMER in the last 72 hours. Hgb A1c No results for input(s): HGBA1C in the last 72 hours. Lipid Profile No results for input(s): CHOL, HDL, LDLCALC, TRIG, CHOLHDL, LDLDIRECT in the last 72 hours. Thyroid  function studies No results for input(s): TSH, T4TOTAL, T3FREE, THYROIDAB in the last 72 hours.  Invalid input(s): FREET3 Anemia work up No results for input(s): VITAMINB12, FOLATE, FERRITIN, TIBC, IRON, RETICCTPCT in the last 72 hours. Urinalysis    Component  Value Date/Time   COLORURINE YELLOW 03/28/2024 1016   APPEARANCEUR HAZY (A) 03/28/2024 1016   LABSPEC 1.017 03/28/2024 1016   PHURINE 5.0 03/28/2024 1016   GLUCOSEU NEGATIVE 03/28/2024 1016   GLUCOSEU NEGATIVE 11/18/2020 1218   HGBUR MODERATE (A) 03/28/2024 1016   BILIRUBINUR NEGATIVE 03/28/2024 1016   BILIRUBINUR NEG 11/24/2022 1353   KETONESUR NEGATIVE 03/28/2024 1016   PROTEINUR 30 (A) 03/28/2024 1016   UROBILINOGEN 0.2 11/24/2022 1353   UROBILINOGEN 0.2 11/18/2020 1218   NITRITE NEGATIVE 03/28/2024 1016   LEUKOCYTESUR TRACE (A) 03/28/2024 1016   Sepsis Labs Recent Labs  Lab 03/29/24 0957 03/30/24 0658 03/31/24 0508 04/01/24 0632  WBC 9.3 7.8 7.3 10.2   Microbiology Recent Results (from the past 240 hours)  Surgical PCR screen     Status: None   Collection Time: 03/26/24  3:52 AM   Specimen: Nasal Mucosa; Nasal Swab  Result Value Ref Range Status   MRSA, PCR NEGATIVE NEGATIVE Final   Staphylococcus aureus NEGATIVE NEGATIVE Final    Comment: (NOTE) The Xpert SA Assay (FDA approved for NASAL specimens in patients 76 years of age and older), is one component of a comprehensive surveillance program. It is not intended to diagnose infection nor to guide or monitor treatment. Performed at [facility], [address] [place], [place] [address]      Time coordinating discharge:  I have spent 35 minutes face to face with the patient and on the ward discussing the patients care, assessment, plan and disposition with other care givers. >50% of the time was devoted counseling the patient about the risks and benefits of treatment/Discharge disposition and coordinating care.   SIGNED:   Burgess JAYSON Dare, MD  [facility] 04/01/2024, 12:14 PM   If 7PM-7AM, please contact night-coverage

## 2024-04-01 NOTE — TOC Transition Note (Signed)
 Transition of Care [facility]) - Discharge Note   Patient Details  Name: Linda [name] MRN: [id number] Date of Birth: 1924-04-15  Transition of [facility]) CM/SW Contact:  Bridget Cordella Simmonds, LCSW Phone Number: 04/01/2024, 1:51 PM   Clinical Narrative:   Pt discharging to [facility], room 101P.  RN call report to [phone].  Per SNF request, PTAR has been scheduled for 3pm.    12: CSW confirmed with [facility] that they can receive pt today.    Final next level of care: Skilled Nursing Facility Barriers to Discharge: Barriers Resolved   Patient Goals and CMS Choice Patient states their goals for this hospitalization and ongoing recovery are:: improvement is basic mobility to get a clear picture of what her abilities are CMS Medicare.gov Compare Post Acute Care list provided to:: Patient Represenative (must comment) (Patient's daughtee-Cheryl)        Discharge Placement              Patient chooses bed at: [facility] Patient to be transferred to facility by: ptar Name of family member notified: daughter Channing in room Patient and family notified of of transfer: 04/01/24  Discharge Plan and Services Additional resources added to the After Visit Summary for   In-house Referral: Clinical Social Work   Post Acute Care Choice: Skilled Nursing Facility                               Social Drivers of Health (SDOH) Interventions SDOH Screenings   Food Insecurity: No Food Insecurity (03/25/2024)  Housing: Low Risk  (03/25/2024)  Transportation Needs: No Transportation Needs (03/25/2024)  Utilities: Not At Risk (03/25/2024)  Alcohol  Screen: Low Risk  (12/05/2023)  Depression (PHQ2-9): Low Risk  (12/05/2023)  Financial Resource Strain: Low Risk  (12/05/2023)  Physical Activity: Insufficiently Active (12/05/2023)  Social Connections: Socially Isolated (03/25/2024)  Stress: No Stress Concern Present (12/05/2023)  Tobacco Use: Medium Risk (03/26/2024)  Health Literacy:  Adequate Health Literacy (12/05/2023)     Readmission Risk Interventions     No data to display

## 2024-04-01 NOTE — Progress Notes (Addendum)
 This RN [employer] 2 times.  Both times a call back number was left.  No report has been given.  Patient and family excited for discharge.  All Questions answered.  Report was called.  Patient ready for DC

## 2024-04-01 NOTE — Progress Notes (Signed)
   Palliative Medicine Inpatient Follow Up Note HPI: 89 y.o. female with medical history significant for COPD, essential hypertension, hyperlipidemia, L hip fracture, fall(s), previous stroke. The PMT has been asked to support additional goals of care in the setting of an acute CVA.   Today's Discussion 04/01/2024  *Please note that this is a verbal dictation therefore any spelling or grammatical errors are due to the Dragon Medical One system interpretation.  Chart reviewed inclusive of vital signs, progress notes, laboratory results, and diagnostic images.   I met this morning with Domingue and her daughter, Channing at bedside. Channing is feeding Mazey her breakfast which she appears to be doing well with.   We discussed Deeandra's present health state and how she did fairly with the physical therapy team yesterday. We reviewed the plan moving forward inclusive of Denetra transitioning to skilled nursing. We reviewed that at this time, patients daughter feels this is the best option to see if Brionna can make improvements.   Channing expresses concern(s) if Lamara appears distressed at [facility]. I shared at anytime if Channing identifies distress then changing course is best and transitioning to [facility] with hospice is possible. She too, confirmed this with the facility.   Questions and concerns addressed/Palliative Support Provided.   Objective Assessment: Vital Signs Vitals:   04/01/24 0541 04/01/24 0737  BP: (!) 143/50 (!) 150/50  Pulse: 67 68  Resp: 16 18  Temp: 98.2 F (36.8 C) 98.3 F (36.8 C)  SpO2: 94% 94%    Intake/Output Summary (Last 24 hours) at 04/01/2024 1053 Last data filed at 04/01/2024 9052 Gross per 24 hour  Intake 360 ml  Output 900 ml  Net -540 ml   Last Weight  Most recent update: 03/31/2024  5:03 AM    Weight  57.5 kg (126 lb 12.2 oz)            Gen: Elderly Caucasian female chronically ill in appearance HEENT: Dry mucous membranes CV: Regular rate  and rhythm PULM: On RA, breathing is even and nonlabored ABD: soft/nontender EXT: Left lower extremity bandage at hip, no edema Neuro: Aware of self  SUMMARY OF RECOMMENDATIONS   DNAR/DNI   A gold DNR form is completed and in the front of the chart   Plan for Mellie to transition to [facility] --> if she does not thrive there she would transition back to [facility] with hospice care   Goals at this time are clear, we will remain peripheral though can be re-involved if the need(s) arise ______________________________________________________________________________________ Rosaline Becton [facility] Palliative Medicine Team Team Cell Phone: [phone] Please utilize secure chat with additional questions, if there is no response within 30 minutes please call the above phone number  Billing based on MDM: Moderate   Palliative Medicine Team providers are available by phone from 7am to 7pm daily and can be reached through the team cell phone.  Should this patient require assistance outside of these hours, please call the patient's attending physician.

## 2024-04-03 ENCOUNTER — Encounter (HOSPITAL_BASED_OUTPATIENT_CLINIC_OR_DEPARTMENT_OTHER): Payer: Self-pay

## 2024-04-08 ENCOUNTER — Ambulatory Visit (HOSPITAL_BASED_OUTPATIENT_CLINIC_OR_DEPARTMENT_OTHER): Admitting: Cardiology

## 2024-04-16 ENCOUNTER — Other Ambulatory Visit (INDEPENDENT_AMBULATORY_CARE_PROVIDER_SITE_OTHER): Payer: Self-pay

## 2024-04-16 ENCOUNTER — Ambulatory Visit (INDEPENDENT_AMBULATORY_CARE_PROVIDER_SITE_OTHER): Admitting: Orthopedic Surgery

## 2024-04-16 DIAGNOSIS — S72142A Displaced intertrochanteric fracture of left femur, initial encounter for closed fracture: Secondary | ICD-10-CM | POA: Diagnosis not present

## 2024-04-16 NOTE — Progress Notes (Signed)
 Orthopedic Surgery Post-operative Office Visit  Procedure: left pertrochanteric femur fracture cephalomedullary rodding Date of Surgery: 03/26/2024 (~2 weeks post-op)  Assessment: Patient is a 89 y.o. who is still having hip pain but has slowly been mobilizing better with PT   Plan: -Operative plans complete -Weight bearing as tolerated -Staples removed today in office -Okay to let soap/water run over incision but do not submerge -DVT ppx: ASA 81mg  BID -Pain management: weaning oxycodone  -Return to office in 4 weeks, x-rays needed at next visit: AP/lateral left femur  ___________________________________________________________________________   Subjective: Patient's hip pain is slightly better since she was in the hospital.  She was discharged to [facility].  She has been working with physical therapy every day.  She has been making progress with them.  She now can stand for 2 minutes and has been walking short distances roughly 10 steps.  This is more than she was doing in the hospital.  She has not noticed any redness or drainage around her incisions.  Objective:  General: no acute distress, appropriate affect Neurologic: alert, answering questions appropriately, following commands Respiratory: unlabored breathing on room air Skin: incisions are well-approximated with no erythema, induration, active/expressible drainage  MSK (spine):  -Strength exam      Left  Right  EHL    5/5  5/5 TA    5/5  5/5 GSC    5/5  5/5 Knee extension  5/5  5/5 Hip flexion   5/5  5/5  -Sensory exam    Sensation intact to light touch in L3-S1 nerve distributions of bilateral lower extremities  Imaging: XRs of the left femur from 04/16/2024 were independently reviewed and interpreted, showing a well reduced left pertrochanteric femur fracture.  There is a cephalomedullary rod in place.  The lag screws are well centered within the femoral head.  No lucency is seen around the distal interlocking  screws.  Alignment has been maintained since immediate postoperative films.  There is an associated lesser trochanter fracture which is displaced.   Patient name: Linda [name] Patient MRN: [id number] Date of visit: 04/16/24

## 2024-04-17 ENCOUNTER — Ambulatory Visit: Admitting: Family Medicine

## 2024-04-27 ENCOUNTER — Encounter: Payer: Self-pay | Admitting: Family Medicine

## 2024-04-28 NOTE — Telephone Encounter (Signed)
 See forms for scanning on pt chart

## 2024-05-19 ENCOUNTER — Other Ambulatory Visit: Payer: Self-pay

## 2024-05-19 ENCOUNTER — Ambulatory Visit: Admitting: Orthopedic Surgery

## 2024-05-19 DIAGNOSIS — S72142A Displaced intertrochanteric fracture of left femur, initial encounter for closed fracture: Secondary | ICD-10-CM | POA: Diagnosis not present

## 2024-05-19 NOTE — Progress Notes (Signed)
 Orthopedic Surgery Post-operative Office Visit   Procedure: left pertrochanteric femur fracture cephalomedullary rodding Date of Surgery: 03/26/2024 (~6 weeks post-op)   Assessment: Patient is a 89 y.o. who has noticed improvement in her function since she was last seen.  Still gets pain with activity but it is better than last visit     Plan: -Operative plans complete -Weight bearing as tolerated -Okay to submerge wounds at this point -DVT ppx: none -Pain management: tylenol  as needed -Return to office in 6 weeks, x-rays needed at next visit: AP/lateral left femur   ___________________________________________________________________________     Subjective: Patient and daughter have noticed improvement in her function since she was last seen in the office.  She is now doing transfers on her own.  She is still walking shorter distances.  Previously, she was walking about 10 steps but now she can walk the halls at [facility].  She said she still has pain if she is doing a lot of activity.  She has not noticed any redness or drainage around her incisions.   Objective:   General: no acute distress, appropriate affect Neurologic: alert, answering questions appropriately, following commands Respiratory: unlabored breathing on room air Skin: incisions are well healed with no erythema, induration, active/expressible drainage   MSK (LLE): some pain with internal rotation of the hip, otherwise no pain through remainder of range of motion, EHL/TA/GSC intact, sensation intact to light touch in sural/saphenous/deep peroneal/superficial peroneal/tibial nerve distributions, foot warm and well-perfused  Imaging: XRs of the left femur from 05/19/2024 were independently reviewed and interpreted, showing a left peritrochanteric femur fracture.  Lesser trochanter fracture seen as well with displacement medially.  Alignment appears unchanged since last films on 04/16/2024.  No lucency seen around the rod,  lag screws, or interlocking screws.  No new fracture seen.     Patient name: Linda [name] Patient MRN: [id number] Date of visit: 05/19/24

## 2024-06-16 ENCOUNTER — Encounter: Payer: Self-pay | Admitting: Radiology

## 2024-06-18 ENCOUNTER — Other Ambulatory Visit: Payer: Self-pay | Admitting: Family Medicine

## 2024-06-30 ENCOUNTER — Encounter: Admitting: Orthopedic Surgery

## 2024-07-16 ENCOUNTER — Ambulatory Visit (HOSPITAL_BASED_OUTPATIENT_CLINIC_OR_DEPARTMENT_OTHER): Admitting: Cardiology

## 2024-12-11 ENCOUNTER — Ambulatory Visit
